# Patient Record
Sex: Male | Born: 1950 | Race: White | Hispanic: No | Smoking: Former smoker
Health system: Southern US, Community
[De-identification: ages and names within clinical notes are randomized; demographics above are authoritative.]

## PROBLEM LIST (undated history)

## (undated) DIAGNOSIS — I4510 Unspecified right bundle-branch block: Secondary | ICD-10-CM

## (undated) DIAGNOSIS — F419 Anxiety disorder, unspecified: Secondary | ICD-10-CM

## (undated) DIAGNOSIS — I2510 Atherosclerotic heart disease of native coronary artery without angina pectoris: Secondary | ICD-10-CM

## (undated) DIAGNOSIS — I252 Old myocardial infarction: Secondary | ICD-10-CM

## (undated) DIAGNOSIS — G4730 Sleep apnea, unspecified: Secondary | ICD-10-CM

## (undated) DIAGNOSIS — I219 Acute myocardial infarction, unspecified: Secondary | ICD-10-CM

## (undated) DIAGNOSIS — M7989 Other specified soft tissue disorders: Secondary | ICD-10-CM

## (undated) DIAGNOSIS — R0602 Shortness of breath: Secondary | ICD-10-CM

## (undated) DIAGNOSIS — E669 Obesity, unspecified: Secondary | ICD-10-CM

## (undated) DIAGNOSIS — I480 Paroxysmal atrial fibrillation: Secondary | ICD-10-CM

## (undated) DIAGNOSIS — F988 Other specified behavioral and emotional disorders with onset usually occurring in childhood and adolescence: Secondary | ICD-10-CM

## (undated) DIAGNOSIS — R0600 Dyspnea, unspecified: Secondary | ICD-10-CM

## (undated) DIAGNOSIS — R638 Other symptoms and signs concerning food and fluid intake: Secondary | ICD-10-CM

## (undated) DIAGNOSIS — I4891 Unspecified atrial fibrillation: Secondary | ICD-10-CM

## (undated) DIAGNOSIS — G4733 Obstructive sleep apnea (adult) (pediatric): Secondary | ICD-10-CM

## (undated) DIAGNOSIS — K219 Gastro-esophageal reflux disease without esophagitis: Secondary | ICD-10-CM

## (undated) DIAGNOSIS — M1990 Unspecified osteoarthritis, unspecified site: Secondary | ICD-10-CM

## (undated) DIAGNOSIS — I10 Essential (primary) hypertension: Secondary | ICD-10-CM

## (undated) DIAGNOSIS — R633 Feeding difficulties: Secondary | ICD-10-CM

## (undated) DIAGNOSIS — E119 Type 2 diabetes mellitus without complications: Secondary | ICD-10-CM

## (undated) DIAGNOSIS — K5900 Constipation, unspecified: Secondary | ICD-10-CM

## (undated) HISTORY — DX: Other specified soft tissue disorders: M79.89

## (undated) HISTORY — DX: Old myocardial infarction: I25.2

## (undated) HISTORY — DX: Unspecified right bundle-branch block: I45.10

## (undated) HISTORY — DX: Paroxysmal atrial fibrillation: I48.0

## (undated) HISTORY — DX: Obesity, unspecified: E66.9

## (undated) HISTORY — DX: Other specified behavioral and emotional disorders with onset usually occurring in childhood and adolescence: F98.8

## (undated) HISTORY — DX: Feeding difficulties: R63.3

## (undated) HISTORY — DX: Shortness of breath: R06.02

## (undated) HISTORY — DX: Constipation, unspecified: K59.00

## (undated) HISTORY — PX: CARDIAC CATHETERIZATION: SHX172

## (undated) HISTORY — DX: Obstructive sleep apnea (adult) (pediatric): G47.33

## (undated) HISTORY — PX: TONSILLECTOMY: SUR1361

## (undated) HISTORY — DX: Other symptoms and signs concerning food and fluid intake: R63.8

## (undated) HISTORY — DX: Essential (primary) hypertension: I10

## (undated) HISTORY — DX: Sleep apnea, unspecified: G47.30

---

## 2000-08-25 ENCOUNTER — Encounter: Payer: Self-pay | Admitting: Emergency Medicine

## 2000-08-25 ENCOUNTER — Emergency Department (HOSPITAL_COMMUNITY): Admission: EM | Admit: 2000-08-25 | Discharge: 2000-08-25 | Payer: Self-pay | Admitting: Emergency Medicine

## 2003-08-09 ENCOUNTER — Encounter: Admission: RE | Admit: 2003-08-09 | Discharge: 2003-08-09 | Payer: Self-pay | Admitting: Internal Medicine

## 2005-08-01 ENCOUNTER — Emergency Department (HOSPITAL_COMMUNITY): Admission: EM | Admit: 2005-08-01 | Discharge: 2005-08-01 | Payer: Self-pay | Admitting: Emergency Medicine

## 2005-09-21 HISTORY — PX: NM MYOVIEW LTD: HXRAD82

## 2007-04-02 ENCOUNTER — Emergency Department (HOSPITAL_COMMUNITY): Admission: EM | Admit: 2007-04-02 | Discharge: 2007-04-02 | Payer: Self-pay | Admitting: Emergency Medicine

## 2008-03-02 ENCOUNTER — Emergency Department (HOSPITAL_COMMUNITY): Admission: EM | Admit: 2008-03-02 | Discharge: 2008-03-02 | Payer: Self-pay | Admitting: Emergency Medicine

## 2008-09-21 HISTORY — PX: DOPPLER ECHOCARDIOGRAPHY: SHX263

## 2009-10-17 ENCOUNTER — Ambulatory Visit: Payer: Self-pay | Admitting: Family Medicine

## 2009-10-17 DIAGNOSIS — E669 Obesity, unspecified: Secondary | ICD-10-CM

## 2010-05-28 ENCOUNTER — Emergency Department (HOSPITAL_COMMUNITY): Admission: EM | Admit: 2010-05-28 | Discharge: 2010-05-28 | Payer: Self-pay | Admitting: Emergency Medicine

## 2010-10-23 NOTE — Assessment & Plan Note (Signed)
Summary: NEW PT TO SEE DR SYKES/[facility]   Vital Signs:  Patient profile:   60 year old male Height:      75 inches Weight:      308.3 pounds BMI:     38.67  Vitals Entered By: Wyona Almas PHD (October 17, 2009 2:10 PM)  History of Present Illness: Assessment: Spent at least 60 min w/ pt.  Usual eating pattern includes bkfast, lunch, and dinner, and frequent snacks.  Travels a lot, eats out a lot, and has an erratic schedule.  Rodman is worried about his BP, which has been very high recently, but he did not start his amlodipine Rx b/c he was concerned re. too many med's.  BP log showed SBPs in the 180s.  Usual daily intake includes 2-3 c green tea, 0-12 oz yogurt, and 10-12 light beers.  Hazael has never slept more than 4-5 hr/night, although he can fall asleep for as much as 2 hr some mornings after going out for breakfast.  Favorite foods include meat, cheese, and eggs.  Portion control is identified as problematic, as is frequent eating, i.e., eating from stress and/or boredom.  Demarrion is trying to work exercise into his week:  30 min on stationery bike and 15 min weights 3 X wk.    Nutrition Diagnosis:  Physical inactivity (NB-2.1) related to as lack of priority evidenced by exercise only recently begun, and maximal 45 min 3 X wk.  Excessive biioactive substance (alcohol) intake related to night-time beer drinking as evidenced by 10-12 beers/night.  Excessive energy intake (NI-1.5) related to beer intake and portion sizes as evidenced by 10-12 beers/night and self-reported large portions of numerous foods.    Intervention: See Ptaient Instructions.   Monitoring/Eval: [place] will call/email with progress report, and is scheduled for f/u appt in [facility] on Feb 17.      Other Orders: [facility]- Est Level  3 ([id number])  Patient Instructions: 1)  Start your amlodipine Rx!   2)  Work on increasing your sleep time: Aim for 1/2 hour earlier bedtime each week.  If you wake up early, then do your best  to stay in bed until at least 6 AM each day.  3)  Google "sleep techniques," and try some of them.    4)  Wean yourself down to two beers per night: Establish a plan for this.   5)  Eat at least 3 meals and 1-2 snacks per day. No more than 5 hours between eating.  6)  Breakfast should include a significant amount of protein and adequate calories.  (Protein foods include meat, fish, poultry, eggs, dairy, soy foods, and beans.)   7)  Continue to log your food intake, AND REVIEW REGULARLY.   8)  Follow-up:  Email or call progress report:    [email or web address] / [phone].

## 2014-01-29 DIAGNOSIS — E119 Type 2 diabetes mellitus without complications: Secondary | ICD-10-CM | POA: Diagnosis not present

## 2014-01-29 DIAGNOSIS — Z125 Encounter for screening for malignant neoplasm of prostate: Secondary | ICD-10-CM | POA: Diagnosis not present

## 2014-01-29 DIAGNOSIS — I10 Essential (primary) hypertension: Secondary | ICD-10-CM | POA: Diagnosis not present

## 2014-01-29 DIAGNOSIS — E782 Mixed hyperlipidemia: Secondary | ICD-10-CM | POA: Diagnosis not present

## 2014-02-05 DIAGNOSIS — E782 Mixed hyperlipidemia: Secondary | ICD-10-CM | POA: Diagnosis not present

## 2014-02-05 DIAGNOSIS — Z0000 Encounter for general adult medical examination without abnormal findings: Secondary | ICD-10-CM | POA: Diagnosis not present

## 2014-02-05 DIAGNOSIS — G4733 Obstructive sleep apnea (adult) (pediatric): Secondary | ICD-10-CM | POA: Diagnosis not present

## 2014-02-05 DIAGNOSIS — Z125 Encounter for screening for malignant neoplasm of prostate: Secondary | ICD-10-CM | POA: Diagnosis not present

## 2014-02-05 DIAGNOSIS — I10 Essential (primary) hypertension: Secondary | ICD-10-CM | POA: Diagnosis not present

## 2014-02-05 DIAGNOSIS — R609 Edema, unspecified: Secondary | ICD-10-CM | POA: Diagnosis not present

## 2014-02-05 DIAGNOSIS — Z79899 Other long term (current) drug therapy: Secondary | ICD-10-CM | POA: Diagnosis not present

## 2014-02-05 DIAGNOSIS — E1159 Type 2 diabetes mellitus with other circulatory complications: Secondary | ICD-10-CM | POA: Diagnosis not present

## 2014-02-05 DIAGNOSIS — I739 Peripheral vascular disease, unspecified: Secondary | ICD-10-CM | POA: Diagnosis not present

## 2014-02-06 DIAGNOSIS — Z1212 Encounter for screening for malignant neoplasm of rectum: Secondary | ICD-10-CM | POA: Diagnosis not present

## 2014-05-30 DIAGNOSIS — E1159 Type 2 diabetes mellitus with other circulatory complications: Secondary | ICD-10-CM | POA: Diagnosis not present

## 2014-05-30 DIAGNOSIS — G4733 Obstructive sleep apnea (adult) (pediatric): Secondary | ICD-10-CM | POA: Diagnosis not present

## 2014-05-30 DIAGNOSIS — Z6839 Body mass index (BMI) 39.0-39.9, adult: Secondary | ICD-10-CM | POA: Diagnosis not present

## 2014-05-30 DIAGNOSIS — I10 Essential (primary) hypertension: Secondary | ICD-10-CM | POA: Diagnosis not present

## 2014-05-30 DIAGNOSIS — R0789 Other chest pain: Secondary | ICD-10-CM | POA: Diagnosis not present

## 2014-05-30 DIAGNOSIS — I739 Peripheral vascular disease, unspecified: Secondary | ICD-10-CM | POA: Diagnosis not present

## 2014-06-01 ENCOUNTER — Ambulatory Visit: Payer: Self-pay | Admitting: Physician Assistant

## 2014-06-14 ENCOUNTER — Encounter: Payer: Self-pay | Admitting: Cardiovascular Disease

## 2014-06-14 ENCOUNTER — Ambulatory Visit (INDEPENDENT_AMBULATORY_CARE_PROVIDER_SITE_OTHER): Payer: Medicare Other | Admitting: Cardiovascular Disease

## 2014-06-14 VITALS — BP 178/94 | HR 84 | Ht 74.0 in | Wt 299.0 lb

## 2014-06-14 DIAGNOSIS — I4510 Unspecified right bundle-branch block: Secondary | ICD-10-CM | POA: Diagnosis not present

## 2014-06-14 DIAGNOSIS — I10 Essential (primary) hypertension: Secondary | ICD-10-CM | POA: Diagnosis not present

## 2014-06-14 DIAGNOSIS — G4733 Obstructive sleep apnea (adult) (pediatric): Secondary | ICD-10-CM | POA: Diagnosis not present

## 2014-06-14 DIAGNOSIS — R633 Feeding difficulties, unspecified: Secondary | ICD-10-CM | POA: Diagnosis not present

## 2014-06-14 DIAGNOSIS — E669 Obesity, unspecified: Secondary | ICD-10-CM

## 2014-06-14 DIAGNOSIS — R638 Other symptoms and signs concerning food and fluid intake: Secondary | ICD-10-CM | POA: Insufficient documentation

## 2014-06-14 NOTE — Assessment & Plan Note (Signed)
On Diovan hydrochlorothiazide with elevated blood pressures today. He does admit to dietary indiscretion with regard to salt.
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
## 2014-06-14 NOTE — Assessment & Plan Note (Signed)
I last saw David Frost  6 years ago. At that point he weighed 300 pounds. He currently weighs 299 pounds. He is aware of the importance of diet and exercise.

## 2014-06-14 NOTE — Patient Instructions (Signed)
Your physician wants you to follow-up in: 1 year with Dr Berry. You will receive a reminder letter in the mail two months in advance. If you don't receive a letter, please call our office to schedule the follow-up appointment.  

## 2014-08-08 DIAGNOSIS — Z6838 Body mass index (BMI) 38.0-38.9, adult: Secondary | ICD-10-CM | POA: Diagnosis not present

## 2014-08-08 DIAGNOSIS — I739 Peripheral vascular disease, unspecified: Secondary | ICD-10-CM | POA: Diagnosis not present

## 2014-08-08 DIAGNOSIS — I10 Essential (primary) hypertension: Secondary | ICD-10-CM | POA: Diagnosis not present

## 2014-08-08 DIAGNOSIS — G4733 Obstructive sleep apnea (adult) (pediatric): Secondary | ICD-10-CM | POA: Diagnosis not present

## 2014-08-08 DIAGNOSIS — E782 Mixed hyperlipidemia: Secondary | ICD-10-CM | POA: Diagnosis not present

## 2014-08-08 DIAGNOSIS — E1151 Type 2 diabetes mellitus with diabetic peripheral angiopathy without gangrene: Secondary | ICD-10-CM | POA: Diagnosis not present

## 2015-01-08 ENCOUNTER — Encounter: Payer: Self-pay | Admitting: *Deleted

## 2015-01-15 DIAGNOSIS — D485 Neoplasm of uncertain behavior of skin: Secondary | ICD-10-CM | POA: Diagnosis not present

## 2015-01-15 DIAGNOSIS — D2122 Benign neoplasm of connective and other soft tissue of left lower limb, including hip: Secondary | ICD-10-CM | POA: Diagnosis not present

## 2015-01-15 DIAGNOSIS — L918 Other hypertrophic disorders of the skin: Secondary | ICD-10-CM | POA: Diagnosis not present

## 2015-01-15 DIAGNOSIS — L821 Other seborrheic keratosis: Secondary | ICD-10-CM | POA: Diagnosis not present

## 2015-01-28 DIAGNOSIS — H04123 Dry eye syndrome of bilateral lacrimal glands: Secondary | ICD-10-CM | POA: Diagnosis not present

## 2015-01-28 DIAGNOSIS — H40053 Ocular hypertension, bilateral: Secondary | ICD-10-CM | POA: Diagnosis not present

## 2015-01-28 DIAGNOSIS — H2513 Age-related nuclear cataract, bilateral: Secondary | ICD-10-CM | POA: Diagnosis not present

## 2015-01-28 DIAGNOSIS — H35371 Puckering of macula, right eye: Secondary | ICD-10-CM | POA: Diagnosis not present

## 2015-02-12 ENCOUNTER — Encounter: Payer: Self-pay | Admitting: Cardiovascular Disease

## 2015-08-06 DIAGNOSIS — H40053 Ocular hypertension, bilateral: Secondary | ICD-10-CM | POA: Diagnosis not present

## 2015-08-06 DIAGNOSIS — H2513 Age-related nuclear cataract, bilateral: Secondary | ICD-10-CM | POA: Diagnosis not present

## 2015-08-06 DIAGNOSIS — H04123 Dry eye syndrome of bilateral lacrimal glands: Secondary | ICD-10-CM | POA: Diagnosis not present

## 2015-08-09 DIAGNOSIS — Z6839 Body mass index (BMI) 39.0-39.9, adult: Secondary | ICD-10-CM | POA: Diagnosis not present

## 2015-08-09 DIAGNOSIS — E1151 Type 2 diabetes mellitus with diabetic peripheral angiopathy without gangrene: Secondary | ICD-10-CM | POA: Diagnosis not present

## 2015-08-09 DIAGNOSIS — J209 Acute bronchitis, unspecified: Secondary | ICD-10-CM | POA: Diagnosis not present

## 2015-08-09 DIAGNOSIS — I739 Peripheral vascular disease, unspecified: Secondary | ICD-10-CM | POA: Diagnosis not present

## 2015-09-10 ENCOUNTER — Telehealth: Payer: Self-pay | Admitting: Cardiovascular Disease

## 2015-09-10 NOTE — Telephone Encounter (Signed)
Called & spoke to patient. He notes chest congestion, URI symptoms and he is on antibiotics & adjuncts prescribed by PCP. He states this has been for a few days, he has had chest soreness for last 2 days. Strong suspicion this is from coughing, he also fell a few days ago and feels like he has rib contusion. He is a little SOB but attributes to illness.  Advised ER if SOB/CP worse or if notes other symptoms (i.e. Arm pain, neck/jaw pain). Pt voiced understanding & agreement w/ this plan. Advised to call if further needs.  He is also overdue for office visit - he declined to schedule today and will call when feeling better.

## 2015-09-10 NOTE — Telephone Encounter (Signed)
Mr.Brevada is calling because he is having some pressure in his chest and wants to know if he can come in for an EKG. Please call   Thanks

## 2015-09-26 DIAGNOSIS — K219 Gastro-esophageal reflux disease without esophagitis: Secondary | ICD-10-CM | POA: Diagnosis not present

## 2015-09-26 DIAGNOSIS — E1151 Type 2 diabetes mellitus with diabetic peripheral angiopathy without gangrene: Secondary | ICD-10-CM | POA: Diagnosis not present

## 2015-09-26 DIAGNOSIS — R05 Cough: Secondary | ICD-10-CM | POA: Diagnosis not present

## 2015-09-26 DIAGNOSIS — J209 Acute bronchitis, unspecified: Secondary | ICD-10-CM | POA: Diagnosis not present

## 2015-09-26 DIAGNOSIS — Z6841 Body Mass Index (BMI) 40.0 and over, adult: Secondary | ICD-10-CM | POA: Diagnosis not present

## 2015-10-15 DIAGNOSIS — R05 Cough: Secondary | ICD-10-CM | POA: Diagnosis not present

## 2015-10-15 DIAGNOSIS — R918 Other nonspecific abnormal finding of lung field: Secondary | ICD-10-CM | POA: Diagnosis not present

## 2015-10-29 DIAGNOSIS — I10 Essential (primary) hypertension: Secondary | ICD-10-CM | POA: Diagnosis not present

## 2015-10-29 DIAGNOSIS — Z125 Encounter for screening for malignant neoplasm of prostate: Secondary | ICD-10-CM | POA: Diagnosis not present

## 2015-10-29 DIAGNOSIS — E782 Mixed hyperlipidemia: Secondary | ICD-10-CM | POA: Diagnosis not present

## 2015-10-29 DIAGNOSIS — E1151 Type 2 diabetes mellitus with diabetic peripheral angiopathy without gangrene: Secondary | ICD-10-CM | POA: Diagnosis not present

## 2015-11-05 DIAGNOSIS — I7389 Other specified peripheral vascular diseases: Secondary | ICD-10-CM | POA: Diagnosis not present

## 2015-11-05 DIAGNOSIS — Z0000 Encounter for general adult medical examination without abnormal findings: Secondary | ICD-10-CM | POA: Diagnosis not present

## 2015-11-05 DIAGNOSIS — E782 Mixed hyperlipidemia: Secondary | ICD-10-CM | POA: Diagnosis not present

## 2015-11-05 DIAGNOSIS — R05 Cough: Secondary | ICD-10-CM | POA: Diagnosis not present

## 2015-11-05 DIAGNOSIS — Z1389 Encounter for screening for other disorder: Secondary | ICD-10-CM | POA: Diagnosis not present

## 2015-11-05 DIAGNOSIS — Z6841 Body Mass Index (BMI) 40.0 and over, adult: Secondary | ICD-10-CM | POA: Diagnosis not present

## 2015-11-05 DIAGNOSIS — I10 Essential (primary) hypertension: Secondary | ICD-10-CM | POA: Diagnosis not present

## 2015-11-05 DIAGNOSIS — G4733 Obstructive sleep apnea (adult) (pediatric): Secondary | ICD-10-CM | POA: Diagnosis not present

## 2015-11-05 DIAGNOSIS — E1151 Type 2 diabetes mellitus with diabetic peripheral angiopathy without gangrene: Secondary | ICD-10-CM | POA: Diagnosis not present

## 2015-11-13 DIAGNOSIS — Z1212 Encounter for screening for malignant neoplasm of rectum: Secondary | ICD-10-CM | POA: Diagnosis not present

## 2015-12-12 DIAGNOSIS — Z6841 Body Mass Index (BMI) 40.0 and over, adult: Secondary | ICD-10-CM | POA: Diagnosis not present

## 2015-12-12 DIAGNOSIS — H9313 Tinnitus, bilateral: Secondary | ICD-10-CM | POA: Diagnosis not present

## 2015-12-12 DIAGNOSIS — I10 Essential (primary) hypertension: Secondary | ICD-10-CM | POA: Diagnosis not present

## 2015-12-12 DIAGNOSIS — E1151 Type 2 diabetes mellitus with diabetic peripheral angiopathy without gangrene: Secondary | ICD-10-CM | POA: Diagnosis not present

## 2015-12-19 DIAGNOSIS — H903 Sensorineural hearing loss, bilateral: Secondary | ICD-10-CM | POA: Diagnosis not present

## 2015-12-19 DIAGNOSIS — H9313 Tinnitus, bilateral: Secondary | ICD-10-CM | POA: Diagnosis not present

## 2015-12-26 DIAGNOSIS — J320 Chronic maxillary sinusitis: Secondary | ICD-10-CM | POA: Diagnosis not present

## 2015-12-26 DIAGNOSIS — H903 Sensorineural hearing loss, bilateral: Secondary | ICD-10-CM | POA: Diagnosis not present

## 2015-12-26 DIAGNOSIS — H8103 Meniere's disease, bilateral: Secondary | ICD-10-CM | POA: Diagnosis not present

## 2015-12-26 DIAGNOSIS — H9312 Tinnitus, left ear: Secondary | ICD-10-CM | POA: Diagnosis not present

## 2015-12-26 DIAGNOSIS — H8143 Vertigo of central origin, bilateral: Secondary | ICD-10-CM | POA: Diagnosis not present

## 2015-12-26 DIAGNOSIS — J322 Chronic ethmoidal sinusitis: Secondary | ICD-10-CM | POA: Diagnosis not present

## 2015-12-31 DIAGNOSIS — H6122 Impacted cerumen, left ear: Secondary | ICD-10-CM | POA: Diagnosis not present

## 2015-12-31 DIAGNOSIS — H903 Sensorineural hearing loss, bilateral: Secondary | ICD-10-CM | POA: Diagnosis not present

## 2015-12-31 DIAGNOSIS — H8103 Meniere's disease, bilateral: Secondary | ICD-10-CM | POA: Diagnosis not present

## 2015-12-31 DIAGNOSIS — H9313 Tinnitus, bilateral: Secondary | ICD-10-CM | POA: Diagnosis not present

## 2015-12-31 DIAGNOSIS — H8143 Vertigo of central origin, bilateral: Secondary | ICD-10-CM | POA: Diagnosis not present

## 2016-01-07 DIAGNOSIS — H9313 Tinnitus, bilateral: Secondary | ICD-10-CM | POA: Diagnosis not present

## 2016-01-07 DIAGNOSIS — H8103 Meniere's disease, bilateral: Secondary | ICD-10-CM | POA: Diagnosis not present

## 2016-01-07 DIAGNOSIS — H903 Sensorineural hearing loss, bilateral: Secondary | ICD-10-CM | POA: Diagnosis not present

## 2016-01-20 DIAGNOSIS — H9313 Tinnitus, bilateral: Secondary | ICD-10-CM | POA: Diagnosis not present

## 2016-01-20 DIAGNOSIS — H903 Sensorineural hearing loss, bilateral: Secondary | ICD-10-CM | POA: Diagnosis not present

## 2016-01-28 DIAGNOSIS — H9313 Tinnitus, bilateral: Secondary | ICD-10-CM | POA: Diagnosis not present

## 2016-03-12 DIAGNOSIS — I10 Essential (primary) hypertension: Secondary | ICD-10-CM | POA: Diagnosis not present

## 2016-03-12 DIAGNOSIS — E1151 Type 2 diabetes mellitus with diabetic peripheral angiopathy without gangrene: Secondary | ICD-10-CM | POA: Diagnosis not present

## 2016-03-12 DIAGNOSIS — E782 Mixed hyperlipidemia: Secondary | ICD-10-CM | POA: Diagnosis not present

## 2016-03-12 DIAGNOSIS — G4733 Obstructive sleep apnea (adult) (pediatric): Secondary | ICD-10-CM | POA: Diagnosis not present

## 2016-03-12 DIAGNOSIS — Z6838 Body mass index (BMI) 38.0-38.9, adult: Secondary | ICD-10-CM | POA: Diagnosis not present

## 2016-03-12 DIAGNOSIS — I7389 Other specified peripheral vascular diseases: Secondary | ICD-10-CM | POA: Diagnosis not present

## 2016-03-28 DIAGNOSIS — G4733 Obstructive sleep apnea (adult) (pediatric): Secondary | ICD-10-CM | POA: Diagnosis not present

## 2016-03-30 DIAGNOSIS — G4733 Obstructive sleep apnea (adult) (pediatric): Secondary | ICD-10-CM | POA: Diagnosis not present

## 2016-05-06 DIAGNOSIS — L720 Epidermal cyst: Secondary | ICD-10-CM | POA: Diagnosis not present

## 2016-05-06 DIAGNOSIS — D485 Neoplasm of uncertain behavior of skin: Secondary | ICD-10-CM | POA: Diagnosis not present

## 2016-05-06 DIAGNOSIS — I8312 Varicose veins of left lower extremity with inflammation: Secondary | ICD-10-CM | POA: Diagnosis not present

## 2016-05-06 DIAGNOSIS — L820 Inflamed seborrheic keratosis: Secondary | ICD-10-CM | POA: Diagnosis not present

## 2016-05-06 DIAGNOSIS — B351 Tinea unguium: Secondary | ICD-10-CM | POA: Diagnosis not present

## 2016-05-06 DIAGNOSIS — I8311 Varicose veins of right lower extremity with inflammation: Secondary | ICD-10-CM | POA: Diagnosis not present

## 2016-05-10 DIAGNOSIS — S61210A Laceration without foreign body of right index finger without damage to nail, initial encounter: Secondary | ICD-10-CM | POA: Diagnosis not present

## 2016-05-10 DIAGNOSIS — Z23 Encounter for immunization: Secondary | ICD-10-CM | POA: Diagnosis not present

## 2016-06-02 DIAGNOSIS — J40 Bronchitis, not specified as acute or chronic: Secondary | ICD-10-CM | POA: Diagnosis not present

## 2016-06-02 DIAGNOSIS — I10 Essential (primary) hypertension: Secondary | ICD-10-CM | POA: Diagnosis not present

## 2016-06-02 DIAGNOSIS — E782 Mixed hyperlipidemia: Secondary | ICD-10-CM | POA: Diagnosis not present

## 2016-06-02 DIAGNOSIS — E119 Type 2 diabetes mellitus without complications: Secondary | ICD-10-CM | POA: Diagnosis not present

## 2016-06-08 DIAGNOSIS — I7389 Other specified peripheral vascular diseases: Secondary | ICD-10-CM | POA: Diagnosis not present

## 2016-06-08 DIAGNOSIS — E1151 Type 2 diabetes mellitus with diabetic peripheral angiopathy without gangrene: Secondary | ICD-10-CM | POA: Diagnosis not present

## 2016-06-08 DIAGNOSIS — G4733 Obstructive sleep apnea (adult) (pediatric): Secondary | ICD-10-CM | POA: Diagnosis not present

## 2016-06-08 DIAGNOSIS — Z6839 Body mass index (BMI) 39.0-39.9, adult: Secondary | ICD-10-CM | POA: Diagnosis not present

## 2016-06-08 DIAGNOSIS — I10 Essential (primary) hypertension: Secondary | ICD-10-CM | POA: Diagnosis not present

## 2016-06-08 DIAGNOSIS — E782 Mixed hyperlipidemia: Secondary | ICD-10-CM | POA: Diagnosis not present

## 2016-06-22 DIAGNOSIS — Z6839 Body mass index (BMI) 39.0-39.9, adult: Secondary | ICD-10-CM | POA: Diagnosis not present

## 2016-06-22 DIAGNOSIS — I10 Essential (primary) hypertension: Secondary | ICD-10-CM | POA: Diagnosis not present

## 2016-06-22 DIAGNOSIS — K219 Gastro-esophageal reflux disease without esophagitis: Secondary | ICD-10-CM | POA: Diagnosis not present

## 2016-07-16 DIAGNOSIS — E1151 Type 2 diabetes mellitus with diabetic peripheral angiopathy without gangrene: Secondary | ICD-10-CM | POA: Diagnosis not present

## 2016-07-16 DIAGNOSIS — Z6839 Body mass index (BMI) 39.0-39.9, adult: Secondary | ICD-10-CM | POA: Diagnosis not present

## 2016-07-16 DIAGNOSIS — L0889 Other specified local infections of the skin and subcutaneous tissue: Secondary | ICD-10-CM | POA: Diagnosis not present

## 2016-09-28 DIAGNOSIS — M9903 Segmental and somatic dysfunction of lumbar region: Secondary | ICD-10-CM | POA: Diagnosis not present

## 2016-09-28 DIAGNOSIS — M9904 Segmental and somatic dysfunction of sacral region: Secondary | ICD-10-CM | POA: Diagnosis not present

## 2016-09-28 DIAGNOSIS — M5430 Sciatica, unspecified side: Secondary | ICD-10-CM | POA: Diagnosis not present

## 2016-09-28 DIAGNOSIS — M9902 Segmental and somatic dysfunction of thoracic region: Secondary | ICD-10-CM | POA: Diagnosis not present

## 2016-10-20 DIAGNOSIS — M48062 Spinal stenosis, lumbar region with neurogenic claudication: Secondary | ICD-10-CM | POA: Diagnosis not present

## 2016-10-20 DIAGNOSIS — M549 Dorsalgia, unspecified: Secondary | ICD-10-CM | POA: Diagnosis not present

## 2016-10-20 DIAGNOSIS — M47816 Spondylosis without myelopathy or radiculopathy, lumbar region: Secondary | ICD-10-CM | POA: Diagnosis not present

## 2016-10-20 DIAGNOSIS — I10 Essential (primary) hypertension: Secondary | ICD-10-CM | POA: Diagnosis not present

## 2016-10-20 DIAGNOSIS — M5136 Other intervertebral disc degeneration, lumbar region: Secondary | ICD-10-CM | POA: Diagnosis not present

## 2016-10-20 DIAGNOSIS — Z6841 Body Mass Index (BMI) 40.0 and over, adult: Secondary | ICD-10-CM | POA: Diagnosis not present

## 2016-10-20 DIAGNOSIS — M4316 Spondylolisthesis, lumbar region: Secondary | ICD-10-CM | POA: Diagnosis not present

## 2016-10-20 DIAGNOSIS — M546 Pain in thoracic spine: Secondary | ICD-10-CM | POA: Diagnosis not present

## 2016-11-10 DIAGNOSIS — M48062 Spinal stenosis, lumbar region with neurogenic claudication: Secondary | ICD-10-CM | POA: Diagnosis not present

## 2016-11-10 DIAGNOSIS — M4316 Spondylolisthesis, lumbar region: Secondary | ICD-10-CM | POA: Diagnosis not present

## 2016-11-10 DIAGNOSIS — M5126 Other intervertebral disc displacement, lumbar region: Secondary | ICD-10-CM | POA: Diagnosis not present

## 2016-11-16 DIAGNOSIS — M4316 Spondylolisthesis, lumbar region: Secondary | ICD-10-CM | POA: Diagnosis not present

## 2016-11-16 DIAGNOSIS — Z6841 Body Mass Index (BMI) 40.0 and over, adult: Secondary | ICD-10-CM | POA: Diagnosis not present

## 2016-11-16 DIAGNOSIS — M48062 Spinal stenosis, lumbar region with neurogenic claudication: Secondary | ICD-10-CM | POA: Diagnosis not present

## 2016-11-16 DIAGNOSIS — M47816 Spondylosis without myelopathy or radiculopathy, lumbar region: Secondary | ICD-10-CM | POA: Diagnosis not present

## 2016-11-16 DIAGNOSIS — I10 Essential (primary) hypertension: Secondary | ICD-10-CM | POA: Diagnosis not present

## 2016-11-16 DIAGNOSIS — M5136 Other intervertebral disc degeneration, lumbar region: Secondary | ICD-10-CM | POA: Diagnosis not present

## 2017-03-15 DIAGNOSIS — F17200 Nicotine dependence, unspecified, uncomplicated: Secondary | ICD-10-CM | POA: Diagnosis not present

## 2017-03-15 DIAGNOSIS — I214 Non-ST elevation (NSTEMI) myocardial infarction: Secondary | ICD-10-CM | POA: Diagnosis not present

## 2017-03-15 DIAGNOSIS — R0789 Other chest pain: Secondary | ICD-10-CM | POA: Diagnosis not present

## 2017-03-15 DIAGNOSIS — I10 Essential (primary) hypertension: Secondary | ICD-10-CM | POA: Diagnosis not present

## 2017-03-15 DIAGNOSIS — I4510 Unspecified right bundle-branch block: Secondary | ICD-10-CM | POA: Diagnosis not present

## 2017-03-15 DIAGNOSIS — E785 Hyperlipidemia, unspecified: Secondary | ICD-10-CM | POA: Diagnosis not present

## 2017-03-15 DIAGNOSIS — R079 Chest pain, unspecified: Secondary | ICD-10-CM | POA: Diagnosis not present

## 2017-03-15 DIAGNOSIS — E119 Type 2 diabetes mellitus without complications: Secondary | ICD-10-CM | POA: Diagnosis not present

## 2017-03-16 DIAGNOSIS — I214 Non-ST elevation (NSTEMI) myocardial infarction: Secondary | ICD-10-CM | POA: Diagnosis not present

## 2017-03-16 DIAGNOSIS — E785 Hyperlipidemia, unspecified: Secondary | ICD-10-CM | POA: Diagnosis not present

## 2017-03-16 DIAGNOSIS — I4510 Unspecified right bundle-branch block: Secondary | ICD-10-CM | POA: Diagnosis not present

## 2017-03-16 DIAGNOSIS — E669 Obesity, unspecified: Secondary | ICD-10-CM | POA: Diagnosis not present

## 2017-03-16 DIAGNOSIS — E119 Type 2 diabetes mellitus without complications: Secondary | ICD-10-CM | POA: Diagnosis not present

## 2017-03-16 DIAGNOSIS — I10 Essential (primary) hypertension: Secondary | ICD-10-CM | POA: Diagnosis not present

## 2017-03-17 ENCOUNTER — Encounter (HOSPITAL_COMMUNITY): Payer: Self-pay | Admitting: General Practice

## 2017-03-17 ENCOUNTER — Telehealth: Payer: Self-pay | Admitting: Cardiovascular Disease

## 2017-03-17 ENCOUNTER — Inpatient Hospital Stay (HOSPITAL_COMMUNITY)
Admission: AD | Admit: 2017-03-17 | Discharge: 2017-03-27 | DRG: 236 | Disposition: A | Discharge status: Home Health | Payer: Medicare Other | Source: Other Acute Inpatient Hospital | Attending: Internal Medicine | Admitting: Internal Medicine

## 2017-03-17 DIAGNOSIS — E785 Hyperlipidemia, unspecified: Secondary | ICD-10-CM | POA: Diagnosis present

## 2017-03-17 DIAGNOSIS — G4733 Obstructive sleep apnea (adult) (pediatric): Secondary | ICD-10-CM | POA: Diagnosis present

## 2017-03-17 DIAGNOSIS — I440 Atrioventricular block, first degree: Secondary | ICD-10-CM | POA: Diagnosis present

## 2017-03-17 DIAGNOSIS — E8770 Fluid overload, unspecified: Secondary | ICD-10-CM | POA: Diagnosis present

## 2017-03-17 DIAGNOSIS — Z6838 Body mass index (BMI) 38.0-38.9, adult: Secondary | ICD-10-CM | POA: Diagnosis not present

## 2017-03-17 DIAGNOSIS — E119 Type 2 diabetes mellitus without complications: Secondary | ICD-10-CM | POA: Diagnosis not present

## 2017-03-17 DIAGNOSIS — I4510 Unspecified right bundle-branch block: Secondary | ICD-10-CM | POA: Diagnosis not present

## 2017-03-17 DIAGNOSIS — E669 Obesity, unspecified: Secondary | ICD-10-CM | POA: Diagnosis not present

## 2017-03-17 DIAGNOSIS — Z951 Presence of aortocoronary bypass graft: Secondary | ICD-10-CM

## 2017-03-17 DIAGNOSIS — E871 Hypo-osmolality and hyponatremia: Secondary | ICD-10-CM | POA: Diagnosis present

## 2017-03-17 DIAGNOSIS — I10 Essential (primary) hypertension: Secondary | ICD-10-CM | POA: Diagnosis present

## 2017-03-17 DIAGNOSIS — F1010 Alcohol abuse, uncomplicated: Secondary | ICD-10-CM | POA: Diagnosis present

## 2017-03-17 DIAGNOSIS — I25110 Atherosclerotic heart disease of native coronary artery with unstable angina pectoris: Secondary | ICD-10-CM | POA: Diagnosis not present

## 2017-03-17 DIAGNOSIS — F419 Anxiety disorder, unspecified: Secondary | ICD-10-CM | POA: Diagnosis present

## 2017-03-17 DIAGNOSIS — Z79899 Other long term (current) drug therapy: Secondary | ICD-10-CM

## 2017-03-17 DIAGNOSIS — J9811 Atelectasis: Secondary | ICD-10-CM | POA: Diagnosis not present

## 2017-03-17 DIAGNOSIS — Z09 Encounter for follow-up examination after completed treatment for conditions other than malignant neoplasm: Secondary | ICD-10-CM

## 2017-03-17 DIAGNOSIS — I350 Nonrheumatic aortic (valve) stenosis: Secondary | ICD-10-CM | POA: Diagnosis not present

## 2017-03-17 DIAGNOSIS — E872 Acidosis: Secondary | ICD-10-CM | POA: Diagnosis not present

## 2017-03-17 DIAGNOSIS — K219 Gastro-esophageal reflux disease without esophagitis: Secondary | ICD-10-CM | POA: Diagnosis present

## 2017-03-17 DIAGNOSIS — Z01811 Encounter for preprocedural respiratory examination: Secondary | ICD-10-CM

## 2017-03-17 DIAGNOSIS — Z8249 Family history of ischemic heart disease and other diseases of the circulatory system: Secondary | ICD-10-CM | POA: Diagnosis not present

## 2017-03-17 DIAGNOSIS — I2510 Atherosclerotic heart disease of native coronary artery without angina pectoris: Secondary | ICD-10-CM | POA: Diagnosis not present

## 2017-03-17 DIAGNOSIS — K5900 Constipation, unspecified: Secondary | ICD-10-CM | POA: Diagnosis not present

## 2017-03-17 DIAGNOSIS — Z7982 Long term (current) use of aspirin: Secondary | ICD-10-CM

## 2017-03-17 DIAGNOSIS — I214 Non-ST elevation (NSTEMI) myocardial infarction: Secondary | ICD-10-CM | POA: Diagnosis not present

## 2017-03-17 DIAGNOSIS — I517 Cardiomegaly: Secondary | ICD-10-CM | POA: Diagnosis not present

## 2017-03-17 DIAGNOSIS — D62 Acute posthemorrhagic anemia: Secondary | ICD-10-CM | POA: Diagnosis not present

## 2017-03-17 DIAGNOSIS — Z01810 Encounter for preprocedural cardiovascular examination: Secondary | ICD-10-CM | POA: Diagnosis not present

## 2017-03-17 DIAGNOSIS — J810 Acute pulmonary edema: Secondary | ICD-10-CM | POA: Diagnosis not present

## 2017-03-17 DIAGNOSIS — J90 Pleural effusion, not elsewhere classified: Secondary | ICD-10-CM | POA: Diagnosis not present

## 2017-03-17 DIAGNOSIS — Z8679 Personal history of other diseases of the circulatory system: Secondary | ICD-10-CM | POA: Diagnosis not present

## 2017-03-17 DIAGNOSIS — F17290 Nicotine dependence, other tobacco product, uncomplicated: Secondary | ICD-10-CM | POA: Diagnosis present

## 2017-03-17 DIAGNOSIS — Z9689 Presence of other specified functional implants: Secondary | ICD-10-CM

## 2017-03-17 DIAGNOSIS — R079 Chest pain, unspecified: Secondary | ICD-10-CM | POA: Diagnosis not present

## 2017-03-17 HISTORY — DX: Gastro-esophageal reflux disease without esophagitis: K21.9

## 2017-03-17 HISTORY — DX: Atherosclerotic heart disease of native coronary artery without angina pectoris: I25.10

## 2017-03-17 HISTORY — DX: Anxiety disorder, unspecified: F41.9

## 2017-03-17 HISTORY — DX: Dyspnea, unspecified: R06.00

## 2017-03-17 HISTORY — DX: Type 2 diabetes mellitus without complications: E11.9

## 2017-03-17 LAB — CREATININE, SERUM: CREATININE: 0.8 mg/dL (ref 0.61–1.24)

## 2017-03-17 MED ORDER — LORAZEPAM 1 MG PO TABS
0.0000 mg | ORAL_TABLET | Freq: Two times a day (BID) | ORAL | Status: AC
Start: 2017-03-19 — End: 2017-03-21
  Administered 2017-03-20 – 2017-03-21 (×3): 1 mg via ORAL
  Filled 2017-03-17 (×5): qty 1

## 2017-03-17 MED ORDER — ASPIRIN EC 81 MG PO TBEC
81.0000 mg | DELAYED_RELEASE_TABLET | Freq: Every day | ORAL | Status: DC
  Administered 2017-03-17 – 2017-03-21 (×5): 81 mg via ORAL
  Filled 2017-03-17 (×5): qty 1

## 2017-03-17 MED ORDER — ACETAMINOPHEN 325 MG PO TABS: 650.0000 mg | ORAL_TABLET | ORAL | Status: DC | PRN

## 2017-03-17 MED ORDER — BRIMONIDINE TARTRATE 0.2 % OP SOLN
1.0000 [drp] | Freq: Two times a day (BID) | OPHTHALMIC | Status: DC
  Administered 2017-03-19 – 2017-03-26 (×13): 1 [drp] via OPHTHALMIC
  Filled 2017-03-17 (×2): qty 5

## 2017-03-17 MED ORDER — HEPARIN SODIUM (PORCINE) 5000 UNIT/ML IJ SOLN
5000.0000 [IU] | Freq: Three times a day (TID) | INTRAMUSCULAR | Status: DC
  Administered 2017-03-17 – 2017-03-18 (×2): 5000 [IU] via SUBCUTANEOUS
  Filled 2017-03-17 (×2): qty 1

## 2017-03-17 MED ORDER — HYDROCHLOROTHIAZIDE 25 MG PO TABS
25.0000 mg | ORAL_TABLET | Freq: Every day | ORAL | Status: DC
  Administered 2017-03-18 – 2017-03-23 (×5): 25 mg via ORAL
  Filled 2017-03-17 (×6): qty 1

## 2017-03-17 MED ORDER — INSULIN ASPART 100 UNIT/ML ~~LOC~~ SOLN
0.0000 [IU] | Freq: Three times a day (TID) | SUBCUTANEOUS | Status: DC
  Administered 2017-03-18 – 2017-03-19 (×3): 4 [IU] via SUBCUTANEOUS
  Administered 2017-03-19 (×2): 3 [IU] via SUBCUTANEOUS
  Administered 2017-03-20: 4 [IU] via SUBCUTANEOUS
  Administered 2017-03-20: 3 [IU] via SUBCUTANEOUS
  Administered 2017-03-21: 4 [IU] via SUBCUTANEOUS
  Administered 2017-03-21: 3 [IU] via SUBCUTANEOUS

## 2017-03-17 MED ORDER — LORAZEPAM 2 MG/ML IJ SOLN: 1.0000 mg | Freq: Four times a day (QID) | INTRAMUSCULAR | Status: AC | PRN

## 2017-03-17 MED ORDER — ASPIRIN 81 MG PO TBEC: 81.0000 mg | DELAYED_RELEASE_TABLET | Freq: Every day | ORAL | Status: DC

## 2017-03-17 MED ORDER — BRIMONIDINE TARTRATE-TIMOLOL 0.2-0.5 % OP SOLN: 1.0000 [drp] | Freq: Two times a day (BID) | OPHTHALMIC | Status: DC

## 2017-03-17 MED ORDER — ATORVASTATIN CALCIUM 80 MG PO TABS
80.0000 mg | ORAL_TABLET | Freq: Every day | ORAL | Status: DC
  Administered 2017-03-18 – 2017-03-26 (×8): 80 mg via ORAL
  Filled 2017-03-17 (×9): qty 1

## 2017-03-17 MED ORDER — CANAGLIFLOZIN 300 MG PO TABS
300.0000 mg | ORAL_TABLET | Freq: Every day | ORAL | Status: DC
  Administered 2017-03-18 – 2017-03-19 (×2): 300 mg via ORAL
  Filled 2017-03-17 (×2): qty 1

## 2017-03-17 MED ORDER — VALSARTAN-HYDROCHLOROTHIAZIDE 320-25 MG PO TABS: 1.0000 | ORAL_TABLET | Freq: Every day | ORAL | Status: DC

## 2017-03-17 MED ORDER — LORAZEPAM 1 MG PO TABS
0.0000 mg | ORAL_TABLET | Freq: Four times a day (QID) | ORAL | Status: AC
  Administered 2017-03-17 – 2017-03-19 (×6): 1 mg via ORAL
  Filled 2017-03-17 (×6): qty 1

## 2017-03-17 MED ORDER — SODIUM CHLORIDE 0.9 % IV SOLN: 250.0000 mL | INTRAVENOUS | Status: DC | PRN

## 2017-03-17 MED ORDER — THIAMINE HCL 100 MG/ML IJ SOLN: 100.0000 mg | Freq: Every day | INTRAMUSCULAR | Status: DC

## 2017-03-17 MED ORDER — TIMOLOL MALEATE 0.5 % OP SOLN
1.0000 [drp] | Freq: Two times a day (BID) | OPHTHALMIC | Status: DC
  Administered 2017-03-19 – 2017-03-23 (×6): 1 [drp] via OPHTHALMIC
  Filled 2017-03-17 (×2): qty 5

## 2017-03-17 MED ORDER — FOLIC ACID 1 MG PO TABS
1.0000 mg | ORAL_TABLET | Freq: Every day | ORAL | Status: DC
  Administered 2017-03-18 – 2017-03-26 (×8): 1 mg via ORAL
  Filled 2017-03-17 (×10): qty 1

## 2017-03-17 MED ORDER — SODIUM CHLORIDE 0.9% FLUSH: 3.0000 mL | INTRAVENOUS | Status: DC | PRN

## 2017-03-17 MED ORDER — METOPROLOL SUCCINATE ER 25 MG PO TB24
25.0000 mg | ORAL_TABLET | Freq: Every day | ORAL | Status: DC
  Administered 2017-03-18 – 2017-03-21 (×4): 25 mg via ORAL
  Filled 2017-03-17 (×4): qty 1

## 2017-03-17 MED ORDER — IRBESARTAN 300 MG PO TABS
300.0000 mg | ORAL_TABLET | Freq: Every day | ORAL | Status: DC
  Administered 2017-03-18 – 2017-03-26 (×8): 300 mg via ORAL
  Filled 2017-03-17 (×2): qty 1
  Filled 2017-03-17 (×3): qty 2
  Filled 2017-03-17: qty 1
  Filled 2017-03-17: qty 2
  Filled 2017-03-17: qty 1

## 2017-03-17 MED ORDER — ADULT MULTIVITAMIN W/MINERALS CH
1.0000 | ORAL_TABLET | Freq: Every day | ORAL | Status: DC
  Administered 2017-03-18 – 2017-03-26 (×8): 1 via ORAL
  Filled 2017-03-17 (×9): qty 1

## 2017-03-17 MED ORDER — NITROGLYCERIN 0.4 MG SL SUBL: 0.4000 mg | SUBLINGUAL_TABLET | SUBLINGUAL | Status: DC | PRN

## 2017-03-17 MED ORDER — SODIUM CHLORIDE 0.9% FLUSH
3.0000 mL | Freq: Two times a day (BID) | INTRAVENOUS | Status: DC
  Administered 2017-03-17 – 2017-03-23 (×4): 3 mL via INTRAVENOUS

## 2017-03-17 MED ORDER — VITAMIN B-1 100 MG PO TABS
100.0000 mg | ORAL_TABLET | Freq: Every day | ORAL | Status: DC
  Administered 2017-03-18 – 2017-03-26 (×8): 100 mg via ORAL
  Filled 2017-03-17 (×10): qty 1

## 2017-03-17 MED ORDER — ONDANSETRON HCL 4 MG/2ML IJ SOLN: 4.0000 mg | Freq: Four times a day (QID) | INTRAMUSCULAR | Status: DC | PRN

## 2017-03-17 MED ORDER — LORAZEPAM 1 MG PO TABS
1.0000 mg | ORAL_TABLET | Freq: Four times a day (QID) | ORAL | Status: AC | PRN
  Administered 2017-03-19 – 2017-03-20 (×2): 1 mg via ORAL

## 2017-03-17 NOTE — Progress Notes (Signed)
Dr. Burt Knack and Rosaria Ferries PA-C paged regarding pt admittance to 3w10. Requesting for admit orders.
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
## 2017-03-17 NOTE — Progress Notes (Signed)
Pt is very nervous and anxious regarding impending surgery.Pt is requesting if he can have a nerve pill he can take. Thanks.

## 2017-03-17 NOTE — Telephone Encounter (Signed)
New message  David Frost is pt daughter n law/   She verbalized that Pt had heart attack on 6-24 and 6-25  Pt is in the hospital, he will be placed in [facility] to have a Cath done   On 6-26 and he has 7 blockages    Pt family wants Dr.Berry to help with a Team pf physicians at [facility] will transport pt from [place], [facility]   Dr.Steve Uw [facility] Dr.Peter Melvenia Needles Dr.Owens

## 2017-03-18 ENCOUNTER — Inpatient Hospital Stay (HOSPITAL_COMMUNITY): Payer: Medicare Other

## 2017-03-18 ENCOUNTER — Other Ambulatory Visit: Payer: Self-pay | Admitting: *Deleted

## 2017-03-18 DIAGNOSIS — I25110 Atherosclerotic heart disease of native coronary artery with unstable angina pectoris: Secondary | ICD-10-CM

## 2017-03-18 DIAGNOSIS — I2510 Atherosclerotic heart disease of native coronary artery without angina pectoris: Secondary | ICD-10-CM

## 2017-03-18 LAB — CBC
HCT: 40.6 % (ref 39.0–52.0)
Hemoglobin: 14.1 g/dL (ref 13.0–17.0)
MCH: 31.7 pg (ref 26.0–34.0)
MCHC: 34.7 g/dL (ref 30.0–36.0)
MCV: 91.2 fL (ref 78.0–100.0)
PLATELETS: 177 10*3/uL (ref 150–400)
RBC: 4.45 MIL/uL (ref 4.22–5.81)
RDW: 12.1 % (ref 11.5–15.5)
WBC: 7.8 10*3/uL (ref 4.0–10.5)

## 2017-03-18 LAB — PULMONARY FUNCTION TEST
DL/VA % pred: 100 %
DL/VA: 4.84 ml/min/mmHg/L
DLCO cor % pred: 84 %
DLCO cor: 31.99 ml/min/mmHg
DLCO unc % pred: 83 %
DLCO unc: 31.53 ml/min/mmHg
FEF 25-75 Post: 1.74 L/sec
FEF 25-75 Pre: 1.51 L/sec
FEF2575-%Change-Post: 15 %
FEF2575-%Pred-Post: 59 %
FEF2575-%Pred-Pre: 51 %
FEV1-%Change-Post: 2 %
FEV1-%Pred-Post: 62 %
FEV1-%Pred-Pre: 60 %
FEV1-Post: 2.39 L
FEV1-Pre: 2.33 L
FEV1FVC-%Change-Post: -5 %
FEV1FVC-%Pred-Pre: 89 %
FEV6-%Change-Post: 8 %
FEV6-%Pred-Post: 76 %
FEV6-%Pred-Pre: 70 %
FEV6-Post: 3.79 L
FEV6-Pre: 3.48 L
FEV6FVC-%Change-Post: 0 %
FEV6FVC-%Pred-Post: 105 %
FEV6FVC-%Pred-Pre: 104 %
FVC-%Change-Post: 8 %
FVC-%Pred-Post: 73 %
FVC-%Pred-Pre: 67 %
FVC-Post: 3.79 L
FVC-Pre: 3.51 L
Post FEV1/FVC ratio: 63 %
Post FEV6/FVC ratio: 100 %
Pre FEV1/FVC ratio: 66 %
Pre FEV6/FVC Ratio: 99 %
RV % pred: 65 %
RV: 1.73 L
TLC % pred: 70 %
TLC: 5.53 L

## 2017-03-18 LAB — HEPARIN LEVEL (UNFRACTIONATED): Heparin Unfractionated: 0.31 IU/mL (ref 0.30–0.70)

## 2017-03-18 LAB — BASIC METABOLIC PANEL
Anion gap: 11 (ref 5–15)
BUN: 15 mg/dL (ref 6–20)
CALCIUM: 9.2 mg/dL (ref 8.9–10.3)
CHLORIDE: 95 mmol/L — AB (ref 101–111)
CO2: 26 mmol/L (ref 22–32)
CREATININE: 0.77 mg/dL (ref 0.61–1.24)
GFR calc Af Amer: >60 mL/min (ref >60)
GFR calc non Af Amer: >60 mL/min (ref >60)
Glucose, Bld: 127 mg/dL — ABNORMAL HIGH (ref 65–99)
Potassium: 3.5 mmol/L (ref 3.5–5.1)
Sodium: 132 mmol/L — ABNORMAL LOW (ref 135–145)

## 2017-03-18 LAB — GLUCOSE, CAPILLARY
GLUCOSE-CAPILLARY: 163 mg/dL — AB (ref 65–99)
GLUCOSE-CAPILLARY: 120 mg/dL — AB (ref 65–99)
GLUCOSE-CAPILLARY: 146 mg/dL — AB (ref 65–99)
Glucose-Capillary: 169 mg/dL — ABNORMAL HIGH (ref 65–99)

## 2017-03-18 LAB — LIPID PANEL
Cholesterol: 190 mg/dL (ref 0–200)
HDL: 29 mg/dL — ABNORMAL LOW (ref >40)
LDL Cholesterol: 95 mg/dL (ref 0–99)
Total CHOL/HDL Ratio: 6.6 RATIO
Triglycerides: 331 mg/dL — ABNORMAL HIGH (ref <150)
VLDL: 66 mg/dL — ABNORMAL HIGH (ref 0–40)

## 2017-03-18 MED ORDER — HEPARIN (PORCINE) IN NACL 100-0.45 UNIT/ML-% IJ SOLN
1800.0000 [IU]/h | INTRAMUSCULAR | Status: DC
  Administered 2017-03-18: 1500 [IU]/h via INTRAVENOUS
  Administered 2017-03-18 – 2017-03-20 (×4): 1650 [IU]/h via INTRAVENOUS
  Filled 2017-03-18 (×6): qty 250

## 2017-03-18 MED ORDER — ALBUTEROL SULFATE (2.5 MG/3ML) 0.083% IN NEBU
2.5000 mg | INHALATION_SOLUTION | Freq: Once | RESPIRATORY_TRACT | Status: AC
  Administered 2017-03-18: 2.5 mg via RESPIRATORY_TRACT

## 2017-03-18 NOTE — Progress Notes (Signed)
[place] for  Heparin Indication: chest pain/ACS  No Known Allergies  Patient Measurements: Height: 6\' 2"  (188 cm) Weight: (!) 304 lb 6.4 oz (138.1 kg) IBW/kg (Calculated) : 82.2 Heparin Dosing Weight: 116 kg  Vital Signs: Temp: 98.4 F (36.9 C) (06/28 1700) Temp Source: Oral (06/28 1700) BP: 134/87 (06/28 1700) Pulse Rate: 72 (06/28 1700)  Labs:  Recent Labs  03/17/17 2027 03/18/17 0503 03/18/17 1818  HGB  --  14.1  --   HCT  --  40.6  --   PLT  --  177  --   HEPARINUNFRC  --   --  0.31  CREATININE 0.80 0.77  --     Estimated Creatinine Clearance: 130.8 mL/min (by C-G formula based on SCr of 0.77 mg/dL).    Assessment: 66 year old male s/p cath 6/27 to begin heparin for severe CAD with plans for CABG.  First heparin level in range, but on low side at 0.31 units/mL. No bleeding noted.  Goal of Therapy:  Heparin level 0.3-0.7 units/ml Monitor platelets by anticoagulation protocol: Yes   Plan:  Increase Heparin to 1650 units / hr Daily heparin level, CBC    D. , PharmD, [place] Clinical Pharmacist [phone] 03/18/2017 7:13 PM

## 2017-03-18 NOTE — Consult Note (Signed)
[address]       [place] [address]             [phone]        David Frost [place] Medical Record #[id number] Date of Birth: Jan 21, 1951  Primary cardiologist: Dr. Gwenlyn Found Referring cardiologist: Dr. Debara Pickett Primary Care: [name], MD  Chief Complaint:   S/p NSTEMI, multivessel coronary artery disease   History of Present Illness:     This is a 66 year old male with a past medical history of hypertension, hyperlipidemia, DM (type 2), morbid obesity, OSA, cigar use, and alcohol abuse who initially presented to [facility] in [place], [place] on 03/15/2017 with complaints of substernal aching pain. 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 Upon further inquiry, he has had episodes like this for over a year. He described the pain as aching/burning across his chest. He denied any radiation, nausea, emesis, shortness of breath, or diaphoresis. He took 4 baby aspirin but thought it might be from reflux.  EKG showed no acute ST-T changes Echocardiogram showed LVEF 70%, moderate LVH, no significant valvular disease, and no pericardial effusion or thrombus. Subsequent EKG showed ST segment depression in leads V1-V3. In addition, Troponin I was 1.49 and later went as high as 3.550. He ruled in for a NSTEMI.   He was then transferred to [facility] on 03/16/2017 for further evaluation and treatment. He underwent a cardiac catheterization on 03/16/2017. He lives in [place] and was [place] when the aforementioned occurred. He requested transfer to [facility], as cardiologist, Dr. Gwenlyn Found, is here. Currently, the patient denies chest pain or shortness of breath.  Current Activity/ Functional Status: Patient is independent with mobility/ambulation, transfers, ADL's, IADL's.   Zubrod Score: At the time of surgery this patient's most appropriate activity  status/level should be described as: []     0    Normal activity, no symptoms [x]     1    Restricted in physical strenuous activity but ambulatory, able to do out light work []     2    Ambulatory and capable of self care, unable to do work activities, up and about                 more than 50%  Of the time                            []     3    Only limited self care, in bed greater than 50% of waking hours []     4    Completely disabled, no self care, confined to bed or chair []     5    Moribund  Past Medical History:  Diagnosis Date  . Anxiety   . Coronary artery disease   . Diabetes mellitus without complication ([place])    type 2  . Dietary indiscretion   . Dyspnea   . GERD (gastroesophageal reflux disease)   . Hypertension   . Obesity   . Obstructive sleep apnea   . Right bundle branch block   Back pain   Past Surgical History:  Procedure Laterality Date  . DOPPLER ECHOCARDIOGRAPHY  2010  . NM MYOVIEW LTD  2007  . TONSILLECTOMY    Laceration right 4th finger (table saw injury)   History  Alcohol  Use  . 42.0 oz/week  . 70 Cans of beer per week    Occupational History  . Not on file.   Social History Main Topics  . Smoking status:     Years:     Types: Cigar daily    Quit date: 03/15/2017  . Smokeless tobacco: Never Used  . Alcohol use 42.0 oz/week    70 Cans of beer per week  . Drug use: No  . Sexual activity: Not on file   Allergies: No Known Allergies  Current Facility-Administered Medications  Medication Dose Route Frequency Provider Last Rate Last Dose  . 0.9 %  sodium chloride infusion  250 mL Intravenous PRN Pixie Casino, MD      . acetaminophen (TYLENOL) tablet 650 mg  650 mg Oral Q4H PRN Pixie Casino, MD      . aspirin EC tablet 81 mg  81 mg Oral Daily Pixie Casino, MD   81 mg at 03/18/17 4008  . atorvastatin (LIPITOR) tablet 80 mg  80 mg Oral q1800 Hilty, Nadean Corwin, MD      . brimonidine (ALPHAGAN) 0.2 % ophthalmic solution 1 drop  1 drop  Both Eyes BID Hilty, Nadean Corwin, MD       And  . timolol (TIMOPTIC) 0.5 % ophthalmic solution 1 drop  1 drop Both Eyes BID Hilty, Nadean Corwin, MD      . canagliflozin [facility]) tablet 300 mg  300 mg Oral Daily Pixie Casino, MD   300 mg at 03/18/17 0825  . folic acid (FOLVITE) tablet 1 mg  1 mg Oral Daily Hilty, Nadean Corwin, MD   1 mg at 03/18/17 6761  . heparin ADULT infusion 100 units/mL (25000 units/239mL sodium chloride 0.45%)  1,500 Units/hr Intravenous Continuous Pixie Casino, MD 15 mL/hr at 03/18/17 1112 1,500 Units/hr at 03/18/17 1112  . irbesartan (AVAPRO) tablet 300 mg  300 mg Oral Daily Pixie Casino, MD   300 mg at 03/18/17 0827   And  . hydrochlorothiazide (HYDRODIURIL) tablet 25 mg  25 mg Oral Daily Pixie Casino, MD   25 mg at 03/18/17 0826  . insulin aspart (novoLOG) injection 0-20 Units  0-20 Units Subcutaneous TID WC Pixie Casino, MD   4 Units at 03/18/17 [phone]  . LORazepam (ATIVAN) tablet 1 mg  1 mg Oral Q6H PRN Pixie Casino, MD       Or  . LORazepam (ATIVAN) injection 1 mg  1 mg Intravenous Q6H PRN Pixie Casino, MD      . LORazepam (ATIVAN) tablet 0-4 mg  0-4 mg Oral Q6H Hilty, Nadean Corwin, MD   1 mg at 03/18/17 3267   Followed by  . [START ON 03/19/2017] LORazepam (ATIVAN) tablet 0-4 mg  0-4 mg Oral Q12H Hilty, Nadean Corwin, MD      . metoprolol succinate (TOPROL-XL) 24 hr tablet 25 mg  25 mg Oral Daily Pixie Casino, MD   25 mg at 03/18/17 0827  . multivitamin with minerals tablet 1 tablet  1 tablet Oral Daily Hilty, Nadean Corwin, MD   1 tablet at 03/18/17 0827  . nitroGLYCERIN (NITROSTAT) SL tablet 0.4 mg  0.4 mg Sublingual Q5 Min x 3 PRN Pixie Casino, MD      . ondansetron [facility]) injection 4 mg  4 mg Intravenous Q6H PRN Hilty, Nadean Corwin, MD      . sodium chloride flush (NS) 0.9 % injection 3 mL  3 mL Intravenous Q12H  Pixie Casino, MD   3 mL at 03/18/17 0830  . sodium chloride flush (NS) 0.9 % injection 3 mL  3 mL Intravenous PRN Hilty, Nadean Corwin,  MD      . thiamine (VITAMIN B-1) tablet 100 mg  100 mg Oral Daily Hilty, Nadean Corwin, MD   100 mg at 03/18/17 0825    Prescriptions Prior to Admission  Medication Sig Dispense Refill Last Dose  . aspirin (ASPIRIN EC) 81 MG EC tablet Take 81 mg by mouth daily. Swallow whole.   Past Week at Unknown time  . brimonidine-timolol (COMBIGAN) 0.2-0.5 % ophthalmic solution Place 1 drop into both eyes 2 (two) times daily.    Past Week at Unknown time  . Canagliflozin (INVOKANA) 300 MG TABS Take 300 mg by mouth daily.   Past Week at Unknown time  . metoprolol succinate (TOPROL-XL) 25 MG 24 hr tablet Take 25 mg by mouth daily.  1 Past Week at Unknown time  . valsartan-hydrochlorothiazide (DIOVAN-HCT) 320-25 MG per tablet Take 1 tablet by mouth daily.   Past Week at Unknown time    Family History  Problem Relation Age of Onset  . Heart attack Father    Review of Systems:    Cardiac Review of Systems: Y or N  Chest Pain [  Y  ]  Resting SOB [  N ] Exertional SOB  [place].Slicker  ]  Orthopnea [ N ]   Palpitations [ N ] Syncope  [ N ]   Presyncope [ N  ]  General Review of Systems: [Y] = yes [  ]=no Constitional: anorexia [ N ]; nausea Aqua.Slicker  ]; night sweats Aqua.Slicker  ]; fever Aqua.Slicker  ]; or chills Aqua.Slicker  ]                                                               Eye : blurred vision [ N ]; diplopia [  N ]; vision changes Aqua.Slicker  ];  Amaurosis fugax[N  ]; Resp: cough N[  ];  wheezing[N  ];  hemoptysis[N  ];  GI:  vomiting[ N ];  dysphagia[N  ]; melena[N  ];  hematochezia Aqua.Slicker  ]; heartburn[Y  ];    GU:  hematuria[N  ];   dysuria [ N ];  nocturia[ N ];                Skin: venous stasis changes [ Y ];  or itching[ N ]; Musculosketetal: myalgias[N  ]; back pain[ Y ];  Heme/Lymph: bruising[ N ];  bleeding[ N ];  anemia[N  ];  Neuro: TIA[ N ];  stroke[N  ];  vertigo[ N ];  seizures[N  ];  difficulty walking[ N ];  Psych:depression[ N ]; anxiety[Y  ];  Endocrine: diabetes[ Y ];  thyroid dysfunction[ N]   Physical Exam: BP (!) 157/87  (BP Location: Left Arm)   Pulse 76   Temp 97.7 F (36.5 C) (Oral)   Ht 6\' 2"  (1.88 m)   Wt (!) 138.1 kg (304 lb 6.4 oz)   SpO2 100%   BMI 39.08 kg/m    General appearance: alert, cooperative and no distress Head: Normocephalic, without obvious abnormality, atraumatic Neck: no carotid bruit, no JVD and supple, symmetrical, trachea midline Resp: clear to  auscultation bilaterally Cardio: RRR, no murmur GI: Soft, obese, non tender, bowel sounds present Extremities: venous stasis dermatitis noted and Palpable pulses, no foot ulcers, some LE edema Neurologic: Grossly normal  Diagnostic Studies & Laboratory data: Copy of cath results (drawing on chart), apparently there is a catheterization on CD   Recent Radiology Findings:   No results found.   I have independently reviewed the above radiologic studies.  Recent Lab Findings: Lab Results  Component Value Date   WBC 7.8 03/18/2017   HGB 14.1 03/18/2017   HCT 40.6 03/18/2017   PLT 177 03/18/2017   GLUCOSE 127 (H) 03/18/2017   CHOL 190 03/18/2017   TRIG 331 (H) 03/18/2017   HDL 29 (L) 03/18/2017   LDLCALC 95 03/18/2017   NA 132 (L) 03/18/2017   K 3.5 03/18/2017   CL 95 (L) 03/18/2017   CREATININE 0.77 03/18/2017   BUN 15 03/18/2017   CO2 26 03/18/2017   Assessment / Plan:      1.NSTEMI, coronary artery disease-continue Heparin drip. Dr. Servando Snare to evaluate for coronary artery bypass grafting surgery. Patient scheduled for Monday 03/22/2017. 2. Hyperlipidemia-continue Atorvastatin 3. Hypertension-continue Toprol XL 25 mg daily, HCTZ 25 g daily, and Irbesartan 300 mg daily. Cardiology following. 4. ETOH abuse-admits to at least 10 beers daily. Monitor for withdrawal. Continue MVI and thiamine. 5. DM-CBGs 163/120. On Invokana 300 mg daily as taken prior to admission. Await HGA1C 6. OSA    David Pinks PA-C 03/18/2017 11:23 AM  Patient seen and chart examined, copies of cath located and reviewed in pacc  system    With positive symptoms significant 3 vessel coronary artery disease, and recent myocardial infarction and agree with the recommendation to proceed with coronary artery bypass grafting. Risks and options are discussed with the patient his sons and wife in detail. We will check a PT Y 12 is appears he was loaded with Plavix on his presentation at outside hospital. Tentative surgery plan for Monday, July 2.  I  spent 40 minutes counseling the patient face to face and 50% or more the  time was spent in counseling and coordination of care. The total time spent in the appointment was 60 minutes.  [name] MD Beeper [phone] Office [phone] 03/18/2017 5:30 PM

## 2017-03-18 NOTE — Progress Notes (Signed)
ANTICOAGULATION CONSULT NOTE - Initial Consult  Pharmacy Consult for  Heparin Indication: chest pain/ACS  No Known Allergies  Patient Measurements: Height: 6\' 2"  (188 cm) Weight: (!) 304 lb 6.4 oz (138.1 kg) IBW/kg (Calculated) : 82.2 Heparin Dosing Weight: 116 kg  Vital Signs: Temp: 97.7 F (36.5 C) (06/28 0644) Temp Source: Oral (06/28 0644) BP: 157/87 (06/28 0644) Pulse Rate: 76 (06/28 0825)  Labs:  Recent Labs  03/17/17 2027 03/18/17 0503  HGB  --  14.1  HCT  --  40.6  PLT  --  177  CREATININE 0.80 0.77    Estimated Creatinine Clearance: 130.8 mL/min (by C-G formula based on SCr of 0.77 mg/dL).   Medical History: Past Medical History:  Diagnosis Date  . Anxiety   . Coronary artery disease   . Diabetes mellitus without complication ([place])    type 2  . Dietary indiscretion   . Dyspnea   . GERD (gastroesophageal reflux disease)   . Hypertension   . Obesity   . Obstructive sleep apnea   . Right bundle branch block     Assessment: 66 year old male s/p cath 6/27 to begin heparin for severe CAD with plans for CABG  Goal of Therapy:  Heparin level 0.3-0.7 units/ml Monitor platelets by anticoagulation protocol: Yes   Plan:  Heparin at 1500 units / hr Heparin level 8 hours after heparin starts Daily heparin level, CBC  Thank you Anette Guarneri, PharmD [phone]   03/18/2017,9:51 AM

## 2017-03-18 NOTE — Telephone Encounter (Signed)
LEFT MESSAGE TO CALL BACK INFORMATION WAS ROUTED TO DR BERRY . PATIENT IS ON OUR SERVICE AT [facility]. IF DR BERRY DOSE NOT  SEE PATIENT IN HOSPITAL .[facility] VISIT

## 2017-03-18 NOTE — Progress Notes (Signed)
Progress Note  Patient Name: David Frost Date of Encounter: 03/18/2017  Primary Cardiologist: Dr. Gwenlyn Found   Subjective   Denies chest pain or shortness of breath. Very anxious about his upcoming surgery.  Inpatient Medications    Scheduled Meds: . aspirin EC  81 mg Oral Daily  . atorvastatin  80 mg Oral q1800  . brimonidine  1 drop Both Eyes BID   And  . timolol  1 drop Both Eyes BID  . canagliflozin  300 mg Oral Daily  . folic acid  1 mg Oral Daily  . heparin  5,000 Units Subcutaneous Q8H  . irbesartan  300 mg Oral Daily   And  . hydrochlorothiazide  25 mg Oral Daily  . insulin aspart  0-20 Units Subcutaneous TID WC  . LORazepam  0-4 mg Oral Q6H   Followed by  . [START ON 03/19/2017] LORazepam  0-4 mg Oral Q12H  . metoprolol succinate  25 mg Oral Daily  . multivitamin with minerals  1 tablet Oral Daily  . sodium chloride flush  3 mL Intravenous Q12H  . thiamine  100 mg Oral Daily   Or  . thiamine  100 mg Intravenous Daily   Continuous Infusions: . sodium chloride     PRN Meds: sodium chloride, acetaminophen, LORazepam **OR** LORazepam, nitroGLYCERIN, ondansetron (ZOFRAN) IV, sodium chloride flush   Vital Signs    Vitals:   03/17/17 1747 03/18/17 0644 03/18/17 0825  BP: 124/81 (!) 157/87   Pulse: 74 80 76  Temp: 97.8 F (36.6 C) 97.7 F (36.5 C)   TempSrc: Oral Oral   SpO2: 100% 100%   Weight: (!) 138.1 kg (304 lb 6 oz) (!) 138.1 kg (304 lb 6.4 oz)   Height: 6\' 2"  (1.88 m)      Intake/Output Summary (Last 24 hours) at 03/18/17 0918 Last data filed at 03/18/17 0541  Gross per 24 hour  Intake                0 ml  Output                0 ml  Net                0 ml   Filed Weights   03/17/17 1747 03/18/17 0644  Weight: (!) 138.1 kg (304 lb 6 oz) (!) 138.1 kg (304 lb 6.4 oz)    Telemetry    Sinus rhythm. Sinus arrhythmia. - Personally Reviewed  ECG    03/18/17: Sinus rhythm. Rate 79 beats minute. First degree AV block. Right bundle branch  block.  LAFB Personally Reviewed  Physical Exam   GEN: Well-appearing. Obese. No acute distress.   Neck: No JVD Cardiac: RRR, no murmurs, rubs, or gallops.  Respiratory: Clear to auscultation bilaterally. no crackles, rhonchi, or wheezes.  GI: Soft, nontender, non-distended  MS:  No deformity. Neuro:  Nonfocal  Psych: Normal affect  Ext: 1+ pitting edema to the mid tibia bilaterally.  2+ radial , DP, PT pulses bilaterally  Labs    Chemistry Recent Labs Lab 03/17/17 2027 03/18/17 0503  NA  --  132*  K  --  3.5  CL  --  95*  CO2  --  26  GLUCOSE  --  127*  BUN  --  15  CREATININE 0.80 0.77  CALCIUM  --  9.2  GFRNONAA >60 >60  GFRAA >60 >60  ANIONGAP  --  11     Hematology Recent Labs Lab 03/18/17  0503  WBC 7.8  RBC 4.45  HGB 14.1  HCT 40.6  MCV 91.2  MCH 31.7  MCHC 34.7  RDW 12.1  PLT 177    Cardiac EnzymesNo results for input(s): TROPONINI in the last 168 hours. No results for input(s): TROPIPOC in the last 168 hours.   BNPNo results for input(s): BNP, PROBNP in the last 168 hours.   DDimer No results for input(s): DDIMER in the last 168 hours.   Radiology    No results found.  Cardiac Studies   Echo 01/08/09:  LVEF greater than 55%.  Grade 1 diastolic dysfunction. Mild LVH. Mild Mild left atrial enlargement. Mild mitral annular calcification. Trace mitral regurgitation. Trace tricuspid regurgitation. Normal  Echo 03/15/17: LVEF greater than 70%. Moderate LVH. Grade 1 diastolic dysfunction. Normal RV function. Mild MR. Normal RVSP.                                                                                                                                                                                                                                                                                                               Patient Profile     66 y.o. male with Hypertension, hyperlipidemia, alcohol abuse, and morbid obesity here with non-ST  elevation myocardial infarction. Found to have multivessel CAD.  Assessment & Plan    # NSTEMI: # Multivessel CAD: # Hyperlipidemia: Mr. Beck was found to have multivessel CAD on cardiac catheterization. Troponin was elevated to 3.5. He was initially started on subcutaneous heparin. We will switch this to IV heparin given that he presented with NSTEMI. Cardiac thoracic surgery has been counseled to. His cardiac cath and echo will be uploaded to the system this morning. Continue aspirin and atorvastatin. We will order carotid Dopplers.   # Hypertension: Initial BP this am is elevated.  He is quite anxious and hasn't received his AM meds yet.  Continue irbesartan, HCTZ and metoprolol.  Continue to monitor for now.   # Tobacco abuse:  Encouraged cessation.  # EtOH abuse: Drinks up  to 10 beers daily.  Will monitor for withdrawal on CIWA protocol.  Encouraged limiting EtOH to no more than 2/day.    Signed, Skeet Latch, MD  03/18/2017, 9:18 AM

## 2017-03-19 DIAGNOSIS — I25110 Atherosclerotic heart disease of native coronary artery with unstable angina pectoris: Secondary | ICD-10-CM

## 2017-03-19 LAB — HEPARIN LEVEL (UNFRACTIONATED): HEPARIN UNFRACTIONATED: 0.45 [IU]/mL (ref 0.30–0.70)

## 2017-03-19 LAB — HEMOGLOBIN A1C
Hgb A1c MFr Bld: 6 % — ABNORMAL HIGH (ref 4.8–5.6)
Mean Plasma Glucose: 126 mg/dL

## 2017-03-19 LAB — CBC
HCT: 43.8 % (ref 39.0–52.0)
Hemoglobin: 15.2 g/dL (ref 13.0–17.0)
MCH: 31.6 pg (ref 26.0–34.0)
MCHC: 34.7 g/dL (ref 30.0–36.0)
MCV: 91.1 fL (ref 78.0–100.0)
PLATELETS: 179 10*3/uL (ref 150–400)
RBC: 4.81 MIL/uL (ref 4.22–5.81)
RDW: 12 % (ref 11.5–15.5)
WBC: 9.4 10*3/uL (ref 4.0–10.5)

## 2017-03-19 LAB — GLUCOSE, CAPILLARY
GLUCOSE-CAPILLARY: 126 mg/dL — AB (ref 65–99)
GLUCOSE-CAPILLARY: 132 mg/dL — AB (ref 65–99)
GLUCOSE-CAPILLARY: 194 mg/dL — AB (ref 65–99)
Glucose-Capillary: 129 mg/dL — ABNORMAL HIGH (ref 65–99)

## 2017-03-19 LAB — PLATELET INHIBITION P2Y12: Platelet Function  P2Y12: 192 [PRU] — ABNORMAL LOW (ref 194–418)

## 2017-03-19 MED ORDER — FUROSEMIDE 20 MG PO TABS
20.0000 mg | ORAL_TABLET | Freq: Every day | ORAL | Status: DC
  Administered 2017-03-19 – 2017-03-21 (×3): 20 mg via ORAL
  Filled 2017-03-19 (×3): qty 1

## 2017-03-19 NOTE — Progress Notes (Signed)
[place] for  Heparin Indication: chest pain/ACS  No Known Allergies  Patient Measurements: Height: 6\' 2"  (188 cm) Weight: (!) 302 lb 6.4 oz (137.2 kg) IBW/kg (Calculated) : 82.2 Heparin Dosing Weight: 116 kg  Vital Signs: Temp: 98.2 F (36.8 C) (06/29 0755) Temp Source: Oral (06/29 0755) BP: 122/88 (06/29 0755) Pulse Rate: 77 (06/29 0755)  Labs:  Recent Labs  03/17/17 2027 03/18/17 0503 03/18/17 1818 03/19/17 0355  HGB  --  14.1  --  15.2  HCT  --  40.6  --  43.8  PLT  --  177  --  179  HEPARINUNFRC  --   --  0.31 0.45  CREATININE 0.80 0.77  --   --     Estimated Creatinine Clearance: 130.3 mL/min (by C-G formula based on SCr of 0.77 mg/dL).    Assessment: 66 year old male s/p cath 6/26 at OSH on heparin for severe CAD with plans for CABG 03/22/17. Not on any AC PTA. Heparin level therapeutic x2 after empiric dose increase (initial level at lower end of goal) Hgb/PLTC stable and wnl No bleeding noted  Goal of Therapy:  Heparin level 0.3-0.7 units/ml Monitor platelets by anticoagulation protocol: Yes   Plan:  Continue Heparin gtt at 1650 units / hr Daily heparin level, CBC Monitor for s/sx bleeding  Carlean Jews, Pharm.D. PGY1 Pharmacy Resident 6/29/20188:49 AM Pager [phone]

## 2017-03-19 NOTE — Progress Notes (Addendum)
[address]       [place] [address]             [phone]                   Procedure(s) (LRB): CORONARY ARTERY BYPASS GRAFTING (CABG) (N/A) TRANSESOPHAGEAL ECHOCARDIOGRAM (TEE) (N/A)  LOS: 2 days   Subjective: Stable , no chest pain   Objective: Vital signs in last 24 hours: Patient Vitals for the past 24 hrs:  BP Temp Temp src Pulse Resp SpO2 Height Weight  03/19/17 1300 124/82 98 F (36.7 C) Oral 72 19 100 % - -  03/19/17 0755 122/88 98.2 F (36.8 C) Oral 77 18 100 % - -  03/19/17 0641 (!) 128/93 98 F (36.7 C) Oral 75 17 100 % - (!) 302 lb 6.4 oz (137.2 kg)  03/18/17 2132 (!) 145/82 98.5 F (36.9 C) Oral 63 16 98 % - -    Filed Weights   03/17/17 1747 03/18/17 0644 03/19/17 0641  Weight: (!) 304 lb 6 oz (138.1 kg) (!) 304 lb 6.4 oz (138.1 kg) (!) 302 lb 6.4 oz (137.2 kg)    Hemodynamic parameters for last 24 hours:    Intake/Output from previous day: 06/28 0701 - 06/29 0700 In: 787.9 [P.O.:480; I.V.:307.9] Out: -  Intake/Output this shift: No intake/output data recorded.  Scheduled Meds: . aspirin EC  81 mg Oral Daily  . atorvastatin  80 mg Oral q1800  . brimonidine  1 drop Both Eyes BID   And  . timolol  1 drop Both Eyes BID  . canagliflozin  300 mg Oral Daily  . folic acid  1 mg Oral Daily  . furosemide  20 mg Oral Daily  . irbesartan  300 mg Oral Daily   And  . hydrochlorothiazide  25 mg Oral Daily  . insulin aspart  0-20 Units Subcutaneous TID WC  . LORazepam  0-4 mg Oral Q6H   Followed by  . LORazepam  0-4 mg Oral Q12H  . metoprolol succinate  25 mg Oral Daily  . multivitamin with minerals  1 tablet Oral Daily  . sodium chloride flush  3 mL Intravenous Q12H  . thiamine  100 mg Oral Daily   Continuous Infusions: . sodium chloride    . heparin 1,650 Units/hr (03/19/17 0834)   PRN Meds:.sodium chloride, acetaminophen, LORazepam **OR** LORazepam, nitroGLYCERIN, ondansetron (ZOFRAN) IV, sodium chloride  flush  General appearance: alert and cooperative Neurologic: intact Heart: regular rate and rhythm, S1, S2 normal, no murmur, click, rub or gallop Lungs: clear to auscultation bilaterally Abdomen: soft, non-tender; bowel sounds normal; no masses,  no organomegaly Extremities: extremities normal, atraumatic, no cyanosis or edema and Homans sign is negative, no sign of DVT  Lab Results: CBC: Recent Labs  03/18/17 0503 03/19/17 0355  WBC 7.8 9.4  HGB 14.1 15.2  HCT 40.6 43.8  PLT 177 179   BMET:  Recent Labs  03/17/17 2027 03/18/17 0503  NA  --  132*  K  --  3.5  CL  --  95*  CO2  --  26  GLUCOSE  --  127*  BUN  --  15  CREATININE 0.80 0.77  CALCIUM  --  9.2    PT/INR: No results for input(s): LABPROT, INR in the last 72 hours.   Radiology Dg Chest 2 View  Result Date: 03/18/2017 CLINICAL DATA:  Bypass surgery scheduled, history of hypertension EXAM: CHEST  2 VIEW  COMPARISON:  08/01/2005 FINDINGS: Hyperinflation. No focal pulmonary infiltrate or effusion. Cardiomediastinal silhouette within normal limits. No pneumothorax. Mild degenerative changes of the spine. IMPRESSION: No active cardiopulmonary disease. Electronically Signed   By: [name] Foil M.D.   On: 03/18/2017 21:20   [id number] 192   Assessment/Plan: Plan:  Procedure(s) (LRB): CORONARY ARTERY BYPASS GRAFTING (CABG) (N/A) TRANSESOPHAGEAL ECHOCARDIOGRAM (TEE) (N/A)  I have reviewed patient cath films with him and family. CABG has been recommended and planned for Monday 7:30  The goals risks and alternatives of the planned surgical procedure Procedure(s): CORONARY ARTERY BYPASS GRAFTING (CABG) (N/A) TRANSESOPHAGEAL ECHOCARDIOGRAM (TEE) (N/A)  have been discussed with the patient in detail. The risks of the procedure including death, infection, stroke, myocardial infarction, bleeding, blood transfusion have all been discussed specifically.  I have quoted David Frost a 3% of perioperative mortality and a  complication rate as high as 40 %. The patient's questions have been answered.David Frost is willing  to proceed with the planned procedure.  Invokana should not be used preop and is associated with postop euglycemic ketoacidosis  [name] MD 03/19/2017 7:36 PM

## 2017-03-19 NOTE — Progress Notes (Signed)
Progress Note  Patient Name: David Frost Date of Encounter: 03/19/2017  Primary Cardiologist: Dr. Gwenlyn Frost   Subjective   Feeling well.  Denies chest pain or shortness of breath. Continues to be anxious about his upcoming surgery.  Inpatient Medications    Scheduled Meds: . aspirin EC  81 mg Oral Daily  . atorvastatin  80 mg Oral q1800  . brimonidine  1 drop Both Eyes BID   And  . timolol  1 drop Both Eyes BID  . canagliflozin  300 mg Oral Daily  . folic acid  1 mg Oral Daily  . irbesartan  300 mg Oral Daily   And  . hydrochlorothiazide  25 mg Oral Daily  . insulin aspart  0-20 Units Subcutaneous TID WC  . LORazepam  0-4 mg Oral Q6H   Followed by  . LORazepam  0-4 mg Oral Q12H  . metoprolol succinate  25 mg Oral Daily  . multivitamin with minerals  1 tablet Oral Daily  . sodium chloride flush  3 mL Intravenous Q12H  . thiamine  100 mg Oral Daily   Continuous Infusions: . sodium chloride    . heparin 1,650 Units/hr (03/18/17 2310)   PRN Meds: sodium chloride, acetaminophen, LORazepam **OR** LORazepam, nitroGLYCERIN, ondansetron (ZOFRAN) IV, sodium chloride flush   Vital Signs    Vitals:   03/18/17 0825 03/18/17 1700 03/18/17 2132 03/19/17 0641  BP:  134/87 (!) 145/82 (!) 128/93  Pulse: 76 72 63 75  Resp:   16 17  Temp:  98.4 F (36.9 C) 98.5 F (36.9 C) 98 F (36.7 C)  TempSrc:  Oral Oral Oral  SpO2:  97% 98% 100%  Weight:    (!) 137.2 kg (302 lb 6.4 oz)  Height:        Intake/Output Summary (Last 24 hours) at 03/19/17 0806 Last data filed at 03/18/17 1500  Gross per 24 hour  Intake           531.75 ml  Output                0 ml  Net           531.75 ml   Filed Weights   03/17/17 1747 03/18/17 0644 03/19/17 0641  Weight: (!) 138.1 kg (304 lb 6 oz) (!) 138.1 kg (304 lb 6.4 oz) (!) 137.2 kg (302 lb 6.4 oz)    Telemetry    Sinus rhythm. Sinus arrhythmia.  PAT (7 seconds) - Personally Reviewed  ECG    03/18/17: Sinus rhythm. Rate 79 beats  minute. First degree AV block. Right bundle branch block.  LAFB Personally Reviewed  Physical Exam    GEN: Well-appearing. No acute distress.  The Neck: No JVD.  No carotid bruits Cardiac: RRR, no murmurs, rubs, or gallops.  Respiratory: Clear to auscultation bilaterally. No crackles, wheezes, or rhonchi.  GI: Soft, nontender, non-distended  MS: No deformity. Neuro:  Nonfocal  Psych: Normal affect  Ext: WWP. 1+ pitting edema to the mid tibia bilaterally.  2+ radial , DP, PT pulses bilaterally   Labs    Chemistry  Recent Labs Lab 03/17/17 2027 03/18/17 0503  NA  --  132*  K  --  3.5  CL  --  95*  CO2  --  26  GLUCOSE  --  127*  BUN  --  15  CREATININE 0.80 0.77  CALCIUM  --  9.2  GFRNONAA >60 >60  GFRAA >60 >60  ANIONGAP  --  11  Hematology  Recent Labs Lab 03/18/17 0503 03/19/17 0355  WBC 7.8 9.4  RBC 4.45 4.81  HGB 14.1 15.2  HCT 40.6 43.8  MCV 91.2 91.1  MCH 31.7 31.6  MCHC 34.7 34.7  RDW 12.1 12.0  PLT 177 179    Cardiac EnzymesNo results for input(s): TROPONINI in the last 168 hours. No results for input(s): TROPIPOC in the last 168 hours.   BNPNo results for input(s): BNP, PROBNP in the last 168 hours.   DDimer No results for input(s): DDIMER in the last 168 hours.   Radiology    Dg Chest 2 View  Result Date: 03/18/2017 CLINICAL DATA:  Bypass surgery scheduled, history of hypertension EXAM: CHEST  2 VIEW COMPARISON:  08/01/2005 FINDINGS: Hyperinflation. No focal pulmonary infiltrate or effusion. Cardiomediastinal silhouette within normal limits. No pneumothorax. Mild degenerative changes of the spine. IMPRESSION: No active cardiopulmonary disease. Electronically Signed   By: [name] Foil M.D.   On: 03/18/2017 21:20    Cardiac Studies   Echo 01/08/09:  LVEF greater than 55%.  Grade 1 diastolic dysfunction. Mild LVH. Mild Mild left atrial enlargement. Mild mitral annular calcification. Trace mitral regurgitation. Trace tricuspid  regurgitation. Normal  Echo 03/15/17: LVEF greater than 70%. Moderate LVH. Grade 1 diastolic dysfunction. Normal RV function. Mild MR. Normal RVSP.             LHC 03/16/17:                                                                                                                                             50-60% mid LAD, 60% distal LAD, 40-50% D1, 60% mid RCA, 95% PL , 80% ostial OM1, 85% RI                                                                                                                                                    Patient Profile     66 y.o. male with Hypertension, hyperlipidemia, alcohol abuse, and morbid obesity here with non-ST elevation myocardial infarction. Frost to have multivessel CAD.  Assessment & Plan    # NSTEMI: # Multivessel CAD: # Hyperlipidemia: [name] was Frost to have multivessel CAD on cardiac catheterization. Troponin was elevated  to 3.5.  He has been evaluated by Dr. Alroy Frost had CABG on 03/2017. Continue aspirin, metoprolol, and atorvastatin. Carotid and lower extremity ultrasounds are pending.    # LE edema:  # Grade 1 diastolic dysfunction: [name] has very mild volume overload.  We will start Lasix 20 mg daily. I have inked the words didn't elevate his legs and sitting. We will also start Ted hose.  # Hypertension: Blood pressure control is fair.  Continue irbesartan, HCTZ and metoprolol.  Add lasix as above.   # Tobacco abuse:  Encouraged cessation.  # EtOH abuse: Drinks up to 10 beers daily.  Continue to monitor for withdrawal on CIWA protocol.  Encouraged limiting EtOH to no more than 2/day.    Signed, David Latch, MD  03/19/2017, 8:06 AM

## 2017-03-19 NOTE — Care Management Note (Signed)
Case Management Note  Patient Details  Name: David Frost MRN: [id number] Date of Birth: [birth year]  Subjective/Objective: Pt presented for S/p NSTEMI, multivessel coronary artery disease. Plan for CABG 03-22-17. Pt is from home with family support. PTA- Independent.                   Action/Plan: CM will continue to monitor for disposition needs post procedure.   Expected Discharge Date:                  Expected Discharge Plan:  [place]  In-House Referral:  NA  Discharge planning Services  CM Consult  Post Acute Care Choice:    Choice offered to:     DME Arranged:    DME Agency:     HH Arranged:    HH Agency:     Status of Service:  In process, will continue to follow  If discussed at Long Length of Stay Meetings, dates discussed:    Additional Comments:  [name], RN 03/19/2017, 3:37 PM

## 2017-03-19 NOTE — Progress Notes (Signed)
1325-1400 Discussed with pt the importance of mobility and IS after surgery. Reviewed sternal precautions and demonstrated how to get up and down adhering to precautions. Gave OHS booklet and care guide. Wrote down how to view pre op video. Gave diabetic diet as questions asked re snacks he can have. Briefly discussed CRP 2. Pt stated he has been walking without CP. We will follow up after surgery. Pt needs IS. Graylon Good RN BSN 03/19/2017 2:07 PM

## 2017-03-20 ENCOUNTER — Inpatient Hospital Stay (HOSPITAL_COMMUNITY): Payer: Medicare Other

## 2017-03-20 DIAGNOSIS — Z01810 Encounter for preprocedural cardiovascular examination: Secondary | ICD-10-CM

## 2017-03-20 LAB — URINALYSIS, ROUTINE W REFLEX MICROSCOPIC
Bacteria, UA: NONE SEEN
Bilirubin Urine: NEGATIVE
Glucose, UA: >=500 mg/dL — AB
Hgb urine dipstick: NEGATIVE
Ketones, ur: NEGATIVE mg/dL
Leukocytes, UA: NEGATIVE
Nitrite: NEGATIVE
Protein, ur: NEGATIVE mg/dL
Specific Gravity, Urine: 1.011 (ref 1.005–1.030)
Squamous Epithelial / LPF: NONE SEEN
WBC, UA: NONE SEEN WBC/hpf (ref 0–5)
pH: 6 (ref 5.0–8.0)

## 2017-03-20 LAB — CBC
HEMATOCRIT: 41.8 % (ref 39.0–52.0)
HEMOGLOBIN: 14.1 g/dL (ref 13.0–17.0)
MCH: 31.1 pg (ref 26.0–34.0)
MCHC: 33.7 g/dL (ref 30.0–36.0)
MCV: 92.1 fL (ref 78.0–100.0)
Platelets: 198 10*3/uL (ref 150–400)
RBC: 4.54 MIL/uL (ref 4.22–5.81)
RDW: 12.1 % (ref 11.5–15.5)
WBC: 9.2 10*3/uL (ref 4.0–10.5)

## 2017-03-20 LAB — VAS US DOPPLER PRE CABG
LEFT ECA DIAS: -7 cm/s
LEFT VERTEBRAL DIAS: 15 cm/s
Left CCA dist dias: 19 cm/s
Left CCA dist sys: 73 cm/s
Left CCA prox dias: 23 cm/s
Left CCA prox sys: 108 cm/s
Left ICA dist dias: -26 cm/s
Left ICA dist sys: -73 cm/s
Left ICA prox dias: -27 cm/s
Left ICA prox sys: -83 cm/s
RIGHT ECA DIAS: -10 cm/s
Right CCA prox dias: 20 cm/s
Right CCA prox sys: 125 cm/s
Right cca dist sys: -66 cm/s

## 2017-03-20 LAB — GLUCOSE, CAPILLARY
GLUCOSE-CAPILLARY: 155 mg/dL — AB (ref 65–99)
GLUCOSE-CAPILLARY: 136 mg/dL — AB (ref 65–99)
Glucose-Capillary: 135 mg/dL — ABNORMAL HIGH (ref 65–99)

## 2017-03-20 LAB — SURGICAL PCR SCREEN
MRSA, PCR: POSITIVE — AB
Staphylococcus aureus: POSITIVE — AB

## 2017-03-20 LAB — ABO/RH: ABO/RH(D): A POS

## 2017-03-20 LAB — TYPE AND SCREEN
ABO/RH(D): A POS
Antibody Screen: NEGATIVE

## 2017-03-20 LAB — HEPARIN LEVEL (UNFRACTIONATED): HEPARIN UNFRACTIONATED: 0.35 [IU]/mL (ref 0.30–0.70)

## 2017-03-20 MED ORDER — MUPIROCIN 2 % EX OINT
1.0000 "application " | TOPICAL_OINTMENT | Freq: Two times a day (BID) | CUTANEOUS | Status: DC
  Administered 2017-03-20 – 2017-03-25 (×8): 1 via NASAL
  Filled 2017-03-20 (×2): qty 22

## 2017-03-20 MED ORDER — METOPROLOL TARTRATE 12.5 MG HALF TABLET
12.5000 mg | ORAL_TABLET | Freq: Once | ORAL | Status: AC
  Administered 2017-03-22: 12.5 mg via ORAL
  Filled 2017-03-20: qty 1

## 2017-03-20 MED ORDER — CHLORHEXIDINE GLUCONATE 0.12 % MT SOLN
15.0000 mL | Freq: Once | OROMUCOSAL | Status: AC
  Administered 2017-03-22: 15 mL via OROMUCOSAL

## 2017-03-20 MED ORDER — CHLORHEXIDINE GLUCONATE CLOTH 2 % EX PADS
6.0000 | MEDICATED_PAD | Freq: Every day | CUTANEOUS | Status: DC
  Administered 2017-03-21 – 2017-03-22 (×2): 6 via TOPICAL

## 2017-03-20 MED ORDER — TEMAZEPAM 15 MG PO CAPS
15.0000 mg | ORAL_CAPSULE | Freq: Once | ORAL | Status: DC | PRN
Start: 2017-03-21 — End: 2017-03-22
  Filled 2017-03-20: qty 1

## 2017-03-20 MED ORDER — BISACODYL 5 MG PO TBEC: 5.0000 mg | DELAYED_RELEASE_TABLET | Freq: Once | ORAL | Status: DC

## 2017-03-20 MED ORDER — CHLORHEXIDINE GLUCONATE CLOTH 2 % EX PADS
6.0000 | MEDICATED_PAD | Freq: Once | CUTANEOUS | Status: AC
  Administered 2017-03-21: 6 via TOPICAL

## 2017-03-20 MED ORDER — CHLORHEXIDINE GLUCONATE CLOTH 2 % EX PADS: 6.0000 | MEDICATED_PAD | Freq: Once | CUTANEOUS | Status: AC

## 2017-03-20 NOTE — Progress Notes (Signed)
[place] for  Heparin Indication: chest pain/ACS  Allergies  Allergen Reactions  . No Known Allergies     Patient Measurements: Height: 6\' 2"  (188 cm) Weight: (!) 302 lb 3.2 oz (137.1 kg) IBW/kg (Calculated) : 82.2 Heparin Dosing Weight: 116 kg  Vital Signs: Temp: 97.6 F (36.4 C) (06/30 1123) Temp Source: Oral (06/30 1123) BP: 130/70 (06/30 1123) Pulse Rate: 74 (06/30 1123)  Labs:  Recent Labs  03/17/17 2027  03/18/17 0503 03/18/17 1818 03/19/17 0355 03/20/17 0234  HGB  --   < > 14.1  --  15.2 14.1  HCT  --   --  40.6  --  43.8 41.8  PLT  --   --  177  --  179 198  HEPARINUNFRC  --   --   --  0.31 0.45 0.35  CREATININE 0.80  --  0.77  --   --   --   < > = values in this interval not displayed.  Estimated Creatinine Clearance: 130.3 mL/min (by C-G formula based on SCr of 0.77 mg/dL).    Assessment: 66 year old male s/p cath 6/26 at OSH on heparin for severe CAD with plans for CABG 03/22/17. Not on any AC PTA. Heparin level remains therapeutic today on 1650 units/hr. CBC remains wnl and stable with no reported s/s bleeding.   Goal of Therapy:  Heparin level 0.3-0.7 units/ml Monitor platelets by anticoagulation protocol: Yes   Plan:  Continue Heparin gtt at 1650 units / hr Daily heparin level, CBC Monitor for s/sx bleeding F/u plans for CABG on Monday   Shiah Berhow K [place], Pharm.D. PGY1 Pharmacy Resident 6/30/201811:33 AM Pager [phone]

## 2017-03-20 NOTE — Progress Notes (Signed)
Right Lower Extremity Vein Map    Right Great Saphenous Vein   Segment Diameter Comment  1. Origin 5.79mm   2. High Thigh 5.71mm   3. Mid Thigh 4.1mm   4. Low Thigh 61mm   5. At Knee 4.87mm   6. High Calf 3.17mm   7. Low Calf 3.38mm branch  8. Ankle 2.8mm Multiple branches   mm    mm    mm     Left Lower Extremity Vein Map    Left Great Saphenous Vein   Segment Diameter Comment  1. Origin 7.66mm   2. High Thigh 5.43mm   3. Mid Thigh 4.30mm branch  4. Low Thigh 4.10mm   5. At Knee 4.52mm branch  6. High Calf 4.99mm   7. Low Calf 3.78mm branch  8. Ankle 3.46mm branch   mm    mm    mm

## 2017-03-20 NOTE — Progress Notes (Signed)
Pre-op Cardiac Surgery  Carotid Findings:  1-39% stenosis. Right vertebral not insonated. Left vertebral artery flow is antegrade.   Upper Extremity Right Left  Brachial Pressures 152T 159T  Radial Waveforms T T  Ulnar Waveforms T T  Palmar Arch (Allen's Test) Doppler signal remains normal with radial compression and obliterates with ulnar compression Doppler signal remains normal with radial compression and obliterates with ulnar compression   Findings:      Lower  Extremity Right Left  Dorsalis Pedis    Anterior Tibial 152T 159T  Posterior Tibial 166T 154T  Ankle/Brachial Indices 1.04 1.0    Findings:  WNL

## 2017-03-20 NOTE — Progress Notes (Signed)
DAILY PROGRESS NOTE   Patient Name: David Frost Date of Encounter: 03/20/2017  Hospital Problem List   Principal Problem:   Non-ST elevation (NSTEMI) myocardial infarction [facility]) Active Problems:   Essential hypertension   Morbid obesity ([place])   Obstructive sleep apnea   Right bundle branch block   DM2 (diabetes mellitus, type 2) ([place])   CAD, multiple vessel   Anxiety   Alcohol abuse   NSTEMI (non-ST elevated myocardial infarction) [facility])    Chief Complaint   No chest pain  Subjective   Seen by CT surgery -loaded with Plavix at [facility], P2Y12 192. Plan to wait until Monday for CABG.   Objective   Vitals:   03/19/17 2027 03/20/17 0401 03/20/17 0409 03/20/17 1123  BP: (!) 149/73 (!) 141/89  130/70  Pulse: 70 67  74  Resp: 20 16    Temp: 98.2 F (36.8 C) 97.8 F (36.6 C)  97.6 F (36.4 C)  TempSrc: Oral Oral  Oral  SpO2: 95% 96%  95%  Weight:   (!) 302 lb 3.2 oz (137.1 kg)   Height:        Intake/Output Summary (Last 24 hours) at 03/20/17 1334 Last data filed at 03/20/17 0851  Gross per 24 hour  Intake              798 ml  Output                0 ml  Net              798 ml   Filed Weights   03/18/17 0644 03/19/17 0641 03/20/17 0409  Weight: (!) 304 lb 6.4 oz (138.1 kg) (!) 302 lb 6.4 oz (137.2 kg) (!) 302 lb 3.2 oz (137.1 kg)    Physical Exam   General appearance: alert and no distress Lungs: clear to auscultation bilaterally Heart: regular rate and rhythm Extremities: extremities normal, atraumatic, no cyanosis or edema Neurologic: Grossly normal  Inpatient Medications    Scheduled Meds: . aspirin EC  81 mg Oral Daily  . atorvastatin  80 mg Oral q1800  . bisacodyl  5 mg Oral Once  . brimonidine  1 drop Both Eyes BID   And  . timolol  1 drop Both Eyes BID  . [START ON 03/22/2017] chlorhexidine  15 mL Mouth/Throat Once  . [START ON 03/21/2017] Chlorhexidine Gluconate Cloth  6 each Topical Once   And  . [START ON 03/22/2017] Chlorhexidine  Gluconate Cloth  6 each Topical Once  . folic acid  1 mg Oral Daily  . furosemide  20 mg Oral Daily  . irbesartan  300 mg Oral Daily   And  . hydrochlorothiazide  25 mg Oral Daily  . insulin aspart  0-20 Units Subcutaneous TID WC  . LORazepam  0-4 mg Oral Q12H  . metoprolol succinate  25 mg Oral Daily  . [START ON 03/22/2017] metoprolol tartrate  12.5 mg Oral Once  . multivitamin with minerals  1 tablet Oral Daily  . sodium chloride flush  3 mL Intravenous Q12H  . thiamine  100 mg Oral Daily    Continuous Infusions: . sodium chloride    . heparin 1,650 Units/hr (03/20/17 0401)    PRN Meds: sodium chloride, acetaminophen, LORazepam **OR** LORazepam, nitroGLYCERIN, ondansetron (ZOFRAN) IV, sodium chloride flush, [START ON 03/21/2017] temazepam   Labs   Results for orders placed or performed during the hospital encounter of 03/17/17 (from the past 48 hour(s))  Glucose, capillary  Status: Abnormal   Collection Time: 03/18/17  4:55 PM  Result Value Ref Range   Glucose-Capillary 169 (H) 65 - 99 mg/dL  Heparin level (unfractionated)     Status: None   Collection Time: 03/18/17  6:18 PM  Result Value Ref Range   Heparin Unfractionated 0.31 0.30 - 0.70 IU/mL    Comment:        IF HEPARIN RESULTS ARE BELOW EXPECTED VALUES, AND PATIENT DOSAGE HAS BEEN CONFIRMED, SUGGEST FOLLOW UP TESTING OF ANTITHROMBIN III LEVELS.   Glucose, capillary     Status: Abnormal   Collection Time: 03/18/17  9:37 PM  Result Value Ref Range   Glucose-Capillary 146 (H) 65 - 99 mg/dL  Heparin level (unfractionated)     Status: None   Collection Time: 03/19/17  3:55 AM  Result Value Ref Range   Heparin Unfractionated 0.45 0.30 - 0.70 IU/mL    Comment:        IF HEPARIN RESULTS ARE BELOW EXPECTED VALUES, AND PATIENT DOSAGE HAS BEEN CONFIRMED, SUGGEST FOLLOW UP TESTING OF ANTITHROMBIN III LEVELS.   CBC     Status: None   Collection Time: 03/19/17  3:55 AM  Result Value Ref Range   WBC 9.4 4.0 - 10.5  K/uL   RBC 4.81 4.22 - 5.81 MIL/uL   Hemoglobin 15.2 13.0 - 17.0 g/dL   HCT 43.8 39.0 - 52.0 %   MCV 91.1 78.0 - 100.0 fL   MCH 31.6 26.0 - 34.0 pg   MCHC 34.7 30.0 - 36.0 g/dL   RDW 12.0 11.5 - 15.5 %   Platelets 179 150 - 400 K/uL  Platelet inhibition p2y12 (Not at [facility])     Status: Abnormal   Collection Time: 03/19/17  3:55 AM  Result Value Ref Range   Platelet Function  P2Y12 192 (L) 194 - 418 PRU  Glucose, capillary     Status: Abnormal   Collection Time: 03/19/17  7:46 AM  Result Value Ref Range   Glucose-Capillary 126 (H) 65 - 99 mg/dL  Glucose, capillary     Status: Abnormal   Collection Time: 03/19/17 11:13 AM  Result Value Ref Range   Glucose-Capillary 132 (H) 65 - 99 mg/dL  Glucose, capillary     Status: Abnormal   Collection Time: 03/19/17  4:50 PM  Result Value Ref Range   Glucose-Capillary 194 (H) 65 - 99 mg/dL   Comment 1 Notify RN    Comment 2 Document in Chart   Glucose, capillary     Status: Abnormal   Collection Time: 03/19/17  8:18 PM  Result Value Ref Range   Glucose-Capillary 129 (H) 65 - 99 mg/dL  Heparin level (unfractionated)     Status: None   Collection Time: 03/20/17  2:34 AM  Result Value Ref Range   Heparin Unfractionated 0.35 0.30 - 0.70 IU/mL    Comment:        IF HEPARIN RESULTS ARE BELOW EXPECTED VALUES, AND PATIENT DOSAGE HAS BEEN CONFIRMED, SUGGEST FOLLOW UP TESTING OF ANTITHROMBIN III LEVELS.   CBC     Status: None   Collection Time: 03/20/17  2:34 AM  Result Value Ref Range   WBC 9.2 4.0 - 10.5 K/uL   RBC 4.54 4.22 - 5.81 MIL/uL   Hemoglobin 14.1 13.0 - 17.0 g/dL   HCT 41.8 39.0 - 52.0 %   MCV 92.1 78.0 - 100.0 fL   MCH 31.1 26.0 - 34.0 pg   MCHC 33.7 30.0 - 36.0 g/dL   RDW  12.1 11.5 - 15.5 %   Platelets 198 150 - 400 K/uL  Glucose, capillary     Status: Abnormal   Collection Time: 03/20/17 11:19 AM  Result Value Ref Range   Glucose-Capillary 155 (H) 65 - 99 mg/dL    ECG   N/A  Telemetry   NSR - Personally  Reviewed  Radiology    Dg Chest 2 View  Result Date: 03/18/2017 CLINICAL DATA:  Bypass surgery scheduled, history of hypertension EXAM: CHEST  2 VIEW COMPARISON:  08/01/2005 FINDINGS: Hyperinflation. No focal pulmonary infiltrate or effusion. Cardiomediastinal silhouette within normal limits. No pneumothorax. Mild degenerative changes of the spine. IMPRESSION: No active cardiopulmonary disease. Electronically Signed   By: [name] Foil M.D.   On: 03/18/2017 21:20    Cardiac Studies   N/A  Assessment   1. Principal Problem: 2.   Non-ST elevation (NSTEMI) myocardial infarction ([place]) 3. Active Problems: 4.   Essential hypertension 5.   Morbid obesity ([place]) 6.   Obstructive sleep apnea 7.   Right bundle branch block 8.   DM2 (diabetes mellitus, type 2) ([place]) 9.   CAD, multiple vessel 10.   Anxiety 11.   Alcohol abuse 12.   NSTEMI (non-ST elevated myocardial infarction) ([place]) 13.   Plan   1. Stable -asymptomatic. On heparin. H/H stable. Plan for CABG Monday. Continue current meds.  Time Spent Directly with Patient:  I have spent a total of 15 minutes with the patient reviewing hospital notes, telemetry, EKGs, labs and examining the patient as well as establishing an assessment and plan that was discussed personally with the patient. > 50% of time was spent in direct patient care.  Length of Stay:  LOS: 3 days   Pixie Casino, MD, [place]  Attending Cardiologist  Direct Dial: [phone]  Fax: [phone]  Website:  [email or web address] 03/20/2017, 1:34 PM

## 2017-03-20 NOTE — Plan of Care (Signed)
Problem: Education: Goal: Knowledge of [facility] Education information/materials will improve Outcome: Progressing Patient aware of plan of care.  RN provided medication education on all medications prior to administration.  Patient denied pain.  RN instructed patient to notify RN immediately if patient started to experience any pain.  Patient agreeable to RN's instruction.

## 2017-03-21 LAB — CBC
HCT: 41.1 % (ref 39.0–52.0)
HEMOGLOBIN: 14.2 g/dL (ref 13.0–17.0)
MCH: 31.2 pg (ref 26.0–34.0)
MCHC: 34.5 g/dL (ref 30.0–36.0)
MCV: 90.3 fL (ref 78.0–100.0)
Platelets: 205 10*3/uL (ref 150–400)
RBC: 4.55 MIL/uL (ref 4.22–5.81)
RDW: 12 % (ref 11.5–15.5)
WBC: 10.3 10*3/uL (ref 4.0–10.5)

## 2017-03-21 LAB — BLOOD GAS, ARTERIAL
Acid-Base Excess: 3.4 mmol/L — ABNORMAL HIGH (ref 0.0–2.0)
Bicarbonate: 27.3 mmol/L (ref 20.0–28.0)
Drawn by: 414221
O2 Saturation: 95.6 %
Patient temperature: 98.6
pCO2 arterial: 40.5 mmHg (ref 32.0–48.0)
pH, Arterial: 7.444 (ref 7.350–7.450)
pO2, Arterial: 79 mmHg — ABNORMAL LOW (ref 83.0–108.0)

## 2017-03-21 LAB — COMPREHENSIVE METABOLIC PANEL
ALT: 118 U/L — ABNORMAL HIGH (ref 17–63)
AST: 48 U/L — ABNORMAL HIGH (ref 15–41)
Albumin: 3.6 g/dL (ref 3.5–5.0)
Alkaline Phosphatase: 71 U/L (ref 38–126)
Anion gap: 9 (ref 5–15)
BUN: 16 mg/dL (ref 6–20)
CO2: 29 mmol/L (ref 22–32)
Calcium: 9.1 mg/dL (ref 8.9–10.3)
Chloride: 92 mmol/L — ABNORMAL LOW (ref 101–111)
Creatinine, Ser: 0.87 mg/dL (ref 0.61–1.24)
GFR calc Af Amer: >60 mL/min (ref >60)
GFR calc non Af Amer: >60 mL/min (ref >60)
Glucose, Bld: 129 mg/dL — ABNORMAL HIGH (ref 65–99)
Potassium: 3.5 mmol/L (ref 3.5–5.1)
Sodium: 130 mmol/L — ABNORMAL LOW (ref 135–145)
Total Bilirubin: 0.7 mg/dL (ref 0.3–1.2)
Total Protein: 6.8 g/dL (ref 6.5–8.1)

## 2017-03-21 LAB — GLUCOSE, CAPILLARY
GLUCOSE-CAPILLARY: 161 mg/dL — AB (ref 65–99)
GLUCOSE-CAPILLARY: 107 mg/dL — AB (ref 65–99)
Glucose-Capillary: 150 mg/dL — ABNORMAL HIGH (ref 65–99)
Glucose-Capillary: 127 mg/dL — ABNORMAL HIGH (ref 65–99)

## 2017-03-21 LAB — PROTIME-INR
INR: 1.05
Prothrombin Time: 13.7 seconds (ref 11.4–15.2)

## 2017-03-21 LAB — HEPARIN LEVEL (UNFRACTIONATED)
HEPARIN UNFRACTIONATED: 0.26 [IU]/mL — AB (ref 0.30–0.70)
Heparin Unfractionated: 0.23 IU/mL — ABNORMAL LOW (ref 0.30–0.70)
Heparin Unfractionated: 0.39 IU/mL (ref 0.30–0.70)

## 2017-03-21 LAB — APTT: aPTT: 55 seconds — ABNORMAL HIGH (ref 24–36)

## 2017-03-21 MED ORDER — DOPAMINE-DEXTROSE 3.2-5 MG/ML-% IV SOLN
0.0000 ug/kg/min | INTRAVENOUS | Status: AC
  Administered 2017-03-22: 3 ug/kg/min via INTRAVENOUS
  Filled 2017-03-21: qty 250

## 2017-03-21 MED ORDER — SODIUM CHLORIDE 0.9 % IV SOLN
INTRAVENOUS | Status: AC
  Administered 2017-03-22: 1.9 [IU]/h via INTRAVENOUS
  Filled 2017-03-21: qty 1

## 2017-03-21 MED ORDER — TRANEXAMIC ACID (OHS) BOLUS VIA INFUSION
15.0000 mg/kg | INTRAVENOUS | Status: AC
  Administered 2017-03-22: 2046 mg via INTRAVENOUS
  Filled 2017-03-21: qty 2046

## 2017-03-21 MED ORDER — SODIUM CHLORIDE 0.9 % IV SOLN
INTRAVENOUS | Status: DC
  Filled 2017-03-21: qty 30

## 2017-03-21 MED ORDER — LORAZEPAM 1 MG PO TABS
1.0000 mg | ORAL_TABLET | Freq: Once | ORAL | Status: AC
  Administered 2017-03-21: 1 mg via ORAL
  Filled 2017-03-21: qty 1

## 2017-03-21 MED ORDER — EPINEPHRINE PF 1 MG/ML IJ SOLN
0.0000 ug/min | INTRAVENOUS | Status: DC
  Filled 2017-03-21: qty 4

## 2017-03-21 MED ORDER — SODIUM CHLORIDE 0.9 % IV SOLN
30.0000 ug/min | INTRAVENOUS | Status: DC
  Filled 2017-03-21: qty 2

## 2017-03-21 MED ORDER — POTASSIUM CHLORIDE 2 MEQ/ML IV SOLN
80.0000 meq | INTRAVENOUS | Status: DC
Start: 2017-03-22 — End: 2017-03-22
  Filled 2017-03-21: qty 40

## 2017-03-21 MED ORDER — DEXTROSE 5 % IV SOLN
1.5000 g | INTRAVENOUS | Status: AC
  Administered 2017-03-22: .75 g via INTRAVENOUS
  Administered 2017-03-22: 1.5 g via INTRAVENOUS
  Filled 2017-03-21: qty 1.5

## 2017-03-21 MED ORDER — CEFUROXIME SODIUM 750 MG IJ SOLR
750.0000 mg | INTRAMUSCULAR | Status: DC
  Filled 2017-03-21: qty 750

## 2017-03-21 MED ORDER — PLASMA-LYTE 148 IV SOLN
INTRAVENOUS | Status: AC
  Administered 2017-03-22: 500 mL
  Filled 2017-03-21: qty 2.5

## 2017-03-21 MED ORDER — TRANEXAMIC ACID (OHS) PUMP PRIME SOLUTION
2.0000 mg/kg | INTRAVENOUS | Status: DC
  Filled 2017-03-21: qty 2.73

## 2017-03-21 MED ORDER — VANCOMYCIN HCL 10 G IV SOLR
1250.0000 mg | INTRAVENOUS | Status: AC
  Administered 2017-03-22: 1250 mg via INTRAVENOUS
  Filled 2017-03-21: qty 1250

## 2017-03-21 MED ORDER — TRANEXAMIC ACID 1000 MG/10ML IV SOLN
1.5000 mg/kg/h | INTRAVENOUS | Status: AC
  Administered 2017-03-22: 1.5 mg/kg/h via INTRAVENOUS
  Filled 2017-03-21: qty 25

## 2017-03-21 MED ORDER — DEXMEDETOMIDINE HCL IN NACL 400 MCG/100ML IV SOLN
0.1000 ug/kg/h | INTRAVENOUS | Status: AC
  Administered 2017-03-22: .3 ug/kg/h via INTRAVENOUS
  Filled 2017-03-21: qty 100

## 2017-03-21 MED ORDER — MAGNESIUM SULFATE 50 % IJ SOLN
40.0000 meq | INTRAMUSCULAR | Status: DC
  Filled 2017-03-21: qty 10

## 2017-03-21 MED ORDER — NITROGLYCERIN IN D5W 200-5 MCG/ML-% IV SOLN
2.0000 ug/min | INTRAVENOUS | Status: AC
  Administered 2017-03-22: 10 ug/min via INTRAVENOUS
  Filled 2017-03-21: qty 250

## 2017-03-21 MED ORDER — HEPARIN (PORCINE) IN NACL 100-0.45 UNIT/ML-% IJ SOLN
2050.0000 [IU]/h | INTRAMUSCULAR | Status: DC
  Filled 2017-03-21: qty 250

## 2017-03-21 NOTE — Progress Notes (Signed)
Pt not in room at this time, will come back later for ABG.

## 2017-03-21 NOTE — Plan of Care (Signed)
Problem: Education: Goal: Knowledge of [facility] Education information/materials will improve Outcome: Progressing RN discussed with patient and patient's significant other contact isolation precautions.  RN also discussed plan of treatment for positive MRSA PCR.  RN provided handouts about MRSA and frequently asked questions pertaining to MRSA.  Patient and patient's significant stated understanding.

## 2017-03-21 NOTE — Progress Notes (Signed)
ANTICOAGULATION CONSULT NOTE - Follow Up Consult  Pharmacy Consult for heparin Indication: CAD awaiting CABG  Labs:  Recent Labs  03/19/17 0355 03/20/17 0234 03/21/17 0305 03/21/17 1249 03/21/17 2227  HGB 15.2 14.1 14.2  --   --   HCT 43.8 41.8 41.1  --   --   PLT 179 198 205  --   --   APTT  --   --  55*  --   --   LABPROT  --   --  13.7  --   --   INR  --   --  1.05  --   --   HEPARINUNFRC 0.45 0.35 0.26* 0.23* 0.39  CREATININE  --   --  0.87  --   --     Assessment/Plan:  66yo male therapeutic on heparin after rate changes. Will continue gtt at current rate and confirm stable with am labs.   Wynona Neat, PharmD, BCPS  03/21/2017,11:21 PM

## 2017-03-21 NOTE — Plan of Care (Signed)
Problem: Bowel/Gastric: Goal: Will not experience complications related to bowel motility Outcome: Progressing Per patient report patient has been having daily bowel movements.

## 2017-03-21 NOTE — Progress Notes (Signed)
[place] for  Heparin Indication: chest pain/ACS  Allergies  Allergen Reactions  . No Known Allergies     Patient Measurements: Height: 6\' 2"  (188 cm) Weight: (!) 300 lb 11.2 oz (136.4 kg) IBW/kg (Calculated) : 82.2 Heparin Dosing Weight: 116 kg  Vital Signs: Temp: 98.5 F (36.9 C) (07/01 0455) Temp Source: Oral (07/01 0455) BP: 151/83 (07/01 0455) Pulse Rate: 75 (07/01 0455)  Labs:  Recent Labs  03/19/17 0355 03/20/17 0234 03/21/17 0305  HGB 15.2 14.1 14.2  HCT 43.8 41.8 41.1  PLT 179 198 205  APTT  --   --  55*  LABPROT  --   --  13.7  INR  --   --  1.05  HEPARINUNFRC 0.45 0.35 0.26*    Estimated Creatinine Clearance: 129.9 mL/min (by C-G formula based on SCr of 0.77 mg/dL).    Assessment: 66 year old male s/p cath 6/26 at OSH on heparin for severe CAD with plans for CABG 03/22/17. Not on any AC PTA.  Am heparin level is slightly low at 0.26. CBC stable.   Goal of Therapy:  Heparin level 0.3-0.7 units/ml Monitor platelets by anticoagulation protocol: Yes   Plan:  1. Increase heparin infusion to 1800 units/hr 2. Heparin level in 8 hours   Vincenza Hews, PharmD, BCPS 03/21/2017, 5:14 AM

## 2017-03-21 NOTE — Progress Notes (Addendum)
DAILY PROGRESS NOTE   Patient Name: DEMARRIUS Frost Date of Encounter: 03/21/2017  Hospital Problem List   Principal Problem:   Non-ST elevation (NSTEMI) myocardial infarction [facility]) Active Problems:   Essential hypertension   Morbid obesity ([place])   Obstructive sleep apnea   Right bundle branch block   DM2 (diabetes mellitus, type 2) (HCC)   CAD, multiple vessel   Anxiety   Alcohol abuse   NSTEMI (non-ST elevated myocardial infarction) [facility])    Chief Complaint   Anxious about surgery  Subjective   No events overnight. No further chest pain. Remains on heparin. CABG tomorrow. Sodium is lower today at 130 as is chloride - has been on lasix. Out's not recorded, but urine clear and weight down 2 lbs.  Objective   Vitals:   03/20/17 1656 03/20/17 2100 03/21/17 0005 03/21/17 0455  BP: 122/72 (!) 101/52 139/77 (!) 151/83  Pulse: 73 77 68 75  Resp:  '18 20 18  ' Temp: 98.2 F (36.8 C) 98.2 F (36.8 C) 98.4 F (36.9 C) 98.5 F (36.9 C)  TempSrc: Oral Oral Oral Oral  SpO2: 96% 95% 99% 97%  Weight:    (!) 300 lb 11.2 oz (136.4 kg)  Height:        Intake/Output Summary (Last 24 hours) at 03/21/17 1028 Last data filed at 03/21/17 0946  Gross per 24 hour  Intake          1053.03 ml  Output                0 ml  Net          1053.03 ml   Filed Weights   03/19/17 0641 03/20/17 0409 03/21/17 0455  Weight: (!) 302 lb 6.4 oz (137.2 kg) (!) 302 lb 3.2 oz (137.1 kg) (!) 300 lb 11.2 oz (136.4 kg)    Physical Exam   General appearance: alert and no distress Lungs: clear to auscultation bilaterally Heart: regular rate and rhythm Extremities: extremities normal, atraumatic, no cyanosis or edema Neurologic: Grossly normal  Inpatient Medications    Scheduled Meds: . aspirin EC  81 mg Oral Daily  . atorvastatin  80 mg Oral q1800  . bisacodyl  5 mg Oral Once  . brimonidine  1 drop Both Eyes BID   And  . timolol  1 drop Both Eyes BID  . [START ON 03/22/2017] chlorhexidine  15  mL Mouth/Throat Once  . Chlorhexidine Gluconate Cloth  6 each Topical Once   And  . [START ON 03/22/2017] Chlorhexidine Gluconate Cloth  6 each Topical Once  . Chlorhexidine Gluconate Cloth  6 each Topical Q0600  . folic acid  1 mg Oral Daily  . furosemide  20 mg Oral Daily  . [START ON 03/22/2017] heparin-papaverine-plasmalyte irrigation   Irrigation To OR  . irbesartan  300 mg Oral Daily   And  . hydrochlorothiazide  25 mg Oral Daily  . insulin aspart  0-20 Units Subcutaneous TID WC  . LORazepam  0-4 mg Oral Q12H  . [START ON 03/22/2017] magnesium sulfate  40 mEq Other To OR  . metoprolol succinate  25 mg Oral Daily  . [START ON 03/22/2017] metoprolol tartrate  12.5 mg Oral Once  . multivitamin with minerals  1 tablet Oral Daily  . mupirocin ointment  1 application Nasal BID  . [START ON 03/22/2017] potassium chloride  80 mEq Other To OR  . sodium chloride flush  3 mL Intravenous Q12H  . thiamine  100 mg  Oral Daily  . [START ON 03/22/2017] tranexamic acid  15 mg/kg Intravenous To OR  . [START ON 03/22/2017] tranexamic acid  2 mg/kg Intracatheter To OR    Continuous Infusions: . sodium chloride    . [START ON 03/22/2017] cefUROXime (ZINACEF)  IV    . [START ON 03/22/2017] dexmedetomidine    . [START ON 03/22/2017] DOPamine    . [START ON 03/22/2017] epinephrine    . [START ON 03/22/2017] heparin 30,000 units/NS 1000 mL solution for CELLSAVER    . heparin 1,800 Units/hr (03/21/17 0630)  . [START ON 03/22/2017] insulin (NOVOLIN-R) infusion    . [START ON 03/22/2017] nitroGLYCERIN    . [START ON 03/22/2017] phenylephrine 63m/250mL NS (0.042mml) infusion    . [START ON 03/22/2017] tranexamic acid (CYKLOKAPRON) infusion ([facility])      PRN Meds: sodium chloride, acetaminophen, nitroGLYCERIN, ondansetron (ZOFRAN) IV, sodium chloride flush, temazepam   Labs   Results for orders placed or performed during the hospital encounter of 03/17/17 (from the past 48 hour(s))  Glucose, capillary     Status: Abnormal    Collection Time: 03/19/17 11:13 AM  Result Value Ref Range   Glucose-Capillary 132 (H) 65 - 99 mg/dL  Glucose, capillary     Status: Abnormal   Collection Time: 03/19/17  4:50 PM  Result Value Ref Range   Glucose-Capillary 194 (H) 65 - 99 mg/dL   Comment 1 Notify RN    Comment 2 Document in Chart   Glucose, capillary     Status: Abnormal   Collection Time: 03/19/17  8:18 PM  Result Value Ref Range   Glucose-Capillary 129 (H) 65 - 99 mg/dL  Surgical PCR screen     Status: Abnormal   Collection Time: 03/19/17  8:34 PM  Result Value Ref Range   MRSA, PCR POSITIVE (A) NEGATIVE    Comment: RESULT CALLED TO, READ BACK BY AND VERIFIED WITH: BARNHILL,S RN 1955 03/20/17 MITCHELL,L    Staphylococcus aureus POSITIVE (A) NEGATIVE    Comment:        The Xpert SA Assay (FDA approved for NASAL specimens in patients over 2154ears of age), is one component of a comprehensive surveillance program.  Test performance has been validated by [facility] patients greater than or equal to 1 20ear old. It is not intended to diagnose infection nor to guide or monitor treatment.   Heparin level (unfractionated)     Status: None   Collection Time: 03/20/17  2:34 AM  Result Value Ref Range   Heparin Unfractionated 0.35 0.30 - 0.70 IU/mL    Comment:        IF HEPARIN RESULTS ARE BELOW EXPECTED VALUES, AND PATIENT DOSAGE HAS BEEN CONFIRMED, SUGGEST FOLLOW UP TESTING OF ANTITHROMBIN III LEVELS.   CBC     Status: None   Collection Time: 03/20/17  2:34 AM  Result Value Ref Range   WBC 9.2 4.0 - 10.5 K/uL   RBC 4.54 4.22 - 5.81 MIL/uL   Hemoglobin 14.1 13.0 - 17.0 g/dL   HCT 41.8 39.0 - 52.0 %   MCV 92.1 78.0 - 100.0 fL   MCH 31.1 26.0 - 34.0 pg   MCHC 33.7 30.0 - 36.0 g/dL   RDW 12.1 11.5 - 15.5 %   Platelets 198 150 - 400 K/uL  Glucose, capillary     Status: Abnormal   Collection Time: 03/20/17 11:19 AM  Result Value Ref Range   Glucose-Capillary 155 (H) 65 - 99 mg/dL  Type and screen  [place]     Status: None   Collection Time: 03/20/17  3:28 PM  Result Value Ref Range   ABO/RH(D) A POS    Antibody Screen NEG    Sample Expiration 03/23/2017   ABO/Rh     Status: None   Collection Time: 03/20/17  3:28 PM  Result Value Ref Range   ABO/RH(D) A POS   Glucose, capillary     Status: Abnormal   Collection Time: 03/20/17  4:53 PM  Result Value Ref Range   Glucose-Capillary 136 (H) 65 - 99 mg/dL  Urinalysis, Routine w reflex microscopic     Status: Abnormal   Collection Time: 03/20/17  8:23 PM  Result Value Ref Range   Color, Urine YELLOW YELLOW   APPearance CLEAR CLEAR   Specific Gravity, Urine 1.011 1.005 - 1.030   pH 6.0 5.0 - 8.0   Glucose, UA >=500 (A) NEGATIVE mg/dL   Hgb urine dipstick NEGATIVE NEGATIVE   Bilirubin Urine NEGATIVE NEGATIVE   Ketones, ur NEGATIVE NEGATIVE mg/dL   Protein, ur NEGATIVE NEGATIVE mg/dL   Nitrite NEGATIVE NEGATIVE   Leukocytes, UA NEGATIVE NEGATIVE   RBC / HPF 0-5 0 - 5 RBC/hpf   WBC, UA NONE SEEN 0 - 5 WBC/hpf   Bacteria, UA NONE SEEN NONE SEEN   Squamous Epithelial / LPF NONE SEEN NONE SEEN  Glucose, capillary     Status: Abnormal   Collection Time: 03/20/17  9:25 PM  Result Value Ref Range   Glucose-Capillary 135 (H) 65 - 99 mg/dL  Heparin level (unfractionated)     Status: Abnormal   Collection Time: 03/21/17  3:05 AM  Result Value Ref Range   Heparin Unfractionated 0.26 (L) 0.30 - 0.70 IU/mL    Comment:        IF HEPARIN RESULTS ARE BELOW EXPECTED VALUES, AND PATIENT DOSAGE HAS BEEN CONFIRMED, SUGGEST FOLLOW UP TESTING OF ANTITHROMBIN III LEVELS.   CBC     Status: None   Collection Time: 03/21/17  3:05 AM  Result Value Ref Range   WBC 10.3 4.0 - 10.5 K/uL   RBC 4.55 4.22 - 5.81 MIL/uL   Hemoglobin 14.2 13.0 - 17.0 g/dL   HCT 41.1 39.0 - 52.0 %   MCV 90.3 78.0 - 100.0 fL   MCH 31.2 26.0 - 34.0 pg   MCHC 34.5 30.0 - 36.0 g/dL   RDW 12.0 11.5 - 15.5 %   Platelets 205 150 - 400 K/uL  APTT      Status: Abnormal   Collection Time: 03/21/17  3:05 AM  Result Value Ref Range   aPTT 55 (H) 24 - 36 seconds    Comment:        IF BASELINE aPTT IS ELEVATED, SUGGEST PATIENT RISK ASSESSMENT BE USED TO DETERMINE APPROPRIATE ANTICOAGULANT THERAPY.   Comprehensive metabolic panel     Status: Abnormal   Collection Time: 03/21/17  3:05 AM  Result Value Ref Range   Sodium 130 (L) 135 - 145 mmol/L   Potassium 3.5 3.5 - 5.1 mmol/L   Chloride 92 (L) 101 - 111 mmol/L   CO2 29 22 - 32 mmol/L   Glucose, Bld 129 (H) 65 - 99 mg/dL   BUN 16 6 - 20 mg/dL   Creatinine, Ser 0.87 0.61 - 1.24 mg/dL   Calcium 9.1 8.9 - 10.3 mg/dL   Total Protein 6.8 6.5 - 8.1 g/dL   Albumin 3.6 3.5 - 5.0 g/dL   AST 48 (H) 15 -  41 U/L   ALT 118 (H) 17 - 63 U/L   Alkaline Phosphatase 71 38 - 126 U/L   Total Bilirubin 0.7 0.3 - 1.2 mg/dL   GFR calc non Af Amer >60 >60 mL/min   GFR calc Af Amer >60 >60 mL/min    Comment: (NOTE) The eGFR has been calculated using the CKD EPI equation. This calculation has not been validated in all clinical situations. eGFR's persistently <60 mL/min signify possible Chronic Kidney Disease.    Anion gap 9 5 - 15  Protime-INR     Status: None   Collection Time: 03/21/17  3:05 AM  Result Value Ref Range   Prothrombin Time 13.7 11.4 - 15.2 seconds   INR 1.05   Glucose, capillary     Status: Abnormal   Collection Time: 03/21/17  7:47 AM  Result Value Ref Range   Glucose-Capillary 150 (H) 65 - 99 mg/dL   Comment 1 Document in Chart     ECG   N/A  Telemetry   sinus - Personally Reviewed  Radiology    No results found.  Cardiac Studies   N/A  Assessment   1. Principal Problem: 2.   Non-ST elevation (NSTEMI) myocardial infarction ([place]) 3. Active Problems: 4.   Essential hypertension 5.   Morbid obesity ([place]) 6.   Obstructive sleep apnea 7.   Right bundle branch block 8.   DM2 (diabetes mellitus, type 2) ([place]) 9.   CAD, multiple vessel 10.   Anxiety 11.    Alcohol abuse 12.   NSTEMI (non-ST elevated myocardial infarction) ([place]) 13.   Plan   Plan CABG tomorrow - keep NPO p MN. On heparin, no further chest pain. Hyponatremia - hold lasix.  Time Spent Directly with Patient:  I have spent a total of  15 minutes with the patient reviewing hospital notes, telemetry, EKGs, labs and examining the patient as well as establishing an assessment and plan that was discussed personally with the patient. > 50% of time was spent in direct patient care.  Length of Stay:  LOS: 4 days   Pixie Casino, MD, [place]  Attending Cardiologist  Direct Dial: [phone]  Fax: [phone]  Website:  [email or web address]  03/21/2017, 10:29 AM

## 2017-03-21 NOTE — Progress Notes (Signed)
[place] for  Heparin Indication: chest pain/ACS  Allergies  Allergen Reactions  . No Known Allergies     Patient Measurements: Height: 6\' 2"  (188 cm) Weight: (!) 300 lb 11.2 oz (136.4 kg) IBW/kg (Calculated) : 82.2 Heparin Dosing Weight: 116 kg  Vital Signs: Temp: 98.5 F (36.9 C) (07/01 0455) Temp Source: Oral (07/01 0455) BP: 151/83 (07/01 0455) Pulse Rate: 75 (07/01 0455)  Labs:  Recent Labs  03/19/17 0355 03/20/17 0234 03/21/17 0305 03/21/17 1249  HGB 15.2 14.1 14.2  --   HCT 43.8 41.8 41.1  --   PLT 179 198 205  --   APTT  --   --  55*  --   LABPROT  --   --  13.7  --   INR  --   --  1.05  --   HEPARINUNFRC 0.45 0.35 0.26* 0.23*  CREATININE  --   --  0.87  --     Estimated Creatinine Clearance: 119.4 mL/min (by C-G formula based on SCr of 0.87 mg/dL).    Assessment: 66 year old male s/p cath 6/26 at OSH on heparin for severe CAD with plans for CABG 03/22/17. Not on any AC PTA.  HL remains low with trend down despite rate increase this am. No interruptions/issues with gtt per RN. CBC stable with no reported s/s bleeding.   Goal of Therapy:  Heparin level 0.3-0.7 units/ml Monitor platelets by anticoagulation protocol: Yes   Plan:  1. Increase heparin infusion to 2050 units/hr (~2 units/kg/hr increase) 2. Heparin level in 6 hours 3. Daily HL, CBC 4. CABG planned for Monday   Deana Krock K. Velva Harman, PharmD, BCPS, CPP Clinical Pharmacist Pager: [phone] Phone: [phone] 03/21/2017 2:21 PM

## 2017-03-21 NOTE — Anesthesia Preprocedure Evaluation (Addendum)
Anesthesia Evaluation  Patient identified by MRN, date of birth, ID band Patient awake    Reviewed: Allergy & Precautions, NPO status , Patient's Chart, lab work & pertinent test results  History of Anesthesia Complications Negative for: history of anesthetic complications  Airway Mallampati: III  TM Distance: >3 FB Neck ROM: Full    Dental no notable dental hx. (+) Dental Advisory Given   Pulmonary sleep apnea , former smoker,    Pulmonary exam normal        Cardiovascular hypertension, + angina + CAD and + Past MI  negative cardio ROS Normal cardiovascular exam     Neuro/Psych PSYCHIATRIC DISORDERS Anxiety negative neurological ROS     GI/Hepatic Neg liver ROS, GERD  ,  Endo/Other  diabetesMorbid obesity  Renal/GU negative Renal ROS     Musculoskeletal negative musculoskeletal ROS (+)   Abdominal   Peds  Hematology negative hematology ROS (+)   Anesthesia Other Findings Day of surgery medications reviewed with the patient.  Reproductive/Obstetrics                            Anesthesia Physical Anesthesia Plan  ASA: IV  Anesthesia Plan: General   Post-op Pain Management:    Induction:   PONV Risk Score and Plan: 3 and Ondansetron, Dexamethasone and Diphenhydramine  Airway Management Planned: Oral ETT  Additional Equipment: Arterial line, PA Cath, 3D TEE and Ultrasound Guidance Line Placement  Intra-op Plan:   Post-operative Plan: Post-operative intubation/ventilation  Informed Consent: I have reviewed the patients History and Physical, chart, labs and discussed the procedure including the risks, benefits and alternatives for the proposed anesthesia with the patient or authorized representative who has indicated his/her understanding and acceptance.   Dental advisory given  Plan Discussed with: CRNA, Anesthesiologist and Surgeon  Anesthesia Plan Comments:         Anesthesia Quick Evaluation

## 2017-03-22 ENCOUNTER — Inpatient Hospital Stay (HOSPITAL_COMMUNITY): Payer: Medicare Other | Admitting: Anesthesiology

## 2017-03-22 ENCOUNTER — Inpatient Hospital Stay (HOSPITAL_COMMUNITY): Payer: Medicare Other

## 2017-03-22 ENCOUNTER — Inpatient Hospital Stay (HOSPITAL_COMMUNITY)
Admission: AD | Disposition: A | Discharge status: Home Health | Payer: Self-pay | Source: Other Acute Inpatient Hospital | Attending: Internal Medicine

## 2017-03-22 ENCOUNTER — Encounter (HOSPITAL_COMMUNITY): Payer: Self-pay | Admitting: Anesthesiology

## 2017-03-22 HISTORY — PX: CORONARY ARTERY BYPASS GRAFT: SHX141

## 2017-03-22 HISTORY — PX: TEE WITHOUT CARDIOVERSION: SHX5443

## 2017-03-22 LAB — CBC
HCT: 43 % (ref 39.0–52.0)
HCT: 38.8 % — ABNORMAL LOW (ref 39.0–52.0)
HCT: 40.1 % (ref 39.0–52.0)
Hemoglobin: 15.3 g/dL (ref 13.0–17.0)
Hemoglobin: 13.7 g/dL (ref 13.0–17.0)
Hemoglobin: 14.2 g/dL (ref 13.0–17.0)
MCH: 32.3 pg (ref 26.0–34.0)
MCH: 31.8 pg (ref 26.0–34.0)
MCH: 32 pg (ref 26.0–34.0)
MCHC: 35.6 g/dL (ref 30.0–36.0)
MCHC: 35.3 g/dL (ref 30.0–36.0)
MCHC: 35.4 g/dL (ref 30.0–36.0)
MCV: 90.7 fL (ref 78.0–100.0)
MCV: 90 fL (ref 78.0–100.0)
MCV: 90.3 fL (ref 78.0–100.0)
PLATELETS: 220 10*3/uL (ref 150–400)
PLATELETS: 141 10*3/uL — AB (ref 150–400)
Platelets: 174 10*3/uL (ref 150–400)
RBC: 4.74 MIL/uL (ref 4.22–5.81)
RBC: 4.31 MIL/uL (ref 4.22–5.81)
RBC: 4.44 MIL/uL (ref 4.22–5.81)
RDW: 11.9 % (ref 11.5–15.5)
RDW: 11.8 % (ref 11.5–15.5)
RDW: 11.9 % (ref 11.5–15.5)
WBC: 10.7 10*3/uL — AB (ref 4.0–10.5)
WBC: 9.8 10*3/uL (ref 4.0–10.5)
WBC: 14 10*3/uL — ABNORMAL HIGH (ref 4.0–10.5)

## 2017-03-22 LAB — POCT I-STAT 3, ART BLOOD GAS (G3+)
ACID-BASE EXCESS: 6 mmol/L — AB (ref 0.0–2.0)
Acid-Base Excess: 1 mmol/L (ref 0.0–2.0)
Acid-Base Excess: 7 mmol/L — ABNORMAL HIGH (ref 0.0–2.0)
Acid-Base Excess: 7 mmol/L — ABNORMAL HIGH (ref 0.0–2.0)
BICARBONATE: 29.9 mmol/L — AB (ref 20.0–28.0)
Bicarbonate: 29.9 mmol/L — ABNORMAL HIGH (ref 20.0–28.0)
Bicarbonate: 31.6 mmol/L — ABNORMAL HIGH (ref 20.0–28.0)
Bicarbonate: 32.5 mmol/L — ABNORMAL HIGH (ref 20.0–28.0)
O2 SAT: 94 %
O2 SAT: 99 %
O2 Saturation: 98 %
O2 Saturation: 97 %
PCO2 ART: 69.6 mmHg — AB (ref 32.0–48.0)
PCO2 ART: 38.9 mmHg (ref 32.0–48.0)
PCO2 ART: 47.2 mmHg (ref 32.0–48.0)
PH ART: 7.438 (ref 7.350–7.450)
PH ART: 7.414 (ref 7.350–7.450)
PO2 ART: 88 mmHg (ref 83.0–108.0)
PO2 ART: 110 mmHg — AB (ref 83.0–108.0)
Patient temperature: 37.6
Patient temperature: 37.8
TCO2: 32 mmol/L (ref 0–100)
TCO2: 31 mmol/L (ref 0–100)
TCO2: 33 mmol/L (ref 0–100)
TCO2: 34 mmol/L (ref 0–100)
pCO2 arterial: 50.8 mmHg — ABNORMAL HIGH (ref 32.0–48.0)
pH, Arterial: 7.244 — ABNORMAL LOW (ref 7.350–7.450)
pH, Arterial: 7.494 — ABNORMAL HIGH (ref 7.350–7.450)
pO2, Arterial: 118 mmHg — ABNORMAL HIGH (ref 83.0–108.0)
pO2, Arterial: 91 mmHg (ref 83.0–108.0)

## 2017-03-22 LAB — APTT: APTT: 31 s (ref 24–36)

## 2017-03-22 LAB — PROTIME-INR
INR: 1.28
PROTHROMBIN TIME: 16.1 s — AB (ref 11.4–15.2)

## 2017-03-22 LAB — BASIC METABOLIC PANEL
Anion gap: 9 (ref 5–15)
BUN: 15 mg/dL (ref 6–20)
CO2: 31 mmol/L (ref 22–32)
Calcium: 9.5 mg/dL (ref 8.9–10.3)
Chloride: 90 mmol/L — ABNORMAL LOW (ref 101–111)
Creatinine, Ser: 0.94 mg/dL (ref 0.61–1.24)
GFR calc Af Amer: >60 mL/min (ref >60)
GFR calc non Af Amer: >60 mL/min (ref >60)
Glucose, Bld: 136 mg/dL — ABNORMAL HIGH (ref 65–99)
Potassium: 4 mmol/L (ref 3.5–5.1)
Sodium: 130 mmol/L — ABNORMAL LOW (ref 135–145)

## 2017-03-22 LAB — GLUCOSE, CAPILLARY
GLUCOSE-CAPILLARY: 126 mg/dL — AB (ref 65–99)
GLUCOSE-CAPILLARY: 131 mg/dL — AB (ref 65–99)
Glucose-Capillary: 149 mg/dL — ABNORMAL HIGH (ref 65–99)
Glucose-Capillary: 118 mg/dL — ABNORMAL HIGH (ref 65–99)
Glucose-Capillary: 118 mg/dL — ABNORMAL HIGH (ref 65–99)
Glucose-Capillary: 109 mg/dL — ABNORMAL HIGH (ref 65–99)

## 2017-03-22 LAB — CREATININE, SERUM
Creatinine, Ser: 0.83 mg/dL (ref 0.61–1.24)
GFR calc Af Amer: >60 mL/min (ref >60)
GFR calc non Af Amer: >60 mL/min (ref >60)

## 2017-03-22 LAB — POCT I-STAT, CHEM 8
BUN: 22 mg/dL — ABNORMAL HIGH (ref 6–20)
Calcium, Ion: 1.11 mmol/L — ABNORMAL LOW (ref 1.15–1.40)
Chloride: 94 mmol/L — ABNORMAL LOW (ref 101–111)
Creatinine, Ser: 0.7 mg/dL (ref 0.61–1.24)
Glucose, Bld: 142 mg/dL — ABNORMAL HIGH (ref 65–99)
HCT: 40 % (ref 39.0–52.0)
HEMOGLOBIN: 13.6 g/dL (ref 13.0–17.0)
POTASSIUM: 5.6 mmol/L — AB (ref 3.5–5.1)
SODIUM: 132 mmol/L — AB (ref 135–145)
TCO2: 29 mmol/L (ref 0–100)

## 2017-03-22 LAB — HEMOGLOBIN AND HEMATOCRIT, BLOOD
HCT: 30.5 % — ABNORMAL LOW (ref 39.0–52.0)
Hemoglobin: 10.8 g/dL — ABNORMAL LOW (ref 13.0–17.0)

## 2017-03-22 LAB — MAGNESIUM: Magnesium: 2.4 mg/dL (ref 1.7–2.4)

## 2017-03-22 LAB — POCT I-STAT 4, (NA,K, GLUC, HGB,HCT)
Glucose, Bld: 115 mg/dL — ABNORMAL HIGH (ref 65–99)
HCT: 38 % — ABNORMAL LOW (ref 39.0–52.0)
Hemoglobin: 12.9 g/dL — ABNORMAL LOW (ref 13.0–17.0)
Potassium: 4.5 mmol/L (ref 3.5–5.1)
SODIUM: 134 mmol/L — AB (ref 135–145)

## 2017-03-22 LAB — HEPARIN LEVEL (UNFRACTIONATED): HEPARIN UNFRACTIONATED: 0.54 [IU]/mL (ref 0.30–0.70)

## 2017-03-22 LAB — PLATELET COUNT: Platelets: 157 10*3/uL (ref 150–400)

## 2017-03-22 LAB — POTASSIUM: Potassium: 4.4 mmol/L (ref 3.5–5.1)

## 2017-03-22 SURGERY — CORONARY ARTERY BYPASS GRAFTING (CABG)
Anesthesia: General | Site: Chest

## 2017-03-22 MED ORDER — VANCOMYCIN HCL IN DEXTROSE 1-5 GM/200ML-% IV SOLN
1000.0000 mg | Freq: Once | INTRAVENOUS | Status: AC
  Administered 2017-03-22: 1000 mg via INTRAVENOUS
  Filled 2017-03-22: qty 200

## 2017-03-22 MED ORDER — METOPROLOL TARTRATE 25 MG/10 ML ORAL SUSPENSION
12.5000 mg | Freq: Two times a day (BID) | ORAL | Status: DC
  Administered 2017-03-23: 12.5 mg
  Filled 2017-03-22: qty 5

## 2017-03-22 MED ORDER — BISACODYL 10 MG RE SUPP: 10.0000 mg | Freq: Every day | RECTAL | Status: DC

## 2017-03-22 MED ORDER — DEXTROSE 5 % IV SOLN
INTRAVENOUS | Status: DC | PRN
  Administered 2017-03-22: 08:00:00 via INTRAVENOUS

## 2017-03-22 MED ORDER — DOCUSATE SODIUM 100 MG PO CAPS
200.0000 mg | ORAL_CAPSULE | Freq: Every day | ORAL | Status: DC
  Administered 2017-03-23 – 2017-03-24 (×2): 200 mg via ORAL
  Filled 2017-03-22 (×2): qty 2

## 2017-03-22 MED ORDER — OXYCODONE HCL 5 MG PO TABS
5.0000 mg | ORAL_TABLET | ORAL | Status: DC | PRN
  Administered 2017-03-23: 10 mg via ORAL
  Filled 2017-03-22: qty 2

## 2017-03-22 MED ORDER — 0.9 % SODIUM CHLORIDE (POUR BTL) OPTIME
TOPICAL | Status: DC | PRN
  Administered 2017-03-22: 6000 mL

## 2017-03-22 MED ORDER — SODIUM CHLORIDE 0.45 % IV SOLN: INTRAVENOUS | Status: DC | PRN

## 2017-03-22 MED ORDER — HEMOSTATIC AGENTS (NO CHARGE) OPTIME
TOPICAL | Status: DC | PRN
  Administered 2017-03-22 (×4): 1 via TOPICAL

## 2017-03-22 MED ORDER — DOPAMINE-DEXTROSE 3.2-5 MG/ML-% IV SOLN: 0.0000 ug/kg/min | INTRAVENOUS | Status: AC

## 2017-03-22 MED ORDER — TRAMADOL HCL 50 MG PO TABS
50.0000 mg | ORAL_TABLET | ORAL | Status: DC | PRN
  Administered 2017-03-23 – 2017-03-24 (×5): 100 mg via ORAL
  Filled 2017-03-22 (×5): qty 2

## 2017-03-22 MED ORDER — FAMOTIDINE IN NACL 20-0.9 MG/50ML-% IV SOLN
20.0000 mg | Freq: Two times a day (BID) | INTRAVENOUS | Status: AC
  Administered 2017-03-22: 20 mg via INTRAVENOUS

## 2017-03-22 MED ORDER — SODIUM CHLORIDE 0.9 % IV SOLN
0.0000 ug/min | INTRAVENOUS | Status: DC
  Administered 2017-03-22: 50 ug/min via INTRAVENOUS
  Filled 2017-03-22 (×2): qty 2

## 2017-03-22 MED ORDER — ASPIRIN 81 MG PO CHEW
324.0000 mg | CHEWABLE_TABLET | Freq: Every day | ORAL | Status: DC
  Administered 2017-03-23: 324 mg
  Filled 2017-03-22: qty 4

## 2017-03-22 MED ORDER — ORAL CARE MOUTH RINSE
15.0000 mL | Freq: Four times a day (QID) | OROMUCOSAL | Status: DC
  Administered 2017-03-23: 15 mL via OROMUCOSAL

## 2017-03-22 MED ORDER — METOCLOPRAMIDE HCL 5 MG/ML IJ SOLN
10.0000 mg | Freq: Four times a day (QID) | INTRAMUSCULAR | Status: AC
  Administered 2017-03-22 – 2017-03-23 (×4): 10 mg via INTRAVENOUS
  Filled 2017-03-22 (×4): qty 2

## 2017-03-22 MED ORDER — SODIUM CHLORIDE 0.9% FLUSH: 10.0000 mL | INTRAVENOUS | Status: DC | PRN

## 2017-03-22 MED ORDER — METOPROLOL TARTRATE 12.5 MG HALF TABLET
12.5000 mg | ORAL_TABLET | Freq: Two times a day (BID) | ORAL | Status: DC
  Administered 2017-03-23: 12.5 mg via ORAL
  Filled 2017-03-22 (×2): qty 1

## 2017-03-22 MED ORDER — LACTATED RINGERS IV SOLN: INTRAVENOUS | Status: DC

## 2017-03-22 MED ORDER — MIDAZOLAM HCL 2 MG/2ML IJ SOLN
2.0000 mg | INTRAMUSCULAR | Status: DC | PRN
Start: 2017-03-22 — End: 2017-03-24
  Filled 2017-03-22: qty 2

## 2017-03-22 MED ORDER — POTASSIUM CHLORIDE 10 MEQ/50ML IV SOLN: 10.0000 meq | INTRAVENOUS | Status: AC

## 2017-03-22 MED ORDER — ACETAMINOPHEN 500 MG PO TABS
1000.0000 mg | ORAL_TABLET | Freq: Four times a day (QID) | ORAL | Status: DC
  Administered 2017-03-23 – 2017-03-24 (×6): 1000 mg via ORAL
  Filled 2017-03-22 (×6): qty 2

## 2017-03-22 MED ORDER — ONDANSETRON HCL 4 MG/2ML IJ SOLN: 4.0000 mg | Freq: Four times a day (QID) | INTRAMUSCULAR | Status: DC | PRN

## 2017-03-22 MED ORDER — CHLORHEXIDINE GLUCONATE 0.12 % MT SOLN
15.0000 mL | OROMUCOSAL | Status: AC
  Administered 2017-03-22: 15 mL via OROMUCOSAL

## 2017-03-22 MED ORDER — ACETAMINOPHEN 160 MG/5ML PO SOLN: 1000.0000 mg | Freq: Four times a day (QID) | ORAL | Status: DC

## 2017-03-22 MED ORDER — ALBUMIN HUMAN 5 % IV SOLN
INTRAVENOUS | Status: DC | PRN
  Administered 2017-03-22: 14:00:00 via INTRAVENOUS

## 2017-03-22 MED ORDER — PROPOFOL 10 MG/ML IV BOLUS
INTRAVENOUS | Status: DC | PRN
  Administered 2017-03-22: 140 mg via INTRAVENOUS

## 2017-03-22 MED ORDER — ARTIFICIAL TEARS OPHTHALMIC OINT
TOPICAL_OINTMENT | OPHTHALMIC | Status: DC | PRN
  Administered 2017-03-22: 1 via OPHTHALMIC

## 2017-03-22 MED ORDER — VECURONIUM BROMIDE 10 MG IV SOLR
INTRAVENOUS | Status: DC | PRN
  Administered 2017-03-22 (×4): 5 mg via INTRAVENOUS
  Administered 2017-03-22: 10 mg via INTRAVENOUS
  Administered 2017-03-22 (×2): 5 mg via INTRAVENOUS

## 2017-03-22 MED ORDER — MIDAZOLAM HCL 5 MG/5ML IJ SOLN
INTRAMUSCULAR | Status: DC | PRN
  Administered 2017-03-22: 3 mg via INTRAVENOUS
  Administered 2017-03-22: 2 mg via INTRAVENOUS
  Administered 2017-03-22: 1 mg via INTRAVENOUS
  Administered 2017-03-22: 3 mg via INTRAVENOUS
  Administered 2017-03-22: 1 mg via INTRAVENOUS
  Administered 2017-03-22: 5 mg via INTRAVENOUS
  Administered 2017-03-22: 2 mg via INTRAVENOUS
  Administered 2017-03-22: 3 mg via INTRAVENOUS

## 2017-03-22 MED ORDER — NITROGLYCERIN IN D5W 200-5 MCG/ML-% IV SOLN: 0.0000 ug/min | INTRAVENOUS | Status: DC

## 2017-03-22 MED ORDER — SODIUM CHLORIDE 0.9% FLUSH
3.0000 mL | Freq: Two times a day (BID) | INTRAVENOUS | Status: DC
  Administered 2017-03-23: 3 mL via INTRAVENOUS

## 2017-03-22 MED ORDER — CHLORHEXIDINE GLUCONATE CLOTH 2 % EX PADS
6.0000 | MEDICATED_PAD | Freq: Every day | CUTANEOUS | Status: DC
  Administered 2017-03-23 – 2017-03-24 (×2): 6 via TOPICAL
  Filled 2017-03-22: qty 6

## 2017-03-22 MED ORDER — PHENYLEPHRINE HCL 10 MG/ML IJ SOLN
INTRAVENOUS | Status: DC | PRN
  Administered 2017-03-22: 25 ug/min via INTRAVENOUS

## 2017-03-22 MED ORDER — BISACODYL 5 MG PO TBEC
10.0000 mg | DELAYED_RELEASE_TABLET | Freq: Every day | ORAL | Status: DC
  Administered 2017-03-23 – 2017-03-24 (×2): 10 mg via ORAL
  Filled 2017-03-22 (×2): qty 2

## 2017-03-22 MED ORDER — MAGNESIUM SULFATE 4 GM/100ML IV SOLN
4.0000 g | Freq: Once | INTRAVENOUS | Status: AC
  Administered 2017-03-22: 4 g via INTRAVENOUS
  Filled 2017-03-22: qty 100

## 2017-03-22 MED ORDER — TRANEXAMIC ACID 1000 MG/10ML IV SOLN
1.5000 mg/kg/h | INTRAVENOUS | Status: DC
  Filled 2017-03-22: qty 25

## 2017-03-22 MED ORDER — HEPARIN SODIUM (PORCINE) 1000 UNIT/ML IJ SOLN
INTRAMUSCULAR | Status: DC | PRN
  Administered 2017-03-22: 66000 [IU] via INTRAVENOUS

## 2017-03-22 MED ORDER — FENTANYL CITRATE (PF) 250 MCG/5ML IJ SOLN
INTRAMUSCULAR | Status: DC | PRN
  Administered 2017-03-22 (×2): 250 ug via INTRAVENOUS
  Administered 2017-03-22: 200 ug via INTRAVENOUS
  Administered 2017-03-22 (×3): 250 ug via INTRAVENOUS
  Administered 2017-03-22 (×2): 100 ug via INTRAVENOUS
  Administered 2017-03-22: 50 ug via INTRAVENOUS
  Administered 2017-03-22: 250 ug via INTRAVENOUS
  Administered 2017-03-22: 50 ug via INTRAVENOUS

## 2017-03-22 MED ORDER — SODIUM CHLORIDE 0.9 % IV SOLN: 250.0000 mL | INTRAVENOUS | Status: DC

## 2017-03-22 MED ORDER — LACTATED RINGERS IV SOLN
INTRAVENOUS | Status: DC | PRN
  Administered 2017-03-22: 07:00:00 via INTRAVENOUS

## 2017-03-22 MED ORDER — SODIUM CHLORIDE 0.9 % IV SOLN
INTRAVENOUS | Status: DC | PRN
  Administered 2017-03-22: 08:00:00 via INTRAVENOUS

## 2017-03-22 MED ORDER — SODIUM CHLORIDE 0.9% FLUSH
10.0000 mL | Freq: Two times a day (BID) | INTRAVENOUS | Status: DC
  Administered 2017-03-23: 10 mL

## 2017-03-22 MED ORDER — LACTATED RINGERS IV SOLN: 500.0000 mL | Freq: Once | INTRAVENOUS | Status: DC | PRN

## 2017-03-22 MED ORDER — ALBUTEROL SULFATE HFA 108 (90 BASE) MCG/ACT IN AERS
INHALATION_SPRAY | RESPIRATORY_TRACT | Status: DC | PRN
  Administered 2017-03-22: 4 via RESPIRATORY_TRACT

## 2017-03-22 MED ORDER — DEXTROSE 5 % IV SOLN
1.5000 g | Freq: Two times a day (BID) | INTRAVENOUS | Status: AC
  Administered 2017-03-22 – 2017-03-24 (×4): 1.5 g via INTRAVENOUS
  Filled 2017-03-22 (×4): qty 1.5

## 2017-03-22 MED ORDER — SODIUM CHLORIDE 0.9 % IV SOLN: INTRAVENOUS | Status: DC

## 2017-03-22 MED ORDER — PANTOPRAZOLE SODIUM 40 MG PO TBEC
40.0000 mg | DELAYED_RELEASE_TABLET | Freq: Every day | ORAL | Status: DC
  Administered 2017-03-24: 40 mg via ORAL
  Filled 2017-03-22: qty 1

## 2017-03-22 MED ORDER — SODIUM CHLORIDE 0.9% FLUSH: 3.0000 mL | INTRAVENOUS | Status: DC | PRN

## 2017-03-22 MED ORDER — MORPHINE SULFATE (PF) 4 MG/ML IV SOLN
1.0000 mg | INTRAVENOUS | Status: AC | PRN
  Filled 2017-03-22: qty 1

## 2017-03-22 MED ORDER — PROTAMINE SULFATE 10 MG/ML IV SOLN
INTRAVENOUS | Status: DC | PRN
  Administered 2017-03-22: 400 mg via INTRAVENOUS

## 2017-03-22 MED ORDER — ALBUMIN HUMAN 5 % IV SOLN
250.0000 mL | INTRAVENOUS | Status: AC | PRN
  Administered 2017-03-22 – 2017-03-23 (×2): 250 mL via INTRAVENOUS
  Filled 2017-03-22: qty 250

## 2017-03-22 MED ORDER — ROCURONIUM BROMIDE 100 MG/10ML IV SOLN
INTRAVENOUS | Status: DC | PRN
  Administered 2017-03-22: 100 mg via INTRAVENOUS

## 2017-03-22 MED ORDER — ACETAMINOPHEN 650 MG RE SUPP
650.0000 mg | Freq: Once | RECTAL | Status: AC
  Administered 2017-03-22: 650 mg via RECTAL

## 2017-03-22 MED ORDER — ASPIRIN EC 325 MG PO TBEC
325.0000 mg | DELAYED_RELEASE_TABLET | Freq: Every day | ORAL | Status: DC
  Filled 2017-03-22: qty 1

## 2017-03-22 MED ORDER — MORPHINE SULFATE (PF) 4 MG/ML IV SOLN
2.0000 mg | INTRAVENOUS | Status: DC | PRN
  Administered 2017-03-22 – 2017-03-23 (×3): 4 mg via INTRAVENOUS
  Filled 2017-03-22 (×2): qty 1

## 2017-03-22 MED ORDER — ACETAMINOPHEN 160 MG/5ML PO SOLN: 650.0000 mg | Freq: Once | ORAL | Status: AC

## 2017-03-22 MED ORDER — CHLORHEXIDINE GLUCONATE 0.12% ORAL RINSE (MEDLINE KIT)
15.0000 mL | Freq: Two times a day (BID) | OROMUCOSAL | Status: DC
  Administered 2017-03-22: 15 mL via OROMUCOSAL

## 2017-03-22 MED ORDER — SODIUM CHLORIDE 0.9 % IV SOLN
INTRAVENOUS | Status: DC
  Administered 2017-03-22: 3.3 [IU]/h via INTRAVENOUS
  Filled 2017-03-22: qty 1

## 2017-03-22 MED ORDER — SODIUM CHLORIDE 0.9 % IV SOLN
0.0000 ug/kg/h | INTRAVENOUS | Status: DC
  Administered 2017-03-22: 0.7 ug/kg/h via INTRAVENOUS
  Filled 2017-03-22: qty 2

## 2017-03-22 MED ORDER — METOPROLOL TARTRATE 5 MG/5ML IV SOLN: 2.5000 mg | INTRAVENOUS | Status: DC | PRN

## 2017-03-22 MED ORDER — INSULIN REGULAR BOLUS VIA INFUSION
0.0000 [IU] | Freq: Three times a day (TID) | INTRAVENOUS | Status: DC
  Filled 2017-03-22: qty 10

## 2017-03-22 MED FILL — Heparin Sodium (Porcine) Inj 1000 Unit/ML: INTRAMUSCULAR | Qty: 30 | Status: AC

## 2017-03-22 MED FILL — Magnesium Sulfate Inj 50%: INTRAMUSCULAR | Qty: 10 | Status: AC

## 2017-03-22 MED FILL — Potassium Chloride Inj 2 mEq/ML: INTRAVENOUS | Qty: 10 | Status: AC

## 2017-03-22 SURGICAL SUPPLY — 85 items
ADH SKN CLS LQ APL DERMABOND (GAUZE/BANDAGES/DRESSINGS) ×2
APL SKNCLS STERI-STRIP NONHPOA (GAUZE/BANDAGES/DRESSINGS) ×2
BAG DECANTER FOR FLEXI CONT (MISCELLANEOUS) ×3 IMPLANT
BANDAGE ACE 4X5 VEL STRL LF (GAUZE/BANDAGES/DRESSINGS) ×3 IMPLANT
BANDAGE ACE 6X5 VEL STRL LF (GAUZE/BANDAGES/DRESSINGS) ×3 IMPLANT
BANDAGE ELASTIC 4 VELCRO ST LF (GAUZE/BANDAGES/DRESSINGS) ×2 IMPLANT
BANDAGE ELASTIC 6 VELCRO ST LF (GAUZE/BANDAGES/DRESSINGS) ×2 IMPLANT
BENZOIN TINCTURE PRP APPL 2/3 (GAUZE/BANDAGES/DRESSINGS) ×1 IMPLANT
BLADE STERNUM SYSTEM 6 (BLADE) ×3 IMPLANT
BNDG GAUZE ELAST 4 BULKY (GAUZE/BANDAGES/DRESSINGS) ×4 IMPLANT
CANISTER SUCT 3000ML PPV (MISCELLANEOUS) ×3 IMPLANT
CANNULA ARTERIAL NVNT 3/8 24FR (CANNULA) ×1 IMPLANT
CANNULA VESSEL 3MM 2 BLNT TIP (CANNULA) ×1 IMPLANT
CATH CPB KIT GERHARDT (MISCELLANEOUS) ×3 IMPLANT
CATH THORACIC 28FR (CATHETERS) ×3 IMPLANT
CLIP FOGARTY SPRING 6M (CLIP) ×1 IMPLANT
CRADLE DONUT ADULT HEAD (MISCELLANEOUS) ×3 IMPLANT
DERMABOND ADHESIVE PROPEN (GAUZE/BANDAGES/DRESSINGS) ×1
DERMABOND ADVANCED .7 DNX6 (GAUZE/BANDAGES/DRESSINGS) IMPLANT
DRAIN CHANNEL 28F RND 3/8 FF (WOUND CARE) ×3 IMPLANT
DRAPE CARDIOVASCULAR INCISE (DRAPES) ×3
DRAPE SLUSH/WARMER DISC (DRAPES) ×3 IMPLANT
DRAPE SRG 135X102X78XABS (DRAPES) ×2 IMPLANT
DRSG AQUACEL AG ADV 3.5X14 (GAUZE/BANDAGES/DRESSINGS) ×3 IMPLANT
ELECT BLADE 4.0 EZ CLEAN MEGAD (MISCELLANEOUS) ×3
ELECT REM PT RETURN 9FT ADLT (ELECTROSURGICAL) ×6
ELECTRODE BLDE 4.0 EZ CLN MEGD (MISCELLANEOUS) ×2 IMPLANT
ELECTRODE REM PT RTRN 9FT ADLT (ELECTROSURGICAL) ×4 IMPLANT
FELT TEFLON 1X6 (MISCELLANEOUS) ×6 IMPLANT
GAUZE SPONGE 4X4 12PLY STRL (GAUZE/BANDAGES/DRESSINGS) ×6 IMPLANT
GAUZE SPONGE 4X4 12PLY STRL LF (GAUZE/BANDAGES/DRESSINGS) ×3 IMPLANT
GLOVE BIO SURGEON STRL SZ 6.5 (GLOVE) ×16 IMPLANT
GLOVE BIOGEL PI IND STRL 6 (GLOVE) IMPLANT
GLOVE BIOGEL PI IND STRL 6.5 (GLOVE) IMPLANT
GLOVE BIOGEL PI INDICATOR 6 (GLOVE) ×5
GLOVE BIOGEL PI INDICATOR 6.5 (GLOVE) ×5
GOWN STRL REUS W/ TWL LRG LVL3 (GOWN DISPOSABLE) ×8 IMPLANT
GOWN STRL REUS W/TWL LRG LVL3 (GOWN DISPOSABLE) ×30
HEMOSTAT POWDER SURGIFOAM 1G (HEMOSTASIS) ×9 IMPLANT
HEMOSTAT SURGICEL 2X14 (HEMOSTASIS) ×3 IMPLANT
KIT BASIN OR (CUSTOM PROCEDURE TRAY) ×3 IMPLANT
KIT CATH SUCT 8FR (CATHETERS) ×3 IMPLANT
KIT ROOM TURNOVER OR (KITS) ×3 IMPLANT
KIT SUCTION CATH 14FR (SUCTIONS) ×7 IMPLANT
KIT VASOVIEW HEMOPRO VH 3000 (KITS) ×3 IMPLANT
LEAD PACING MYOCARDI (MISCELLANEOUS) ×3 IMPLANT
MARKER GRAFT CORONARY BYPASS (MISCELLANEOUS) ×10 IMPLANT
NS IRRIG 1000ML POUR BTL (IV SOLUTION) ×15 IMPLANT
PACK OPEN HEART (CUSTOM PROCEDURE TRAY) ×3 IMPLANT
PAD ARMBOARD 7.5X6 YLW CONV (MISCELLANEOUS) ×6 IMPLANT
PAD ELECT DEFIB RADIOL ZOLL (MISCELLANEOUS) ×3 IMPLANT
PENCIL BUTTON HOLSTER BLD 10FT (ELECTRODE) ×3 IMPLANT
PUNCH AORTIC ROTATE  4.5MM 8IN (MISCELLANEOUS) ×1 IMPLANT
SET CARDIOPLEGIA MPS 5001102 (MISCELLANEOUS) ×1 IMPLANT
SPONGE LAP 18X18 X RAY DECT (DISPOSABLE) ×4 IMPLANT
SURGIFLO W/THROMBIN 8M KIT (HEMOSTASIS) ×1 IMPLANT
SUT BONE WAX W31G (SUTURE) ×3 IMPLANT
SUT ETHILON 3 0 FSL (SUTURE) ×1 IMPLANT
SUT MNCRL AB 4-0 PS2 18 (SUTURE) ×2 IMPLANT
SUT PROLENE 3 0 SH1 36 (SUTURE) ×3 IMPLANT
SUT PROLENE 4 0 TF (SUTURE) ×6 IMPLANT
SUT PROLENE 5 0 C 1 36 (SUTURE) ×1 IMPLANT
SUT PROLENE 6 0 C 1 30 (SUTURE) ×4 IMPLANT
SUT PROLENE 6 0 CC (SUTURE) ×6 IMPLANT
SUT PROLENE 7 0 BV1 MDA (SUTURE) ×5 IMPLANT
SUT PROLENE 8 0 BV175 6 (SUTURE) ×1 IMPLANT
SUT SILK 2 0 SH CR/8 (SUTURE) ×1 IMPLANT
SUT STEEL 6MS V (SUTURE) ×3 IMPLANT
SUT STEEL STERNAL CCS#1 18IN (SUTURE) ×1 IMPLANT
SUT STEEL SZ 6 DBL 3X14 BALL (SUTURE) ×4 IMPLANT
SUT VIC AB 1 CTX 18 (SUTURE) ×6 IMPLANT
SUT VIC AB 2-0 CT1 27 (SUTURE) ×6
SUT VIC AB 2-0 CT1 TAPERPNT 27 (SUTURE) IMPLANT
SUTURE E-PAK OPEN HEART (SUTURE) ×3 IMPLANT
SYSTEM SAHARA CHEST DRAIN ATS (WOUND CARE) ×3 IMPLANT
TAPE CLOTH SURG 4X10 WHT LF (GAUZE/BANDAGES/DRESSINGS) ×1 IMPLANT
TAPE PAPER 2X10 WHT MICROPORE (GAUZE/BANDAGES/DRESSINGS) ×1 IMPLANT
TOWEL GREEN STERILE (TOWEL DISPOSABLE) ×12 IMPLANT
TOWEL GREEN STERILE FF (TOWEL DISPOSABLE) ×6 IMPLANT
TOWEL OR 17X24 6PK STRL BLUE (TOWEL DISPOSABLE) ×6 IMPLANT
TOWEL OR 17X26 10 PK STRL BLUE (TOWEL DISPOSABLE) ×6 IMPLANT
TRAY FOLEY SILVER 16FR TEMP (SET/KITS/TRAYS/PACK) ×3 IMPLANT
TUBING INSUFFLATION (TUBING) ×3 IMPLANT
UNDERPAD 30X30 (UNDERPADS AND DIAPERS) ×3 IMPLANT
WATER STERILE IRR 1000ML POUR (IV SOLUTION) ×6 IMPLANT

## 2017-03-22 NOTE — Anesthesia Postprocedure Evaluation (Signed)
Anesthesia Post Note  Patient: David Frost  Procedure(s) Performed: Procedure(s) (LRB): CORONARY ARTERY BYPASS GRAFTING (CABG) x6, using the left internal mammary artery and the greater saphenous vein harvested endoscopically from the right thigh and calf and the left thigh. -LIMA to LAD -SVG to RAMUS INTERMEDIATE -SEQ SVG to OM1 and DISTAL LEFT CIRCUMFLEX -SEQ SVG to PDA and PLVB (N/A) TRANSESOPHAGEAL ECHOCARDIOGRAM (TEE) (N/A)     Patient location during evaluation: SICU Anesthesia Type: General Level of consciousness: sedated Pain management: pain level controlled Vital Signs Assessment: post-procedure vital signs reviewed and stable Respiratory status: patient remains intubated per anesthesia plan Cardiovascular status: stable Anesthetic complications: no    Last Vitals:  Vitals:   03/22/17 1815 03/22/17 1830  BP: 97/66   Pulse: 89 89  Resp: 18 18  Temp: 37.9 C 37.9 C    Last Pain:  Vitals:   03/22/17 0442  TempSrc: Oral  PainSc:                  , DANIEL

## 2017-03-22 NOTE — Transfer of Care (Signed)
Immediate Anesthesia Transfer of Care Note  Patient: David Frost  Procedure(s) Performed: Procedure(s): CORONARY ARTERY BYPASS GRAFTING (CABG) x6, using the left internal mammary artery and the greater saphenous vein harvested endoscopically from the right thigh and calf and the left thigh. -LIMA to LAD -SVG to RAMUS INTERMEDIATE -SEQ SVG to OM1 and DISTAL LEFT CIRCUMFLEX -SEQ SVG to PDA and PLVB (N/A) TRANSESOPHAGEAL ECHOCARDIOGRAM (TEE) (N/A)  Patient Location: SICU  Anesthesia Type:General  Level of Consciousness: sedated, responds to stimulation and Patient remains intubated per anesthesia plan  Airway & Oxygen Therapy: Patient remains intubated per anesthesia plan and Patient placed on Ventilator (see vital sign flow sheet for setting)  Post-op Assessment: Report given to RN and Post -op Vital signs reviewed and stable  Post vital signs: Reviewed and stable  Last Vitals:  Vitals:   03/22/17 0442 03/22/17 1549  BP: (!) 169/95 (!) 112/57  Pulse: 78   Resp: 18   Temp: 36.6 C     Last Pain:  Vitals:   03/22/17 0442  TempSrc: Oral  PainSc:       Patients Stated Pain Goal: 0 (91/66/06 0045)  Complications: No apparent anesthesia complications

## 2017-03-22 NOTE — Plan of Care (Signed)
Problem: Safety: Goal: Ability to remain free from injury will improve Outcome: Completed/Met Date Met: 03/22/17 Pt is oriented x4/gait steady/low fall risk. Pt independently ambulates in room.

## 2017-03-22 NOTE — Progress Notes (Signed)
  Echocardiogram Echocardiogram Transesophageal has been performed.  David Frost 03/22/2017, 9:55 AM

## 2017-03-22 NOTE — Anesthesia Procedure Notes (Signed)
Arterial Line Insertion Start/End7/10/2016 7:15 AM Performed by: CRNA  Patient location: Pre-op. Preanesthetic checklist: patient identified and pre-op evaluation Lidocaine 1% used for infiltration and patient sedated Left, radial was placed Catheter size: 20 G Hand hygiene performed  and maximum sterile barriers used   Attempts: 1 Procedure performed without using ultrasound guided technique. Following insertion, dressing applied and Biopatch. Post procedure assessment: unchanged  Patient tolerated the procedure well with no immediate complications.

## 2017-03-22 NOTE — Progress Notes (Signed)
RT NOTE:   Rapid Wean Cardiac initiated

## 2017-03-22 NOTE — Anesthesia Procedure Notes (Signed)
Procedure Name: Intubation Date/Time: 03/22/2017 7:38 AM Performed by: Jacquiline Doe A Pre-anesthesia Checklist: Patient identified, Emergency Drugs available, Suction available and Patient being monitored Patient Re-evaluated:Patient Re-evaluated prior to inductionOxygen Delivery Method: Circle System Utilized and Circle system utilized Preoxygenation: Pre-oxygenation with 100% oxygen Intubation Type: IV induction Ventilation: Mask ventilation without difficulty Laryngoscope Size: Mac and 4 Grade View: Grade II Tube type: Oral Tube size: 8.0 mm Number of attempts: 1 Airway Equipment and Method: Stylet Placement Confirmation: ETT inserted through vocal cords under direct vision,  positive ETCO2 and breath sounds checked- equal and bilateral Secured at: 23 cm Tube secured with: Tape Dental Injury: Teeth and Oropharynx as per pre-operative assessment

## 2017-03-22 NOTE — Progress Notes (Signed)
      [address]       [place] [address]             [phone]       Intubated and sedated after CABG x 6  BP 104/62   Pulse 89   Temp 100 F (37.8 C)   Resp 18   Ht 6\' 2"  (1.88 m)   Wt 299 lb 11.2 oz (135.9 kg)   SpO2 100%   BMI 38.48 kg/m    Intake/Output Summary (Last 24 hours) at 03/22/17 1756 Last data filed at 03/22/17 1707  Gross per 24 hour  Intake          6806.91 ml  Output             1775 ml  Net          5031.91 ml   CI= 2  PCO2 69 on initial ABG now down to 39   C. Roxan Hockey, MD [facility] [phone]

## 2017-03-22 NOTE — Brief Op Note (Addendum)
      [address]       [place] [address]             [phone]       03/22/2017  12:52 PM  PATIENT:  David Frost  66 y.o. male  PRE-OPERATIVE DIAGNOSIS:  CAD  POST-OPERATIVE DIAGNOSIS:  CAD  PROCEDURE:  Procedure(s):  CORONARY ARTERY BYPASS GRAFTING x 6 -LIMA to LAD -SVG to RAMUS INTERMEDIATE -SEQ SVG to OM1 and DISTAL LEFT CIRCUMFLEX -SEQ SVG to PDA and PLVB  ENDOSCOPIC HARVEST GREATER SAPHENOUS VEIN  -Right Leg and Left Thigh  TRANSESOPHAGEAL ECHOCARDIOGRAM (TEE) (N/A)  SURGEON:  Surgeon(s) and Role:    * [name], MD - Primary  PHYSICIAN ASSISTANT: Ellwood Handler PA-C  ANESTHESIA:   general  EBL:  Total I/O In: 2300 [I.V.:2300] Out: 300 [Urine:300]  BLOOD ADMINISTERED: CELLSAVER  DRAINS: Left Pleural Chest Tube, mediastinal chest drains   LOCAL MEDICATIONS USED:  NONE  SPECIMEN:  No Specimen  COUNTS:  YES  DICTATION: .Dragon Dictation  PLAN OF CARE: Admit to inpatient   PATIENT DISPOSITION:  ICU - intubated and hemodynamically stable.   Delay start of Pharmacological VTE agent (>24hrs) due to surgical blood loss or risk of bleeding: yes

## 2017-03-22 NOTE — Anesthesia Procedure Notes (Signed)
Central Venous Catheter Insertion Performed by: Duane Boston, anesthesiologist Start/End7/10/2016 6:46 AM, 03/22/2017 6:56 AM Patient location: Pre-op. Preanesthetic checklist: patient identified, IV checked, site marked, risks and benefits discussed, surgical consent, monitors and equipment checked, pre-op evaluation, timeout performed and anesthesia consent Position: Trendelenburg Lidocaine 1% used for infiltration and patient sedated Hand hygiene performed , maximum sterile barriers used  and Seldinger technique used Catheter size: 8.5 Fr Total catheter length 8. PA cath was placed.Sheath introducer Swan type:thermodilution PA Cath depth:50 Procedure performed using ultrasound guided technique. Ultrasound Notes:anatomy identified, needle tip was noted to be adjacent to the nerve/plexus identified, no ultrasound evidence of intravascular and/or intraneural injection and image(s) printed for medical record Attempts: 1 Following insertion, line sutured and dressing applied. Post procedure assessment: free fluid flow, blood return through all ports and no air  Patient tolerated the procedure well with no immediate complications.

## 2017-03-22 NOTE — Progress Notes (Signed)
Patient ID: David Frost, male   DOB: [birth year], 66 y.o.   MRN: [id number]       [address]       [place] [address]             [phone]     Pre Procedure note for inpatients:   David Frost has been scheduled for Procedure(s): CORONARY ARTERY BYPASS GRAFTING (CABG) (N/A) TRANSESOPHAGEAL ECHOCARDIOGRAM (TEE) (N/A) today. The various methods of treatment have been discussed with the patient. After consideration of the risks, benefits and treatment options the patient has consented to the planned procedure.   The patient has been seen and labs reviewed. There are no changes in the patient's condition to prevent proceeding with the planned procedure today.  Recent labs:  Lab Results  Component Value Date   WBC 10.7 (H) 03/22/2017   HGB 15.3 03/22/2017   HCT 43.0 03/22/2017   PLT 220 03/22/2017   GLUCOSE 136 (H) 03/22/2017   CHOL 190 03/18/2017   TRIG 331 (H) 03/18/2017   HDL 29 (L) 03/18/2017   LDLCALC 95 03/18/2017   ALT 118 (H) 03/21/2017   AST 48 (H) 03/21/2017   NA 130 (L) 03/22/2017   K 4.0 03/22/2017   CL 90 (L) 03/22/2017   CREATININE 0.94 03/22/2017   BUN 15 03/22/2017   CO2 31 03/22/2017   INR 1.05 03/21/2017   HGBA1C 6.0 (H) 03/18/2017    [name], MD 03/22/2017 6:49 AM

## 2017-03-23 ENCOUNTER — Inpatient Hospital Stay (HOSPITAL_COMMUNITY): Payer: Medicare Other

## 2017-03-23 ENCOUNTER — Encounter (HOSPITAL_COMMUNITY): Payer: Self-pay | Admitting: Cardiothoracic Surgery

## 2017-03-23 LAB — BASIC METABOLIC PANEL
Anion gap: 5 (ref 5–15)
BUN: 14 mg/dL (ref 6–20)
CALCIUM: 8 mg/dL — AB (ref 8.9–10.3)
CO2: 27 mmol/L (ref 22–32)
Chloride: 98 mmol/L — ABNORMAL LOW (ref 101–111)
Creatinine, Ser: 0.69 mg/dL (ref 0.61–1.24)
GLUCOSE: 117 mg/dL — AB (ref 65–99)
Potassium: 4.2 mmol/L (ref 3.5–5.1)
Sodium: 130 mmol/L — ABNORMAL LOW (ref 135–145)

## 2017-03-23 LAB — POCT I-STAT, CHEM 8
BUN: 18 mg/dL (ref 6–20)
BUN: 15 mg/dL (ref 6–20)
BUN: 17 mg/dL (ref 6–20)
BUN: 17 mg/dL (ref 6–20)
BUN: 18 mg/dL (ref 6–20)
BUN: 17 mg/dL (ref 6–20)
BUN: 19 mg/dL (ref 6–20)
CALCIUM ION: 1.18 mmol/L (ref 1.15–1.40)
CALCIUM ION: 1.1 mmol/L — AB (ref 1.15–1.40)
CALCIUM ION: 1.1 mmol/L — AB (ref 1.15–1.40)
CALCIUM ION: 1.15 mmol/L (ref 1.15–1.40)
CHLORIDE: 91 mmol/L — AB (ref 101–111)
CHLORIDE: 93 mmol/L — AB (ref 101–111)
CHLORIDE: 91 mmol/L — AB (ref 101–111)
CHLORIDE: 90 mmol/L — AB (ref 101–111)
CREATININE: 0.6 mg/dL — AB (ref 0.61–1.24)
CREATININE: 0.5 mg/dL — AB (ref 0.61–1.24)
CREATININE: 0.7 mg/dL (ref 0.61–1.24)
CREATININE: 0.7 mg/dL (ref 0.61–1.24)
CREATININE: 0.7 mg/dL (ref 0.61–1.24)
CREATININE: 0.8 mg/dL (ref 0.61–1.24)
Calcium, Ion: 1.28 mmol/L (ref 1.15–1.40)
Calcium, Ion: 1.01 mmol/L — ABNORMAL LOW (ref 1.15–1.40)
Calcium, Ion: 1.19 mmol/L (ref 1.15–1.40)
Chloride: 91 mmol/L — ABNORMAL LOW (ref 101–111)
Chloride: 91 mmol/L — ABNORMAL LOW (ref 101–111)
Chloride: 93 mmol/L — ABNORMAL LOW (ref 101–111)
Creatinine, Ser: 0.7 mg/dL (ref 0.61–1.24)
GLUCOSE: 126 mg/dL — AB (ref 65–99)
GLUCOSE: 142 mg/dL — AB (ref 65–99)
GLUCOSE: 165 mg/dL — AB (ref 65–99)
GLUCOSE: 146 mg/dL — AB (ref 65–99)
Glucose, Bld: 153 mg/dL — ABNORMAL HIGH (ref 65–99)
Glucose, Bld: 144 mg/dL — ABNORMAL HIGH (ref 65–99)
Glucose, Bld: 124 mg/dL — ABNORMAL HIGH (ref 65–99)
HCT: 35 % — ABNORMAL LOW (ref 39.0–52.0)
HCT: 31 % — ABNORMAL LOW (ref 39.0–52.0)
HCT: 28 % — ABNORMAL LOW (ref 39.0–52.0)
HEMATOCRIT: 41 % (ref 39.0–52.0)
HEMATOCRIT: 31 % — AB (ref 39.0–52.0)
HEMATOCRIT: 29 % — AB (ref 39.0–52.0)
HEMATOCRIT: 37 % — AB (ref 39.0–52.0)
HEMOGLOBIN: 10.5 g/dL — AB (ref 13.0–17.0)
HEMOGLOBIN: 9.9 g/dL — AB (ref 13.0–17.0)
HEMOGLOBIN: 10.5 g/dL — AB (ref 13.0–17.0)
HEMOGLOBIN: 9.5 g/dL — AB (ref 13.0–17.0)
Hemoglobin: 13.9 g/dL (ref 13.0–17.0)
Hemoglobin: 11.9 g/dL — ABNORMAL LOW (ref 13.0–17.0)
Hemoglobin: 12.6 g/dL — ABNORMAL LOW (ref 13.0–17.0)
POTASSIUM: 4.4 mmol/L (ref 3.5–5.1)
POTASSIUM: 4.7 mmol/L (ref 3.5–5.1)
POTASSIUM: 4.3 mmol/L (ref 3.5–5.1)
Potassium: 5 mmol/L (ref 3.5–5.1)
Potassium: 4.7 mmol/L (ref 3.5–5.1)
Potassium: 5.3 mmol/L — ABNORMAL HIGH (ref 3.5–5.1)
Potassium: 3.8 mmol/L (ref 3.5–5.1)
SODIUM: 130 mmol/L — AB (ref 135–145)
SODIUM: 133 mmol/L — AB (ref 135–145)
Sodium: 129 mmol/L — ABNORMAL LOW (ref 135–145)
Sodium: 132 mmol/L — ABNORMAL LOW (ref 135–145)
Sodium: 128 mmol/L — ABNORMAL LOW (ref 135–145)
Sodium: 129 mmol/L — ABNORMAL LOW (ref 135–145)
Sodium: 130 mmol/L — ABNORMAL LOW (ref 135–145)
TCO2: 32 mmol/L (ref 0–100)
TCO2: 29 mmol/L (ref 0–100)
TCO2: 35 mmol/L (ref 0–100)
TCO2: 30 mmol/L (ref 0–100)
TCO2: 29 mmol/L (ref 0–100)
TCO2: 30 mmol/L (ref 0–100)
TCO2: 27 mmol/L (ref 0–100)

## 2017-03-23 LAB — CBC
HEMATOCRIT: 36.7 % — AB (ref 39.0–52.0)
HEMATOCRIT: 36.1 % — AB (ref 39.0–52.0)
HEMOGLOBIN: 12.4 g/dL — AB (ref 13.0–17.0)
Hemoglobin: 12.7 g/dL — ABNORMAL LOW (ref 13.0–17.0)
MCH: 31.4 pg (ref 26.0–34.0)
MCH: 31.3 pg (ref 26.0–34.0)
MCHC: 34.6 g/dL (ref 30.0–36.0)
MCHC: 34.3 g/dL (ref 30.0–36.0)
MCV: 90.6 fL (ref 78.0–100.0)
MCV: 91.2 fL (ref 78.0–100.0)
PLATELETS: 160 10*3/uL (ref 150–400)
Platelets: 145 10*3/uL — ABNORMAL LOW (ref 150–400)
RBC: 4.05 MIL/uL — ABNORMAL LOW (ref 4.22–5.81)
RBC: 3.96 MIL/uL — ABNORMAL LOW (ref 4.22–5.81)
RDW: 11.9 % (ref 11.5–15.5)
RDW: 12 % (ref 11.5–15.5)
WBC: 12.5 10*3/uL — ABNORMAL HIGH (ref 4.0–10.5)
WBC: 13.1 10*3/uL — ABNORMAL HIGH (ref 4.0–10.5)

## 2017-03-23 LAB — GLUCOSE, CAPILLARY
GLUCOSE-CAPILLARY: 137 mg/dL — AB (ref 65–99)
GLUCOSE-CAPILLARY: 145 mg/dL — AB (ref 65–99)
GLUCOSE-CAPILLARY: 138 mg/dL — AB (ref 65–99)
GLUCOSE-CAPILLARY: 202 mg/dL — AB (ref 65–99)
GLUCOSE-CAPILLARY: 157 mg/dL — AB (ref 65–99)
GLUCOSE-CAPILLARY: 213 mg/dL — AB (ref 65–99)
Glucose-Capillary: 113 mg/dL — ABNORMAL HIGH (ref 65–99)
Glucose-Capillary: 115 mg/dL — ABNORMAL HIGH (ref 65–99)
Glucose-Capillary: 118 mg/dL — ABNORMAL HIGH (ref 65–99)
Glucose-Capillary: 131 mg/dL — ABNORMAL HIGH (ref 65–99)
Glucose-Capillary: 137 mg/dL — ABNORMAL HIGH (ref 65–99)
Glucose-Capillary: 141 mg/dL — ABNORMAL HIGH (ref 65–99)
Glucose-Capillary: 151 mg/dL — ABNORMAL HIGH (ref 65–99)
Glucose-Capillary: 153 mg/dL — ABNORMAL HIGH (ref 65–99)
Glucose-Capillary: 162 mg/dL — ABNORMAL HIGH (ref 65–99)
Glucose-Capillary: 167 mg/dL — ABNORMAL HIGH (ref 65–99)

## 2017-03-23 LAB — POCT I-STAT 3, ART BLOOD GAS (G3+)
ACID-BASE EXCESS: 5 mmol/L — AB (ref 0.0–2.0)
ACID-BASE EXCESS: 5 mmol/L — AB (ref 0.0–2.0)
Acid-Base Excess: 5 mmol/L — ABNORMAL HIGH (ref 0.0–2.0)
BICARBONATE: 33 mmol/L — AB (ref 20.0–28.0)
BICARBONATE: 31.4 mmol/L — AB (ref 20.0–28.0)
Bicarbonate: 31.8 mmol/L — ABNORMAL HIGH (ref 20.0–28.0)
O2 SAT: 100 %
O2 SAT: 100 %
O2 Saturation: 100 %
PCO2 ART: 64.4 mmHg — AB (ref 32.0–48.0)
PCO2 ART: 57.4 mmHg — AB (ref 32.0–48.0)
PH ART: 7.352 (ref 7.350–7.450)
PO2 ART: 445 mmHg — AB (ref 83.0–108.0)
PO2 ART: 445 mmHg — AB (ref 83.0–108.0)
TCO2: 35 mmol/L (ref 0–100)
TCO2: 33 mmol/L (ref 0–100)
TCO2: 34 mmol/L (ref 0–100)
pCO2 arterial: 52.8 mmHg — ABNORMAL HIGH (ref 32.0–48.0)
pH, Arterial: 7.318 — ABNORMAL LOW (ref 7.350–7.450)
pH, Arterial: 7.383 (ref 7.350–7.450)
pO2, Arterial: 299 mmHg — ABNORMAL HIGH (ref 83.0–108.0)

## 2017-03-23 LAB — MAGNESIUM
Magnesium: 2.3 mg/dL (ref 1.7–2.4)
Magnesium: 2.2 mg/dL (ref 1.7–2.4)

## 2017-03-23 LAB — CREATININE, SERUM
CREATININE: 0.72 mg/dL (ref 0.61–1.24)
GFR calc Af Amer: >60 mL/min (ref >60)
GFR calc non Af Amer: >60 mL/min (ref >60)

## 2017-03-23 MED ORDER — DIPHENHYDRAMINE HCL 25 MG PO CAPS
50.0000 mg | ORAL_CAPSULE | Freq: Every evening | ORAL | Status: DC | PRN
  Administered 2017-03-23 – 2017-03-26 (×4): 50 mg via ORAL
  Filled 2017-03-23 (×4): qty 2

## 2017-03-23 MED ORDER — DOPAMINE-DEXTROSE 3.2-5 MG/ML-% IV SOLN
3.0000 ug/kg/min | INTRAVENOUS | Status: DC
  Administered 2017-03-23: 3 ug/kg/min via INTRAVENOUS

## 2017-03-23 MED ORDER — LACTATED RINGERS IV SOLN: INTRAVENOUS | Status: DC

## 2017-03-23 MED ORDER — ENOXAPARIN SODIUM 40 MG/0.4ML ~~LOC~~ SOLN: 40.0000 mg | Freq: Every day | SUBCUTANEOUS | Status: DC

## 2017-03-23 MED ORDER — INSULIN DETEMIR 100 UNIT/ML ~~LOC~~ SOLN
30.0000 [IU] | Freq: Every day | SUBCUTANEOUS | Status: DC
  Administered 2017-03-24 – 2017-03-25 (×2): 30 [IU] via SUBCUTANEOUS
  Filled 2017-03-23 (×3): qty 0.3

## 2017-03-23 MED ORDER — INSULIN DETEMIR 100 UNIT/ML ~~LOC~~ SOLN
30.0000 [IU] | Freq: Once | SUBCUTANEOUS | Status: AC
  Administered 2017-03-23: 30 [IU] via SUBCUTANEOUS
  Filled 2017-03-23: qty 0.3

## 2017-03-23 MED ORDER — ENOXAPARIN SODIUM 40 MG/0.4ML ~~LOC~~ SOLN
40.0000 mg | Freq: Every day | SUBCUTANEOUS | Status: DC
  Administered 2017-03-23 – 2017-03-26 (×4): 40 mg via SUBCUTANEOUS
  Filled 2017-03-23 (×4): qty 0.4

## 2017-03-23 MED ORDER — INSULIN ASPART 100 UNIT/ML ~~LOC~~ SOLN
0.0000 [IU] | SUBCUTANEOUS | Status: DC
  Administered 2017-03-23: 8 [IU] via SUBCUTANEOUS
  Administered 2017-03-24 (×3): 2 [IU] via SUBCUTANEOUS

## 2017-03-23 MED ORDER — DOPAMINE-DEXTROSE 3.2-5 MG/ML-% IV SOLN
INTRAVENOUS | Status: AC
  Filled 2017-03-23: qty 250

## 2017-03-23 MED FILL — Heparin Sodium (Porcine) Inj 1000 Unit/ML: INTRAMUSCULAR | Qty: 30 | Status: AC

## 2017-03-23 MED FILL — Mannitol IV Soln 20%: INTRAVENOUS | Qty: 500 | Status: AC

## 2017-03-23 MED FILL — Electrolyte-R (PH 7.4) Solution: INTRAVENOUS | Qty: 4000 | Status: AC

## 2017-03-23 MED FILL — Lidocaine HCl IV Inj 20 MG/ML: INTRAVENOUS | Qty: 5 | Status: AC

## 2017-03-23 MED FILL — Sodium Bicarbonate IV Soln 8.4%: INTRAVENOUS | Qty: 100 | Status: AC

## 2017-03-23 MED FILL — Sodium Chloride IV Soln 0.9%: INTRAVENOUS | Qty: 2000 | Status: AC

## 2017-03-23 NOTE — Op Note (Signed)
NAME:  David Frost, David Frost NO.:  MEDICAL RECORD NO.:  [id number]  LOCATION:                                 FACILITY:  PHYSICIAN:  Lanelle Bal, MD         DATE OF BIRTH:  DATE OF PROCEDURE:  03/22/2017 DATE OF DISCHARGE:                              OPERATIVE REPORT   PREOPERATIVE DIAGNOSIS:  Recent non-ST-elevation myocardial infarction with 3-vessel coronary artery disease.  POSTOPERATIVE DIAGNOSIS:  Recent non-ST-elevation myocardial infarction with 3-vessel coronary artery disease.  SURGICAL PROCEDURE:  Coronary artery bypass grafting x6 with the left internal mammary to the left anterior descending coronary artery, reverse saphenous vein graft to the intermediate coronary, sequential reverse saphenous vein graft to the first obtuse marginal and distal circumflex, sequential reverse saphenous vein graft to the posterior descending and posterolateral branches of the right coronary artery with endoscopic vein harvesting of the right greater saphenous vein, thigh and calf, and left greater saphenous vein, thigh.  SURGEON:  Lanelle Bal, MD  FIRST ASSISTANT:  Ellwood Handler, PA.  BRIEF HISTORY:  The patient is a 66 year old diabetic male, who presented in [place] with prolonged episodes of chest pain while working on his boat.  Troponins were elevated.  He was transferred between several hospitals and ultimately underwent cardiac catheterization and returned as an inpatient transfer to [facility] for consideration of heart surgery.  At the time of cardiac catheterization, he was found to have preserved LV function with severe 3-vessel coronary artery disease with mid 70% right, greater than 80% distal right to posterior lateral branch, total occlusion of the distal circumflex, proximal segment 80% stenosis of the circumflex, 70% to 80% stenosis of the intermediate, and 60% to 70% stenosis of the LAD.  The patient is 299 pounds with a body surface  area of 2.6.  Risks and options were discussed with him in detail and recommended coronary artery bypass grafting for multivessel disease in a diabetic patient.  The patient agreed and signed informed consent.  DESCRIPTION OF PROCEDURE:  With Swan-Ganz and arterial line monitors in place, the patient underwent general endotracheal anesthesia without incident.  Skin of the chest and legs was prepped with Betadine and draped in the usual sterile manner.  Appropriate time-out was performed and we then proceeded with harvesting the right greater saphenous vein from the thigh and upper calf endoscopically.  Additional vein was harvested endoscopically from the left thigh.  A median sternotomy was performed.  The left internal mammary artery was dissected down as a pedicle graft.  The distal artery was divided and had good free flow. Pericardium was opened.  Overall, ventricular function appeared preserved.  This was confirmed on TEE placed by Dr. Tobias Alexander.  The patient was systemically heparinized.  The ascending aorta was cannulated with a 24-French arterial cannula.  Due to the patient's large body surface area, a dual-stage venous cannula was placed.  An aortic root vent cardioplegia needle was placed in the ascending aorta.  The patient was placed on cardiopulmonary bypass 2.4 L/min sq m.  Sites for anastomosis were selected and dissected out of the epicardium.  The patient's body temperature was  cooled to 32 degrees.  Aortic crossclamp was applied. 800 cc of cold blood potassium cardioplegia was administered with diastolic arrest of the heart.  Myocardial septal temperature was monitored with crossclamp.  Attention was turned first to the right coronary artery.  There was a high takeoff of the posterior descending vessel, which was opened, was moderate size, admitting a 1.5 mm probe. Using a diamond type, side-to-side anastomosis was carried out with a running 7-0 Prolene.  Distal extent  of the same vein was then carried to the distal posterolateral branch.  This vessel was much smaller, 1.2 mm in size.  Using a running 7-0 Prolene, distal anastomosis was performed. Additional cold blood cardioplegia was administered down the vein grafts intermittently.  The heart was then elevated.  The obtuse marginal vessel was opened and was 1.5 mm in size.  Using a diamond type, side-to- side anastomosis was carried out with a running 7-0 Prolene.  A segment of reverse saphenous vein graft was anastomosed to the distal extent of the same vein.  It was then carried to the circumflex branch, which was totally occluded.  This vessel was of similar size or slightly smaller than the obtuse marginal.  The vessel was opened and admitted 1.5 mm probe.  The distal anastomosis was performed with a running 7-0 Prolene. The diagonal coronary artery was a very small vessel and not thought big enough to bypass.  The intermediate was of moderate size and partially intramyocardial.  Intermediate vessel was opened and passed a 1.5 mm probe distally.  Using a running 7-0 Prolene, a segment of reverse saphenous vein graft was anastomosed to the intermediate.  In the mid to distal portion of the left anterior descending coronary artery, the vessel was opened, admitted a 1.5 mm probe distally.  Using a running 8- 0 Prolene, the left internal mammary artery was anastomosed to the left anterior descending coronary artery.  With the crossclamp still in place, 3 punch aortotomies were performed and each of the 3 vein grafts were anastomosed to the ascending aorta.  The heart was allowed to passively fill and de-air.  The Bulldog was removed from the mammary artery with rise in myocardial septal temperature.  The aortic crossclamp was removed with total crossclamp time 124 minutes.  The patient required electric defibrillation to return to a sinus rhythm. He was then atrially paced to increase rate.  Sites of  anastomosis were inspected and were free of bleeding.  With the patient's body rewarmed to 37 degrees, he was then ventilated and weaned from cardiopulmonary bypass without difficulty.  He remained hemodynamically stable.  He was decannulated in the usual fashion.  Protamine sulfate was administered. With operative field hemostatic and atrial and ventricular pacing wires attached, graft markers were applied.  The pericardium was loosely reapproximated.  A left pleural tube and a Blake mediastinal drain were left in place.  The sternum was closed with #6 stainless steel wire. Fascia was closed with interrupted 0 Vicryl, running 3-0 Vicryl in subcutaneous tissue, 4-0 subcuticular stitch in the skin edges.  Dry dressings were applied.  Sponge and needle count was reported as correct at completion of the procedure.  The patient tolerated the procedure without obvious complication and was transferred to Surgical Intensive Care Unit for further postoperative care.  Total pump time was 173 minutes.  The patient did not require any blood bank blood products during the operative procedure.     Lanelle Bal, MD     EG/MEDQ  D:  03/23/2017  T:  03/23/2017  Job:  [id number]

## 2017-03-23 NOTE — Plan of Care (Signed)
Noticed right shin with weeping area. Patient states that place has been there for years but has not been draining. Dressing applied

## 2017-03-23 NOTE — Progress Notes (Signed)
Patient ID: David Frost, male   DOB: 1951-04-16, 66 y.o.   MRN: [id number] TCTS DAILY ICU PROGRESS NOTE                   [address]            [place] [address]          [phone]   1 Day Post-Op Procedure(s) (LRB): CORONARY ARTERY BYPASS GRAFTING (CABG) x6, using the left internal mammary artery and the greater saphenous vein harvested endoscopically from the right thigh and calf and the left thigh. -LIMA to LAD -SVG to RAMUS INTERMEDIATE -SEQ SVG to OM1 and DISTAL LEFT CIRCUMFLEX -SEQ SVG to PDA and PLVB (N/A) TRANSESOPHAGEAL ECHOCARDIOGRAM (TEE) (N/A)  Total Length of Stay:  LOS: 6 days   Subjective: Awake and alert , neuro intact  Objective: Vital signs in last 24 hours: Temp:  [97.2 F (36.2 C)-100.6 F (38.1 C)] 97.3 F (36.3 C) (07/03 0715) Pulse Rate:  [79-90] 80 (07/03 0715) Cardiac Rhythm: Atrial paced (07/03 0500) Resp:  [10-24] 12 (07/03 0715) BP: (90-126)/(54-86) 111/75 (07/03 0715) SpO2:  [96 %-100 %] 100 % (07/03 0715) Arterial Line BP: (86-142)/(41-78) 111/51 (07/03 0715) FiO2 (%):  [40 %-50 %] 40 % (07/02 2027) Weight:  [313 lb (142 kg)] 313 lb (142 kg) (07/03 0500)  Filed Weights   03/21/17 0455 03/22/17 0442 03/23/17 0500  Weight: (!) 300 lb 11.2 oz (136.4 kg) 299 lb 11.2 oz (135.9 kg) (!) 313 lb (142 kg)    Weight change: 13 lb 4.8 oz (6.033 kg)   Hemodynamic parameters for last 24 hours: PAP: (21-53)/(3-37) 23/15 CO:  [5.2 L/min-7.5 L/min] 6.4 L/min CI:  [2 L/min/m2-2.9 L/min/m2] 2.5 L/min/m2  Intake/Output from previous day: 07/02 0701 - 07/03 0700 In: 8168.4 [P.O.:960; I.V.:5608.4; Blood:700; IV Piggyback:900] Out: 3670 [Urine:3100; Emesis/NG output:100; Chest Tube:470]  Intake/Output this shift: No intake/output data recorded.  Current Meds: Scheduled Meds: . acetaminophen  1,000 mg Oral Q6H   Or  . acetaminophen (TYLENOL) oral liquid 160 mg/5 mL  1,000 mg Per Tube Q6H  . aspirin EC  325 mg Oral Daily   Or  .  aspirin  324 mg Per Tube Daily  . atorvastatin  80 mg Oral q1800  . bisacodyl  10 mg Oral Daily   Or  . bisacodyl  10 mg Rectal Daily  . brimonidine  1 drop Both Eyes BID   And  . timolol  1 drop Both Eyes BID  . chlorhexidine gluconate (MEDLINE KIT)  15 mL Mouth Rinse BID  . Chlorhexidine Gluconate Cloth  6 each Topical Daily  . docusate sodium  200 mg Oral Daily  . folic acid  1 mg Oral Daily  . irbesartan  300 mg Oral Daily   And  . hydrochlorothiazide  25 mg Oral Daily  . insulin regular  0-10 Units Intravenous TID WC  . mouth rinse  15 mL Mouth Rinse QID  . metoCLOPramide (REGLAN) injection  10 mg Intravenous Q6H  . metoprolol tartrate  12.5 mg Oral BID   Or  . metoprolol tartrate  12.5 mg Per Tube BID  . multivitamin with minerals  1 tablet Oral Daily  . mupirocin ointment  1 application Nasal BID  . [START ON 03/24/2017] pantoprazole  40 mg Oral Daily  . sodium chloride flush  10-40 mL Intracatheter Q12H  . sodium chloride flush  3 mL Intravenous Q12H  . sodium chloride flush  3 mL  Intravenous Q12H  . thiamine  100 mg Oral Daily   Continuous Infusions: . sodium chloride 20 mL/hr at 03/23/17 0700  . sodium chloride    . sodium chloride    . sodium chloride    . albumin human    . cefUROXime (ZINACEF)  IV Stopped (03/23/17 0450)  . dexmedetomidine (PRECEDEX) IV infusion Stopped (03/22/17 1859)  . famotidine (PEPCID) IV Stopped (03/22/17 1657)  . insulin (NOVOLIN-R) infusion 1.5 Units/hr (03/23/17 0600)  . lactated ringers 500 mL (03/22/17 1900)  . lactated ringers 20 mL/hr at 03/22/17 2300  . lactated ringers    . nitroGLYCERIN    . phenylephrine (NEO-SYNEPHRINE) Adult infusion Stopped (03/23/17 0700)   PRN Meds:.sodium chloride, sodium chloride, acetaminophen, albumin human, lactated ringers, metoprolol tartrate, midazolam, morphine injection, nitroGLYCERIN, ondansetron (ZOFRAN) IV, ondansetron (ZOFRAN) IV, oxyCODONE, sodium chloride flush, sodium chloride flush,  sodium chloride flush, traMADol  General appearance: alert and cooperative Neurologic: intact Heart: regular rate and rhythm, S1, S2 normal, no murmur, click, rub or gallop Lungs: diminished breath sounds bibasilar Abdomen: soft, non-tender; bowel sounds normal; no masses,  no organomegaly Extremities: extremities normal, atraumatic, no cyanosis or edema and Homans sign is negative, no sign of DVT Wound: sternum stable   Lab Results: CBC: Recent Labs  03/22/17 2250 03/22/17 2256 03/23/17 0404  WBC 14.0*  --  12.5*  HGB 14.2 13.6 12.7*  HCT 40.1 40.0 36.7*  PLT 174  --  160   BMET:  Recent Labs  03/22/17 0508  03/22/17 2256 03/23/17 0404  NA 130*  < > 132* 130*  K 4.0  < > 5.6* 4.2  CL 90*  --  94* 98*  CO2 31  --   --  27  GLUCOSE 136*  < > 142* 117*  BUN 15  --  22* 14  CREATININE 0.94  < > 0.70 0.69  CALCIUM 9.5  --   --  8.0*  < > = values in this interval not displayed.  CMET: Lab Results  Component Value Date   WBC 12.5 (H) 03/23/2017   HGB 12.7 (L) 03/23/2017   HCT 36.7 (L) 03/23/2017   PLT 160 03/23/2017   GLUCOSE 117 (H) 03/23/2017   CHOL 190 03/18/2017   TRIG 331 (H) 03/18/2017   HDL 29 (L) 03/18/2017   LDLCALC 95 03/18/2017   ALT 118 (H) 03/21/2017   AST 48 (H) 03/21/2017   NA 130 (L) 03/23/2017   K 4.2 03/23/2017   CL 98 (L) 03/23/2017   CREATININE 0.69 03/23/2017   BUN 14 03/23/2017   CO2 27 03/23/2017   INR 1.28 03/22/2017   HGBA1C 6.0 (H) 03/18/2017      PT/INR:  Recent Labs  03/22/17 1540  LABPROT 16.1*  INR 1.28   Radiology: Dg Chest Port 1 View  Result Date: 03/22/2017 CLINICAL DATA:  Status post CABG EXAM: PORTABLE CHEST 1 VIEW COMPARISON:  Chest x-ray of March 18, 2017 FINDINGS: The lungs are adequately inflated. There is no pneumothorax nor large pleural effusion. The cardiac silhouette is enlarged. The pulmonary vascularity is mildly prominent and the interstitial markings are mildly increased. The Swan-Ganz catheter tip  projects in the proximal main pulmonary artery. The left chest tube tip projects over the lateral aspect of the 6 rib. A mediastinal drain is present and projects at approximately the T4 level. The endotracheal tube tip projects approximately 5.8 cm above the carina. The esophagogastric tube tip and proximal port project below the GE junction. IMPRESSION: Post  CABG changes. Mild pulmonary edema. No pneumothorax, significant pleural effusion, nor significant atelectasis. The support tubes are in reasonable position. Electronically Signed   By: David  Martinique M.D.   On: 03/22/2017 16:26     Assessment/Plan: S/P Procedure(s) (LRB): CORONARY ARTERY BYPASS GRAFTING (CABG) x6, using the left internal mammary artery and the greater saphenous vein harvested endoscopically from the right thigh and calf and the left thigh. -LIMA to LAD -SVG to RAMUS INTERMEDIATE -SEQ SVG to OM1 and DISTAL LEFT CIRCUMFLEX -SEQ SVG to PDA and PLVB (N/A) TRANSESOPHAGEAL ECHOCARDIOGRAM (TEE) (N/A) Mobilize Diuresis Diabetes control See progression orders Expected Acute  Blood - loss Anemia    [name] 03/23/2017 7:26 AM

## 2017-03-23 NOTE — Addendum Note (Signed)
Addendum  created 03/23/17 0850 by Fidela Juneau, CRNA   Anesthesia Intra Meds edited

## 2017-03-23 NOTE — Plan of Care (Signed)
Problem: Activity: Goal: Risk for activity intolerance will decrease Outcome: Progressing Pt has sat up in chair for the majority of day shift and thus far on night shift.  Problem: Bowel/Gastric: Goal: Gastrointestinal status for postoperative course will improve Outcome: Progressing Pt states that he is passing gas.  Problem: Skin Integrity: Goal: Risk for impaired skin integrity will decrease Outcome: Progressing Pt has on intact sacral foam.

## 2017-03-23 NOTE — Care Management Note (Signed)
Case Management Note Previous CM note initiated by [name], RN--03/19/2017, 3:37 PM    Patient Details  Name: David Frost MRN: [id number] Date of Birth: 1951-07-06  Subjective/Objective: Pt presented for S/p NSTEMI, multivessel coronary artery disease. Plan for CABG 03-22-17. Pt is from home with family support. PTA- Independent.                   Action/Plan: CM will continue to monitor for disposition needs post procedure.   Expected Discharge Date:                  Expected Discharge Plan:  [place]  In-House Referral:  NA  Discharge planning Services  CM Consult  Post Acute Care Choice:    Choice offered to:     DME Arranged:    DME Agency:     HH Arranged:    HH Agency:     Status of Service:  In process, will continue to follow  If discussed at Long Length of Stay Meetings, dates discussed:    Discharge Disposition:   Additional Comments:  03/23/17- 1130- Marvetta Gibbons RN, CM- pt s/p CABGx6 on 03/22/17- tx to [place] post op- CM to continue to follow for d/c needs  Dawayne Patricia, RN 03/23/2017, 11:37 AM [phone]

## 2017-03-23 NOTE — Progress Notes (Signed)
Patient ID: David Frost, male   DOB: 1951/02/27, 66 y.o.   MRN: [id number]  SICU Evening Rounds  Hemodynamically stable in sinus rhythm, dop 3.  Up in chair  Good urine output today.  CBC    Component Value Date/Time   WBC 13.1 (H) 03/23/2017 1551   RBC 3.96 (L) 03/23/2017 1551   HGB 12.6 (L) 03/23/2017 1557   HCT 37.0 (L) 03/23/2017 1557   PLT 145 (L) 03/23/2017 1551   MCV 91.2 03/23/2017 1551   MCH 31.3 03/23/2017 1551   MCHC 34.3 03/23/2017 1551   RDW 12.0 03/23/2017 1551   BMET    Component Value Date/Time   NA 130 (L) 03/23/2017 1557   K 4.3 03/23/2017 1557   CL 90 (L) 03/23/2017 1557   CO2 27 03/23/2017 0404   GLUCOSE 124 (H) 03/23/2017 1557   BUN 19 03/23/2017 1557   CREATININE 0.80 03/23/2017 1557   CALCIUM 8.0 (L) 03/23/2017 0404   GFRNONAA >60 03/23/2017 1551   GFRAA >60 03/23/2017 1551

## 2017-03-24 ENCOUNTER — Inpatient Hospital Stay (HOSPITAL_COMMUNITY): Payer: Medicare Other

## 2017-03-24 LAB — COMPREHENSIVE METABOLIC PANEL
ALT: 64 U/L — ABNORMAL HIGH (ref 17–63)
AST: 40 U/L (ref 15–41)
Albumin: 3.1 g/dL — ABNORMAL LOW (ref 3.5–5.0)
Alkaline Phosphatase: 47 U/L (ref 38–126)
Anion gap: 6 (ref 5–15)
BUN: 20 mg/dL (ref 6–20)
CO2: 28 mmol/L (ref 22–32)
Calcium: 8.3 mg/dL — ABNORMAL LOW (ref 8.9–10.3)
Chloride: 94 mmol/L — ABNORMAL LOW (ref 101–111)
Creatinine, Ser: 0.75 mg/dL (ref 0.61–1.24)
GFR calc Af Amer: >60 mL/min (ref >60)
GFR calc non Af Amer: >60 mL/min (ref >60)
Glucose, Bld: 125 mg/dL — ABNORMAL HIGH (ref 65–99)
Potassium: 4 mmol/L (ref 3.5–5.1)
Sodium: 128 mmol/L — ABNORMAL LOW (ref 135–145)
Total Bilirubin: 0.8 mg/dL (ref 0.3–1.2)
Total Protein: 5.5 g/dL — ABNORMAL LOW (ref 6.5–8.1)

## 2017-03-24 LAB — GLUCOSE, CAPILLARY
GLUCOSE-CAPILLARY: 122 mg/dL — AB (ref 65–99)
GLUCOSE-CAPILLARY: 129 mg/dL — AB (ref 65–99)
Glucose-Capillary: 122 mg/dL — ABNORMAL HIGH (ref 65–99)
Glucose-Capillary: 134 mg/dL — ABNORMAL HIGH (ref 65–99)
Glucose-Capillary: 127 mg/dL — ABNORMAL HIGH (ref 65–99)

## 2017-03-24 LAB — CBC
HEMATOCRIT: 34.4 % — AB (ref 39.0–52.0)
HEMOGLOBIN: 11.6 g/dL — AB (ref 13.0–17.0)
MCH: 30.7 pg (ref 26.0–34.0)
MCHC: 33.7 g/dL (ref 30.0–36.0)
MCV: 91 fL (ref 78.0–100.0)
Platelets: 133 10*3/uL — ABNORMAL LOW (ref 150–400)
RBC: 3.78 MIL/uL — AB (ref 4.22–5.81)
RDW: 12 % (ref 11.5–15.5)
WBC: 11.7 10*3/uL — AB (ref 4.0–10.5)

## 2017-03-24 MED ORDER — DOCUSATE SODIUM 100 MG PO CAPS
200.0000 mg | ORAL_CAPSULE | Freq: Every day | ORAL | Status: DC
  Administered 2017-03-25 – 2017-03-26 (×2): 200 mg via ORAL
  Filled 2017-03-24 (×2): qty 2

## 2017-03-24 MED ORDER — TRAMADOL HCL 50 MG PO TABS
50.0000 mg | ORAL_TABLET | ORAL | Status: DC | PRN
  Administered 2017-03-24 – 2017-03-25 (×6): 100 mg via ORAL
  Filled 2017-03-24 (×6): qty 2

## 2017-03-24 MED ORDER — PHENOL 1.4 % MT LIQD
1.0000 | OROMUCOSAL | Status: DC | PRN
  Filled 2017-03-24: qty 177

## 2017-03-24 MED ORDER — SODIUM CHLORIDE 0.9% FLUSH: 3.0000 mL | INTRAVENOUS | Status: DC | PRN

## 2017-03-24 MED ORDER — ACETAMINOPHEN 325 MG PO TABS: 650.0000 mg | ORAL_TABLET | Freq: Four times a day (QID) | ORAL | Status: DC | PRN

## 2017-03-24 MED ORDER — BISACODYL 5 MG PO TBEC: 10.0000 mg | DELAYED_RELEASE_TABLET | Freq: Every day | ORAL | Status: DC | PRN

## 2017-03-24 MED ORDER — ALPRAZOLAM 0.25 MG PO TABS
0.2500 mg | ORAL_TABLET | Freq: Two times a day (BID) | ORAL | Status: DC | PRN
  Administered 2017-03-24 – 2017-03-26 (×5): 0.25 mg via ORAL
  Filled 2017-03-24 (×5): qty 1

## 2017-03-24 MED ORDER — MOVING RIGHT ALONG BOOK
Freq: Once | Status: AC
  Filled 2017-03-24: qty 1

## 2017-03-24 MED ORDER — ONDANSETRON HCL 4 MG/2ML IJ SOLN: 4.0000 mg | Freq: Four times a day (QID) | INTRAMUSCULAR | Status: DC | PRN

## 2017-03-24 MED ORDER — SODIUM CHLORIDE 0.9% FLUSH
3.0000 mL | Freq: Two times a day (BID) | INTRAVENOUS | Status: DC
  Administered 2017-03-24 – 2017-03-26 (×5): 3 mL via INTRAVENOUS

## 2017-03-24 MED ORDER — OXYCODONE HCL 5 MG PO TABS: 5.0000 mg | ORAL_TABLET | ORAL | Status: DC | PRN

## 2017-03-24 MED ORDER — SODIUM CHLORIDE 0.9 % IV SOLN: 250.0000 mL | INTRAVENOUS | Status: DC | PRN

## 2017-03-24 MED ORDER — PANTOPRAZOLE SODIUM 40 MG PO TBEC
40.0000 mg | DELAYED_RELEASE_TABLET | Freq: Every day | ORAL | Status: DC
  Administered 2017-03-25 – 2017-03-26 (×2): 40 mg via ORAL
  Filled 2017-03-24 (×2): qty 1

## 2017-03-24 MED ORDER — BISACODYL 10 MG RE SUPP
10.0000 mg | Freq: Every day | RECTAL | Status: DC | PRN
  Filled 2017-03-24: qty 1

## 2017-03-24 MED ORDER — FUROSEMIDE 40 MG PO TABS
40.0000 mg | ORAL_TABLET | Freq: Every day | ORAL | Status: DC
  Administered 2017-03-24 – 2017-03-25 (×2): 40 mg via ORAL
  Filled 2017-03-24 (×3): qty 1

## 2017-03-24 MED ORDER — METOPROLOL TARTRATE 12.5 MG HALF TABLET
12.5000 mg | ORAL_TABLET | Freq: Two times a day (BID) | ORAL | Status: DC
  Administered 2017-03-24 – 2017-03-26 (×6): 12.5 mg via ORAL
  Filled 2017-03-24 (×5): qty 1

## 2017-03-24 MED ORDER — INSULIN ASPART 100 UNIT/ML ~~LOC~~ SOLN
0.0000 [IU] | Freq: Three times a day (TID) | SUBCUTANEOUS | Status: DC
  Administered 2017-03-24 – 2017-03-26 (×7): 2 [IU] via SUBCUTANEOUS

## 2017-03-24 MED ORDER — ONDANSETRON HCL 4 MG PO TABS: 4.0000 mg | ORAL_TABLET | Freq: Four times a day (QID) | ORAL | Status: DC | PRN

## 2017-03-24 MED ORDER — ASPIRIN EC 325 MG PO TBEC
325.0000 mg | DELAYED_RELEASE_TABLET | Freq: Every day | ORAL | Status: DC
  Administered 2017-03-24 – 2017-03-26 (×3): 325 mg via ORAL
  Filled 2017-03-24 (×2): qty 1

## 2017-03-24 NOTE — Progress Notes (Signed)
Progress Note  Patient Name: David Frost Date of Encounter: 03/24/2017  Primary Cardiologist: Gwenlyn Found  Subjective   POD # 2 CABG X 6. Looks great!!!. C/O chest wall pain. VSS. On low dose dop  Inpatient Medications    Scheduled Meds: . acetaminophen  1,000 mg Oral Q6H   Or  . acetaminophen (TYLENOL) oral liquid 160 mg/5 mL  1,000 mg Per Tube Q6H  . aspirin EC  325 mg Oral Daily   Or  . aspirin  324 mg Per Tube Daily  . atorvastatin  80 mg Oral q1800  . bisacodyl  10 mg Oral Daily   Or  . bisacodyl  10 mg Rectal Daily  . brimonidine  1 drop Both Eyes BID   And  . timolol  1 drop Both Eyes BID  . Chlorhexidine Gluconate Cloth  6 each Topical Daily  . docusate sodium  200 mg Oral Daily  . enoxaparin (LOVENOX) injection  40 mg Subcutaneous QHS  . folic acid  1 mg Oral Daily  . irbesartan  300 mg Oral Daily   And  . hydrochlorothiazide  25 mg Oral Daily  . insulin aspart  0-24 Units Subcutaneous Q4H  . insulin detemir  30 Units Subcutaneous Daily  . metoprolol tartrate  12.5 mg Oral BID   Or  . metoprolol tartrate  12.5 mg Per Tube BID  . multivitamin with minerals  1 tablet Oral Daily  . mupirocin ointment  1 application Nasal BID  . pantoprazole  40 mg Oral Daily  . sodium chloride flush  10-40 mL Intracatheter Q12H  . sodium chloride flush  3 mL Intravenous Q12H  . sodium chloride flush  3 mL Intravenous Q12H  . thiamine  100 mg Oral Daily   Continuous Infusions: . sodium chloride Stopped (03/23/17 1005)  . sodium chloride    . sodium chloride    . sodium chloride    . dexmedetomidine (PRECEDEX) IV infusion Stopped (03/22/17 1859)  . DOPamine 3 mcg/kg/min (03/24/17 0600)  . insulin (NOVOLIN-R) infusion Stopped (03/23/17 1420)  . lactated ringers 500 mL (03/22/17 1900)  . lactated ringers 20 mL/hr at 03/22/17 2300  . lactated ringers    . lactated ringers 20 mL/hr at 03/24/17 0600  . nitroGLYCERIN    . phenylephrine (NEO-SYNEPHRINE) Adult infusion Stopped  (03/23/17 0700)   PRN Meds: sodium chloride, sodium chloride, acetaminophen, diphenhydrAMINE, lactated ringers, metoprolol tartrate, midazolam, morphine injection, nitroGLYCERIN, ondansetron (ZOFRAN) IV, oxyCODONE, sodium chloride flush, sodium chloride flush, sodium chloride flush, traMADol   Vital Signs    Vitals:   03/24/17 0401 03/24/17 0457 03/24/17 0500 03/24/17 0600  BP:   130/75 (!) 109/48  Pulse:   84 85  Resp:   20 15  Temp: 99.1 F (37.3 C)     TempSrc: Oral     SpO2:   94% 96%  Weight:  (!) 316 lb 2.2 oz (143.4 kg)    Height:        Intake/Output Summary (Last 24 hours) at 03/24/17 0731 Last data filed at 03/24/17 0600  Gross per 24 hour  Intake          2586.45 ml  Output             1595 ml  Net           991.45 ml   Filed Weights   03/22/17 0442 03/23/17 0500 03/24/17 0457  Weight: 299 lb 11.2 oz (135.9 kg) (!) 313 lb (142 kg) Marland Kitchen)  316 lb 2.2 oz (143.4 kg)    Telemetry    NSR, BBB - Personally Reviewed  ECG    NSR 72 RBBB (7/3) - Personally Reviewed  Physical Exam   GEN: No acute distress.   Neck: No JVD Cardiac: RRR, no murmurs, rubs, or gallops.  Respiratory: Clear to auscultation bilaterally. GI: Soft, nontender, non-distended  MS: No edema; No deformity. Neuro:  Nonfocal  Psych: Normal affect   Labs    Chemistry Recent Labs Lab 03/21/17 0305 03/22/17 0508  03/23/17 0404 03/23/17 1551 03/23/17 1557 03/24/17 0259  NA 130* 130*  < > 130*  --  130* 128*  K 3.5 4.0  < > 4.2  --  4.3 4.0  CL 92* 90*  < > 98*  --  90* 94*  CO2 29 31  --  27  --   --  28  GLUCOSE 129* 136*  < > 117*  --  124* 125*  BUN 16 15  < > 14  --  19 20  CREATININE 0.87 0.94  < > 0.69 0.72 0.80 0.75  CALCIUM 9.1 9.5  --  8.0*  --   --  8.3*  PROT 6.8  --   --   --   --   --  5.5*  ALBUMIN 3.6  --   --   --   --   --  3.1*  AST 48*  --   --   --   --   --  40  ALT 118*  --   --   --   --   --  64*  ALKPHOS 71  --   --   --   --   --  47  BILITOT 0.7  --   --    --   --   --  0.8  GFRNONAA >60 >60  < > >60 >60  --  >60  GFRAA >60 >60  < > >60 >60  --  >60  ANIONGAP 9 9  --  5  --   --  6  < > = values in this interval not displayed.   Hematology Recent Labs Lab 03/23/17 0404 03/23/17 1551 03/23/17 1557 03/24/17 0539  WBC 12.5* 13.1*  --  11.7*  RBC 4.05* 3.96*  --  3.78*  HGB 12.7* 12.4* 12.6* 11.6*  HCT 36.7* 36.1* 37.0* 34.4*  MCV 90.6 91.2  --  91.0  MCH 31.4 31.3  --  30.7  MCHC 34.6 34.3  --  33.7  RDW 11.9 12.0  --  12.0  PLT 160 145*  --  133*    Cardiac EnzymesNo results for input(s): TROPONINI in the last 168 hours. No results for input(s): TROPIPOC in the last 168 hours.   BNPNo results for input(s): BNP, PROBNP in the last 168 hours.   DDimer No results for input(s): DDIMER in the last 168 hours.   Radiology    Dg Chest Port 1 View  Result Date: 03/23/2017 CLINICAL DATA:  Recent coronary artery bypass grafting. Status post extubation. Hypertension. EXAM: PORTABLE CHEST 1 VIEW COMPARISON:  March 22, 2017. FINDINGS: Endotracheal tube and nasogastric tube have been removed. Swan-Ganz catheter tip is in the main pulmonary outflow tract. Left chest tube remains. Temporary pacemaker wires are attached to the right heart. No pneumothorax. There is atelectatic change in the bases, stable. No new opacity is evident. There is cardiomegaly. The pulmonary vascularity is within normal limits. No adenopathy. No evident bone  lesions. IMPRESSION: Tube and catheter positions as described without pneumothorax. Stable cardiomegaly. Bibasilar atelectasis, unchanged. No new opacity. Electronically Signed   By: Lowella Grip III M.D.   On: 03/23/2017 07:25   Dg Chest Port 1 View  Result Date: 03/22/2017 CLINICAL DATA:  Status post CABG EXAM: PORTABLE CHEST 1 VIEW COMPARISON:  Chest x-ray of March 18, 2017 FINDINGS: The lungs are adequately inflated. There is no pneumothorax nor large pleural effusion. The cardiac silhouette is enlarged. The  pulmonary vascularity is mildly prominent and the interstitial markings are mildly increased. The Swan-Ganz catheter tip projects in the proximal main pulmonary artery. The left chest tube tip projects over the lateral aspect of the 6 rib. A mediastinal drain is present and projects at approximately the T4 level. The endotracheal tube tip projects approximately 5.8 cm above the carina. The esophagogastric tube tip and proximal port project below the GE junction. IMPRESSION: Post CABG changes. Mild pulmonary edema. No pneumothorax, significant pleural effusion, nor significant atelectasis. The support tubes are in reasonable position. Electronically Signed   By: David  Martinique M.D.   On: 03/22/2017 16:26    Cardiac Studies   OP Note (7/3)  OPERATIVE REPORT   PREOPERATIVE DIAGNOSIS:  Recent non-ST-elevation myocardial infarction with 3-vessel coronary artery disease.  POSTOPERATIVE DIAGNOSIS:  Recent non-ST-elevation myocardial infarction with 3-vessel coronary artery disease.  SURGICAL PROCEDURE:  Coronary artery bypass grafting x6 with the left internal mammary to the left anterior descending coronary artery, reverse saphenous vein graft to the intermediate coronary, sequential reverse saphenous vein graft to the first obtuse marginal and distal circumflex, sequential reverse saphenous vein graft to the posterior descending and posterolateral branches of the right coronary artery with endoscopic vein harvesting of the right greater saphenous vein, thigh and calf, and left greater saphenous vein, thigh.  Patient Profile     66 y.o. male well known to me with h/o obesity, HTN, HLD, Tob now POD # 2 CABG X 6 for NSTEMI. [place] requested Tx to [facility] for CABG. Procedure and post op course uneventful. Nl LV fxn. VSS. NSR. On low dose dop  Assessment & Plan    1. CAD- Post op day # 2 CABG X 6 ([facility]) . Stable post op course. VSS. On low dose dop.  CT/MT still in. Up in chair. I/O + 10L. Needs gentle diuresis. Mobilize. Nl progression. Apprec TCTs's excellent care!!!  2. Essential HTN. - Not on PO meds yet. BP OK  3. HLD- On high dose statin Rx   Signed, Quay Burow, MD  03/24/2017, 7:31 AM

## 2017-03-24 NOTE — Progress Notes (Signed)
Called report to RN receiving patient on 2W. Will transfer patient with all belongings. Wife at bedside and updated.  Levon Hedger, RN

## 2017-03-24 NOTE — Progress Notes (Signed)
Patient ID: David Frost, male   DOB: 1951/03/22, 66 y.o.   MRN: [id number] EVENING ROUNDS NOTE :     [address]       [place] [address]             [phone]                 2 Days Post-Op Procedure(s) (LRB): CORONARY ARTERY BYPASS GRAFTING (CABG) x6, using the left internal mammary artery and the greater saphenous vein harvested endoscopically from the right thigh and calf and the left thigh. -LIMA to LAD -SVG to RAMUS INTERMEDIATE -SEQ SVG to OM1 and DISTAL LEFT CIRCUMFLEX -SEQ SVG to PDA and PLVB (N/A) TRANSESOPHAGEAL ECHOCARDIOGRAM (TEE) (N/A)  Total Length of Stay:  LOS: 7 days  BP 113/66   Pulse 85   Temp 99.4 F (37.4 C) (Oral)   Resp 20   Ht 6\' 2"  (1.88 m)   Wt (!) 316 lb 2.2 oz (143.4 kg)   SpO2 100%   BMI 40.59 kg/m   .Intake/Output      07/03 0701 - 07/04 0700 07/04 0701 - 07/05 0700   P.O. 1580 400   I.V. (mL/kg) 906.5 (6.3) 0 (0)   Blood     IV Piggyback 100    Total Intake(mL/kg) 2586.5 (18) 400 (2.8)   Urine (mL/kg/hr) 1445 (0.4) 400 (0.2)   Emesis/NG output     Chest Tube 150 (0)    Total Output 1595 400   Net +991.5 0          . sodium chloride       Lab Results  Component Value Date   WBC 11.7 (H) 03/24/2017   HGB 11.6 (L) 03/24/2017   HCT 34.4 (L) 03/24/2017   PLT 133 (L) 03/24/2017   GLUCOSE 125 (H) 03/24/2017   CHOL 190 03/18/2017   TRIG 331 (H) 03/18/2017   HDL 29 (L) 03/18/2017   LDLCALC 95 03/18/2017   ALT 64 (H) 03/24/2017   AST 40 03/24/2017   NA 128 (L) 03/24/2017   K 4.0 03/24/2017   CL 94 (L) 03/24/2017   CREATININE 0.75 03/24/2017   BUN 20 03/24/2017   CO2 28 03/24/2017   INR 1.28 03/22/2017   HGBA1C 6.0 (H) 03/18/2017   Tubes out Voiding Anxious   [name] MD  Beeper [phone] Office [phone] 03/24/2017 6:38 PM

## 2017-03-24 NOTE — Progress Notes (Signed)
Patient ID: David Frost, male   DOB: 03-18-1951, 66 y.o.   MRN: [id number] TCTS DAILY ICU PROGRESS NOTE                   [address]            [place] [address]          [phone]   2 Days Post-Op Procedure(s) (LRB): CORONARY ARTERY BYPASS GRAFTING (CABG) x6, using the left internal mammary artery and the greater saphenous vein harvested endoscopically from the right thigh and calf and the left thigh. -LIMA to LAD -SVG to RAMUS INTERMEDIATE -SEQ SVG to OM1 and DISTAL LEFT CIRCUMFLEX -SEQ SVG to PDA and PLVB (N/A) TRANSESOPHAGEAL ECHOCARDIOGRAM (TEE) (N/A)  Total Length of Stay:  LOS: 7 days   Subjective: Up to chair, good respiratory effort   Objective: Vital signs in last 24 hours: Temp:  [97.3 F (36.3 C)-99.1 F (37.3 C)] 99.1 F (37.3 C) (07/04 0401) Pulse Rate:  [77-119] 85 (07/04 0600) Cardiac Rhythm: Sinus tachycardia;Bundle branch block (07/03 2000) Resp:  [12-21] 15 (07/04 0600) BP: (73-144)/(44-100) 109/48 (07/04 0600) SpO2:  [93 %-100 %] 96 % (07/04 0600) Arterial Line BP: (124-146)/(64-70) 124/64 (07/03 0845) Weight:  [316 lb 2.2 oz (143.4 kg)] 316 lb 2.2 oz (143.4 kg) (07/04 0457)  Filed Weights   03/22/17 0442 03/23/17 0500 03/24/17 0457  Weight: 299 lb 11.2 oz (135.9 kg) (!) 313 lb (142 kg) (!) 316 lb 2.2 oz (143.4 kg)    Weight change: 3 lb 2.2 oz (1.424 kg)   Hemodynamic parameters for last 24 hours: PAP: (30-37)/(23-26) 30/24  Intake/Output from previous day: 07/03 0701 - 07/04 0700 In: 2586.5 [P.O.:1580; I.V.:906.5; IV Piggyback:100] Out: 1595 [Urine:1445; Chest Tube:150]  Intake/Output this shift: No intake/output data recorded.  Current Meds: Scheduled Meds: . acetaminophen  1,000 mg Oral Q6H   Or  . acetaminophen (TYLENOL) oral liquid 160 mg/5 mL  1,000 mg Per Tube Q6H  . aspirin EC  325 mg Oral Daily   Or  . aspirin  324 mg Per Tube Daily  . atorvastatin  80 mg Oral q1800  . bisacodyl  10 mg Oral Daily   Or    . bisacodyl  10 mg Rectal Daily  . brimonidine  1 drop Both Eyes BID   And  . timolol  1 drop Both Eyes BID  . Chlorhexidine Gluconate Cloth  6 each Topical Daily  . docusate sodium  200 mg Oral Daily  . enoxaparin (LOVENOX) injection  40 mg Subcutaneous QHS  . folic acid  1 mg Oral Daily  . irbesartan  300 mg Oral Daily   And  . hydrochlorothiazide  25 mg Oral Daily  . insulin aspart  0-24 Units Subcutaneous Q4H  . insulin detemir  30 Units Subcutaneous Daily  . metoprolol tartrate  12.5 mg Oral BID   Or  . metoprolol tartrate  12.5 mg Per Tube BID  . multivitamin with minerals  1 tablet Oral Daily  . mupirocin ointment  1 application Nasal BID  . pantoprazole  40 mg Oral Daily  . sodium chloride flush  3 mL Intravenous Q12H  . thiamine  100 mg Oral Daily   Continuous Infusions: . sodium chloride Stopped (03/23/17 1005)  . sodium chloride Stopped (03/24/17 0733)  . dexmedetomidine (PRECEDEX) IV infusion Stopped (03/22/17 1859)  . DOPamine 3 mcg/kg/min (03/24/17 0600)  . insulin (NOVOLIN-R) infusion Stopped (03/23/17 1420)  . nitroGLYCERIN Stopped (03/24/17  4627)  . phenylephrine (NEO-SYNEPHRINE) Adult infusion Stopped (03/23/17 0700)   PRN Meds:.sodium chloride, acetaminophen, diphenhydrAMINE, metoprolol tartrate, midazolam, morphine injection, nitroGLYCERIN, ondansetron (ZOFRAN) IV, oxyCODONE, sodium chloride flush, sodium chloride flush, traMADol  General appearance: alert and cooperative Neurologic: intact Heart: regular rate and rhythm, S1, S2 normal, no murmur, click, rub or gallop Lungs: diminished breath sounds bibasilar Abdomen: soft, non-tender; bowel sounds normal; no masses,  no organomegaly Extremities: extremities normal, atraumatic, no cyanosis or edema and Homans sign is negative, no sign of DVT Wound: sternum stable  Lab Results: CBC: Recent Labs  03/23/17 1551 03/23/17 1557 03/24/17 0539  WBC 13.1*  --  11.7*  HGB 12.4* 12.6* 11.6*  HCT 36.1*  37.0* 34.4*  PLT 145*  --  133*   BMET:  Recent Labs  03/23/17 0404  03/23/17 1557 03/24/17 0259  NA 130*  --  130* 128*  K 4.2  --  4.3 4.0  CL 98*  --  90* 94*  CO2 27  --   --  28  GLUCOSE 117*  --  124* 125*  BUN 14  --  19 20  CREATININE 0.69  < > 0.80 0.75  CALCIUM 8.0*  --   --  8.3*  < > = values in this interval not displayed.  CMET: Lab Results  Component Value Date   WBC 11.7 (H) 03/24/2017   HGB 11.6 (L) 03/24/2017   HCT 34.4 (L) 03/24/2017   PLT 133 (L) 03/24/2017   GLUCOSE 125 (H) 03/24/2017   CHOL 190 03/18/2017   TRIG 331 (H) 03/18/2017   HDL 29 (L) 03/18/2017   LDLCALC 95 03/18/2017   ALT 64 (H) 03/24/2017   AST 40 03/24/2017   NA 128 (L) 03/24/2017   K 4.0 03/24/2017   CL 94 (L) 03/24/2017   CREATININE 0.75 03/24/2017   BUN 20 03/24/2017   CO2 28 03/24/2017   INR 1.28 03/22/2017   HGBA1C 6.0 (H) 03/18/2017    PT/INR:  Recent Labs  03/22/17 1540  LABPROT 16.1*  INR 1.28   Radiology: Dg Chest Port 1 View  Result Date: 03/24/2017 CLINICAL DATA:  Followup CABG and chest tube. EXAM: PORTABLE CHEST 1 VIEW COMPARISON:  03/23/2017 FINDINGS: Sternotomy wires unchanged. Left lateral basilar chest tube unchanged. Right IJ central venous sheath remains in place at the level of the head of the right clavicle. Lungs are adequately inflated with suggestion of subtle linear density left base likely atelectasis. No pneumothorax. Mild stable cardiomegaly. Remainder of the exam is unchanged. IMPRESSION: Subtle linear density left base unchanged likely atelectasis. Stable cardiomegaly. Tubes and lines as described. Electronically Signed   By: Marin Olp M.D.   On: 03/24/2017 07:53     Assessment/Plan: S/P Procedure(s) (LRB): CORONARY ARTERY BYPASS GRAFTING (CABG) x6, using the left internal mammary artery and the greater saphenous vein harvested endoscopically from the right thigh and calf and the left thigh. -LIMA to LAD -SVG to RAMUS INTERMEDIATE -SEQ SVG to  OM1 and DISTAL LEFT CIRCUMFLEX -SEQ SVG to PDA and PLVB (N/A) TRANSESOPHAGEAL ECHOCARDIOGRAM (TEE) (N/A) Mobilize Diuresis Diabetes control d/c tubes/lines Plan for transfer to step-down: see transfer orders See progression orders Mild hyponatremia No increase in anion gap, po diabetic meds on hold - associated with euglycemic metabolic acidosis   I have seen and examined [name] and agree with the above assessment  and plan.  [name] MD Beeper [phone] Office [phone] 03/24/2017 8:02 AM      Lilia Argue   03/24/2017 7:57 AM

## 2017-03-25 ENCOUNTER — Inpatient Hospital Stay (HOSPITAL_COMMUNITY): Payer: Medicare Other

## 2017-03-25 LAB — BASIC METABOLIC PANEL
Anion gap: 6 (ref 5–15)
BUN: 21 mg/dL — ABNORMAL HIGH (ref 6–20)
CO2: 30 mmol/L (ref 22–32)
Calcium: 8.4 mg/dL — ABNORMAL LOW (ref 8.9–10.3)
Chloride: 91 mmol/L — ABNORMAL LOW (ref 101–111)
Creatinine, Ser: 0.88 mg/dL (ref 0.61–1.24)
GFR calc Af Amer: >60 mL/min (ref >60)
GFR calc non Af Amer: >60 mL/min (ref >60)
Glucose, Bld: 125 mg/dL — ABNORMAL HIGH (ref 65–99)
Potassium: 4.3 mmol/L (ref 3.5–5.1)
Sodium: 127 mmol/L — ABNORMAL LOW (ref 135–145)

## 2017-03-25 LAB — GLUCOSE, CAPILLARY
GLUCOSE-CAPILLARY: 127 mg/dL — AB (ref 65–99)
GLUCOSE-CAPILLARY: 116 mg/dL — AB (ref 65–99)
Glucose-Capillary: 120 mg/dL — ABNORMAL HIGH (ref 65–99)

## 2017-03-25 LAB — CBC
HCT: 32 % — ABNORMAL LOW (ref 39.0–52.0)
Hemoglobin: 10.9 g/dL — ABNORMAL LOW (ref 13.0–17.0)
MCH: 30.9 pg (ref 26.0–34.0)
MCHC: 34.1 g/dL (ref 30.0–36.0)
MCV: 90.7 fL (ref 78.0–100.0)
Platelets: 161 10*3/uL (ref 150–400)
RBC: 3.53 MIL/uL — ABNORMAL LOW (ref 4.22–5.81)
RDW: 11.9 % (ref 11.5–15.5)
WBC: 12.5 10*3/uL — ABNORMAL HIGH (ref 4.0–10.5)

## 2017-03-25 MED ORDER — POLYETHYLENE GLYCOL 3350 17 G PO PACK
17.0000 g | PACK | Freq: Every day | ORAL | Status: DC
  Administered 2017-03-25 – 2017-03-26 (×2): 17 g via ORAL
  Filled 2017-03-25 (×2): qty 1

## 2017-03-25 NOTE — Progress Notes (Signed)
Patient's midsternal wound dressing was removed as per order, 72 hours postop.  Incision is clean, dry and intact, painted with povidone and left open to air.  Will continue to monitor.

## 2017-03-25 NOTE — Progress Notes (Signed)
Patient transported to 2W19 via wheel chair with all personal belongings placed in room. Patient on room air. All VS stable throughout. NT and RN to bedside. Wife at bedside.  Levon Hedger, RN

## 2017-03-25 NOTE — Progress Notes (Signed)
Patient ID: David Frost, male   DOB: 10-04-1950, 66 y.o.   MRN: [id number]      [address]       [place] [address]             [phone]                 3 Days Post-Op Procedure(s) (LRB): CORONARY ARTERY BYPASS GRAFTING (CABG) x6, using the left internal mammary artery and the greater saphenous vein harvested endoscopically from the right thigh and calf and the left thigh. -LIMA to LAD -SVG to RAMUS INTERMEDIATE -SEQ SVG to OM1 and DISTAL LEFT CIRCUMFLEX -SEQ SVG to PDA and PLVB (N/A) TRANSESOPHAGEAL ECHOCARDIOGRAM (TEE) (N/A)  LOS: 8 days   Subjective: Less anxious this am, now on tele bed, was moved at 12:30 at night , patient family concerned about that  Objective: Vital signs in last 24 hours: Patient Vitals for the past 24 hrs:  BP Temp Temp src Pulse Resp SpO2 Weight  03/25/17 0647 131/68 98.8 F (37.1 C) Oral 88 18 98 % (!) 316 lb 12.8 oz (143.7 kg)  03/25/17 0030 - - - - - - (!) 317 lb 10.9 oz (144.1 kg)  03/25/17 0023 128/80 97.9 F (36.6 C) Oral 79 - 98 % -  03/24/17 2344 - 99.1 F (37.3 C) Oral - - - -  03/24/17 2300 116/70 - - 77 15 94 % -  03/24/17 2200 111/78 - - 86 19 96 % -  03/24/17 2112 (!) 146/79 - - - - - -  03/24/17 2100 - - - - 20 - -  03/24/17 2000 - - - - 18 - -  03/24/17 1935 - 99.4 F (37.4 C) Oral - - - -  03/24/17 1900 108/61 - - 89 17 96 % -  03/24/17 1800 113/66 - - 85 20 100 % -  03/24/17 1700 124/77 - - 81 19 98 % -  03/24/17 1600 (!) 105/59 - - 79 15 95 % -  03/24/17 1542 - 99.4 F (37.4 C) Oral - - - -  03/24/17 1530 (!) 107/57 - - 82 15 96 % -  03/24/17 1500 93/70 - - 85 19 99 % -  03/24/17 1412 111/61 - - - 19 98 % -  03/24/17 1400 - - - - 18 - -  03/24/17 1314 - 99.2 F (37.3 C) Oral - - - -    Filed Weights   03/24/17 0457 03/25/17 0030 03/25/17 0647  Weight: (!) 316 lb 2.2 oz (143.4 kg) (!) 317 lb 10.9 oz (144.1 kg) (!) 316 lb 12.8 oz (143.7 kg)    Hemodynamic parameters for last 24 hours:     Intake/Output from previous day: 07/04 0701 - 07/05 0700 In: 650 [P.O.:650] Out: 400 [Urine:400] Intake/Output this shift: No intake/output data recorded.  Scheduled Meds: . aspirin EC  325 mg Oral Daily  . atorvastatin  80 mg Oral q1800  . brimonidine  1 drop Both Eyes BID  . Chlorhexidine Gluconate Cloth  6 each Topical Daily  . docusate sodium  200 mg Oral Daily  . enoxaparin (LOVENOX) injection  40 mg Subcutaneous QHS  . folic acid  1 mg Oral Daily  . furosemide  40 mg Oral Daily  . insulin aspart  0-24 Units Subcutaneous TID AC & HS  . insulin detemir  30 Units Subcutaneous Daily  . irbesartan  300 mg Oral Daily  . metoprolol tartrate  12.5 mg Oral  BID  . multivitamin with minerals  1 tablet Oral Daily  . mupirocin ointment  1 application Nasal BID  . pantoprazole  40 mg Oral QAC breakfast  . sodium chloride flush  3 mL Intravenous Q12H  . thiamine  100 mg Oral Daily   Continuous Infusions: . sodium chloride     PRN Meds:.sodium chloride, acetaminophen, ALPRAZolam, bisacodyl **OR** bisacodyl, diphenhydrAMINE, ondansetron **OR** ondansetron (ZOFRAN) IV, oxyCODONE, phenol, sodium chloride flush, traMADol  General appearance: alert and cooperative Neurologic: intact Heart: regular rate and rhythm, S1, S2 normal, no murmur, click, rub or gallop Lungs: diminished breath sounds bibasilar Abdomen: soft, non-tender; bowel sounds normal; no masses,  no organomegaly Extremities: extremities normal, atraumatic, no cyanosis or edema and Homans sign is negative, no sign of DVT Wound: sternum intact  Lab Results: CBC: Recent Labs  03/24/17 0539 03/25/17 0256  WBC 11.7* 12.5*  HGB 11.6* 10.9*  HCT 34.4* 32.0*  PLT 133* 161   BMET:  Recent Labs  03/24/17 0259 03/25/17 0256  NA 128* 127*  K 4.0 4.3  CL 94* 91*  CO2 28 30  GLUCOSE 125* 125*  BUN 20 21*  CREATININE 0.75 0.88  CALCIUM 8.3* 8.4*    PT/INR:  Recent Labs  03/22/17 1540  LABPROT 16.1*  INR 1.28      Radiology Dg Chest 2 View  Result Date: 03/25/2017 CLINICAL DATA:  Postoperative soreness ; CABG on March 22, 2017 EXAM: CHEST  2 VIEW COMPARISON:  Portable chest x-ray of March 24, 2017 FINDINGS: The lungs are adequately inflated. There is a small left pleural effusion and trace right pleural effusion. There is no alveolar infiltrate. There is no pneumothorax. The interstitial markings are mildly prominent but stable The cardiac silhouette is enlarged. The pulmonary vascularity is not engorged. The sternal wires are intact. The mediastinum is normal in width. The retrosternal soft tissues are normal. IMPRESSION: Small bilateral pleural effusions greatest on the left. No evidence of pneumonia nor pneumothorax. Mild interstitial prominence likely reflects stable low-grade edema. Electronically Signed   By: David  Martinique M.D.   On: 03/25/2017 07:10   Dg Chest Port 1 View  Result Date: 03/24/2017 CLINICAL DATA:  Followup CABG and chest tube. EXAM: PORTABLE CHEST 1 VIEW COMPARISON:  03/23/2017 FINDINGS: Sternotomy wires unchanged. Left lateral basilar chest tube unchanged. Right IJ central venous sheath remains in place at the level of the head of the right clavicle. Lungs are adequately inflated with suggestion of subtle linear density left base likely atelectasis. No pneumothorax. Mild stable cardiomegaly. Remainder of the exam is unchanged. IMPRESSION: Subtle linear density left base unchanged likely atelectasis. Stable cardiomegaly. Tubes and lines as described. Electronically Signed   By: Marin Olp M.D.   On: 03/24/2017 07:53     Assessment/Plan: S/P Procedure(s) (LRB): CORONARY ARTERY BYPASS GRAFTING (CABG) x6, using the left internal mammary artery and the greater saphenous vein harvested endoscopically from the right thigh and calf and the left thigh. -LIMA to LAD -SVG to RAMUS INTERMEDIATE -SEQ SVG to OM1 and DISTAL LEFT CIRCUMFLEX -SEQ SVG to PDA and PLVB (N/A) TRANSESOPHAGEAL  ECHOCARDIOGRAM (TEE) (N/A) Mobilize Diuresis Diabetes control Renal function stable Poss home sat or sunday  [name] MD 03/25/2017 8:59 AM

## 2017-03-25 NOTE — Progress Notes (Signed)
CARDIAC REHAB PHASE I   PRE:  Rate/Rhythm: 76 SR      Up in hall with RN pushing w/c  MODE:  Ambulation: 550 ft   POST:  Rate/Rhythm: 106 ST  BP:  Supine:   Sitting: 137/68  Standing:    SaO2: 95%RA 1400-1435 Pt up in hall with RN pushing wheelchair. Took over walk. Encouraged pt to use rolling walker next walk as safer. Pt stopping very frequently to rest and lean on wheel chair. Sats good on RA and he stated he is getting 1500 to 2000 ml. Pt tires very easily. Encouraged pursed lip breathing. To chair in room.  Encouraged walks and IS. Emotional support given as pt seems anxious about recovery.   Graylon Good, RN BSN  03/25/2017 2:30 PM

## 2017-03-25 NOTE — Care Management Important Message (Signed)
Important Message  Patient Details  Name: LENNEX PIETILA MRN: [id number] Date of Birth: 06-06-1951   Medicare Important Message Given:  Yes    Carles Collet, RN 03/25/2017, 10:18 AM

## 2017-03-26 LAB — GLUCOSE, CAPILLARY
GLUCOSE-CAPILLARY: 107 mg/dL — AB (ref 65–99)
Glucose-Capillary: 127 mg/dL — ABNORMAL HIGH (ref 65–99)
Glucose-Capillary: 130 mg/dL — ABNORMAL HIGH (ref 65–99)
Glucose-Capillary: 98 mg/dL (ref 65–99)

## 2017-03-26 MED ORDER — IRBESARTAN 150 MG PO TABS: 75.0000 mg | ORAL_TABLET | Freq: Every day | ORAL | Status: DC

## 2017-03-26 MED ORDER — POLYETHYLENE GLYCOL 3350 17 G PO PACK: 17.0000 g | PACK | Freq: Every day | ORAL | Status: DC

## 2017-03-26 MED ORDER — LACTULOSE 10 GM/15ML PO SOLN
20.0000 g | Freq: Once | ORAL | Status: AC
  Administered 2017-03-26: 20 g via ORAL
  Filled 2017-03-26: qty 30

## 2017-03-26 MED ORDER — POTASSIUM CHLORIDE CRYS ER 20 MEQ PO TBCR
40.0000 meq | EXTENDED_RELEASE_TABLET | Freq: Every day | ORAL | Status: DC
  Administered 2017-03-26: 40 meq via ORAL
  Filled 2017-03-26: qty 2

## 2017-03-26 MED ORDER — CANAGLIFLOZIN 300 MG PO TABS
300.0000 mg | ORAL_TABLET | Freq: Every day | ORAL | Status: DC
  Administered 2017-03-26: 300 mg via ORAL
  Filled 2017-03-26 (×2): qty 1

## 2017-03-26 MED ORDER — METOLAZONE 5 MG PO TABS
5.0000 mg | ORAL_TABLET | Freq: Every day | ORAL | Status: DC
  Administered 2017-03-26: 5 mg via ORAL
  Filled 2017-03-26: qty 1

## 2017-03-26 MED ORDER — FUROSEMIDE 10 MG/ML IJ SOLN
40.0000 mg | Freq: Once | INTRAMUSCULAR | Status: AC
  Administered 2017-03-26: 40 mg via INTRAVENOUS
  Filled 2017-03-26: qty 4

## 2017-03-26 NOTE — Progress Notes (Addendum)
[address]       [place] [address]             [phone]      4 Days Post-Op Procedure(s) (LRB): CORONARY ARTERY BYPASS GRAFTING (CABG) x6, using the left internal mammary artery and the greater saphenous vein harvested endoscopically from the right thigh and calf and the left thigh. -LIMA to LAD -SVG to RAMUS INTERMEDIATE -SEQ SVG to OM1 and DISTAL LEFT CIRCUMFLEX -SEQ SVG to PDA and PLVB (N/A) TRANSESOPHAGEAL ECHOCARDIOGRAM (TEE) (N/A)   Subjective:  [name] complains of needing to move his bowels.  He states he hasn't moved them since Saturday.  + ambulation independently.  Objective: Vital signs in last 24 hours: Temp:  [97.6 F (36.4 C)-98.6 F (37 C)] 97.6 F (36.4 C) (07/06 0703) Pulse Rate:  [81-88] 87 (07/06 0703) Cardiac Rhythm: Heart block;Bundle branch block (07/06 0700) Resp:  [18-20] 18 (07/06 0703) BP: (109-150)/(70-77) 150/75 (07/06 0703) SpO2:  [97 %-98 %] 98 % (07/06 0703) Weight:  [318 lb 5.5 oz (144.4 kg)] 318 lb 5.5 oz (144.4 kg) (07/06 0525)  Intake/Output from previous day: 07/05 0701 - 07/06 0700 In: 720 [P.O.:720] Out: -   General appearance: alert, cooperative and no distress Heart: regular rate and rhythm Lungs: clear to auscultation bilaterally Abdomen: soft, non-tender; bowel sounds normal; no masses,  no organomegaly Extremities: edema 1-2+ pitting  Wound: clean, some mild oozing from RLE stab incision  Lab Results:  Recent Labs  03/24/17 0539 03/25/17 0256  WBC 11.7* 12.5*  HGB 11.6* 10.9*  HCT 34.4* 32.0*  PLT 133* 161   BMET:  Recent Labs  03/24/17 0259 03/25/17 0256  NA 128* 127*  K 4.0 4.3  CL 94* 91*  CO2 28 30  GLUCOSE 125* 125*  BUN 20 21*  CREATININE 0.75 0.88  CALCIUM 8.3* 8.4*    PT/INR: No results for input(s): LABPROT, INR in the last 72 hours. ABG    Component Value Date/Time   PHART 7.414 03/22/2017 2253   HCO3 32.5 (H) 03/22/2017 2253   TCO2 27 03/23/2017 1557   O2SAT  97.0 03/22/2017 2253   CBG (last 3)   Recent Labs  03/25/17 1612 03/25/17 2124 03/26/17 0642  GLUCAP 120* 116* 127*    Assessment/Plan: S/P Procedure(s) (LRB): CORONARY ARTERY BYPASS GRAFTING (CABG) x6, using the left internal mammary artery and the greater saphenous vein harvested endoscopically from the right thigh and calf and the left thigh. -LIMA to LAD -SVG to RAMUS INTERMEDIATE -SEQ SVG to OM1 and DISTAL LEFT CIRCUMFLEX -SEQ SVG to PDA and PLVB (N/A) TRANSESOPHAGEAL ECHOCARDIOGRAM (TEE) (N/A)  1. CV- NSR with 1st degree AV block, +HTN- will continue Lopressor at current dose, on home dose of Avapro, will titrate his BB to 25 mg BID, if remains hypertensive can start Norvasc tomorrow 2. Pulm- no acute issues, continue IS 3. Renal- weight is up 18lbs since admission, + LE edema- will give IV Lasix today with dose of Metolzaone 4. GI- constipation- lactulose today, miralax daily to facilitate further BM 5. DM- sugars well controlled 6. Dispo- patient stable, will d/c EPW today, restart home Diovan at reduced dose, diuresis, LOC constipation, repeat BMET in AM   LOS: 9 days    BARRETT, David Frost 03/26/2017  poss home 1-2 days More meds for bowels today Resume INVOKANA and d/c llevemir I have seen and examined David Frost and agree with the above assessment  and plan.  Percell Miller  Maryruth Bun MD Beeper [phone] Office [phone] 03/26/2017 9:47 AM

## 2017-03-26 NOTE — Progress Notes (Signed)
Patient education completed for removal of pacer wires, patient verbalized understanding, 4 pacer wires removed as ordered , patient now on bedrest, bed in lowest position, call bell within reach, will continue to monitor.

## 2017-03-26 NOTE — Progress Notes (Signed)
CARDIAC REHAB PHASE I   PRE:  Rate/Rhythm: 73 SR 1HB Up in hall with wheelchair BP:     SaO2: 97%RA  MODE:  Ambulation: 150 ft X 2  POST:  Rate/Rhythm: 112   BP:  Supine:   Sitting: 141/79  And then 142/66  Standing:     SaO2: 97%RA 1337-1425 Pt up in hall walking pushing wheel chair. Tried to get pt to go farther than 150 ft. Stated tired and needed to rest. Sat in room and sats at 97%RA and BP 141/79.  Education completed with pt and his significant other who voiced understanding. Encouraged IS and sternal precautions, and ex ed,. Gave diabetic and heart healthy diets. Discussed CRP 2 and will refer to [place].  Walked with pt another 150 ft using rolling walker;. Significant other stated she has walker available at home if needed. Encouraged pt to go farther next walks. Discussed how to view discharge video.    Graylon Good, RN BSN  03/26/2017 2:18 PM

## 2017-03-26 NOTE — Discharge Summary (Signed)
Physician Discharge Summary  Patient ID: David Frost MRN: [id number] DOB/AGE: 1951/01/18 66 y.o.  Admit date: 03/17/2017 Discharge date: 03/27/2017  Admission Diagnoses: Patient Active Problem List   Diagnosis Date Noted  . DM2 (diabetes mellitus, type 2) ([place]) 03/17/2017  . CAD, multiple vessel 03/17/2017  . Non-ST elevation (NSTEMI) myocardial infarction ([place]) 03/17/2017  . Anxiety 03/17/2017  . Alcohol abuse 03/17/2017  . NSTEMI (non-ST elevated myocardial infarction) ([place]) 03/17/2017  . Essential hypertension 06/14/2014  . Morbid obesity ([place]) 06/14/2014  . Dietary indiscretion 06/14/2014  . Obstructive sleep apnea 06/14/2014  . Right bundle branch block 06/14/2014  . OBESITY, UNSPECIFIED 10/17/2009    Discharge Diagnoses:  Principal Problem:   Non-ST elevation (NSTEMI) myocardial infarction [facility]) Active Problems:   Essential hypertension   Morbid obesity ([facility])   Obstructive sleep apnea   Right bundle branch block   DM2 (diabetes mellitus, type 2) ([facility])   CAD, multiple vessel   Anxiety   Alcohol abuse   NSTEMI (non-ST elevated myocardial infarction) [facility])   Discharged Condition: good  HPI:  This is a 66 year old male with a past medical history of hypertension, hyperlipidemia, DM (type 2), morbid obesity, OSA, cigar use, and alcohol abuse who initially presented to [facility] in [place], [place] on 03/15/2017 with complaints of substernal aching pain. 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 Upon further inquiry, he has had episodes like this for over a year. He described the pain as aching/burning across his chest. He denied any radiation, nausea, emesis, shortness of breath, or diaphoresis. He took 4 baby aspirin but thought it might be from reflux.  EKG showed no acute ST-T changes Echocardiogram showed LVEF 70%, moderate LVH, no significant valvular disease, and no pericardial effusion or  thrombus. Subsequent EKG showed ST segment depression in leads V1-V3. In addition, Troponin I was 1.49 and later went as high as 3.550. He ruled in for a NSTEMI.   He was then transferred to [facility] on 03/16/2017 for further evaluation and treatment. He underwent a cardiac catheterization on 03/16/2017. He lives in [place] and [place] when the aforementioned occurred. He requested transfer to [facility], as cardiologist, Dr. Gwenlyn Found, is here. Currently, the patient denies chest pain or shortness of breath.   Hospital Course:  On 03/22/2017 Mr. Bielby underwent a coronary artery bypass grafting x 6 with Dr. Servando Snare. He was transferred to the ICU and extubated in a timely manner. POD 1 He continued to progress. We started a diuretic regimen. We started to mobilize the patient. He did have some expected blood loss anemia. POD 2 we discontinued his chest tubes and lines.  POD 3 the patient was transferred to the telemetry unit without issue. His renal function remained stable. We continue to monitor blood glucose control closely. POD 4 we mobilized the patient. He remained fluid overload therefore we continued his diuretic regimen. We increased Lopressor for hypertension. We resumed Invokana and discontinued levemir for blood glucose control. The patient was tolerating room air at this time. Today, he is tolerating room air, ambulating with limited assistance, his incisions are healing well, and he is ready for discharge.    Consults: Cardiology  Significant Diagnostic Studies:  CLINICAL DATA:  Postoperative soreness ; CABG on March 22, 2017  EXAM: CHEST  2 VIEW  COMPARISON:  Portable chest x-ray of March 24, 2017  FINDINGS: The  lungs are adequately inflated. There is a small left pleural effusion and trace right pleural effusion. There is no alveolar infiltrate. There is no pneumothorax. The interstitial markings are mildly prominent but stable The  cardiac silhouette is enlarged. The pulmonary vascularity is not engorged. The sternal wires are intact. The mediastinum is normal in width. The retrosternal soft tissues are normal.  IMPRESSION: Small bilateral pleural effusions greatest on the left. No evidence of pneumonia nor pneumothorax. Mild interstitial prominence likely reflects stable low-grade edema.   Electronically Signed   By: David  Martinique M.D.   On: 03/25/2017 07:10  Treatments:   NAME:  BAYLIN, GAMBLIN                    ACCOUNT NO.:  MEDICAL RECORD NO.:  [id number]  LOCATION:                                 FACILITY:  PHYSICIAN:  Lanelle Bal, MD         DATE OF BIRTH:  DATE OF PROCEDURE:  03/22/2017 DATE OF DISCHARGE:                              OPERATIVE REPORT   PREOPERATIVE DIAGNOSIS:  Recent non-ST-elevation myocardial infarction with 3-vessel coronary artery disease.  POSTOPERATIVE DIAGNOSIS:  Recent non-ST-elevation myocardial infarction with 3-vessel coronary artery disease.  SURGICAL PROCEDURE:  Coronary artery bypass grafting x6 with the left internal mammary to the left anterior descending coronary artery, reverse saphenous vein graft to the intermediate coronary, sequential reverse saphenous vein graft to the first obtuse marginal and distal circumflex, sequential reverse saphenous vein graft to the posterior descending and posterolateral branches of the right coronary artery with endoscopic vein harvesting of the right greater saphenous vein, thigh and calf, and left greater saphenous vein, thigh.  SURGEON:  Lanelle Bal, MD  FIRST ASSISTANT:  Ellwood Handler, PA.  Disposition: Home  Discharge Instructions    Amb Referral to Cardiac Rehabilitation    Complete by:  As directed    Diagnosis:  CABG   CABG X ___:  6     Discharge Medications:  Allergies as of 03/27/2017      Reactions   No Known Allergies       Medication List    STOP taking these medications    valsartan-hydrochlorothiazide 320-25 MG tablet Commonly known as:  DIOVAN-HCT     TAKE these medications   acetaminophen 325 MG tablet Commonly known as:  TYLENOL Take 2 tablets (650 mg total) by mouth every 6 (six) hours as needed for mild pain.   ALPRAZolam 0.25 MG tablet Commonly known as:  XANAX Take 1 tablet (0.25 mg total) by mouth 2 (two) times daily as needed for anxiety.   aspirin 325 MG EC tablet Take 1 tablet (325 mg total) by mouth daily. What changed:  medication strength  how much to take  additional instructions   atorvastatin 80 MG tablet Commonly known as:  LIPITOR Take 1 tablet (80 mg total) by mouth daily at 6 PM.   COMBIGAN 0.2-0.5 % ophthalmic solution Generic drug:  brimonidine-timolol Place 1 drop into both eyes 2 (two) times daily.   folic acid 1 MG tablet Commonly known as:  FOLVITE Take 1 tablet (1 mg total) by mouth daily.   furosemide 40 MG tablet Commonly known as:  LASIX Take 1  tablet (40 mg total) by mouth daily.   INVOKANA 300 MG Tabs tablet Generic drug:  canagliflozin Take 300 mg by mouth daily.   metolazone 5 MG tablet Commonly known as:  ZAROXOLYN Take 1 tablet (5 mg total) by mouth daily.   metoprolol succinate 25 MG 24 hr tablet Commonly known as:  TOPROL-XL Take 25 mg by mouth daily.   multivitamin with minerals Tabs tablet Take 1 tablet by mouth daily.   potassium chloride SA 20 MEQ tablet Commonly known as:  K-DUR,KLOR-CON Take 2 tablets (40 mEq total) by mouth daily.   thiamine 100 MG tablet Take 1 tablet (100 mg total) by mouth daily.   traMADol 50 MG tablet Commonly known as:  ULTRAM Take 1-2 tablets (50-100 mg total) by mouth every 4 (four) hours as needed for moderate pain.   valsartan 320 MG tablet Commonly known as:  DIOVAN Take 1 tablet (320 mg total) by mouth daily.      Follow-up Information    [name], MD Follow up.   Specialty:  Cardiothoracic Surgery Why:  Your appointment is  on 04/29/2017 at 3:00pm. Please arrive at 2:30pm for a chest xray at [facility] located on the first floor of our building.  Contact information: [address] [place] [address] [phone]        [name], MD. Call in 1 day(s).   Specialty:  Internal Medicine Contact information: [address] [place] [address] [phone]        Lonn Georgia, PA-C Follow up.   Specialties:  Cardiology, Radiology Why:  Your appointment is on 04/19/2017 at 8:30am at the [facility] office. Please bring your medication list.  Contact information: [place] [address] [phone]        [facility] Follow up on 04/02/2017.   Specialty:  Cardiothoracic Surgery Why:  Appointment is at 10:30 for suture removal Contact information: [place], [place] [phone]         The patient has been discharged on:   1.Beta Blocker:  Yes [ x  ]                              No   [   ]                              If No, reason:  2.Ace Inhibitor/ARB: Yes [ x  ]                                     No  [    ]                                     If No, reason:  3.Statin:   Yes [ x  ]                  No  [   ]                  If No, reason:  4.Ecasa:  Yes  [ x  ]  No   [   ]                  If No, reason:    Signed: BARRETT, ERIN 03/27/2017, 9:23 AM

## 2017-03-26 NOTE — Discharge Instructions (Signed)

## 2017-03-27 ENCOUNTER — Other Ambulatory Visit: Payer: Self-pay | Admitting: Physician Assistant

## 2017-03-27 LAB — BASIC METABOLIC PANEL
ANION GAP: 8 (ref 5–15)
BUN: 18 mg/dL (ref 6–20)
CALCIUM: 8.9 mg/dL (ref 8.9–10.3)
CO2: 32 mmol/L (ref 22–32)
Chloride: 87 mmol/L — ABNORMAL LOW (ref 101–111)
Creatinine, Ser: 0.91 mg/dL (ref 0.61–1.24)
Glucose, Bld: 121 mg/dL — ABNORMAL HIGH (ref 65–99)
Potassium: 4.1 mmol/L (ref 3.5–5.1)
SODIUM: 127 mmol/L — AB (ref 135–145)

## 2017-03-27 LAB — GLUCOSE, CAPILLARY: GLUCOSE-CAPILLARY: 113 mg/dL — AB (ref 65–99)

## 2017-03-27 MED ORDER — ASPIRIN 325 MG PO TBEC: 325.0000 mg | DELAYED_RELEASE_TABLET | Freq: Every day | ORAL | 0 refills | Status: DC

## 2017-03-27 MED ORDER — TRAMADOL HCL 50 MG PO TABS: 50.0000 mg | ORAL_TABLET | ORAL | 0 refills | Status: DC | PRN

## 2017-03-27 MED ORDER — POTASSIUM CHLORIDE CRYS ER 20 MEQ PO TBCR: 40.0000 meq | EXTENDED_RELEASE_TABLET | Freq: Every day | ORAL | 1 refills | Status: DC

## 2017-03-27 MED ORDER — METOLAZONE 5 MG PO TABS: 5.0000 mg | ORAL_TABLET | Freq: Every day | ORAL | 0 refills | Status: DC

## 2017-03-27 MED ORDER — ADULT MULTIVITAMIN W/MINERALS CH: 1.0000 | ORAL_TABLET | Freq: Every day | ORAL | Status: DC

## 2017-03-27 MED ORDER — ALPRAZOLAM 0.25 MG PO TABS: 0.2500 mg | ORAL_TABLET | Freq: Two times a day (BID) | ORAL | 0 refills | Status: AC | PRN

## 2017-03-27 MED ORDER — ATORVASTATIN CALCIUM 80 MG PO TABS: 80.0000 mg | ORAL_TABLET | Freq: Every day | ORAL | 3 refills | Status: DC

## 2017-03-27 MED ORDER — FUROSEMIDE 40 MG PO TABS: 40.0000 mg | ORAL_TABLET | Freq: Every day | ORAL | 0 refills | Status: DC

## 2017-03-27 MED ORDER — VALSARTAN 320 MG PO TABS: 320.0000 mg | ORAL_TABLET | Freq: Every day | ORAL | 3 refills | Status: DC

## 2017-03-27 MED ORDER — ACETAMINOPHEN 325 MG PO TABS: 650.0000 mg | ORAL_TABLET | Freq: Four times a day (QID) | ORAL | Status: DC | PRN

## 2017-03-27 MED ORDER — FOLIC ACID 1 MG PO TABS: 1.0000 mg | ORAL_TABLET | Freq: Every day | ORAL | 0 refills | Status: DC

## 2017-03-27 MED ORDER — THIAMINE HCL 100 MG PO TABS: 100.0000 mg | ORAL_TABLET | Freq: Every day | ORAL | 0 refills | Status: DC

## 2017-03-27 NOTE — Progress Notes (Signed)
      [address]       [place] [address]             [phone]      5 Days Post-Op Procedure(s) (LRB): CORONARY ARTERY BYPASS GRAFTING (CABG) x6, using the left internal mammary artery and the greater saphenous vein harvested endoscopically from the right thigh and calf and the left thigh. -LIMA to LAD -SVG to RAMUS INTERMEDIATE -SEQ SVG to OM1 and DISTAL LEFT CIRCUMFLEX -SEQ SVG to PDA and PLVB (N/A) TRANSESOPHAGEAL ECHOCARDIOGRAM (TEE) (N/A)   Subjective:  [name] states he is tired, as he didn't sleep well last night.  He states he just really wants to go home as he feels he can rest better there.  + ambulation without difficulty,  + BM  Objective: Vital signs in last 24 hours: Temp:  [98.2 F (36.8 C)-99.1 F (37.3 C)] 99.1 F (37.3 C) (07/07 0557) Pulse Rate:  [75-100] 100 (07/07 0557) Cardiac Rhythm: Bundle branch block;Heart block (07/07 0700) Resp:  [18-20] 18 (07/07 0557) BP: (127-161)/(68-94) 133/77 (07/07 0557) SpO2:  [97 %] 97 % (07/07 0557) Weight:  [310 lb 4.8 oz (140.8 kg)] 310 lb 4.8 oz (140.8 kg) (07/07 0557)  Intake/Output from previous day: 07/06 0701 - 07/07 0700 In: 600 [P.O.:600] Out: -   General appearance: alert, cooperative and no distress Heart: regular rate and rhythm Lungs: clear to auscultation bilaterally Abdomen: soft, non-tender; bowel sounds normal; no masses,  no organomegaly Extremities: edema 2+ Wound: clean, some clear drainge from [facility] sites due to edema  Lab Results:  Recent Labs  03/25/17 0256  WBC 12.5*  HGB 10.9*  HCT 32.0*  PLT 161   BMET:  Recent Labs  03/25/17 0256 03/27/17 0144  NA 127* 127*  K 4.3 4.1  CL 91* 87*  CO2 30 32  GLUCOSE 125* 121*  BUN 21* 18  CREATININE 0.88 0.91  CALCIUM 8.4* 8.9    PT/INR: No results for input(s): LABPROT, INR in the last 72 hours. ABG    Component Value Date/Time   PHART 7.414 03/22/2017 2253   HCO3 32.5 (H) 03/22/2017 2253   TCO2 27  03/23/2017 1557   O2SAT 97.0 03/22/2017 2253   CBG (last 3)   Recent Labs  03/26/17 1622 03/26/17 2124 03/27/17 0606  GLUCAP 98 107* 113*    Assessment/Plan: S/P Procedure(s) (LRB): CORONARY ARTERY BYPASS GRAFTING (CABG) x6, using the left internal mammary artery and the greater saphenous vein harvested endoscopically from the right thigh and calf and the left thigh. -LIMA to LAD -SVG to RAMUS INTERMEDIATE -SEQ SVG to OM1 and DISTAL LEFT CIRCUMFLEX -SEQ SVG to PDA and PLVB (N/A) TRANSESOPHAGEAL ECHOCARDIOGRAM (TEE) (N/A)  1. CV- NSR, BP improved- continue Lopressor, diovan 2. Pulm- no acute issues, continue IS 3. Renal- creatinine WNL, weight came down 8lbs with diuretics yesterday, will continue at discharge 4. DM- sugars controlled 5. Dispo- patient stable, will d/c home today, will have patient come to office Friday 7/13 to remove sutures from [facility] sites   LOS: 10 days    , [name] 03/27/2017

## 2017-03-27 NOTE — Progress Notes (Signed)
Pt. Discharged to home with wife Pt. D/C'd via wheelchair with NT Discharge information reviewed and given along with printed Rx All personal belongings given to Pt.  Education discussed with teach-back IV was d/c Tele d/c  CT sutures removed

## 2017-03-28 ENCOUNTER — Other Ambulatory Visit: Payer: Self-pay | Admitting: Physician Assistant

## 2017-03-29 NOTE — Consult Note (Signed)
           [facility] CM Primary Care Navigator  03/29/2017  David Frost [birth year] [phone]   Went to see patientat the bedsideto identify possible discharge needs but he was alreadydischarged per staffreport.  Patient was discharged home over the weekend.  Primary care provider's office called (DJ)to notify of patient's discharge and need for post hospital follow-up and transition of care. Notifiedof patient's health issues needing follow-up as well.  Made aware to [facility] ifdeemed appropriatefor services.   For questions, please contact:  Dannielle Huh, BSN, RN- [facility] Primary Care Navigator  Telephone: [phone] [place]

## 2017-04-01 ENCOUNTER — Telehealth (HOSPITAL_COMMUNITY): Payer: Self-pay

## 2017-04-01 ENCOUNTER — Telehealth: Payer: Self-pay | Admitting: Physician Assistant

## 2017-04-01 ENCOUNTER — Other Ambulatory Visit: Payer: Self-pay | Admitting: Cardiothoracic Surgery

## 2017-04-01 DIAGNOSIS — Z951 Presence of aortocoronary bypass graft: Secondary | ICD-10-CM

## 2017-04-01 NOTE — Telephone Encounter (Signed)
Patient c/o Palpitations:  High priority if patient c/o lightheadedness and shortness of breath.  1. How long have you been having palpitations? Saturday.  2. Are you currently experiencing lightheadedness and shortness of breath?no  3. Have you checked your BP and heart rate? (document readings) 80/60   98-110  4. Are you experiencing any other symptoms? no

## 2017-04-01 NOTE — Telephone Encounter (Signed)
S/W Pt EC judy (ok with pt-verbally)he states that he was dx today at PCP with afib he states that he feels fatigued denies any any other sx, denies any CP or pressure, etc... States BP today at PCP was 80/60 HR 80 pt states that he is scared and fatigued. Appt made for 04-12-17 informed pt to call or go to ER if HR>100 (discussed with [place] to wait)

## 2017-04-01 NOTE — Telephone Encounter (Signed)
Currently has appt scheduled with Rosaria Ferries, PA on 04/19/17.

## 2017-04-01 NOTE — Telephone Encounter (Signed)
Received records from [facility] for appointment on 04/19/17 with Rosaria Ferries, PA.  Records put with Rhonda's schedule for 04/19/17. lp

## 2017-04-01 NOTE — Telephone Encounter (Signed)
Pt was admitted to [facility] hospital

## 2017-04-01 NOTE — Telephone Encounter (Signed)
Patient insurance is active and benefits verified. Patient has Medicare A/B - no co-payment, deductible $183.00/$183.00 has been met, 20% co-insurance, no out of pocket, no pre-authorization and no limit. Passport/reference [id number].  Patient will be contacted and scheduled after their follow up appointment with the cardiologist office on 04/19/17 and surgeon 04/29/17, upon review by [facility] RN navigator.

## 2017-04-01 NOTE — Telephone Encounter (Signed)
New Message  [name] verbalized that she is calling for rn   Have not received a call back from earlier today

## 2017-04-02 ENCOUNTER — Encounter (INDEPENDENT_AMBULATORY_CARE_PROVIDER_SITE_OTHER): Payer: Self-pay

## 2017-04-02 ENCOUNTER — Encounter (HOSPITAL_COMMUNITY): Payer: Self-pay | Admitting: Emergency Medicine

## 2017-04-02 ENCOUNTER — Telehealth: Payer: Self-pay | Admitting: Physician Assistant

## 2017-04-02 ENCOUNTER — Emergency Department (HOSPITAL_COMMUNITY): Payer: Medicare Other

## 2017-04-02 ENCOUNTER — Inpatient Hospital Stay (HOSPITAL_COMMUNITY)
Admission: EM | Admit: 2017-04-02 | Discharge: 2017-04-04 | DRG: 308 | Disposition: A | Discharge status: Home / Self Care | Payer: Medicare Other | Attending: Internal Medicine | Admitting: Internal Medicine

## 2017-04-02 ENCOUNTER — Other Ambulatory Visit: Payer: Self-pay

## 2017-04-02 DIAGNOSIS — I50813 Acute on chronic right heart failure: Secondary | ICD-10-CM

## 2017-04-02 DIAGNOSIS — F419 Anxiety disorder, unspecified: Secondary | ICD-10-CM | POA: Diagnosis present

## 2017-04-02 DIAGNOSIS — Z7982 Long term (current) use of aspirin: Secondary | ICD-10-CM

## 2017-04-02 DIAGNOSIS — E871 Hypo-osmolality and hyponatremia: Secondary | ICD-10-CM | POA: Diagnosis present

## 2017-04-02 DIAGNOSIS — I959 Hypotension, unspecified: Secondary | ICD-10-CM | POA: Diagnosis present

## 2017-04-02 DIAGNOSIS — I10 Essential (primary) hypertension: Secondary | ICD-10-CM | POA: Diagnosis present

## 2017-04-02 DIAGNOSIS — I4891 Unspecified atrial fibrillation: Principal | ICD-10-CM | POA: Diagnosis present

## 2017-04-02 DIAGNOSIS — L03115 Cellulitis of right lower limb: Secondary | ICD-10-CM | POA: Diagnosis present

## 2017-04-02 DIAGNOSIS — I5082 Biventricular heart failure: Secondary | ICD-10-CM | POA: Diagnosis present

## 2017-04-02 DIAGNOSIS — I110 Hypertensive heart disease with heart failure: Secondary | ICD-10-CM | POA: Diagnosis present

## 2017-04-02 DIAGNOSIS — I4510 Unspecified right bundle-branch block: Secondary | ICD-10-CM | POA: Diagnosis present

## 2017-04-02 DIAGNOSIS — I2510 Atherosclerotic heart disease of native coronary artery without angina pectoris: Secondary | ICD-10-CM | POA: Diagnosis not present

## 2017-04-02 DIAGNOSIS — Z7901 Long term (current) use of anticoagulants: Secondary | ICD-10-CM

## 2017-04-02 DIAGNOSIS — Z951 Presence of aortocoronary bypass graft: Secondary | ICD-10-CM

## 2017-04-02 DIAGNOSIS — Z8249 Family history of ischemic heart disease and other diseases of the circulatory system: Secondary | ICD-10-CM

## 2017-04-02 DIAGNOSIS — Z9119 Patient's noncompliance with other medical treatment and regimen: Secondary | ICD-10-CM

## 2017-04-02 DIAGNOSIS — Z6839 Body mass index (BMI) 39.0-39.9, adult: Secondary | ICD-10-CM | POA: Diagnosis not present

## 2017-04-02 DIAGNOSIS — I5033 Acute on chronic diastolic (congestive) heart failure: Secondary | ICD-10-CM | POA: Diagnosis not present

## 2017-04-02 DIAGNOSIS — I4892 Unspecified atrial flutter: Secondary | ICD-10-CM | POA: Diagnosis present

## 2017-04-02 DIAGNOSIS — Z7984 Long term (current) use of oral hypoglycemic drugs: Secondary | ICD-10-CM | POA: Diagnosis not present

## 2017-04-02 DIAGNOSIS — R000 Tachycardia, unspecified: Secondary | ICD-10-CM | POA: Diagnosis not present

## 2017-04-02 DIAGNOSIS — F1010 Alcohol abuse, uncomplicated: Secondary | ICD-10-CM | POA: Diagnosis present

## 2017-04-02 DIAGNOSIS — J90 Pleural effusion, not elsewhere classified: Secondary | ICD-10-CM | POA: Diagnosis not present

## 2017-04-02 DIAGNOSIS — I517 Cardiomegaly: Secondary | ICD-10-CM | POA: Diagnosis not present

## 2017-04-02 DIAGNOSIS — G4733 Obstructive sleep apnea (adult) (pediatric): Secondary | ICD-10-CM | POA: Diagnosis not present

## 2017-04-02 DIAGNOSIS — E119 Type 2 diabetes mellitus without complications: Secondary | ICD-10-CM

## 2017-04-02 DIAGNOSIS — I480 Paroxysmal atrial fibrillation: Secondary | ICD-10-CM

## 2017-04-02 DIAGNOSIS — N179 Acute kidney failure, unspecified: Secondary | ICD-10-CM | POA: Diagnosis present

## 2017-04-02 DIAGNOSIS — Z4802 Encounter for removal of sutures: Secondary | ICD-10-CM

## 2017-04-02 DIAGNOSIS — D62 Acute posthemorrhagic anemia: Secondary | ICD-10-CM | POA: Diagnosis present

## 2017-04-02 LAB — BASIC METABOLIC PANEL
ANION GAP: 9 (ref 5–15)
BUN: 35 mg/dL — ABNORMAL HIGH (ref 6–20)
CHLORIDE: 86 mmol/L — AB (ref 101–111)
CO2: 30 mmol/L (ref 22–32)
CREATININE: 1.21 mg/dL (ref 0.61–1.24)
Calcium: 8.8 mg/dL — ABNORMAL LOW (ref 8.9–10.3)
GFR calc non Af Amer: 60 mL/min — ABNORMAL LOW (ref >60)
Glucose, Bld: 149 mg/dL — ABNORMAL HIGH (ref 65–99)
POTASSIUM: 3.7 mmol/L (ref 3.5–5.1)
SODIUM: 125 mmol/L — AB (ref 135–145)

## 2017-04-02 LAB — CBC
HEMATOCRIT: 30.5 % — AB (ref 39.0–52.0)
HEMOGLOBIN: 10.6 g/dL — AB (ref 13.0–17.0)
MCH: 31 pg (ref 26.0–34.0)
MCHC: 34.8 g/dL (ref 30.0–36.0)
MCV: 89.2 fL (ref 78.0–100.0)
Platelets: 452 10*3/uL — ABNORMAL HIGH (ref 150–400)
RBC: 3.42 MIL/uL — AB (ref 4.22–5.81)
RDW: 11.7 % (ref 11.5–15.5)
WBC: 12.4 10*3/uL — AB (ref 4.0–10.5)

## 2017-04-02 LAB — TROPONIN I
TROPONIN I: 0.14 ng/mL — AB (ref <0.03)
Troponin I: 0.15 ng/mL (ref <0.03)

## 2017-04-02 LAB — I-STAT CG4 LACTIC ACID, ED: Lactic Acid, Venous: 1.08 mmol/L (ref 0.5–1.9)

## 2017-04-02 LAB — GLUCOSE, CAPILLARY: GLUCOSE-CAPILLARY: 149 mg/dL — AB (ref 65–99)

## 2017-04-02 MED ORDER — DIPHENHYDRAMINE HCL 25 MG PO CAPS
50.0000 mg | ORAL_CAPSULE | Freq: Once | ORAL | Status: AC
  Administered 2017-04-02: 50 mg via ORAL
  Filled 2017-04-02: qty 2

## 2017-04-02 MED ORDER — DILTIAZEM LOAD VIA INFUSION
20.0000 mg | Freq: Once | INTRAVENOUS | Status: AC
  Administered 2017-04-02: 20 mg via INTRAVENOUS
  Filled 2017-04-02: qty 20

## 2017-04-02 MED ORDER — HYDRALAZINE HCL 20 MG/ML IJ SOLN: 10.0000 mg | Freq: Three times a day (TID) | INTRAMUSCULAR | Status: DC | PRN

## 2017-04-02 MED ORDER — DIPHENHYDRAMINE HCL 25 MG PO CAPS: 25.0000 mg | ORAL_CAPSULE | Freq: Once | ORAL | Status: DC

## 2017-04-02 MED ORDER — ADULT MULTIVITAMIN W/MINERALS CH
1.0000 | ORAL_TABLET | Freq: Every day | ORAL | Status: DC
  Administered 2017-04-03 – 2017-04-04 (×2): 1 via ORAL
  Filled 2017-04-02 (×2): qty 1

## 2017-04-02 MED ORDER — POTASSIUM CHLORIDE CRYS ER 20 MEQ PO TBCR
40.0000 meq | EXTENDED_RELEASE_TABLET | Freq: Every day | ORAL | Status: DC
  Administered 2017-04-02 – 2017-04-04 (×3): 40 meq via ORAL
  Filled 2017-04-02 (×3): qty 2

## 2017-04-02 MED ORDER — ASPIRIN EC 325 MG PO TBEC: 325.0000 mg | DELAYED_RELEASE_TABLET | Freq: Every day | ORAL | Status: DC

## 2017-04-02 MED ORDER — ONDANSETRON HCL 4 MG PO TABS: 4.0000 mg | ORAL_TABLET | Freq: Four times a day (QID) | ORAL | Status: DC | PRN

## 2017-04-02 MED ORDER — SODIUM CHLORIDE 0.9 % IV BOLUS (SEPSIS)
1000.0000 mL | Freq: Once | INTRAVENOUS | Status: AC
  Administered 2017-04-02: 1000 mL via INTRAVENOUS

## 2017-04-02 MED ORDER — FOLIC ACID 1 MG PO TABS
1.0000 mg | ORAL_TABLET | Freq: Every day | ORAL | Status: DC
  Administered 2017-04-03 – 2017-04-04 (×2): 1 mg via ORAL
  Filled 2017-04-02 (×2): qty 1

## 2017-04-02 MED ORDER — METOPROLOL SUCCINATE ER 25 MG PO TB24
25.0000 mg | ORAL_TABLET | Freq: Every day | ORAL | Status: DC
  Administered 2017-04-03: 25 mg via ORAL
  Filled 2017-04-02: qty 1

## 2017-04-02 MED ORDER — LORAZEPAM 2 MG/ML IJ SOLN: 2.0000 mg | INTRAMUSCULAR | Status: DC | PRN

## 2017-04-02 MED ORDER — FUROSEMIDE 20 MG PO TABS
40.0000 mg | ORAL_TABLET | Freq: Every day | ORAL | Status: DC
  Administered 2017-04-03 – 2017-04-04 (×2): 40 mg via ORAL
  Filled 2017-04-02 (×2): qty 2

## 2017-04-02 MED ORDER — METOPROLOL SUCCINATE ER 25 MG PO TB24
25.0000 mg | ORAL_TABLET | Freq: Every day | ORAL | Status: DC
Start: 2017-04-02 — End: 2017-04-02

## 2017-04-02 MED ORDER — BRIMONIDINE TARTRATE 0.2 % OP SOLN
1.0000 [drp] | Freq: Two times a day (BID) | OPHTHALMIC | Status: DC
  Administered 2017-04-03: 1 [drp] via OPHTHALMIC
  Filled 2017-04-02: qty 5

## 2017-04-02 MED ORDER — TRAMADOL HCL 50 MG PO TABS
25.0000 mg | ORAL_TABLET | Freq: Once | ORAL | Status: AC
  Administered 2017-04-02: 25 mg via ORAL
  Filled 2017-04-02: qty 1

## 2017-04-02 MED ORDER — METOLAZONE 5 MG PO TABS: 5.0000 mg | ORAL_TABLET | ORAL | Status: DC

## 2017-04-02 MED ORDER — AMIODARONE HCL 200 MG PO TABS
400.0000 mg | ORAL_TABLET | Freq: Two times a day (BID) | ORAL | Status: DC
  Administered 2017-04-02 – 2017-04-04 (×4): 400 mg via ORAL
  Filled 2017-04-02 (×4): qty 2

## 2017-04-02 MED ORDER — ATORVASTATIN CALCIUM 80 MG PO TABS
80.0000 mg | ORAL_TABLET | Freq: Every day | ORAL | Status: DC
  Administered 2017-04-02 – 2017-04-03 (×2): 80 mg via ORAL
  Filled 2017-04-02 (×2): qty 1

## 2017-04-02 MED ORDER — VITAMIN B-1 100 MG PO TABS
100.0000 mg | ORAL_TABLET | Freq: Every day | ORAL | Status: DC
  Administered 2017-04-03 – 2017-04-04 (×2): 100 mg via ORAL
  Filled 2017-04-02 (×2): qty 1

## 2017-04-02 MED ORDER — IRBESARTAN 300 MG PO TABS
300.0000 mg | ORAL_TABLET | Freq: Every day | ORAL | Status: DC
  Administered 2017-04-03: 300 mg via ORAL
  Filled 2017-04-02: qty 2
  Filled 2017-04-02 (×2): qty 1

## 2017-04-02 MED ORDER — RIVAROXABAN 20 MG PO TABS
20.0000 mg | ORAL_TABLET | Freq: Every day | ORAL | Status: DC
  Administered 2017-04-03: 20 mg via ORAL
  Filled 2017-04-02: qty 1

## 2017-04-02 MED ORDER — ONDANSETRON HCL 4 MG/2ML IJ SOLN: 4.0000 mg | Freq: Four times a day (QID) | INTRAMUSCULAR | Status: DC | PRN

## 2017-04-02 MED ORDER — TIMOLOL MALEATE 0.5 % OP SOLN
1.0000 [drp] | Freq: Two times a day (BID) | OPHTHALMIC | Status: DC
  Administered 2017-04-03: 1 [drp] via OPHTHALMIC
  Filled 2017-04-02: qty 5

## 2017-04-02 MED ORDER — POLYETHYLENE GLYCOL 3350 17 G PO PACK
17.0000 g | PACK | Freq: Every day | ORAL | Status: DC | PRN
  Administered 2017-04-02 – 2017-04-03 (×2): 17 g via ORAL
  Filled 2017-04-02 (×2): qty 1

## 2017-04-02 MED ORDER — AMIODARONE HCL 200 MG PO TABS: 200.0000 mg | ORAL_TABLET | Freq: Two times a day (BID) | ORAL | Status: DC

## 2017-04-02 MED ORDER — FOLIC ACID 1 MG PO TABS: 1.0000 mg | ORAL_TABLET | Freq: Every day | ORAL | Status: DC

## 2017-04-02 MED ORDER — METOPROLOL TARTRATE 5 MG/5ML IV SOLN
5.0000 mg | Freq: Once | INTRAVENOUS | Status: AC
  Administered 2017-04-02: 5 mg via INTRAVENOUS
  Filled 2017-04-02: qty 5

## 2017-04-02 MED ORDER — LORAZEPAM 2 MG/ML IJ SOLN
0.5000 mg | Freq: Once | INTRAMUSCULAR | Status: AC
  Administered 2017-04-02: 0.5 mg via INTRAVENOUS
  Filled 2017-04-02: qty 1

## 2017-04-02 MED ORDER — INSULIN ASPART 100 UNIT/ML ~~LOC~~ SOLN
0.0000 [IU] | Freq: Three times a day (TID) | SUBCUTANEOUS | Status: DC
  Administered 2017-04-03 (×2): 1 [IU] via SUBCUTANEOUS
  Administered 2017-04-03: 2 [IU] via SUBCUTANEOUS
  Administered 2017-04-04: 1 [IU] via SUBCUTANEOUS

## 2017-04-02 MED ORDER — DILTIAZEM HCL 100 MG IV SOLR
5.0000 mg/h | INTRAVENOUS | Status: DC
  Administered 2017-04-02: 10 mg/h via INTRAVENOUS
  Administered 2017-04-02: 5 mg/h via INTRAVENOUS
  Filled 2017-04-02 (×2): qty 100

## 2017-04-02 MED ORDER — BRIMONIDINE TARTRATE-TIMOLOL 0.2-0.5 % OP SOLN
1.0000 [drp] | Freq: Two times a day (BID) | OPHTHALMIC | Status: DC
  Filled 2017-04-02: qty 5

## 2017-04-02 NOTE — ED Provider Notes (Signed)
[facility] DEPT Provider Note   CSN: [id number] Arrival date & time: 04/02/17  1136     History   Chief Complaint Chief Complaint  Patient presents with  . Atrial Fibrillation    HPI David Frost is a 66 y.o. male.  Pt presents to the ED today with rapid heart rate.  The pt was diagnosed with a NSTEMI (trop as high as 3.55) on June 25 in [place].  He was eventually transferred to [facility] and had a CABG on 7/2 by Dr. Servando Snare.  The pt initially did well post op.  He saw his pcp yesterday and was diagnosed with a.fib with RVR.  Pt started on Xarelto and he attempted to get an appt with cardiology.  They told him to come to the ED.  The pt did go to Dr. Everrett Coombe office this morning to get stitches removed from his right thigh.  There, his BP was 74.  They told him to go to the ED.  Pt has no prior hx of a.fib.  Chadvasc:  4 (age, htn, vascular disease, dm)         Past Medical History:  Diagnosis Date  . Anxiety   . Coronary artery disease   . Diabetes mellitus without complication ([place])    type 2  . Dietary indiscretion   . Dyspnea   . GERD (gastroesophageal reflux disease)   . Hypertension   . Obesity   . Obstructive sleep apnea   . Right bundle branch block     Patient Active Problem List   Diagnosis Date Noted  . DM2 (diabetes mellitus, type 2) ([place]) 03/17/2017  . CAD, multiple vessel 03/17/2017  . Non-ST elevation (NSTEMI) myocardial infarction ([place]) 03/17/2017  . Anxiety 03/17/2017  . Alcohol abuse 03/17/2017  . NSTEMI (non-ST elevated myocardial infarction) ([place]) 03/17/2017  . Essential hypertension 06/14/2014  . Morbid obesity ([place]) 06/14/2014  . Dietary indiscretion 06/14/2014  . Obstructive sleep apnea 06/14/2014  . Right bundle branch block 06/14/2014  . OBESITY, UNSPECIFIED 10/17/2009    Past Surgical History:  Procedure Laterality Date  . CORONARY ARTERY BYPASS GRAFT N/A 03/22/2017   Procedure: CORONARY ARTERY BYPASS GRAFTING  (CABG) x6, using the left internal mammary artery and the greater saphenous vein harvested endoscopically from the right thigh and calf and the left thigh. -LIMA to LAD -SVG to RAMUS INTERMEDIATE -SEQ SVG to OM1 and DISTAL LEFT CIRCUMFLEX -SEQ SVG to PDA and PLVB;  Surgeon: [name], MD;  Location: [facility];  Service: Open Heart Surgery;  Laterality  . DOPPLER ECHOCARDIOGRAPHY  2010  . NM MYOVIEW LTD  2007  . TEE WITHOUT CARDIOVERSION N/A 03/22/2017   Procedure: TRANSESOPHAGEAL ECHOCARDIOGRAM (TEE);  Surgeon: [name], MD;  Location: [facility];  Service: Open Heart Surgery;  Laterality: N/A;  . TONSILLECTOMY         Home Medications    Prior to Admission medications   Medication Sig Start Date End Date Taking? Authorizing Provider  acetaminophen (TYLENOL) 325 MG tablet Take 2 tablets (650 mg total) by mouth every 6 (six) hours as needed for mild pain. 03/27/17  Yes Barrett, Erin R, PA-C  ALPRAZolam (XANAX) 0.25 MG tablet Take 1 tablet (0.25 mg total) by mouth 2 (two) times daily as needed for anxiety. 03/27/17  Yes Barrett, Lodema Hong, PA-C  aspirin EC 325 MG EC tablet Take 1 tablet (325 mg total) by mouth daily. 03/27/17  Yes Barrett, Erin R, PA-C  atorvastatin (LIPITOR) 80 MG  tablet Take 1 tablet (80 mg total) by mouth daily at 6 PM. 03/27/17  Yes Barrett, Erin R, PA-C  brimonidine-timolol (COMBIGAN) 0.2-0.5 % ophthalmic solution Place 1 drop into both eyes 2 (two) times daily.    Yes [provider]  Canagliflozin (INVOKANA) 300 MG TABS Take 300 mg by mouth daily.   Yes [provider]  folic acid (FOLVITE) 1 MG tablet Take 1 tablet (1 mg total) by mouth daily. 03/27/17  Yes Barrett, Erin R, PA-C  furosemide (LASIX) 40 MG tablet Take 1 tablet (40 mg total) by mouth daily. 03/27/17 03/27/18 Yes Barrett, Erin R, PA-C  metolazone (ZAROXOLYN) 5 MG tablet TAKE 1 TABLET BY MOUTH DAILY 03/28/17  Yes Prescott Gum, Collier Salina, MD  metoprolol succinate (TOPROL-XL) 25 MG 24 hr tablet Take 25 mg by  mouth daily. 03/12/17  Yes [provider]  Multiple Vitamin (MULTIVITAMIN WITH MINERALS) TABS tablet Take 1 tablet by mouth daily. 03/27/17  Yes Barrett, Erin R, PA-C  potassium chloride SA (K-DUR,KLOR-CON) 20 MEQ tablet TAKE 2 TABLETS BY MOUTH DAILY 03/28/17  Yes Ivin Poot, MD  thiamine 100 MG tablet Take 1 tablet (100 mg total) by mouth daily. 03/27/17  Yes Barrett, Erin R, PA-C  traMADol (ULTRAM) 50 MG tablet Take 1-2 tablets (50-100 mg total) by mouth every 4 (four) hours as needed for moderate pain. 03/27/17  Yes Barrett, Erin R, PA-C  valsartan (DIOVAN) 320 MG tablet Take 1 tablet (320 mg total) by mouth daily. 03/27/17 03/27/18 Yes Barrett, Erin R, PA-C  XARELTO 20 MG TABS tablet Take 1 tablet by mouth daily. 04/01/17  Yes [provider]    Family History Family History  Problem Relation Age of Onset  . Heart attack Father     Social History Social History  Substance Use Topics  . Smoking status: Former Smoker    Years: 2.00    Types: Cigars    Quit date: 03/08/2017  . Smokeless tobacco: Never Used  . Alcohol use No     Comment: pt states not drinking since CABG     Allergies   No known allergies   Review of Systems Review of Systems  Constitutional: Positive for fatigue.  Respiratory: Positive for shortness of breath.   All other systems reviewed and are negative.    Physical Exam Updated Vital Signs BP 102/81   Pulse (!) 132   Temp 97.8 F (36.6 C) (Oral)   Resp 20   Ht 6\' 2"  (1.88 m)   Wt (!) 139.3 kg (307 lb)   SpO2 96%   BMI 39.42 kg/m   Physical Exam  Constitutional: He is oriented to person, place, and time. He appears well-developed and well-nourished.  HENT:  Head: Normocephalic and atraumatic.  Right Ear: External ear normal.  Left Ear: External ear normal.  Nose: Nose normal.  Mouth/Throat: Oropharynx is clear and moist.  Eyes: Pupils are equal, round, and reactive to light. Conjunctivae and EOM are normal.  Neck: Normal  range of motion. Neck supple.  Cardiovascular: An irregularly irregular rhythm present. Tachycardia present.   Pulmonary/Chest: Effort normal and breath sounds normal.  Abdominal: Soft. Bowel sounds are normal.  Musculoskeletal: He exhibits edema.  Neurological: He is alert and oriented to person, place, and time.  Skin: Skin is warm.     Psychiatric: He has a normal mood and affect. His behavior is normal. Judgment and thought content normal.  Nursing note and vitals reviewed.    ED Treatments / Results  Labs (all labs ordered are listed, but only abnormal results are displayed) Labs Reviewed  BASIC METABOLIC PANEL - Abnormal; Notable for the following:       Result Value   Sodium 125 (*)    Chloride 86 (*)    Glucose, Bld 149 (*)    BUN 35 (*)    Calcium 8.8 (*)    GFR calc non Af Amer 60 (*)    All other components within normal limits  CBC - Abnormal; Notable for the following:    WBC 12.4 (*)    RBC 3.42 (*)    Hemoglobin 10.6 (*)    HCT 30.5 (*)    Platelets 452 (*)    All other components within normal limits  TROPONIN I - Abnormal; Notable for the following:    Troponin I 0.15 (*)    All other components within normal limits  CULTURE, BLOOD (ROUTINE X 2)  CULTURE, BLOOD (ROUTINE X 2)    EKG  EKG Interpretation  Date/Time:  Friday April 02 2017 11:43:29 EDT Ventricular Rate:  132 PR Interval:  56 QRS Duration: 158 QT Interval:  382 QTC Calculation: 565 R Axis:   -84 Text Interpretation:  Sinus tachycardia with short PR Left axis deviation Right bundle branch block Abnormal ECG rate is faster, but otherwise no sig change. Confirmed by Isla Pence [phone]) on 04/02/2017 12:04:19 PM       Radiology Dg Chest Portable 1 View  Result Date: 04/02/2017 CLINICAL DATA:  Atrial fibrillation.  Hypertension. EXAM: PORTABLE CHEST 1 VIEW COMPARISON:  March 25, 2017 FINDINGS: There is a small left pleural effusion with mild left base atelectasis. Lungs elsewhere are  clear. There is cardiomegaly, stable. The pulmonary vascularity is normal. No adenopathy. Patient is status post coronary artery bypass grafting. No evident bone lesions. IMPRESSION: Stable cardiomegaly. Small left pleural effusion with mild left base atelectasis. Lungs elsewhere clear. Electronically Signed   By: Lowella Grip III M.D.   On: 04/02/2017 12:19    Procedures Procedures (including critical care time)  Medications Ordered in ED Medications  diltiazem (CARDIZEM) 1 mg/mL load via infusion 20 mg (20 mg Intravenous Bolus from Bag 04/02/17 1237)    And  diltiazem (CARDIZEM) 100 mg in dextrose 5 % 100 mL (1 mg/mL) infusion (10 mg/hr Intravenous Rate/Dose Change 04/02/17 1410)  sodium chloride 0.9 % bolus 1,000 mL (1,000 mLs Intravenous New Bag/Given 04/02/17 1214)  metoprolol tartrate (LOPRESSOR) injection 5 mg (5 mg Intravenous Given 04/02/17 1215)  LORazepam (ATIVAN) injection 0.5 mg (0.5 mg Intravenous Given 04/02/17 1408)     Initial Impression / Assessment and Plan / ED Course  I have reviewed the triage vital signs and the nursing notes.  Pertinent labs & imaging results that were available during my care of the patient were reviewed by me and considered in my medical decision making (see chart for details).    CRITICAL CARE Performed by: Isla Pence   Total critical care time: 30 minutes  Critical care time was exclusive of separately billable procedures and treating other patients.  Critical care was necessary to treat or prevent imminent or life-threatening deterioration.  Critical care was time spent personally by me on the following activities: development of treatment plan with patient and/or surrogate as well as nursing, discussions with consultants, evaluation of patient's response to treatment, examination of patient, obtaining history from patient or surrogate, ordering and performing treatments and interventions, ordering and review of laboratory studies,  ordering and review of radiographic  studies, pulse oximetry and re-evaluation of patient's condition.  HR improved with 20 mg of cardizem and 5 mg of lopressor IV and on a drip.  BP is back to normal with HR control.  Pt looks much more comfortable.  I added on blood cultures due to the hypotension and the mild cellulitis of right thigh.  Troponin is elevated, but down from it's peak of 3.55.  Pt d/w cardiology who will come to see.  I spoke with the cardiologist and they wanted pt to be admitted by CTS as he is 1 week post op.  The PA for cardiology called CTS and they did not want to admit, so she called [facility] who will.  Cardiology (Dr. Marlou Porch) did come to the ED for consult.  Final Clinical Impressions(s) / ED Diagnoses   Final diagnoses:  Atrial fibrillation with RVR [facility])    New Prescriptions New Prescriptions   No medications on file     Isla Pence, MD 04/02/17 1529

## 2017-04-02 NOTE — Progress Notes (Addendum)
Stopped cardizem drip due to soft BP. Paged MD. New order for EKG and amiodarone. EKG showed NSR w/right BBB. Paged MD with results. Continue to monitor.

## 2017-04-02 NOTE — Telephone Encounter (Signed)
REVIEWED WITH DR [place] PATIENT TO GO TO HOSPITAL ER FOR EVALUATION  NOTIFIED [place].

## 2017-04-02 NOTE — ED Triage Notes (Signed)
Pt reports being sent by surgeon for routine appt, sent for new Afib with hypotension. Pt denies dizziness, LOC, pain. Pt reports CABG 03/22/17. Resp e/u. HR 138.

## 2017-04-02 NOTE — ED Notes (Signed)
Attempted report and secretary initially advised the charge nurse had not assigned a bed and then the bed assignment was changed. Secretary advised the nurse would contact me back.

## 2017-04-02 NOTE — Telephone Encounter (Signed)
New message    David Frost is calling for pt asking if he can be worked in today because he is having afib. She states since Saturday. She said he just had bipass surgery a few weeks ago.

## 2017-04-02 NOTE — H&P (Signed)
[facility] History and Physical  David Frost CVE:[id number] DOB: Sep 27, 1950 DOA: 04/02/2017  Referring physician:  PCP: [name], MD   Chief Complaint: "My doctor told me to come."  HPI: David Frost is a 66 y.o. male patient recently had a 6 vessel CABG at [facility]. Saw his primary care doctor diagnosed with atrial fibrillation with RVR and started on Xarelto. Patient then went saw Dr. Servando Snare this morning for removal of stitches and was sent to the emergency room due to low systolic blood pressure. Patient states no prior history of atrial fibrillation. Denies chest trauma. Has been compliant with his medications.     Review of Systems:  As per HPI otherwise 10 point review of systems negative.    Past Medical History:  Diagnosis Date  . Anxiety   . Coronary artery disease   . Diabetes mellitus without complication ([place])    type 2  . Dietary indiscretion   . Dyspnea   . GERD (gastroesophageal reflux disease)   . Hypertension   . Obesity   . Obstructive sleep apnea   . Right bundle branch block    Past Surgical History:  Procedure Laterality Date  . CORONARY ARTERY BYPASS GRAFT N/A 03/22/2017   Procedure: CORONARY ARTERY BYPASS GRAFTING (CABG) x6, using the left internal mammary artery and the greater saphenous vein harvested endoscopically from the right thigh and calf and the left thigh. -LIMA to LAD -SVG to RAMUS INTERMEDIATE -SEQ SVG to OM1 and DISTAL LEFT CIRCUMFLEX -SEQ SVG to PDA and PLVB;  Surgeon: [name], MD;  Location: [facility];  Service: Open Heart Surgery;  Laterality  . DOPPLER ECHOCARDIOGRAPHY  2010  . NM MYOVIEW LTD  2007  . TEE WITHOUT CARDIOVERSION N/A 03/22/2017   Procedure: TRANSESOPHAGEAL ECHOCARDIOGRAM (TEE);  Surgeon: [name], MD;  Location: [facility];  Service: Open Heart Surgery;  Laterality: N/A;  . TONSILLECTOMY     Social History:  reports that he quit smoking about 3 weeks ago. His smoking use included  Cigars. He quit after 2.00 years of use. He has never used smokeless tobacco. He reports that he does not drink alcohol or use drugs.  Allergies  Allergen Reactions  . No Known Allergies     Family History  Problem Relation Age of Onset  . Heart attack Father      Prior to Admission medications   Medication Sig Start Date End Date Taking? Authorizing Provider  acetaminophen (TYLENOL) 325 MG tablet Take 2 tablets (650 mg total) by mouth every 6 (six) hours as needed for mild pain. 03/27/17  Yes Barrett, Erin R, PA-C  ALPRAZolam (XANAX) 0.25 MG tablet Take 1 tablet (0.25 mg total) by mouth 2 (two) times daily as needed for anxiety. 03/27/17  Yes Barrett, Lodema Hong, PA-C  aspirin EC 325 MG EC tablet Take 1 tablet (325 mg total) by mouth daily. 03/27/17  Yes Barrett, Erin R, PA-C  atorvastatin (LIPITOR) 80 MG tablet Take 1 tablet (80 mg total) by mouth daily at 6 PM. 03/27/17  Yes Barrett, Erin R, PA-C  brimonidine-timolol (COMBIGAN) 0.2-0.5 % ophthalmic solution Place 1 drop into both eyes 2 (two) times daily.    Yes [provider]  Canagliflozin (INVOKANA) 300 MG TABS Take 300 mg by mouth daily.   Yes [provider]  folic acid (FOLVITE) 1 MG tablet Take 1 tablet (1 mg total) by mouth daily. 03/27/17  Yes Barrett, Erin R, PA-C  furosemide (LASIX) 40 MG tablet  Take 1 tablet (40 mg total) by mouth daily. 03/27/17 03/27/18 Yes Barrett, Erin R, PA-C  metolazone (ZAROXOLYN) 5 MG tablet TAKE 1 TABLET BY MOUTH DAILY 03/28/17  Yes Prescott Gum, Collier Salina, MD  metoprolol succinate (TOPROL-XL) 25 MG 24 hr tablet Take 25 mg by mouth daily. 03/12/17  Yes [provider]  Multiple Vitamin (MULTIVITAMIN WITH MINERALS) TABS tablet Take 1 tablet by mouth daily. 03/27/17  Yes Barrett, Erin R, PA-C  potassium chloride SA (K-DUR,KLOR-CON) 20 MEQ tablet TAKE 2 TABLETS BY MOUTH DAILY 03/28/17  Yes Ivin Poot, MD  thiamine 100 MG tablet Take 1 tablet (100 mg total) by mouth daily. 03/27/17  Yes Barrett, Erin R,  PA-C  traMADol (ULTRAM) 50 MG tablet Take 1-2 tablets (50-100 mg total) by mouth every 4 (four) hours as needed for moderate pain. 03/27/17  Yes Barrett, Erin R, PA-C  valsartan (DIOVAN) 320 MG tablet Take 1 tablet (320 mg total) by mouth daily. 03/27/17 03/27/18 Yes Barrett, Erin R, PA-C  XARELTO 20 MG TABS tablet Take 1 tablet by mouth daily. 04/01/17  Yes [provider]   Physical Exam: Vitals:   04/02/17 1445 04/02/17 1500 04/02/17 1535 04/02/17 1600  BP: 105/84 102/81 107/89 96/61  Pulse: (!) 112 (!) 132 (!) 115 (!) 44  Resp: (!) 22 20 20 20   Temp:      TempSrc:      SpO2: 96% 96%  95%  Weight:      Height:        Wt Readings from Last 3 Encounters:  04/02/17 (!) 139.3 kg (307 lb)  03/27/17 (!) 140.8 kg (310 lb 4.8 oz)  06/14/14 135.6 kg (299 lb)    General:  Appears calm and comfortable; alert and oriented 3 Eyes:  PERRL, EOMI, normal lids, iris ENT:  grossly normal hearing, lips & tongue Neck:  no LAD, masses or thyromegaly Cardiovascular:  irr IRR, no m/r/g. No LE edema.  Respiratory:  CTA bilaterally, no w/r/r. Normal respiratory effort. Abdomen:  soft, ntnd, umbilical hernia Skin:  no rash or induration seen on limited exam, right medial thigh healing surgical incision Musculoskeletal:  grossly normal tone BUE/BLE Psychiatric:  grossly normal mood and affect, speech fluent and appropriate Neurologic:  CN 2-12 grossly intact, moves all extremities in coordinated fashion.          Labs on Admission:  Basic Metabolic Panel:  Recent Labs Lab 03/27/17 0144 04/02/17 1158  NA 127* 125*  K 4.1 3.7  CL 87* 86*  CO2 32 30  GLUCOSE 121* 149*  BUN 18 35*  CREATININE 0.91 1.21  CALCIUM 8.9 8.8*   Liver Function Tests: No results for input(s): AST, ALT, ALKPHOS, BILITOT, PROT, ALBUMIN in the last 168 hours. No results for input(s): LIPASE, AMYLASE in the last 168 hours. No results for input(s): AMMONIA in the last 168 hours. CBC:  Recent Labs Lab  04/02/17 1158  WBC 12.4*  HGB 10.6*  HCT 30.5*  MCV 89.2  PLT 452*   Cardiac Enzymes:  Recent Labs Lab 04/02/17 1205  TROPONINI 0.15*    BNP (last 3 results) No results for input(s): BNP in the last 8760 hours.  ProBNP (last 3 results) No results for input(s): PROBNP in the last 8760 hours.   Serum creatinine: 1.21 mg/dL 04/02/17 1158 Estimated creatinine clearance: 86.8 mL/min  CBG:  Recent Labs Lab 03/26/17 2124 03/27/17 0606  GLUCAP 107* 113*    Radiological Exams on Admission: Dg Chest Portable 1 View  Result  Date: 04/02/2017 CLINICAL DATA:  Atrial fibrillation.  Hypertension. EXAM: PORTABLE CHEST 1 VIEW COMPARISON:  March 25, 2017 FINDINGS: There is a small left pleural effusion with mild left base atelectasis. Lungs elsewhere are clear. There is cardiomegaly, stable. The pulmonary vascularity is normal. No adenopathy. Patient is status post coronary artery bypass grafting. No evident bone lesions. IMPRESSION: Stable cardiomegaly. Small left pleural effusion with mild left base atelectasis. Lungs elsewhere clear. Electronically Signed   By: Lowella Grip III M.D.   On: 04/02/2017 12:19    EKG: Independently reviewed. Afib with RVR  Assessment/Plan Principal Problem:   Atrial fibrillation with RVR (H[facility]) Active Problems:   Essential hypertension   Obstructive sleep apnea   DM2 (diabetes mellitus, type 2) ([facility])   Alcohol abuse   Atrial fibrillation with RVR/low BP Serial troponin Check echo Check electrolytes Cardiology following Cardizem drip & primary mgmt per cardio, 1st call for issues Cont xarelto Mild troponemia  AKI Baseline Cr <1.0, Cr on admit 1.22 1000 of normal saline given in the emergency room Gentle hydration overnight Checking magnesium and phosphorus Consider imaging if no improvement in AM  Hypertension When necessary hydralazine 10 mg IV as needed for severe blood pressure  Hyponatremia No focal neurological  deficits Check serial sodium Neuro checks ordered Patient given 1 L of fluid in the emergency room Will hold Zaroxolyn Patient not in respiratory distress on my exam and chest x-ray clear, re-eval in AM Elevate head of bed Check protein creatinine ratio No known baseline, 132 on last admission Will treat more aggressively once patient is a few more hours out from his hypotension  CABG EDP and cardiology report that they spoke to CT surgery who declined admission of this patient and will see him in consult  OSA RT consult  DM SSI AC Hold invokana  Ocular dz Cont combigan  ETOH abuse CIWA  Elevated white blood cell count Checking UA Could just be post op white cell elevation  Heart failure Cont zaroxolyn, lasix, toprol-XL, diovan-->avapro  Code Status: full  DVT Prophylaxis: xarelto Family Communication: family at bedside Disposition Plan: Pending Improvement  Status: inpt tele  Elwin Mocha, MD Family Medicine [facility] [email or web address] Password [email or web address]

## 2017-04-02 NOTE — Consult Note (Signed)
Cardiology Consultation:   Patient ID: DAYRON ODLAND; [id number]; 01-Oct-1950   Admit date: 04/02/2017 Date of Consult: 04/02/2017  Primary Care Provider: [name], MD Primary Cardiologist: Dr. Quay Burow Cardiothoracic Surgery: Dr. Servando Snare   Patient Profile:   MCKINNLEY SMITHEY is a 66 y.o. male with a hx of s/p NSTEMI, multivessel CAD with CABG x 6 on 03/22/2017, diabetes, GERD, HTN, OSA, hyperlipidemia, RBBB, smoker, ETOH abuse, obesity .  The patient is being seen today for the evaluation of new onset atrial fibrillation at the request of Dr. Gilford Raid.  History of Present Illness:   KYRIAN STAGE has 6 graft bypass surgery done on 03/22/2017 after an NSTEMI. He overall had an uncomplicated course aside from fluid overload and hypertension.  He was discharged 03/26/2017. He saw his PCP yesterday and was diagnosed with atrial fibirlllation with RVR, started an Xarelto and asked to follow-up with the cardiologist. He was due for his first follow-up appointment with CTSurg today for suture removal. He was found to be hypotensive BP 74. HR in ED 138. Troponin elevated 0.15 in the ED (peak 3.55 on June 25 in [place] where he was diagnosed with NSTEMI).  His blood pressure improved with the rate control and is now normal at 107/189. The patient is now feeling much better. His right lower leg had sutures taken out today and reports swelling. He currently is feeling much improved.   Past Medical History:  Diagnosis Date  . Anxiety   . Coronary artery disease   . Diabetes mellitus without complication ([place])    type 2  . Dietary indiscretion   . Dyspnea   . GERD (gastroesophageal reflux disease)   . Hypertension   . Obesity   . Obstructive sleep apnea   . Right bundle branch block    Past Surgical History:  Procedure Laterality Date  . CORONARY ARTERY BYPASS GRAFT N/A 03/22/2017   Procedure: CORONARY ARTERY BYPASS GRAFTING (CABG) x6, using the left  internal mammary artery and the greater saphenous vein harvested endoscopically from the right thigh and calf and the left thigh. -LIMA to LAD -SVG to RAMUS INTERMEDIATE -SEQ SVG to OM1 and DISTAL LEFT CIRCUMFLEX -SEQ SVG to PDA and PLVB;  Surgeon: [name], MD;  Location: [facility];  Service: Open Heart Surgery;  Laterality  . DOPPLER ECHOCARDIOGRAPHY  2010  . NM MYOVIEW LTD  2007  . TEE WITHOUT CARDIOVERSION N/A 03/22/2017   Procedure: TRANSESOPHAGEAL ECHOCARDIOGRAM (TEE);  Surgeon: [name], MD;  Location: [facility];  Service: Open Heart Surgery;  Laterality: N/A;  . TONSILLECTOMY       Inpatient Medications: Scheduled Meds:  Continuous Infusions: . diltiazem (CARDIZEM) infusion 10 mg/hr (04/02/17 1410)   PRN Meds:  Allergies:    Allergies  Allergen Reactions  . No Known Allergies     Social History:   Social History   Social History  . Marital status: Legally Separated    Spouse name: N/A  . Number of children: N/A  . Years of education: N/A   Occupational History  . Not on file.   Social History Main Topics  . Smoking status: Former Smoker    Years: 2.00    Types: Cigars    Quit date: 03/08/2017  . Smokeless tobacco: Never Used  . 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 Drug use: No  . Sexual activity:  Not on file   Other Topics Concern  . Not on file   Social History Narrative  . No narrative on file      Family History:   The patient's family history includes Heart attack in his father.  ROS:  Please see the history of present illness.  All other ROS reviewed and negative.     Physical Exam/Data:   Vitals:   04/02/17 1245 04/02/17 1300 04/02/17 1315 04/02/17 1330  BP: 97/74 120/77 113/76 100/84  Pulse: 67   95  Resp: (!) 22 (!) 24 18 16   Temp:      TempSrc:      SpO2: 97%   99%  Weight:      Height:       No intake or output data in the 24 hours ending 04/02/17 1413 Filed Weights   04/02/17 1148    Weight: (!) 307 lb (139.3 kg)   Body mass index is 39.42 kg/m.  General: Well developed, well nourished, in no acute distress.  Head: Normocephalic, atraumatic Neck: Negative for carotid bruits. JVD not elevated. Lungs: Clear bilaterally to auscultation without wheezes, rales, or rhonchi. Breathing is unlabored. Heart: An irregularly irregular rhythm, tachycardia present.  Surgical wound to chest wall, dry and clean. Abdomen: Soft, non-tender, large, protuberant abdomen. Msk:  Swelling to right upper extremity with associated erythema, surgical scar present, no purulent discharge associated. Extremities: No clubbing or cyanosis. No edema.  Neuro: Alert and oriented X 3. No facial asymmetry. Psych:  Responds to questions appropriately with a normal affect.  EKG:  The EKG was personally reviewed and demonstrates HR 138, RBBB, afib  Relevant CV Studies: Transthoracic Echocardiography 03/22/2017 Result status: Final result   Left ventricle: Normal cavity size and wall thickness. LV systolic function is low normal with an EF of 50-55%. There are no obvious wall motion abnormalities. No thrombus present. No mass present.  Aortic valve: Moderate valve thickening present. Mild valve calcification present. No stenosis. Trace regurgitation.  Septum: Lipomatous atrial septal hypertrophy present.     Cardiac Catheterization not done at [facility] but showed multivessel CAD. They were on a CD in the patients chart.   Laboratory Data:  Chemistry Recent Labs Lab 03/27/17 0144 04/02/17 1158  NA 127* 125*  K 4.1 3.7  CL 87* 86*  CO2 32 30  GLUCOSE 121* 149*  BUN 18 35*  CREATININE 0.91 1.21  CALCIUM 8.9 8.8*  GFRNONAA >60 60*  GFRAA >60 >60  ANIONGAP 8 9     Hematology Recent Labs Lab 04/02/17 1158  WBC 12.4*  RBC 3.42*  HGB 10.6*  HCT 30.5*  MCV 89.2  MCH 31.0  MCHC 34.8  RDW 11.7  PLT 452*   Cardiac Enzymes Recent Labs Lab 04/02/17 1205  TROPONINI 0.15*     Radiology/Studies:  Dg Chest Portable 1 View  Result Date: 04/02/2017 CLINICAL DATA:  Atrial fibrillation.  Hypertension. EXAM: PORTABLE CHEST 1 VIEW COMPARISON:  March 25, 2017 FINDINGS: There is a small left pleural effusion with mild left base atelectasis. Lungs elsewhere are clear. There is cardiomegaly, stable. The pulmonary vascularity is normal. No adenopathy. Patient is status post coronary artery bypass grafting. No evident bone lesions. IMPRESSION: Stable cardiomegaly. Small left pleural effusion with mild left base atelectasis. Lungs elsewhere clear. Electronically Signed   By: Lowella Grip III M.D.   On: 04/02/2017 12:19    Assessment and Plan:   1. Atrial fibrillation RVR  Chadvasc  4 (age, htn, vascular disease, dm):  New onset atrial fibrillation in the setting of recent CABG x 6 on 03/22/2017. He was started on Xarelto yesterday. He was given 20 mg Cardizem IV and drip. Per ER provider note he was given  5mg  of Lopressor IV as well as.  --  Will use Amiodarone 400 mg BID to hopefully help regulate the rhythm. --  Continue home dose  Of Toprol XL 25 mg daily --  Continue Cardizem drip titrate as necessary and will convert to PO when stable.  2. Hypotension: BP improved with a liter bolus and reduction of heart rate. He is afebrile, sternal wound looks clean dry and intact. Leg wound shows some erythema with swelling. Will defer to Cardiothoracic surgery, this may be normal per post operative course.  3.  Right leg erythema: See above. Erythema and swelling to R upper thigh. Will appreciate CTS input if this the expected course of post op healing.   4. Type II diabetes: IM to manage  5. OSA:  IM to manage  6. Smoker, ETOH abuse and morbidly: BMI is 39, recommend patient loose weight with a healthy diet, stop smoking and drinking alcohol. All of this lifestyle choices are risk factors for atrial fibrillation.  -- continue statin  Kristopher Glee, PA-C  04/02/2017  2:13 PM  Personally seen and examined. Agree with above.  66 year old male 1 week postop discharge from bypass surgery with postoperative atrial fibrillation, sent over from surgery clinic after suture removal with hypotension and atrial fibrillation with rapid ventricular response.    Current Meds  Medication Sig  . acetaminophen (TYLENOL) 325 MG tablet Take 2 tablets (650 mg total) by mouth every 6 (six) hours as needed for mild pain.  Marland Kitchen ALPRAZolam (XANAX) 0.25 MG tablet Take 1 tablet (0.25 mg total) by mouth 2 (two) times daily as needed for anxiety.  Marland Kitchen aspirin EC 325 MG EC tablet Take 1 tablet (325 mg total) by mouth daily.  Marland Kitchen atorvastatin (LIPITOR) 80 MG tablet Take 1 tablet (80 mg total) by mouth daily at 6 PM.  . brimonidine-timolol (COMBIGAN) 0.2-0.5 % ophthalmic solution Place 1 drop into both eyes 2 (two) times daily.   . Canagliflozin (INVOKANA) 300 MG TABS Take 300 mg by mouth daily.  . folic acid (FOLVITE) 1 MG tablet Take 1 tablet (1 mg total) by mouth daily.  . furosemide (LASIX) 40 MG tablet Take 1 tablet (40 mg total) by mouth daily.  . metolazone (ZAROXOLYN) 5 MG tablet TAKE 1 TABLET BY MOUTH DAILY  . metoprolol succinate (TOPROL-XL) 25 MG 24 hr tablet Take 25 mg by mouth daily.  . Multiple Vitamin (MULTIVITAMIN WITH MINERALS) TABS tablet Take 1 tablet by mouth daily.  . potassium chloride SA (K-DUR,KLOR-CON) 20 MEQ tablet TAKE 2 TABLETS BY MOUTH DAILY  . thiamine 100 MG tablet Take 1 tablet (100 mg total) by mouth daily.  . traMADol (ULTRAM) 50 MG tablet Take 1-2 tablets (50-100 mg total) by mouth every 4 (four) hours as needed for moderate pain.  . valsartan (DIOVAN) 320 MG tablet Take 1 tablet (320 mg total) by mouth daily.  Alveda Reasons 20 MG TABS tablet Take 1 tablet by mouth daily.   After 1 L of fluids and diltiazem IV, blood pressure has improved. He is not complaining of any chest discomfort. No significant shortness of breath.  Exam reveals irregularly irregular  heart rate, tachycardic, lungs with mild blunting at the bases, normal respiratory effort, obese abdomen, 2+ lower extremity edema with erythema noted surrounding  vein extraction site. Nonfocal, alert.  Atrial fibrillation with rapid ventricular response  - Continue with IV diltiazem with anticipation of conversion to by mouth diltiazem or perhaps converting to a higher dose of long-acting Toprol.  - Anticoagulation with Xarelto.  - Add amiodarone 400 mg by mouth twice a day. Anticipate hopeful short course of amiodarone.  - Try to augment risk factor such as obesity, alcohol use.  Coronary artery disease status post CABG  - Stable, no angina  Lower extremity wound at vein extraction site  - Erythematous, warm. Will defer to surgery for use of antibiotics. This possibly could be normal postoperative changes.  Hyponatremia  - Seems to be fairly chronic. Candee Furbish, MD

## 2017-04-02 NOTE — ED Notes (Signed)
Cardiology at bedside.

## 2017-04-03 DIAGNOSIS — I5033 Acute on chronic diastolic (congestive) heart failure: Secondary | ICD-10-CM

## 2017-04-03 DIAGNOSIS — I4891 Unspecified atrial fibrillation: Principal | ICD-10-CM

## 2017-04-03 DIAGNOSIS — I10 Essential (primary) hypertension: Secondary | ICD-10-CM

## 2017-04-03 DIAGNOSIS — G4733 Obstructive sleep apnea (adult) (pediatric): Secondary | ICD-10-CM

## 2017-04-03 DIAGNOSIS — I50813 Acute on chronic right heart failure: Secondary | ICD-10-CM

## 2017-04-03 LAB — BASIC METABOLIC PANEL
ANION GAP: 7 (ref 5–15)
Anion gap: 8 (ref 5–15)
BUN: 30 mg/dL — AB (ref 6–20)
BUN: 28 mg/dL — ABNORMAL HIGH (ref 6–20)
CHLORIDE: 88 mmol/L — AB (ref 101–111)
CHLORIDE: 89 mmol/L — AB (ref 101–111)
CO2: 31 mmol/L (ref 22–32)
CO2: 31 mmol/L (ref 22–32)
CREATININE: 1.02 mg/dL (ref 0.61–1.24)
Calcium: 8.6 mg/dL — ABNORMAL LOW (ref 8.9–10.3)
Calcium: 8.9 mg/dL (ref 8.9–10.3)
Creatinine, Ser: 1.06 mg/dL (ref 0.61–1.24)
GFR calc Af Amer: >60 mL/min (ref >60)
GFR calc non Af Amer: >60 mL/min (ref >60)
GLUCOSE: 122 mg/dL — AB (ref 65–99)
Glucose, Bld: 144 mg/dL — ABNORMAL HIGH (ref 65–99)
POTASSIUM: 4.3 mmol/L (ref 3.5–5.1)
POTASSIUM: 4.1 mmol/L (ref 3.5–5.1)
SODIUM: 128 mmol/L — AB (ref 135–145)
Sodium: 126 mmol/L — ABNORMAL LOW (ref 135–145)

## 2017-04-03 LAB — GLUCOSE, CAPILLARY
GLUCOSE-CAPILLARY: 182 mg/dL — AB (ref 65–99)
GLUCOSE-CAPILLARY: 123 mg/dL — AB (ref 65–99)
Glucose-Capillary: 145 mg/dL — ABNORMAL HIGH (ref 65–99)
Glucose-Capillary: 142 mg/dL — ABNORMAL HIGH (ref 65–99)

## 2017-04-03 LAB — URINALYSIS, ROUTINE W REFLEX MICROSCOPIC
BILIRUBIN URINE: NEGATIVE
Bacteria, UA: NONE SEEN
HGB URINE DIPSTICK: NEGATIVE
Ketones, ur: NEGATIVE mg/dL
LEUKOCYTES UA: NEGATIVE
NITRITE: NEGATIVE
PH: 5 (ref 5.0–8.0)
Protein, ur: 30 mg/dL — AB
SPECIFIC GRAVITY, URINE: 1.016 (ref 1.005–1.030)

## 2017-04-03 LAB — CBC
HCT: 29.4 % — ABNORMAL LOW (ref 39.0–52.0)
Hemoglobin: 10.2 g/dL — ABNORMAL LOW (ref 13.0–17.0)
MCH: 31 pg (ref 26.0–34.0)
MCHC: 34.7 g/dL (ref 30.0–36.0)
MCV: 89.4 fL (ref 78.0–100.0)
PLATELETS: 464 10*3/uL — AB (ref 150–400)
RBC: 3.29 MIL/uL — AB (ref 4.22–5.81)
RDW: 11.9 % (ref 11.5–15.5)
WBC: 11.8 10*3/uL — AB (ref 4.0–10.5)

## 2017-04-03 LAB — TROPONIN I
TROPONIN I: 0.12 ng/mL — AB (ref <0.03)
Troponin I: 0.12 ng/mL (ref <0.03)

## 2017-04-03 LAB — PROTEIN / CREATININE RATIO, URINE
Creatinine, Urine: 178.29 mg/dL
Protein Creatinine Ratio: 0.12 mg/mg{Cre} (ref 0.00–0.15)
Total Protein, Urine: 21 mg/dL

## 2017-04-03 MED ORDER — IRBESARTAN 150 MG PO TABS: 150.0000 mg | ORAL_TABLET | Freq: Every day | ORAL | Status: DC

## 2017-04-03 MED ORDER — PNEUMOCOCCAL VAC POLYVALENT 25 MCG/0.5ML IJ INJ
0.5000 mL | INJECTION | INTRAMUSCULAR | Status: DC
  Filled 2017-04-03: qty 0.5

## 2017-04-03 MED ORDER — ALPRAZOLAM 0.25 MG PO TABS
0.2500 mg | ORAL_TABLET | Freq: Two times a day (BID) | ORAL | Status: DC | PRN
  Administered 2017-04-03 – 2017-04-04 (×3): 0.25 mg via ORAL
  Filled 2017-04-03 (×3): qty 1

## 2017-04-03 MED ORDER — DIPHENHYDRAMINE HCL 25 MG PO CAPS
50.0000 mg | ORAL_CAPSULE | Freq: Once | ORAL | Status: AC
  Administered 2017-04-03: 50 mg via ORAL
  Filled 2017-04-03: qty 2

## 2017-04-03 MED ORDER — METOPROLOL SUCCINATE ER 50 MG PO TB24
50.0000 mg | ORAL_TABLET | Freq: Every day | ORAL | Status: DC
  Administered 2017-04-04: 50 mg via ORAL
  Filled 2017-04-03: qty 1

## 2017-04-03 MED ORDER — METOPROLOL SUCCINATE ER 50 MG PO TB24: 50.0000 mg | ORAL_TABLET | Freq: Every day | ORAL | Status: DC

## 2017-04-03 MED ORDER — METOPROLOL SUCCINATE ER 25 MG PO TB24
25.0000 mg | ORAL_TABLET | Freq: Once | ORAL | Status: AC
  Administered 2017-04-03: 25 mg via ORAL
  Filled 2017-04-03: qty 1

## 2017-04-03 MED ORDER — TRAMADOL HCL 50 MG PO TABS
25.0000 mg | ORAL_TABLET | Freq: Once | ORAL | Status: AC
  Administered 2017-04-03: 25 mg via ORAL
  Filled 2017-04-03: qty 1

## 2017-04-03 MED ORDER — ACETAMINOPHEN 325 MG PO TABS
650.0000 mg | ORAL_TABLET | Freq: Four times a day (QID) | ORAL | Status: DC | PRN
  Administered 2017-04-04: 650 mg via ORAL
  Filled 2017-04-03: qty 2

## 2017-04-03 MED ORDER — CEPHALEXIN 500 MG PO CAPS
500.0000 mg | ORAL_CAPSULE | Freq: Three times a day (TID) | ORAL | Status: DC
  Administered 2017-04-03 – 2017-04-04 (×4): 500 mg via ORAL
  Filled 2017-04-03 (×4): qty 1

## 2017-04-03 MED ORDER — FUROSEMIDE 10 MG/ML IJ SOLN
40.0000 mg | Freq: Once | INTRAMUSCULAR | Status: AC
  Administered 2017-04-03: 40 mg via INTRAVENOUS
  Filled 2017-04-03: qty 4

## 2017-04-03 NOTE — Progress Notes (Addendum)
      [address]       [place] [address]             [phone]            Subjective: Patient sitting in chair.  Objective: Vital signs in last 24 hours: Temp:  [97.6 F (36.4 C)-98.8 F (37.1 C)] 98.5 F (36.9 C) (07/14 0735) Pulse Rate:  [28-137] 54 (07/14 0735) Cardiac Rhythm: Atrial flutter;Bundle branch block (07/14 0810) Resp:  [14-24] 18 (07/14 0735) BP: (71-126)/(52-109) 116/62 (07/14 0735) SpO2:  [72 %-99 %] 96 % (07/14 0735) Weight:  [136.7 kg (301 lb 4.8 oz)-139.3 kg (307 lb)] 136.7 kg (301 lb 4.8 oz) (07/14 0448)   Current Weight  04/03/17 (!) 136.7 kg (301 lb 4.8 oz)      Intake/Output from previous day: 07/13 0701 - 07/14 0700 In: 240 [P.O.:240] Out: -    Physical Exam:  Cardiovascular: David Frost Pulmonary: Slightly diminished at bases. Abdomen: Soft, non tender, bowel sounds present. Extremities: ++ lower extremity edema. Wounds: Sternal wound is clean and dry.  No erythema or signs of infection. Right thigh wound has serous drainage and surrounding erythema  Lab Results: CBC: Recent Labs  04/02/17 1158 04/03/17 0636  WBC 12.4* 11.8*  HGB 10.6* 10.2*  HCT 30.5* 29.4*  PLT 452* 464*   BMET:  Recent Labs  04/03/17 0124 04/03/17 0636  NA 126* 128*  K 4.3 4.1  CL 88* 89*  CO2 31 31  GLUCOSE 122* 144*  BUN 30* 28*  CREATININE 1.06 1.02  CALCIUM 8.6* 8.9    PT/INR:  Lab Results  Component Value Date   INR 1.28 03/22/2017   INR 1.05 03/21/2017   ABG:  INR: Will add last result for INR, ABG once components are confirmed Will add last 4 CBG results once components are confirmed  Assessment/Plan:  1. CV - A fib.Toprol XL 25 mg daily and Amiodarone 400 mg bid. Management of a fib per cardiology. 2.  Pulmonary - On room air. Encourage incentive spirometer. 3. Volume Overload - On Lasix 40 mg daily 4.  Acute blood loss anemia - H and H stable at 10.2 and 29.4 5. Hyponatremia-sodium 128, likely related  to diuresis 6. Regarding right thigh wound, patient saw PCP and he gave him a prescription for Doxycycline. He filled it but has not taken it yet. As discussed with Dr. Servando Snare, start Keflex . Dressing changes daily and PRN.  ZIMMERMAN,DONIELLE MPA-C 04/03/2017,10:11 AM  remains in afib  On xarelto Mild eurhythmia right knee endo vein site, no drainage  Stared on keflex  I have seen and examined David Frost and agree with the above assessment  and plan.  [name] MD Beeper [phone] Office [phone] 04/03/2017 4:14 PM

## 2017-04-03 NOTE — Progress Notes (Signed)
Progress Note  Patient Name: David Frost Date of Encounter: 04/03/2017  Primary Cardiologist: Gwenlyn Found  Subjective   Anxious and frustrated that he can go home. Denies dyspnea at rest. Severe bilateral leg edema with some weeping from the right thigh saphenectomy site. Diltiazem was stopped due to severe hypotension. Blood pressure is better, but resting heart rate remains high at around 110 bpm.  Inpatient Medications    Scheduled Meds: . amiodarone  400 mg Oral BID  . atorvastatin  80 mg Oral q1800  . brimonidine  1 drop Both Eyes BID   And  . timolol  1 drop Both Eyes BID  . folic acid  1 mg Oral Daily  . furosemide  40 mg Intravenous Once  . furosemide  40 mg Oral Daily  . insulin aspart  0-9 Units Subcutaneous TID WC  . metoprolol succinate  50 mg Oral Daily  . multivitamin with minerals  1 tablet Oral Daily  . [START ON 04/04/2017] pneumococcal 23 valent vaccine  0.5 mL Intramuscular Tomorrow-1000  . potassium chloride SA  40 mEq Oral Daily  . rivaroxaban  20 mg Oral Q supper  . thiamine  100 mg Oral Daily   Continuous Infusions: . diltiazem (CARDIZEM) infusion Stopped (04/02/17 2130)   PRN Meds: hydrALAZINE, LORazepam, ondansetron **OR** ondansetron (ZOFRAN) IV, polyethylene glycol   Vital Signs    Vitals:   04/03/17 0425 04/03/17 0448 04/03/17 0523 04/03/17 0735  BP: (!) 88/58 126/83 109/71 116/62  Pulse: 89 99 78 (!) 54  Resp: (!) 22 (!) 23 20 18   Temp:  98.7 F (37.1 C)  98.5 F (36.9 C)  TempSrc:  Oral  Oral  SpO2: 96% 96% 97% 96%  Weight:  (!) 301 lb 4.8 oz (136.7 kg)    Height:        Intake/Output Summary (Last 24 hours) at 04/03/17 0914 Last data filed at 04/03/17 0500  Gross per 24 hour  Intake              240 ml  Output                0 ml  Net              240 ml   Filed Weights   04/02/17 1148 04/02/17 1834 04/03/17 0448  Weight: (!) 307 lb (139.3 kg) (!) 305 lb 1.6 oz (138.4 kg) (!) 301 lb 4.8 oz (136.7 kg)    Telemetry      Atrial fibrillation with rapid ventricular rate - Personally Reviewed  ECG    Atrial flutter with 2:1 AV block earlier in the day subsequent EKG with 4:1 AV block, right bundle branch block - Personally Reviewed  Physical Exam  Morbidly obese GEN: No acute distress.   Neck: No JVD Cardiac:  irregular, widely split S2 no murmurs, rubs, or gallops. 3+ hard pitting bilateral edema to the knees, even slightly into the lower thighs. Saphenectomy scars are healing well but there is a little weeping from the right lower thigh incision. Respiratory: Clear to auscultation bilaterally. GI: Soft, nontender, non-distended  MS: No edema; No deformity. Neuro:  Nonfocal  Psych: Normal affect   Labs    Chemistry Recent Labs Lab 04/02/17 1158 04/03/17 0124 04/03/17 0636  NA 125* 126* 128*  K 3.7 4.3 4.1  CL 86* 88* 89*  CO2 30 31 31   GLUCOSE 149* 122* 144*  BUN 35* 30* 28*  CREATININE 1.21 1.06 1.02  CALCIUM 8.8* 8.6*  8.9  GFRNONAA 60* >60 >60  GFRAA >60 >60 >60  ANIONGAP 9 7 8      Hematology Recent Labs Lab 04/02/17 1158 04/03/17 0636  WBC 12.4* 11.8*  RBC 3.42* 3.29*  HGB 10.6* 10.2*  HCT 30.5* 29.4*  MCV 89.2 89.4  MCH 31.0 31.0  MCHC 34.8 34.7  RDW 11.7 11.9  PLT 452* 464*    Cardiac Enzymes Recent Labs Lab 04/02/17 1205 04/02/17 1831 04/03/17 0124 04/03/17 0636  TROPONINI 0.15* 0.14* 0.12* 0.12*   No results for input(s): TROPIPOC in the last 168 hours.   BNPNo results for input(s): BNP, PROBNP in the last 168 hours.   DDimer No results for input(s): DDIMER in the last 168 hours.   Radiology    Dg Chest Portable 1 View  Result Date: 04/02/2017 CLINICAL DATA:  Atrial fibrillation.  Hypertension. EXAM: PORTABLE CHEST 1 VIEW COMPARISON:  March 25, 2017 FINDINGS: There is a small left pleural effusion with mild left base atelectasis. Lungs elsewhere are clear. There is cardiomegaly, stable. The pulmonary vascularity is normal. No adenopathy. Patient is status  post coronary artery bypass grafting. No evident bone lesions. IMPRESSION: Stable cardiomegaly. Small left pleural effusion with mild left base atelectasis. Lungs elsewhere clear. Electronically Signed   By: Lowella Grip III M.D.   On: 04/02/2017 12:19    Cardiac Studies   TEE July 2 showed LVEF 50-55%, aortic valve sclerosis without stenosis  Patient Profile     66 y.o. male with atrial flutter with rapid ventricular response (now probably atrial fibrillation) and volume overload, roughly 12 days following CABG for multivessel coronary artery disease. Background problems include morbid obesity, alcohol abuse and anxiety, type 2 diabetes mellitus, hypertension and obstructive sleep apnea.  Assessment & Plan    1. Postop atrial fibrillation: Beta blocker dose can be increased, continue loading dose of amiodarone. Stop aspirin since he is now on Xarelto. Hopefully can achieve adequate rate control without hypotension within the next 24 hours. He does not need emergency cardioversion. High likelihood he will convert to normal rhythm by himself over the next few days. May need electrical cardioversion if that does not occur or if we cannot achieve adequate rate control. 2. CHF: Preserved systolic function, volume overload after surgery, received additional 1 L bolus yesterday for hypotension, probably has right heart failure related to obesity and obstructive sleep apnea. We'll give a single dose of IV diuretic today. Hyponatremia is apparently chronic, but also likely related to volume overload. 3. CAD s/p CABG: Stop aspirin since he is not Xarelto, to reduce bleeding risk 4. HTN: We'll hold irbesartan since we need to use AV blocking agents and his blood pressure is low.  Signed, Sanda Klein, MD  04/03/2017, 9:14 AM

## 2017-04-03 NOTE — Progress Notes (Signed)
PROGRESS NOTE   David Frost  VXB:[id number]    DOB: 1951/07/05    DOA: 04/02/2017  PCP: [name], MD   I have briefly reviewed patients previous medical records in [facility].  Brief Narrative:  66 year old male with PMH of 6 graft bypass surgery done on 03/22/17 after an NSTEMI, discharged on 03/26/17, seen by his PCP on 04/01/17 and at which time they had "difficulty getting his blood pressure", noted to be in A. fib with RVR, started on Xarelto and asked to follow-up with his cardiologist, seen for scheduled appointment the next day 7/13 at [place] office where he was found to be hypotensive, in rapid A. fib and sent to the ED for further evaluation. Blood pressures improved with rate control. He also has PMH of DM 2, GERD, HTN, OSA-noncompliant with CPAP (does not fit well), tobacco and alcohol abuse, anxiety. Cardiology and TCTS consulting.   Assessment & Plan:   Principal Problem:   Atrial fibrillation with RVR (HCC) Active Problems:   Essential hypertension   Obstructive sleep apnea   DM2 (diabetes mellitus, type 2) (HCC)   Alcohol abuse   Acute on chronic right-sided heart failure ([place])   1. Postop A. fib with RVR: Treated initially with IV Cardizem which had to be stopped because of hypotension. Amiodarone 400 MG twice a day added. Continue Xarelto. Aspirin DC'ed. Toprol-XL increased to 50 mg daily. Cardiology follow-up appreciated, discussed with Dr.Croitoru: No indication for emergent cardioversion, high likelihood he will convert to normal sinus rhythm spontaneously but may need electrical cardioversion if that does not occur in the next few days or if rate cannot be adequately controlled. 2. Acute on chronic diastolic CHF: Received additional dose of Lasix 40 mg IV 1 dose today. Continue oral Lasix 40 mg daily. Strict intake output. He likely has right heart failure related to poorly treated OSA and obesity. 3. Essential hypertension: Irbesartan held. Toprol-XL  increased to 50 mg daily. Controlled. 4. CAD status post recent CABG: TCTS follow up appreciated. Have started Keflex for right thigh wound. 5. Anxiety: Continue Xanax 0.25 MG twice a day when necessary. 6. OSA: States that he does not use CPAP because it does not fit well. Outpatient follow-up. 7. Tobacco abuse: States that he quit prior to recent hospitalization for his MI. 8. Alcohol abuse: States that he quit prior to recent hospitalization for his MI. 9. DM 2: Controlled on sliding scale, continue. Hold Invokana 10. Elevated troponin: Flat trend. No chest pain. Likely related to recent MI, CABG. 11. Hyponatremia: Likely related to hypervolemia. Improving. Follow BMP. 12. Acute blood loss anemia: From recent CABG. Stable. Follow CBCs periodically.   DVT prophylaxis: Xarelto Code Status: Full Family Communication: Discussed in detail with patient's spouse at bedside. Disposition: DC home when medically improved.   Consultants:  Cardiology TCTS   Procedures:  None  Antimicrobials:  Keflex    Subjective: Seen this morning. Feels frustrated being in the hospital. DOE, worse than usual. No chest pain. Bilateral leg swelling. States that he chronically sleeps on a recliner for years. No dizziness, lightheadedness or palpitations.   ROS: Anxiety and states that he takes when necessary Xanax at home. Has not smoked or drank alcohol since last admission.  Objective:  Vitals:   04/03/17 0448 04/03/17 0523 04/03/17 0735 04/03/17 1107  BP: 126/83 109/71 116/62 101/65  Pulse: 99 78 (!) 54 (!) 54  Resp: (!) 23 20 18 18   Temp: 98.7 F (37.1 C)  98.5 F (36.9  C) 98.2 F (36.8 C)  TempSrc: Oral  Oral Oral  SpO2: 96% 97% 96% 96%  Weight: (!) 136.7 kg (301 lb 4.8 oz)     Height:        Examination:  General exam: Pleasant middle-aged male, moderately built and obese, sitting up comfortably in chair this morning. Respiratory system: Slightly diminished breath sounds in the bases  with occasional basal crackles but otherwise clear to auscultation. Respiratory effort normal. Cardiovascular system: S1 & S2 heard, irregularly irregular and mildly tachycardic with ventricular rate in the 90s-100s, occasionally to the 110s. No JVD or murmurs. 3+ pitting bilateral leg edema. Gastrointestinal system: Abdomen is nondistended, soft and nontender. No organomegaly or masses felt. Normal bowel sounds heard. Central nervous system: Alert and oriented. No focal neurological deficits. Extremities: Symmetric 5 x 5 power. Skin: Right thigh surgical wound with mild patchy erythema around it and serous drainage. No fluctuance Psychiatry: Judgement and insight appear normal. Mood & affect appropriate.     Data Reviewed: I have personally reviewed following labs and imaging studies  CBC:  Recent Labs Lab 04/02/17 1158 04/03/17 0636  WBC 12.4* 11.8*  HGB 10.6* 10.2*  HCT 30.5* 29.4*  MCV 89.2 89.4  PLT 452* 240*   Basic Metabolic Panel:  Recent Labs Lab 04/02/17 1158 04/03/17 0124 04/03/17 0636  NA 125* 126* 128*  K 3.7 4.3 4.1  CL 86* 88* 89*  CO2 30 31 31   GLUCOSE 149* 122* 144*  BUN 35* 30* 28*  CREATININE 1.21 1.06 1.02  CALCIUM 8.8* 8.6* 8.9   Cardiac Enzymes:  Recent Labs Lab 04/02/17 1205 04/02/17 1831 04/03/17 0124 04/03/17 0636  TROPONINI 0.15* 0.14* 0.12* 0.12*   CBG:  Recent Labs Lab 04/02/17 2211 04/03/17 0730 04/03/17 1105  GLUCAP 149* 145* 142*    Recent Results (from the past 240 hour(s))  Blood culture (routine x 2)     Status: None (Preliminary result)   Collection Time: 04/02/17  3:31 PM  Result Value Ref Range Status   Specimen Description BLOOD LEFT ANTECUBITAL  Final   Special Requests   Final    BOTTLES DRAWN AEROBIC AND ANAEROBIC Blood Culture adequate volume   Culture NO GROWTH < 24 HOURS  Final   Report Status PENDING  Incomplete  Blood culture (routine x 2)     Status: None (Preliminary result)   Collection Time:  04/02/17  3:54 PM  Result Value Ref Range Status   Specimen Description BLOOD LEFT HAND  Final   Special Requests   Final    BOTTLES DRAWN AEROBIC ONLY Blood Culture adequate volume   Culture NO GROWTH < 24 HOURS  Final   Report Status PENDING  Incomplete         Radiology Studies: Dg Chest Portable 1 View  Result Date: 04/02/2017 CLINICAL DATA:  Atrial fibrillation.  Hypertension. EXAM: PORTABLE CHEST 1 VIEW COMPARISON:  March 25, 2017 FINDINGS: There is a small left pleural effusion with mild left base atelectasis. Lungs elsewhere are clear. There is cardiomegaly, stable. The pulmonary vascularity is normal. No adenopathy. Patient is status post coronary artery bypass grafting. No evident bone lesions. IMPRESSION: Stable cardiomegaly. Small left pleural effusion with mild left base atelectasis. Lungs elsewhere clear. Electronically Signed   By: Lowella Grip III M.D.   On: 04/02/2017 12:19        Scheduled Meds: . amiodarone  400 mg Oral BID  . atorvastatin  80 mg Oral q1800  . brimonidine  1 drop Both  Eyes BID   And  . timolol  1 drop Both Eyes BID  . cephALEXin  500 mg Oral TID WC  . folic acid  1 mg Oral Daily  . furosemide  40 mg Oral Daily  . insulin aspart  0-9 Units Subcutaneous TID WC  . [START ON 04/04/2017] metoprolol succinate  50 mg Oral Daily  . multivitamin with minerals  1 tablet Oral Daily  . [START ON 04/04/2017] pneumococcal 23 valent vaccine  0.5 mL Intramuscular Tomorrow-1000  . potassium chloride SA  40 mEq Oral Daily  . rivaroxaban  20 mg Oral Q supper  . thiamine  100 mg Oral Daily   Continuous Infusions: . diltiazem (CARDIZEM) infusion Stopped (04/02/17 2130)     LOS: 1 day     ,, MD, FACP, FHM. [facility] Pager [phone]  If 7PM-7AM, please contact night-coverage [email or web address] Password [email or web address] 04/03/2017, 12:56 PM

## 2017-04-04 ENCOUNTER — Other Ambulatory Visit: Payer: Self-pay | Admitting: Physician Assistant

## 2017-04-04 DIAGNOSIS — E119 Type 2 diabetes mellitus without complications: Secondary | ICD-10-CM

## 2017-04-04 DIAGNOSIS — E871 Hypo-osmolality and hyponatremia: Secondary | ICD-10-CM

## 2017-04-04 LAB — BASIC METABOLIC PANEL
Anion gap: 8 (ref 5–15)
BUN: 30 mg/dL — AB (ref 6–20)
CO2: 32 mmol/L (ref 22–32)
CREATININE: 1.32 mg/dL — AB (ref 0.61–1.24)
Calcium: 8.6 mg/dL — ABNORMAL LOW (ref 8.9–10.3)
Chloride: 88 mmol/L — ABNORMAL LOW (ref 101–111)
GFR calc Af Amer: >60 mL/min (ref >60)
GFR, EST NON AFRICAN AMERICAN: 54 mL/min — AB (ref >60)
GLUCOSE: 129 mg/dL — AB (ref 65–99)
Potassium: 4.1 mmol/L (ref 3.5–5.1)
SODIUM: 128 mmol/L — AB (ref 135–145)

## 2017-04-04 LAB — CBC
HCT: 28.9 % — ABNORMAL LOW (ref 39.0–52.0)
Hemoglobin: 10.1 g/dL — ABNORMAL LOW (ref 13.0–17.0)
MCH: 31.5 pg (ref 26.0–34.0)
MCHC: 34.9 g/dL (ref 30.0–36.0)
MCV: 90 fL (ref 78.0–100.0)
PLATELETS: 515 10*3/uL — AB (ref 150–400)
RBC: 3.21 MIL/uL — ABNORMAL LOW (ref 4.22–5.81)
RDW: 11.9 % (ref 11.5–15.5)
WBC: 14.8 10*3/uL — ABNORMAL HIGH (ref 4.0–10.5)

## 2017-04-04 LAB — GLUCOSE, CAPILLARY
GLUCOSE-CAPILLARY: 140 mg/dL — AB (ref 65–99)
Glucose-Capillary: 109 mg/dL — ABNORMAL HIGH (ref 65–99)

## 2017-04-04 MED ORDER — AMIODARONE HCL 400 MG PO TABS: 400.0000 mg | ORAL_TABLET | Freq: Two times a day (BID) | ORAL | 0 refills | Status: DC

## 2017-04-04 MED ORDER — CEPHALEXIN 500 MG PO CAPS: 500.0000 mg | ORAL_CAPSULE | Freq: Three times a day (TID) | ORAL | 0 refills | Status: DC

## 2017-04-04 MED ORDER — METOPROLOL SUCCINATE ER 25 MG PO TB24: 50.0000 mg | ORAL_TABLET | Freq: Every day | ORAL | 0 refills | Status: DC

## 2017-04-04 NOTE — Progress Notes (Signed)
Progress Note  Patient Name: David Frost Date of Encounter: 04/04/2017  Primary Cardiologist: Gwenlyn Found  Subjective   Feels much better and is extremely eager to go home. Rate control has improved with average ventricular rate at rest around 90-95 with activity heart rate increases to 110. Still has severe bilateral leg edema, but there is no longer any weeping from the saphenectomy site. Afebrile. Started on antibiotics for cellulitis. No bleeding. Urine output is not accurately recorded. He only used a measuring cup 1 or 2 times.   Inpatient Medications    Scheduled Meds: . amiodarone  400 mg Oral BID  . atorvastatin  80 mg Oral q1800  . brimonidine  1 drop Both Eyes BID   And  . timolol  1 drop Both Eyes BID  . cephALEXin  500 mg Oral TID WC  . folic acid  1 mg Oral Daily  . furosemide  40 mg Oral Daily  . insulin aspart  0-9 Units Subcutaneous TID WC  . metoprolol succinate  50 mg Oral Daily  . multivitamin with minerals  1 tablet Oral Daily  . pneumococcal 23 valent vaccine  0.5 mL Intramuscular Tomorrow-1000  . potassium chloride SA  40 mEq Oral Daily  . rivaroxaban  20 mg Oral Q supper  . thiamine  100 mg Oral Daily   Continuous Infusions:  PRN Meds: acetaminophen, ALPRAZolam, hydrALAZINE, ondansetron **OR** ondansetron (ZOFRAN) IV, polyethylene glycol   Vital Signs    Vitals:   04/04/17 0055 04/04/17 0125 04/04/17 0425 04/04/17 0724  BP: (!) 84/62 105/81 106/67 104/73  Pulse: 95  69 62  Resp: 18 16 18 17   Temp: 98.1 F (36.7 C)  98.8 F (37.1 C) 98.7 F (37.1 C)  TempSrc: Oral  Oral Oral  SpO2: 96% 98% 93% 97%  Weight:   (!) 301 lb 8 oz (136.8 kg)   Height:        Intake/Output Summary (Last 24 hours) at 04/04/17 1158 Last data filed at 04/04/17 0645  Gross per 24 hour  Intake             1400 ml  Output              750 ml  Net              650 ml   Filed Weights   04/02/17 1834 04/03/17 0448 04/04/17 0425  Weight: (!) 305 lb 1.6 oz (138.4  kg) (!) 301 lb 4.8 oz (136.7 kg) (!) 301 lb 8 oz (136.8 kg)    Telemetry    Atrial fibrillation with fair ventricular rate control (90 bpm at rest, 110 bpm walking) - Personally Reviewed   Physical Exam  Looks comfortable fully alert and oriented, morbidly obese GEN: No acute distress.   Neck: No JVD Cardiac:  irregular, no murmurs, rubs, or gallops. 3+ hard pitting edema with chronic dystrophic skin changes bilaterally. Respiratory: Clear to auscultation bilaterally. GI: Soft, nontender, non-distended  MS: No deformity. Neuro:  Nonfocal  Psych: Normal affect   Labs    Chemistry Recent Labs Lab 04/03/17 0124 04/03/17 0636 04/04/17 0328  NA 126* 128* 128*  K 4.3 4.1 4.1  CL 88* 89* 88*  CO2 31 31 32  GLUCOSE 122* 144* 129*  BUN 30* 28* 30*  CREATININE 1.06 1.02 1.32*  CALCIUM 8.6* 8.9 8.6*  GFRNONAA >60 >60 54*  GFRAA >60 >60 >60  ANIONGAP 7 8 8      Hematology Recent Labs Lab 04/02/17  1158 04/03/17 0636 04/04/17 0328  WBC 12.4* 11.8* 14.8*  RBC 3.42* 3.29* 3.21*  HGB 10.6* 10.2* 10.1*  HCT 30.5* 29.4* 28.9*  MCV 89.2 89.4 90.0  MCH 31.0 31.0 31.5  MCHC 34.8 34.7 34.9  RDW 11.7 11.9 11.9  PLT 452* 464* 515*    Cardiac Enzymes Recent Labs Lab 04/02/17 1205 04/02/17 1831 04/03/17 0124 04/03/17 0636  TROPONINI 0.15* 0.14* 0.12* 0.12*   No results for input(s): TROPIPOC in the last 168 hours.   BNPNo results for input(s): BNP, PROBNP in the last 168 hours.   DDimer No results for input(s): DDIMER in the last 168 hours.   Radiology    Dg Chest Portable 1 View  Result Date: 04/02/2017 CLINICAL DATA:  Atrial fibrillation.  Hypertension. EXAM: PORTABLE CHEST 1 VIEW COMPARISON:  March 25, 2017 FINDINGS: There is a small left pleural effusion with mild left base atelectasis. Lungs elsewhere are clear. There is cardiomegaly, stable. The pulmonary vascularity is normal. No adenopathy. Patient is status post coronary artery bypass grafting. No evident bone  lesions. IMPRESSION: Stable cardiomegaly. Small left pleural effusion with mild left base atelectasis. Lungs elsewhere clear. Electronically Signed   By: Lowella Grip III M.D.   On: 04/02/2017 12:19     Patient Profile     66 y.o. male with recurrent postoperative atrial fibrillation rapid ventricular response, following multivessel bypass surgery, with hypervolemia and a small area of wound related cellulitis. Significant background problems include morbid obesity, obstructive sleep apnea, alcohol abuse, anxiety, type 2 diabetes mellitus, hypertension.  Assessment & Plan    1. AFib: Rate control is not perfect, but acceptable in expected to improve further over the next couple of days as the increased dose of beta blocker fully "kicks in". Also expect we will see some reduction in heart rate following additional diuresis. He is now anticoagulated. Consider cardioversion if he does not spontaneously return to normal rhythm in the next 3 weeks. Urgent cardioversion is not necessary. Keep on metoprolol succinate 50 mg daily. Continue amiodarone loading dose and oral anticoagulants. 2. CHF: He has predominantly right heart failure findings, likely related to obesity and sleep apnea. Preserved LV systolic function. He weighed 318 pounds at hospital discharge after bypass, 307 pounds on this current admission, now down to 301 pounds. He is still clearly hypervolemic valid estimate he needs diuresis of another 15 pounds of fluid or so. Will discharge on oral diuretics, sodium restriction, daily weights. Would prefer roughly 1-2 pounds of net diuresis daily over the next couple of weeks. 3. CAD s/p CABG: No angina. Sternotomy healing well. Small area of cellulitis surrounding the right saphenectomy site. Now on antibiotics. 4. HTN: irbesartan discontinued to allow use of higher doses of AV node blocking agents for A. fib rate control.  5. Hyponatremia: Hypervolemic. He has been hyponatremic since  presentation before bypass surgery. Reports that he used to drink 10 beers every day up to end of June, but has not had any alcohol since. Recommend fluid restriction 1800 mL/day. Repeat labs late next week. We'll arrange follow-up in the next 1-2 weeks.  Signed, Sanda Klein, MD  04/04/2017, 11:58 AM

## 2017-04-04 NOTE — Progress Notes (Signed)
      [address]       [place] [address]             [phone]            Subjective: Patient sitting in chair in hallway. He is very anxious and wants to go home.  Objective: Vital signs in last 24 hours: Temp:  [98.1 F (36.7 C)-99.3 F (37.4 C)] 98.7 F (37.1 C) (07/15 0724) Pulse Rate:  [54-95] 62 (07/15 0724) Cardiac Rhythm: Atrial flutter (07/15 0912) Resp:  [16-18] 17 (07/15 0724) BP: (84-106)/(48-81) 104/73 (07/15 0724) SpO2:  [93 %-99 %] 97 % (07/15 0724) Weight:  [136.8 kg (301 lb 8 oz)] 136.8 kg (301 lb 8 oz) (07/15 0425)   Current Weight  04/04/17 (!) 136.8 kg (301 lb 8 oz)      Intake/Output from previous day: 07/14 0701 - 07/15 0700 In: 1640 [P.O.:1640] Out: 750 [Urine:750]   Physical Exam:  Cardiovascular: IRRR IRRR Extremities: ++ lower extremity edema. Wounds: Sternal wound is clean and dry.  No erythema or signs of infection. Right thigh wound has serous drainage and surrounding erythema. Trace serous drainage from left thigh wound.  Lab Results: CBC:  Recent Labs  04/03/17 0636 04/04/17 0328  WBC 11.8* 14.8*  HGB 10.2* 10.1*  HCT 29.4* 28.9*  PLT 464* 515*   BMET:   Recent Labs  04/03/17 0636 04/04/17 0328  NA 128* 128*  K 4.1 4.1  CL 89* 88*  CO2 31 32  GLUCOSE 144* 129*  BUN 28* 30*  CREATININE 1.02 1.32*  CALCIUM 8.9 8.6*    PT/INR:  Lab Results  Component Value Date   INR 1.28 03/22/2017   INR 1.05 03/21/2017   ABG:  INR: Will add last result for INR, ABG once components are confirmed Will add last 4 CBG results once components are confirmed  Assessment/Plan:  1. CV - A fib.Toprol XL 50 mg daily and Amiodarone 400 mg bid, and Xarelto 20 mg at supper. Management of a fib per cardiology. 2.  Pulmonary - On room air. Encourage incentive spirometer. 3. Volume Overload - On Lasix 40 mg daily 4.  Acute blood loss anemia - H and H stable at 10.1 and 28.9 5. Hyponatremia-sodium 128, likely  related to diuresis 6. Regarding right thigh wound, patient saw PCP and he gave him a prescription for Doxycycline. He filled it but has not taken it yet. Continue daily Keflex. Dressing changes daily and PRN. 7. Creatinine slightly increased to 1.32. Related to diuresis  , MPA-C 04/04/2017,9:35 AM

## 2017-04-04 NOTE — Discharge Summary (Signed)
Physician Discharge Summary  David Frost JGG:[id number] DOB: December 25, 1950  PCP: [name], MD  Admit date: 04/02/2017 Discharge date: 04/04/2017  Recommendations for Outpatient Follow-up:  1. Almyra Deforest, PA/Cardiology on 04/07/2017 at 10:30 AM. To be seen with repeat labs (CBC & BMP). 2. Dr. Bevelyn Buckles, PCP in 1 week. Please follow final blood culture results that were sent from the hospital.   Home Health: None Equipment/Devices: None    Discharge Condition: Improved and stable  CODE STATUS: Full  Diet recommendation: Heart healthy diet.  Discharge Diagnoses:  Principal Problem:   Atrial fibrillation with RVR ([place]) Active Problems:   Essential hypertension   Obstructive sleep apnea   DM2 (diabetes mellitus, type 2) (HCC)   Alcohol abuse   Acute on chronic right-sided heart failure [facility])   Brief Summary: 66 year old male with PMH of 6 graft bypass surgery done on 03/22/17 after an NSTEMI, discharged on 03/26/17, seen by his PCP on 04/01/17 and at which time they had "difficulty getting his blood pressure", noted to be in A. fib with RVR, started on Xarelto and asked to follow-up with his cardiologist, seen for scheduled appointment the next day 7/13 at [place] office where he was found to be hypotensive, in rapid A. fib and sent to the ED for further evaluation. Blood pressures improved with rate control. He also has PMH of DM 2, GERD, HTN, OSA-noncompliant with CPAP (does not fit well), tobacco and alcohol abuse, anxiety. Cardiology and TCTS consulted.   Assessment & Plan:   1. Postop A. fib with RVR: Cardiology was consulted. Treated initially with IV Cardizem which had to be stopped because of hypotension. Amiodarone 400 MG twice a day initiated. Continue Xarelto. Aspirin DC'ed. Toprol-XL increased to 50 mg daily. Heart rate at rest ranging around 90-95 and with activity increases to 110s. As per cardiology follow-up, rate controlled is not perfect, but acceptable and is  expected to improve further over the next couple of days as the increased dose of beta blocker effect gets in along with ongoing diuresis. They may consider cardioversion if he does not spontaneously returned to normal rhythm in the next 3 weeks but urgent cardioversion is not necessary. Cardiology cleared him for discharge and patient has close outpatient follow-up appointment. 2. Acute on chronic diastolic CHF: Cardiology was consulted. He has predominantly right heart failure findings, likely related to obesity and untreated sleep apnea. He received a dose of IV Lasix 40 mg daily on day prior to discharge and home dose of Lasix was continued. He diuresed well (intake and output not accurately charted) and leg edema is slightly better but he still has significant volume excess. His creatinine has bumped up slightly to 1.3. As per discussion with cardiology, recommend continuing home dose of Lasix 40 mg daily and Zaroxolyn. Patient counseled extensively regarding need for compliance with medications, sodium restriction and daily weights.  3. Essential hypertension: Irbesartan discontinued to allow use of higher doses of AV node blocking agents for A. fib rate control. Toprol-XL increased to 50 mg daily. Controlled. 4. CAD status post recent CABG: TCTS follow up appreciated. Have started Keflex for right thigh wound which is improving 5. Anxiety: Continue Xanax 0.25 MG twice a day when necessary. 6. OSA: States that he does not use CPAP because it does not fit well. Outpatient follow-up. Patient and spouse were advised that he should be reassessed as outpatient to see if he can get a better fitting mask to improve compliance and reduce strain effects on  his heart. 7. Tobacco abuse: States that he quit prior to recent hospitalization for his MI. Congratulated him and advised him to continue abstinence. 8. Alcohol abuse: States that he quit prior to recent hospitalization for his MI. Congratulated him and  advised him to continue abstinence. 9. DM 2: Continue Invokana 10. Elevated troponin: Flat trend. No chest pain. Likely related to recent MI, CABG. 11. Hyponatremia: Likely related to hypervolemia. Improving/stable. Follow BMP closely as outpatient. 12. Acute blood loss anemia: From recent CABG. Stable. Follow CBCs periodically.    Consultants:  Cardiology TCTS   Procedures:  None   Discharge Instructions  Discharge Instructions    (HEART FAILURE PATIENTS) Call MD:  Anytime you have any of the following symptoms: 1) 3 pound weight gain in 24 hours or 5 pounds in 1 week 2) shortness of breath, with or without a dry hacking cough 3) swelling in the hands, feet or stomach 4) if you have to sleep on extra pillows at night in order to breathe.    Complete by:  As directed    Call MD for:    Complete by:  As directed    Palpitations or heart racing.   Call MD for:  difficulty breathing, headache or visual disturbances    Complete by:  As directed    Call MD for:  extreme fatigue    Complete by:  As directed    Call MD for:  persistant dizziness or light-headedness    Complete by:  As directed    Call MD for:  redness, tenderness, or signs of infection (pain, swelling, redness, odor or green/yellow discharge around incision site)    Complete by:  As directed    Call MD for:  temperature >100.4    Complete by:  As directed    Diet - low sodium heart healthy    Complete by:  As directed    Diet Carb Modified    Complete by:  As directed    Increase activity slowly    Complete by:  As directed        Medication List    STOP taking these medications   aspirin 325 MG EC tablet   valsartan 320 MG tablet Commonly known as:  DIOVAN     TAKE these medications   acetaminophen 325 MG tablet Commonly known as:  TYLENOL Take 2 tablets (650 mg total) by mouth every 6 (six) hours as needed for mild pain.   ALPRAZolam 0.25 MG tablet Commonly known as:  XANAX Take 1 tablet  (0.25 mg total) by mouth 2 (two) times daily as needed for anxiety.   amiodarone 400 MG tablet Commonly known as:  PACERONE Take 1 tablet (400 mg total) by mouth 2 (two) times daily.   atorvastatin 80 MG tablet Commonly known as:  LIPITOR Take 1 tablet (80 mg total) by mouth daily at 6 PM.   cephALEXin 500 MG capsule Commonly known as:  KEFLEX Take 1 capsule (500 mg total) by mouth 3 (three) times daily with meals.   COMBIGAN 0.2-0.5 % ophthalmic solution Generic drug:  brimonidine-timolol Place 1 drop into both eyes 2 (two) times daily.   folic acid 1 MG tablet Commonly known as:  FOLVITE Take 1 tablet (1 mg total) by mouth daily.   furosemide 40 MG tablet Commonly known as:  LASIX Take 1 tablet (40 mg total) by mouth daily.   INVOKANA 300 MG Tabs tablet Generic drug:  canagliflozin Take 300 mg by mouth daily.  metolazone 5 MG tablet Commonly known as:  ZAROXOLYN TAKE 1 TABLET BY MOUTH DAILY   metoprolol succinate 25 MG 24 hr tablet Commonly known as:  TOPROL-XL Take 2 tablets (50 mg total) by mouth daily. What changed:  how much to take   multivitamin with minerals Tabs tablet Take 1 tablet by mouth daily.   potassium chloride SA 20 MEQ tablet Commonly known as:  K-DUR,KLOR-CON TAKE 2 TABLETS BY MOUTH DAILY   thiamine 100 MG tablet Take 1 tablet (100 mg total) by mouth daily.   traMADol 50 MG tablet Commonly known as:  ULTRAM Take 1-2 tablets (50-100 mg total) by mouth every 4 (four) hours as needed for moderate pain.   XARELTO 20 MG Tabs tablet Generic drug:  rivaroxaban Take 1 tablet by mouth daily.      Follow-up Information    [name], MD. Schedule an appointment as soon as possible for a visit in 1 week(s).   Specialty:  Internal Medicine Contact information: [place] [address] [phone]        Almyra Deforest, [place] Follow up on 04/07/2017.   Specialties:  Cardiology, Radiology Why:  Keep prior appointment. 10:30  am. To be seen with repeat labs (CBC & BMP). Contact information: [address] [place] [address] [phone]          Allergies  Allergen Reactions  . No Known Allergies       Procedures/Studies: Dg Chest Portable 1 View  Result Date: 04/02/2017 CLINICAL DATA:  Atrial fibrillation.  Hypertension. EXAM: PORTABLE CHEST 1 VIEW COMPARISON:  March 25, 2017 FINDINGS: There is a small left pleural effusion with mild left base atelectasis. Lungs elsewhere are clear. There is cardiomegaly, stable. The pulmonary vascularity is normal. No adenopathy. Patient is status post coronary artery bypass grafting. No evident bone lesions. IMPRESSION: Stable cardiomegaly. Small left pleural effusion with mild left base atelectasis. Lungs elsewhere clear. Electronically Signed   By: Lowella Grip III M.D.   On: 04/02/2017 12:19    Subjective: Patient seen this morning. Very anxious to go home. Actually has been sitting on a wheelchair outside his room in the hallway. States that he urinated well after a dose of IV Lasix and his leg swelling is slightly better. Denies dyspnea, chest pain or palpitations. He states that he will do much better in his home settings.  Discharge Exam:  Vitals:   04/04/17 0425 04/04/17 0724 04/04/17 1202 04/04/17 1203  BP: 106/67 104/73 92/78   Pulse: 69 62 (!) 56 62  Resp: 18 17    Temp: 98.8 F (37.1 C) 98.7 F (37.1 C)    TempSrc: Oral Oral Oral   SpO2: 93% 97% 98%   Weight: (!) 136.8 kg (301 lb 8 oz)     Height:         General exam: Pleasant middle-aged male, moderately built and obese, sitting up comfortably in a wheelchair in the hallway this morning Respiratory system: Slightly diminished breath sounds in the bases with occasional basal crackles but otherwise clear to auscultation. Respiratory effort normal. Cardiovascular system: S1 & S2 heard, irregularly irregular and mildly tachycardic with ventricular rate in the 90s-100s,  occasionally to the 110s. No JVD or murmurs. 3+ pitting bilateral leg edema. Gastrointestinal system: Abdomen is nondistended, soft and nontender. No organomegaly or masses felt. Normal bowel sounds heard. Central nervous system: Alert and oriented. No focal neurological deficits. Extremities: Symmetric 5 x 5 power. Skin: Right thigh surgical wound with  mild patchy erythema (decreased) around it and no further drainage. No fluctuance Psychiatry: Judgement and insight appear normal. Mood & affect appropriate.    The results of significant diagnostics from this hospitalization (including imaging, microbiology, ancillary and laboratory) are listed below for reference.     Microbiology: Recent Results (from the past 240 hour(s))  Blood culture (routine x 2)     Status: None (Preliminary result)   Collection Time: 04/02/17  3:31 PM  Result Value Ref Range Status   Specimen Description BLOOD LEFT ANTECUBITAL  Final   Special Requests   Final    BOTTLES DRAWN AEROBIC AND ANAEROBIC Blood Culture adequate volume   Culture NO GROWTH < 24 HOURS  Final   Report Status PENDING  Incomplete  Blood culture (routine x 2)     Status: None (Preliminary result)   Collection Time: 04/02/17  3:54 PM  Result Value Ref Range Status   Specimen Description BLOOD LEFT HAND  Final   Special Requests   Final    BOTTLES DRAWN AEROBIC ONLY Blood Culture adequate volume   Culture NO GROWTH < 24 HOURS  Final   Report Status PENDING  Incomplete     Labs: CBC:  Recent Labs Lab 04/02/17 1158 04/03/17 0636 04/04/17 0328  WBC 12.4* 11.8* 14.8*  HGB 10.6* 10.2* 10.1*  HCT 30.5* 29.4* 28.9*  MCV 89.2 89.4 90.0  PLT 452* 464* 527*   Basic Metabolic Panel:  Recent Labs Lab 04/02/17 1158 04/03/17 0124 04/03/17 0636 04/04/17 0328  NA 125* 126* 128* 128*  K 3.7 4.3 4.1 4.1  CL 86* 88* 89* 88*  CO2 30 31 31  32  GLUCOSE 149* 122* 144* 129*  BUN 35* 30* 28* 30*  CREATININE 1.21 1.06 1.02 1.32*  CALCIUM  8.8* 8.6* 8.9 8.6*    Cardiac Enzymes:  Recent Labs Lab 04/02/17 1205 04/02/17 1831 04/03/17 0124 04/03/17 0636  TROPONINI 0.15* 0.14* 0.12* 0.12*   CBG:  Recent Labs Lab 04/03/17 1105 04/03/17 1608 04/03/17 2117 04/04/17 0723 04/04/17 1200  GLUCAP 142* 182* 123* 140* 109*   Urinalysis    Component Value Date/Time   COLORURINE YELLOW 04/03/2017 0440   APPEARANCEUR CLEAR 04/03/2017 0440   LABSPEC 1.016 04/03/2017 0440   PHURINE 5.0 04/03/2017 0440   GLUCOSEU >=500 (A) 04/03/2017 0440   HGBUR NEGATIVE 04/03/2017 0440   BILIRUBINUR NEGATIVE 04/03/2017 0440   KETONESUR NEGATIVE 04/03/2017 0440   PROTEINUR 30 (A) 04/03/2017 0440   NITRITE NEGATIVE 04/03/2017 0440   LEUKOCYTESUR NEGATIVE 04/03/2017 0440    Discussed in detail with patient's spouse at bedside. Updated care and answered questions.  Time coordinating discharge: Less than 30 minutes  SIGNED:  Vernell Leep, MD, FACP, FHM. [facility] Pager [phone]  If 7PM-7AM, please contact night-coverage [email or web address] Password [facility] 04/04/2017, 12:33 PM

## 2017-04-06 NOTE — Progress Notes (Signed)
Cardiology Office Note    Date:  04/07/2017   ID:  [name], DOB 1951-06-01, MRN [id number]  PCP:  [name], MD  Cardiologist:  Dr. Gwenlyn Found  Chief Complaint  Patient presents with  . Hospitalization Follow-up    seen for Dr. Gwenlyn Found    History of Present Illness:  David Frost is a 66 y.o. male with PMH of HTN, RBBB, DM II, and CAD. He previously had low risks stress test in 2007, echocardiogram also showed normal LV function. He was last seen by Dr. Gwenlyn Found in 2015. He recently went to the hospital on 03/17/2017 with NSTEMI. Echocardiogram showed EF greater than 67%, grade 1 diastolic dysfunction, moderate LVH, moderate LAE, mild MR. Troponin peaked at 3.5. EKG showed ST depression in V1 through V3. He was transferred to [facility]. Cardec catheterization and found to have multivessel CAD, patient and the family requested transfer back to call for bypass surgery evaluation. He ultimately underwent 6 vessel CABG by Dr. Servando Snare on 03/23/2017 with LIMA to LAD, reverse SVG to intermediate, sequential reverse SVG to OM1 and distal left circumflex, sequential reverse SVG to posterior descending and posterior lateral branch of the RCA. Post surgery, he recovered well, he was treated for volume overload with addition of diuretic regimen.  Unfortunately, he went back to the ED on 04/02/2017 with atrial fibrillation with RVR. Apparently he was seen by his primary care physician on 04/01/2017. He was started on Xarelto and was asked to follow-up with cardiology service. However because of persistent symptoms, he was sent to the ED for further evaluation. He was found to be hypotensive with blood pressure 74 and heart rate was 138. Troponin was elevated to 0.15. Blood pressure improved after a liter bolus of IV fluid and the rate control medication. He was started on amiodarone 400 mg twice a day to regulate heart rate. His irbesartan was held. During this admission, he was also  started on antibiotics for cellulitis. He was treated for right heart failure.  He presents today for cardiology office visit. He denies any discomfort. He has no obvious cardiac awareness of atrial fibrillation. His heart rate is relatively fast today. I will increase his metoprolol. He has been on high-dose amiodarone for 7 days, I would decrease amiodarone to 200 mg daily for one month before coming down to 200 mg daily thereafter. I will bring him back in 2 weeks, if he is still on compliant on Xarelto and is still in atrial fibrillation, he will need outpatient cardioversion. Furthermore, his vein harvesting site continued to drain. There is clear fluid drained from left lower extremity vein harvesting site. The right lower extremity vein harvesting site had a nonhealing ulcer. He was treated with a course of antibiotics recently. I asked him to continue to follow up with CT surgery regarding the nonhealing wound. He has at least 1-2+ pitting edema in bilateral lower extremity. Lower extremity feels hardened. He is on a daily dose of Lasix 40 mg daily and metolazone. I am concerned about his renal function. I will obtain a basic metabolic panel.   Past Medical History:  Diagnosis Date  . Anxiety   . Coronary artery disease   . Diabetes mellitus without complication ([place])    type 2  . Dietary indiscretion   . Dyspnea   . GERD (gastroesophageal reflux disease)   . Hypertension   . Obesity   . Obstructive sleep apnea   . Right bundle branch block  Past Surgical History:  Procedure Laterality Date  . CORONARY ARTERY BYPASS GRAFT N/A 03/22/2017   Procedure: CORONARY ARTERY BYPASS GRAFTING (CABG) x6, using the left internal mammary artery and the greater saphenous vein harvested endoscopically from the right thigh and calf and the left thigh. -LIMA to LAD -SVG to RAMUS INTERMEDIATE -SEQ SVG to OM1 and DISTAL LEFT CIRCUMFLEX -SEQ SVG to PDA and PLVB;  Surgeon: [name], MD;  Location:  [facility];  Service: Open Heart Surgery;  Laterality  . DOPPLER ECHOCARDIOGRAPHY  2010  . NM MYOVIEW LTD  2007  . TEE WITHOUT CARDIOVERSION N/A 03/22/2017   Procedure: TRANSESOPHAGEAL ECHOCARDIOGRAM (TEE);  Surgeon: [name], MD;  Location: [facility];  Service: Open Heart Surgery;  Laterality: N/A;  . TONSILLECTOMY      Current Medications: Outpatient Medications Prior to Visit  Medication Sig Dispense Refill  . acetaminophen (TYLENOL) 325 MG tablet Take 2 tablets (650 mg total) by mouth every 6 (six) hours as needed for mild pain.    Marland Kitchen ALPRAZolam (XANAX) 0.25 MG tablet Take 1 tablet (0.25 mg total) by mouth 2 (two) times daily as needed for anxiety. 14 tablet 0  . atorvastatin (LIPITOR) 80 MG tablet Take 1 tablet (80 mg total) by mouth daily at 6 PM. 30 tablet 3  . brimonidine-timolol (COMBIGAN) 0.2-0.5 % ophthalmic solution Place 1 drop into both eyes 2 (two) times daily.     . Canagliflozin (INVOKANA) 300 MG TABS Take 300 mg by mouth daily.    . cephALEXin (KEFLEX) 500 MG capsule Take 1 capsule (500 mg total) by mouth 3 (three) times daily with meals. 18 capsule 0  . folic acid (FOLVITE) 1 MG tablet Take 1 tablet (1 mg total) by mouth daily. 30 tablet 0  . furosemide (LASIX) 40 MG tablet Take 1 tablet (40 mg total) by mouth daily. 30 tablet 0  . metolazone (ZAROXOLYN) 5 MG tablet TAKE 1 TABLET BY MOUTH DAILY 90 tablet 0  . Multiple Vitamin (MULTIVITAMIN WITH MINERALS) TABS tablet Take 1 tablet by mouth daily.    . potassium chloride SA (K-DUR,KLOR-CON) 20 MEQ tablet TAKE 2 TABLETS BY MOUTH DAILY 180 tablet 1  . thiamine 100 MG tablet Take 1 tablet (100 mg total) by mouth daily. 30 tablet 0  . traMADol (ULTRAM) 50 MG tablet Take 1-2 tablets (50-100 mg total) by mouth every 4 (four) hours as needed for moderate pain. 40 tablet 0  . XARELTO 20 MG TABS tablet Take 1 tablet by mouth daily.  2  . amiodarone (PACERONE) 400 MG tablet Take 1 tablet (400 mg total) by mouth 2 (two) times daily. 60  tablet 0  . metoprolol succinate (TOPROL-XL) 25 MG 24 hr tablet Take 2 tablets (50 mg total) by mouth daily. 60 tablet 0   No facility-administered medications prior to visit.      Allergies:   No known allergies   Social History   Social History  . Marital status: Legally Separated    Spouse name: N/A  . Number of children: N/A  . Years of education: N/A   Social History Main Topics  . Smoking status: Former Smoker    Years: 2.00    Types: Cigars    Quit date: 03/08/2017  . Smokeless tobacco: Never Used  . 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 Drug use: No  . Sexual activity: Not Asked   Other Topics Concern  . None  Social History Narrative  . None     Family History:  The patient's family history includes Heart attack in his father.   ROS:   Please see the history of present illness.    ROS All other systems reviewed and are negative.   PHYSICAL EXAM:   VS:  BP 115/72 (BP Location: Left Arm, Cuff Size: Large)   Pulse (!) 108   Ht 6\' 2"  (1.88 m)   Wt (!) 300 lb 6.4 oz (136.3 kg)   BMI 38.57 kg/m    GEN: Well nourished, well developed, in no acute distress  HEENT: normal  Neck: no JVD, carotid bruits, or masses Cardiac: irregularly irregular; no murmurs, rubs, or gallops. 1+ bilateral LE edema. BLE draining Respiratory:  clear to auscultation bilaterally, normal work of breathing GI: soft, nontender, nondistended, + BS MS: no deformity or atrophy  Skin: warm and dry, no rash Neuro:  Alert and Oriented x 3, Strength and sensation are intact Psych: euthymic mood, full affect  Wt Readings from Last 3 Encounters:  04/07/17 (!) 300 lb 6.4 oz (136.3 kg)  04/04/17 (!) 301 lb 8 oz (136.8 kg)  03/27/17 (!) 310 lb 4.8 oz (140.8 kg)      Studies/Labs Reviewed:   EKG:  EKG ordered today.  The ekg ordered today demonstrates Atrial fibrillation, wide ventricular complex. Baseline right bundle branch block. Heart rate 108.  Recent  Labs: 03/23/2017: Magnesium 2.2 03/24/2017: ALT 64 04/04/2017: BUN 30; Creatinine, Ser 1.32; Hemoglobin 10.1; Platelets 515; Potassium 4.1; Sodium 128   Lipid Panel    Component Value Date/Time   CHOL 190 03/18/2017 0503   TRIG 331 (H) 03/18/2017 0503   HDL 29 (L) 03/18/2017 0503   CHOLHDL 6.6 03/18/2017 0503   VLDL 66 (H) 03/18/2017 0503   LDLCALC 95 03/18/2017 0503    Additional studies/ records that were reviewed today include:   Cath 03/16/2017      CABG by Dr. Servando Snare 03/23/2017 PREOPERATIVE DIAGNOSIS:  Recent non-ST-elevation myocardial infarction with 3-vessel coronary artery disease.  POSTOPERATIVE DIAGNOSIS:  Recent non-ST-elevation myocardial infarction with 3-vessel coronary artery disease.  SURGICAL PROCEDURE:  Coronary artery bypass grafting x6 with the left internal mammary to the left anterior descending coronary artery, reverse saphenous vein graft to the intermediate coronary, sequential reverse saphenous vein graft to the first obtuse marginal and distal circumflex, sequential reverse saphenous vein graft to the posterior descending and posterolateral branches of the right coronary artery with endoscopic vein harvesting of the right greater saphenous vein, thigh and calf, and left greater saphenous vein, thigh.     ASSESSMENT:    1. Coronary artery disease involving coronary bypass graft of native heart without angina pectoris   2. Acute on chronic right-sided heart failure ([place])   3. Medication management   4. Essential hypertension   5. Persistent atrial fibrillation ([place])   6. RBBB   7. Controlled type 2 diabetes mellitus without complication, without long-term current use of insulin (HCC)      PLAN:  In order of problems listed above:  1. CAD s/p CABG: No obvious angina. Continue to have bilateral lower extremity wound. There is a ulcer near the right lower extremity vein harvesting site. He will need to be followed closely by cardiothoracic  surgery team. The left lower extremity vein harvesting site is also draining clear fluid. He will keep the lower extremity clean and dry.  2. Acute on chronic right-sided heart failure: Continue to have 1+ pitting edema in bilateral lower  extremity. Currently on 40 mg daily of Lasix and 5 mg dose daily of metolazone. I am concerned of the high-dose metolazone. We will obtain a metabolic panel today. He also had some hyponatremia as well.  3. Persistent atrial fibrillation on Xarelto: Currently on 400 mg twice a day of amiodarone, will decrease to 200 mg twice a day. We'll increase Toprol-XL to 50 mg twice a day. I will bring him back in 2 weeks for reassessment, if still in atrial fibrillation, I will set him up for outpatient cardioversion.  4. Hypertension: Blood pressure stable.  5. DM 2: We will defer to primary care physician.    Medication Adjustments/Labs and Tests Ordered: Current medicines are reviewed at length with the patient today.  Concerns regarding medicines are outlined above.  Medication changes, Labs and Tests ordered today are listed in the Patient Instructions below. Patient Instructions  Medication Instructions:  CONTINUE XARELTO INCREASE METOPROLOL XL TO 50MG  DAILY DECREASE AMIODARONE 200MG  TWICE DAILY FOR ONE MONTH THEN ON 05-08-2017 DECREASE TO 200MG  DAILY  If you need a refill on your cardiac medications before your next appointment, please call your pharmacy.  Labwork: BMP TODAY HERE IN OUR OFFICE AT [facility]  Follow-Up: Your physician wants you to follow-up in: ON 04-21-2017 WITH  -PAC  Thank you for choosing [facility]!!      Monitor blood pressure daily, contact cardiology if systolic blood pressure is < 95 mmHg. Weigh yourself every morning, and keep a weight diary.    Hilbert Corrigan, [place]  04/07/2017 6:53 PM    [facility] [place], [place], [place]  [address] Phone: [phone]; Fax: [phone]

## 2017-04-07 ENCOUNTER — Encounter: Payer: Self-pay | Admitting: Physician Assistant

## 2017-04-07 ENCOUNTER — Ambulatory Visit (INDEPENDENT_AMBULATORY_CARE_PROVIDER_SITE_OTHER): Payer: Medicare Other | Admitting: Physician Assistant

## 2017-04-07 ENCOUNTER — Other Ambulatory Visit: Payer: Self-pay | Admitting: Physician Assistant

## 2017-04-07 VITALS — BP 115/72 | HR 108 | Ht 74.0 in | Wt 300.4 lb

## 2017-04-07 DIAGNOSIS — I481 Persistent atrial fibrillation: Secondary | ICD-10-CM

## 2017-04-07 DIAGNOSIS — Z79899 Other long term (current) drug therapy: Secondary | ICD-10-CM | POA: Diagnosis not present

## 2017-04-07 DIAGNOSIS — I50813 Acute on chronic right heart failure: Secondary | ICD-10-CM | POA: Diagnosis not present

## 2017-04-07 DIAGNOSIS — I4510 Unspecified right bundle-branch block: Secondary | ICD-10-CM

## 2017-04-07 DIAGNOSIS — I25810 Atherosclerosis of coronary artery bypass graft(s) without angina pectoris: Secondary | ICD-10-CM | POA: Diagnosis not present

## 2017-04-07 DIAGNOSIS — E119 Type 2 diabetes mellitus without complications: Secondary | ICD-10-CM | POA: Diagnosis not present

## 2017-04-07 DIAGNOSIS — I10 Essential (primary) hypertension: Secondary | ICD-10-CM | POA: Diagnosis not present

## 2017-04-07 DIAGNOSIS — I4819 Other persistent atrial fibrillation: Secondary | ICD-10-CM

## 2017-04-07 LAB — CULTURE, BLOOD (ROUTINE X 2)
CULTURE: NO GROWTH
Culture: NO GROWTH
Special Requests: ADEQUATE
Special Requests: ADEQUATE

## 2017-04-07 MED ORDER — METOPROLOL SUCCINATE ER 50 MG PO TB24
50.0000 mg | ORAL_TABLET | Freq: Two times a day (BID) | ORAL | 2 refills | Status: DC
Start: 2017-04-07 — End: 2017-05-11

## 2017-04-07 MED ORDER — AMIODARONE HCL 200 MG PO TABS: 200.0000 mg | ORAL_TABLET | Freq: Two times a day (BID) | ORAL | 0 refills | Status: DC

## 2017-04-07 NOTE — Patient Instructions (Addendum)
Medication Instructions:  CONTINUE XARELTO INCREASE METOPROLOL XL TO 50MG  DAILY DECREASE AMIODARONE 200MG  TWICE DAILY FOR ONE MONTH THEN ON 05-08-2017 DECREASE TO 200MG  DAILY  If you need a refill on your cardiac medications before your next appointment, please call your pharmacy.  Labwork: BMP TODAY HERE IN OUR OFFICE AT [facility]  Follow-Up: Your physician wants you to follow-up in: ON 04-21-2017 WITH  -PAC  Thank you for choosing [facility]!!      Monitor blood pressure daily, contact cardiology if systolic blood pressure is < 95 mmHg. Weigh yourself every morning, and keep a weight diary.

## 2017-04-07 NOTE — Telephone Encounter (Signed)
Rx has been sent to the pharmacy electronically. ° °

## 2017-04-07 NOTE — Telephone Encounter (Signed)
Please review for refill, Thanks !  

## 2017-04-08 LAB — BASIC METABOLIC PANEL
BUN / CREAT RATIO: 26 — AB (ref 10–24)
BUN: 37 mg/dL — ABNORMAL HIGH (ref 8–27)
CO2: 33 mmol/L — AB (ref 20–29)
CREATININE: 1.42 mg/dL — AB (ref 0.76–1.27)
Calcium: 9.4 mg/dL (ref 8.6–10.2)
Chloride: 85 mmol/L — ABNORMAL LOW (ref 96–106)
GFR, EST AFRICAN AMERICAN: 58 mL/min/{1.73_m2} — AB (ref >59)
GFR, EST NON AFRICAN AMERICAN: 50 mL/min/{1.73_m2} — AB (ref >59)
Glucose: 125 mg/dL — ABNORMAL HIGH (ref 65–99)
POTASSIUM: 4.5 mmol/L (ref 3.5–5.2)
SODIUM: 134 mmol/L (ref 134–144)

## 2017-04-09 ENCOUNTER — Telehealth: Payer: Self-pay | Admitting: Cardiovascular Disease

## 2017-04-09 DIAGNOSIS — I50813 Acute on chronic right heart failure: Secondary | ICD-10-CM

## 2017-04-09 DIAGNOSIS — I10 Essential (primary) hypertension: Secondary | ICD-10-CM

## 2017-04-09 NOTE — Telephone Encounter (Signed)
Returned call to patient.Lab results given.Advised to decrease metolazone to 5 mg twice a week Mon and Thurs.Repeat bmet in 5 days.

## 2017-04-09 NOTE — Telephone Encounter (Signed)
David Frost is returning a call .Marland Kitchen Thanks

## 2017-04-12 ENCOUNTER — Ambulatory Visit: Payer: Medicare Other | Admitting: Physician Assistant

## 2017-04-15 ENCOUNTER — Other Ambulatory Visit: Payer: Self-pay | Admitting: Physician Assistant

## 2017-04-15 DIAGNOSIS — I50813 Acute on chronic right heart failure: Secondary | ICD-10-CM | POA: Diagnosis not present

## 2017-04-15 DIAGNOSIS — I10 Essential (primary) hypertension: Secondary | ICD-10-CM | POA: Diagnosis not present

## 2017-04-15 LAB — BASIC METABOLIC PANEL
BUN/Creatinine Ratio: 28 — ABNORMAL HIGH (ref 10–24)
BUN: 40 mg/dL — ABNORMAL HIGH (ref 8–27)
CALCIUM: 9.5 mg/dL (ref 8.6–10.2)
CHLORIDE: 87 mmol/L — AB (ref 96–106)
CO2: 34 mmol/L — AB (ref 20–29)
Creatinine, Ser: 1.43 mg/dL — ABNORMAL HIGH (ref 0.76–1.27)
GFR calc Af Amer: 58 mL/min/{1.73_m2} — ABNORMAL LOW (ref >59)
GFR calc non Af Amer: 50 mL/min/{1.73_m2} — ABNORMAL LOW (ref >59)
Glucose: 115 mg/dL — ABNORMAL HIGH (ref 65–99)
POTASSIUM: 4.2 mmol/L (ref 3.5–5.2)
Sodium: 134 mmol/L (ref 134–144)

## 2017-04-19 ENCOUNTER — Ambulatory Visit: Payer: Medicare Other | Admitting: Physician Assistant

## 2017-04-21 ENCOUNTER — Encounter: Payer: Self-pay | Admitting: Cardiovascular Disease

## 2017-04-21 ENCOUNTER — Ambulatory Visit (INDEPENDENT_AMBULATORY_CARE_PROVIDER_SITE_OTHER): Payer: Medicare Other | Admitting: Cardiovascular Disease

## 2017-04-21 VITALS — BP 102/76 | HR 107 | Ht 74.0 in | Wt 298.8 lb

## 2017-04-21 DIAGNOSIS — I4891 Unspecified atrial fibrillation: Secondary | ICD-10-CM | POA: Diagnosis not present

## 2017-04-21 DIAGNOSIS — I2510 Atherosclerotic heart disease of native coronary artery without angina pectoris: Secondary | ICD-10-CM | POA: Diagnosis not present

## 2017-04-21 DIAGNOSIS — I10 Essential (primary) hypertension: Secondary | ICD-10-CM | POA: Diagnosis not present

## 2017-04-21 DIAGNOSIS — G4733 Obstructive sleep apnea (adult) (pediatric): Secondary | ICD-10-CM

## 2017-04-21 NOTE — Assessment & Plan Note (Signed)
History of essential hypertension blood pressure measured today at 102/76. He is on Toprol 50 mg by mouth twice a day for blood pressure and rate control.

## 2017-04-21 NOTE — Progress Notes (Signed)
04/21/2017 David Frost   10/26/1950  [id number]  Primary Physician [name], MD Primary Cardiologist: Lorretta Harp MD FACP, [place], [place], [place]  HPI:  David Frost is a 66 y.o. male  severely overweight divorced Caucasian male father of 2 sons who I last saw 06/14/14. He is accompanied by his son [name] and wife [name]. He has a history of obesity, hypertension, chronic right bundle branch  block and symptoms compatible sleep apnea. He has obstructive sleep apnea on CPAP. He does admit to dietary indiscretion. He says that he loved bagels and salt. He had a Myoview stress test in 2007 which was low risk and a 2-D echo to and 10 that showed normal LV function. He had a non-STEMI in [place] 03/15/17 and was transferred to [facility] where he underwent cardiac catheterization revealing multivessel disease. He was then transferred to [facility] where he underwent coronary artery bypass grafting 6 by Dr. Servando Snare on 03/22/17. His postoperative course was uncomplicated. Soon after discharge she was remitted with A. fib with RVR from which she was symptomatic from and was rate controlled, placed on amiodarone and oral anticoagulation.   Current Meds  Medication Sig  . acetaminophen (TYLENOL) 325 MG tablet Take 2 tablets (650 mg total) by mouth every 6 (six) hours as needed for mild pain.  Marland Kitchen ALPRAZolam (XANAX) 0.25 MG tablet Take 1 tablet (0.25 mg total) by mouth 2 (two) times daily as needed for anxiety.  Marland Kitchen amiodarone (PACERONE) 200 MG tablet TAKE 1 TABLET BY MOUTH TWICE DAILY FOR 1 MONTH, THEN ON 05/08/17 TAKE 1 TABLET DAILY  . atorvastatin (LIPITOR) 80 MG tablet Take 1 tablet (80 mg total) by mouth daily at 6 PM.  . brimonidine-timolol (COMBIGAN) 0.2-0.5 % ophthalmic solution Place 1 drop into both eyes 2 (two) times daily.   . Canagliflozin (INVOKANA) 300 MG TABS Take 300 mg by mouth daily.  . cephALEXin (KEFLEX) 500 MG capsule Take 1 capsule (500 mg total)  by mouth 3 (three) times daily with meals.  . diphenhydrAMINE (BENADRYL) 25 MG tablet Take 50 mg by mouth at bedtime.  . folic acid (FOLVITE) 1 MG tablet Take 1 tablet (1 mg total) by mouth daily.  . furosemide (LASIX) 40 MG tablet Take 1 tablet (40 mg total) by mouth daily.  . metolazone (ZAROXOLYN) 5 MG tablet Take 5 mg twice a week Mon and Thurs  . metoprolol succinate (TOPROL-XL) 50 MG 24 hr tablet Take 1 tablet (50 mg total) by mouth 2 (two) times daily. Take 50mg  twice daily  . Multiple Vitamin (MULTIVITAMIN WITH MINERALS) TABS tablet Take 1 tablet by mouth daily.  . Polyethylene Glycol 3350 (MIRALAX PO) Take 1 Dose by mouth every evening.  . potassium chloride SA (K-DUR,KLOR-CON) 20 MEQ tablet TAKE 2 TABLETS BY MOUTH DAILY  . PROAIR RESPICLICK 939 (90 Base) MCG/ACT AEPB Take 2 puffs by mouth as directed.  . thiamine 100 MG tablet Take 1 tablet (100 mg total) by mouth daily.  . traMADol (ULTRAM) 50 MG tablet Take 1-2 tablets (50-100 mg total) by mouth every 4 (four) hours as needed for moderate pain.  Marland Kitchen XARELTO 20 MG TABS tablet Take 1 tablet by mouth daily.     Allergies  Allergen Reactions  . No Known Allergies     Social History   Social History  . Marital status: Legally Separated    Spouse name: N/A  . Number of children: N/A  . Years of  education: N/A   Occupational History  . Not on file.   Social History Main Topics  . Smoking status: Former Smoker    Years: 2.00    Types: Cigars    Quit date: 03/08/2017  . Smokeless tobacco: Never Used  . 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 Drug use: No  . Sexual activity: Not on file   Other Topics Concern  . Not on file   Social History Narrative  . No narrative on file     Review of Systems: General: negative for chills, fever, night sweats or weight changes.  Cardiovascular: negative for chest pain, dyspnea on exertion, edema, orthopnea, palpitations, paroxysmal nocturnal dyspnea or  shortness of breath Dermatological: negative for rash Respiratory: negative for cough or wheezing Urologic: negative for hematuria Abdominal: negative for nausea, vomiting, diarrhea, bright red blood per rectum, melena, or hematemesis Neurologic: negative for visual changes, syncope, or dizziness All other systems reviewed and are otherwise negative except as noted above.    Blood pressure 102/76, pulse (!) 107, height 6\' 2"  (1.88 m), weight 298 lb 12.8 oz (135.5 kg).  General appearance: alert and no distress Neck: no adenopathy, no carotid bruit, no JVD, supple, symmetrical, trachea midline and thyroid not enlarged, symmetric, no tenderness/mass/nodules Lungs: clear to auscultation bilaterally Heart: irregularly irregular rhythm Extremities: 1-2+ peripheral edema  EKG atrial fibrillation with a ventricular response of 107, right bundle branch block and left anterior fascicular block. I personally reviewed this EKG.  ASSESSMENT AND PLAN:   Essential hypertension History of essential hypertension blood pressure measured today at 102/76. He is on Toprol 50 mg by mouth twice a day for blood pressure and rate control.  Obstructive sleep apnea History of obstructive sleep apnea on CPAP  CAD, multiple vessel History of CAD status post non-STEMI 03/15/17 at [place]. He was transferred to [facility] for cardiac catheterization revealing multivessel disease and ultimately was transferred to [facility] where he underwent coronary artery bypass grafting 6 by Dr. Servando Snare on 03/22/17. His postoperative course was unremarkable.  Atrial fibrillation with RVR (HCC) History of persistent AF post bypass grafting on amiodarone load and Xarelto  as well as high-dose beta blocker for rate control. His ventricular response today is about 100. He is somewhat symptomatic. I'm going to arrange for him to undergo outpatient DC cardioversion sometime in the next 2-3  weeks.      Lorretta Harp MD FACP,FACC,FAHA, [facility] 04/21/2017 2:58 PM

## 2017-04-21 NOTE — Patient Instructions (Signed)
Medication Instructions: Your physician recommends that you continue on your current medications as directed. Please refer to the Current Medication list given to you today.  Labwork: Your physician recommends that you return for lab work.   Testing/Procedures: Your physician has recommended that you have a Cardioversion (DCCV)--third week of August. Electrical Cardioversion uses a jolt of electricity to your heart either through paddles or wired patches attached to your chest. This is a controlled, usually prescheduled, procedure. Defibrillation is done under light anesthesia in the hospital, and you usually go home the day of the procedure. This is done to get your heart back into a normal rhythm. You are not awake for the procedure. Please see the instruction sheet given to you today.  Follow-Up: Your physician recommends that you schedule a follow-up appointment with a PA 1 week after DCCV.  Your physician recommends that you schedule a follow-up appointment in: 3 months with Dr. Gwenlyn Found.  If you need a refill on your cardiac medications before your next appointment, please call your pharmacy.

## 2017-04-21 NOTE — Progress Notes (Signed)
Kidney function seems to have reached plateau. Metolazone dose to be reviewed during office visit today.

## 2017-04-21 NOTE — Assessment & Plan Note (Signed)
History of persistent AF post bypass grafting on amiodarone load and Xarelto  as well as high-dose beta blocker for rate control. His ventricular response today is about 100. He is somewhat symptomatic. I'm going to arrange for him to undergo outpatient DC cardioversion sometime in the next 2-3 weeks.

## 2017-04-21 NOTE — Assessment & Plan Note (Signed)
History of CAD status post non-STEMI 03/15/17 at [place]. He was transferred to newborn for cardiac catheterization revealing multivessel disease and ultimately was transferred to [facility] where he underwent coronary artery bypass grafting 6 by Dr. Servando Snare on 03/22/17. His postoperative course was unremarkable.

## 2017-04-21 NOTE — Assessment & Plan Note (Signed)
History of obstructive sleep apnea on CPAP. 

## 2017-04-22 LAB — CBC WITH DIFFERENTIAL/PLATELET
BASOS ABS: 0 10*3/uL (ref 0.0–0.2)
Basos: 0 %
EOS (ABSOLUTE): 0.2 10*3/uL (ref 0.0–0.4)
Eos: 2 %
HEMOGLOBIN: 10.7 g/dL — AB (ref 13.0–17.7)
Hematocrit: 32.6 % — ABNORMAL LOW (ref 37.5–51.0)
Immature Grans (Abs): 0 10*3/uL (ref 0.0–0.1)
Immature Granulocytes: 0 %
LYMPHS ABS: 1.6 10*3/uL (ref 0.7–3.1)
Lymphs: 21 %
MCH: 29.6 pg (ref 26.6–33.0)
MCHC: 32.8 g/dL (ref 31.5–35.7)
MCV: 90 fL (ref 79–97)
MONOCYTES: 14 %
MONOS ABS: 1 10*3/uL — AB (ref 0.1–0.9)
Neutrophils Absolute: 4.5 10*3/uL (ref 1.4–7.0)
Neutrophils: 63 %
PLATELETS: 352 10*3/uL (ref 150–379)
RBC: 3.61 x10E6/uL — AB (ref 4.14–5.80)
RDW: 13.4 % (ref 12.3–15.4)
WBC: 7.3 10*3/uL (ref 3.4–10.8)

## 2017-04-22 LAB — BASIC METABOLIC PANEL
BUN / CREAT RATIO: 25 — AB (ref 10–24)
BUN: 29 mg/dL — AB (ref 8–27)
CALCIUM: 9 mg/dL (ref 8.6–10.2)
CO2: 32 mmol/L — ABNORMAL HIGH (ref 20–29)
CREATININE: 1.15 mg/dL (ref 0.76–1.27)
Chloride: 92 mmol/L — ABNORMAL LOW (ref 96–106)
GFR calc non Af Amer: 65 mL/min/{1.73_m2} (ref >59)
GFR, EST AFRICAN AMERICAN: 75 mL/min/{1.73_m2} (ref >59)
Glucose: 104 mg/dL — ABNORMAL HIGH (ref 65–99)
Potassium: 4.2 mmol/L (ref 3.5–5.2)
Sodium: 139 mmol/L (ref 134–144)

## 2017-04-22 LAB — PROTIME-INR
INR: 1.4 — AB (ref 0.8–1.2)
Prothrombin Time: 14.6 s — ABNORMAL HIGH (ref 9.1–12.0)

## 2017-04-22 LAB — APTT: aPTT: 36 s — ABNORMAL HIGH (ref 24–33)

## 2017-04-22 LAB — TSH: TSH: 3.39 u[IU]/mL (ref 0.450–4.500)

## 2017-04-22 NOTE — Addendum Note (Signed)
Addended by: Zebedee Iba on: 04/22/2017 11:05 AM   Modules accepted: Orders

## 2017-04-23 ENCOUNTER — Other Ambulatory Visit: Payer: Self-pay | Admitting: Physician Assistant

## 2017-04-26 DIAGNOSIS — I222 Subsequent non-ST elevation (NSTEMI) myocardial infarction: Secondary | ICD-10-CM | POA: Diagnosis not present

## 2017-04-26 DIAGNOSIS — E1151 Type 2 diabetes mellitus with diabetic peripheral angiopathy without gangrene: Secondary | ICD-10-CM | POA: Diagnosis not present

## 2017-04-26 DIAGNOSIS — I480 Paroxysmal atrial fibrillation: Secondary | ICD-10-CM | POA: Diagnosis not present

## 2017-04-26 DIAGNOSIS — F418 Other specified anxiety disorders: Secondary | ICD-10-CM | POA: Diagnosis not present

## 2017-04-26 DIAGNOSIS — L0889 Other specified local infections of the skin and subcutaneous tissue: Secondary | ICD-10-CM | POA: Diagnosis not present

## 2017-04-26 DIAGNOSIS — I25810 Atherosclerosis of coronary artery bypass graft(s) without angina pectoris: Secondary | ICD-10-CM | POA: Diagnosis not present

## 2017-04-26 DIAGNOSIS — Z6841 Body Mass Index (BMI) 40.0 and over, adult: Secondary | ICD-10-CM | POA: Diagnosis not present

## 2017-04-29 ENCOUNTER — Encounter: Payer: Self-pay | Admitting: Cardiothoracic Surgery

## 2017-04-29 ENCOUNTER — Ambulatory Visit
Admission: RE | Admit: 2017-04-29 | Discharge: 2017-04-29 | Disposition: A | Discharge status: Home / Self Care | Payer: Medicare Other | Source: Ambulatory Visit | Attending: Cardiothoracic Surgery | Admitting: Cardiothoracic Surgery

## 2017-04-29 ENCOUNTER — Ambulatory Visit (INDEPENDENT_AMBULATORY_CARE_PROVIDER_SITE_OTHER): Payer: Self-pay | Admitting: Cardiothoracic Surgery

## 2017-04-29 VITALS — BP 133/80 | HR 110 | Resp 16 | Ht 74.0 in | Wt 293.0 lb

## 2017-04-29 DIAGNOSIS — Z951 Presence of aortocoronary bypass graft: Secondary | ICD-10-CM

## 2017-04-29 DIAGNOSIS — I2510 Atherosclerotic heart disease of native coronary artery without angina pectoris: Secondary | ICD-10-CM

## 2017-04-29 DIAGNOSIS — J90 Pleural effusion, not elsewhere classified: Secondary | ICD-10-CM | POA: Diagnosis not present

## 2017-04-29 MED ORDER — FUROSEMIDE 40 MG PO TABS: 40.0000 mg | ORAL_TABLET | Freq: Every day | ORAL | 0 refills | Status: DC

## 2017-04-29 NOTE — Patient Instructions (Signed)
    [address]       , [address]             [phone]       Coronary Artery Bypass Grafting  Care After  Refer to this sheet in the next few weeks. These instructions provide you with information on caring for yourself after your procedure. Your caregiver may also give you more specific instructions. Your treatment has been planned according to current medical practices, but problems sometimes occur. Call your caregiver if you have any problems or questions after your procedure.  Recovery from open heart surgery will be different for everyone. Some people feel well after 3 or 4 weeks, while for others it takes longer. After heart surgery, it may be normal to:  Not have an appetite, feel nauseated by the smell of food, or only want to eat a small amount.   Be constipated because of changes in your diet, activity, and medicines. Eat foods high in fiber. Add fresh fruits and vegetables to your diet. Stool softeners may be helpful.   Feel sad or unhappy. You may be frustrated or cranky. You may have good days and bad days. Do not give up. Talk to your caregiver if you do not feel better.   Feel weakness and fatigue. You many need physical therapy or cardiac rehabilitation to get your strength back.   Develop an irregular heartbeat called atrial fibrillation. Symptoms of atrial fibrillation are a fast, irregular heartbeat or feelings of fluttery heartbeats, shortness of breath, low blood pressure, and dizziness. If these symptoms develop, see your caregiver right away.  MEDICATION  Have a list of all the medicines you will be taking when you leave the hospital. For every medicine, know the following:   Name.   Exact dose.   Time of day to be taken.   How often it should be taken.   Why you are taking it.   Ask which medicines should or should not be taken together. If you take more than one heart medicine, ask if it is okay to take them together. Some  heart medicines should not be taken at the same time because they may lower your blood pressure too much.   Narcotic pain medicine can cause constipation. Eat fresh fruits and vegetables. Add fiber to your diet. Stool softener medicine may help relieve constipation.   Keep a copy of your medicines with you at all times.   Do not add or stop taking any medicine until you check with your caregiver.   Medicines can have side effects. Call your caregiver who prescribed the medicine if you:   Start throwing up, have diarrhea, or have stomach pain.   Feel dizzy or lightheaded when you stand up.   Feel your heart is skipping beats or is beating too fast or too slow.   Develop a rash.   Notice unusual bruising or bleeding.  HOME CARE INSTRUCTIONS  After heart surgery, it is important to learn how to take your pulse. Have your caregiver show you how to take your pulse.   Use your incentive spirometer. Ask your caregiver how long after surgery you need to use it.  Care of your chest incision  Tell your caregiver right away if you notice clicking in your chest (sternum).   Support your chest with a pillow or your arms when you take deep breaths and cough.   Follow your caregiver's instructions about when you can bathe or   swim.   Protect your incision from sunlight during the first year to keep the scar from getting dark.   Tell your caregiver if you notice:   Increased tenderness of your incision.   Increased redness or swelling around your incision.   Drainage or pus from your incision.  Care of your leg incision(s)  Avoid crossing your legs.   Avoid sitting for long periods of time. Change positions every half hour.   Elevate your leg(s) when you are sitting.   Check your leg(s) daily for swelling. Check the incisions for redness or drainage.   Diet is very important to heart health.   Eat plenty of fresh fruits and vegetables. Meats should be lean cut. Avoid canned,  processed, and fried foods.   Talk to a dietician. They can teach you how to make healthy food and drink choices.  Weight  Weigh yourself every day. This is important because it helps to know if you are retaining fluid that may make your heart and lungs work harder.   Use the same scale each time.   Weigh yourself every morning at the same time. You should do this after you go to the bathroom, but before you eat breakfast.   Your weight will be more accurate if you do not wear any clothes.   Record your weight.   Tell your caregiver if you have gained 2 pounds or more overnight.  Activity Stop any activity at once if you have chest pain, shortness of breath, irregular heartbeats, or dizziness. Get help right away if you have any of these symptoms.  Bathing.  Avoid soaking in a bath or hot tub until your incisions are healed.   Rest. You need a balance of rest and activity.   Exercise. Exercise per your caregiver's advice. You may need physical therapy or cardiac rehabilitation to help strengthen your muscles and build your endurance.   Climbing stairs. Unless your caregiver tells you not to climb stairs, go up stairs slowly and rest if you tire. Do not pull yourself up by the handrail.   Driving a car. Follow your caregiver's advice on when you may drive. You may ride as a passenger at any time. When traveling for long periods of time in a car, get out of the car and walk around for a few minutes every 2 hours.   Lifting. Avoid lifting, pushing, or pulling anything heavier than 10 pounds for 6 weeks after surgery or as told by your caregiver.   Returning to work. Check with your caregiver. People heal at different rates. Most people will be able to go back to work 6 to 12 weeks after surgery.   Sexual activity. You may resume sexual relations as told by your caregiver.  SEEK MEDICAL CARE IF:  Any of your incisions are red, painful, or have any type of drainage coming from them.     You have an oral temperature above 101.5 F .   You have ankle or leg swelling.   You have pain in your legs.   You have weight gain of 2 or more pounds a day.   You feel dizzy or lightheaded when you stand up.  SEEK IMMEDIATE MEDICAL CARE IF:  You have angina or chest pain that goes to your jaw or arms. Call your local emergency services right away.   You have shortness of breath at rest or with activity.   You have a fast or irregular heartbeat (arrhythmia).   There is   a "clicking" in your sternum when you move.   You have numbness or weakness in your arms or legs.  MAKE SURE YOU:  Understand these instructions.   Will watch your condition.   Will get help right away if you are not doing well or get worse.    No lifting over 25 lbs for 3 months 

## 2017-04-29 NOTE — Progress Notes (Signed)
[address]       [place] [address]             [phone]      David Frost [facility] Medical Record #[id number] Date of Birth: 1951/06/25  Referring: Pixie Casino, MD Primary Care: [name], MD  Chief Complaint:   POST OP FOLLOW UP 03/22/2017  OPERATIVE REPORT PREOPERATIVE DIAGNOSIS:  Recent non-ST-elevation myocardial infarction with 3-vessel coronary artery disease. POSTOPERATIVE DIAGNOSIS:  Recent non-ST-elevation myocardial infarction with 3-vessel coronary artery disease. SURGICAL PROCEDURE:  Coronary artery bypass grafting x6 with the left internal mammary to the left anterior descending coronary artery, reverse saphenous vein graft to the intermediate coronary, sequential reverse saphenous vein graft to the first obtuse marginal and distal circumflex, sequential reverse saphenous vein graft to the posterior descending and posterolateral branches of the right coronary artery with endoscopic vein harvesting of the right greater saphenous vein, thigh and calf, and left greater saphenous vein, thigh. SURGEON:  Lanelle Bal, MD  History of Present Illness:     Following surgery the patient was readmitted for atrial fibrillation, at that time he was started on anticoagulation and amiodarone. He now comes to the office with persistent atrial fibrillation. He is also had increasing lower extremity edema, he stopped taking Lasix when his prescription ran out and was not refilled on his office visits last week. I've encouraged him to keep his feet propped up and have resumed his Lasix 40 mg once a day.    Past Medical History:  Diagnosis Date  . Anxiety   . Coronary artery disease   . Diabetes mellitus without complication ([facility])    type 2  . Dietary indiscretion   . Dyspnea   . GERD (gastroesophageal reflux disease)   . Hypertension   . Obesity   . Obstructive sleep apnea   . Right bundle branch block      History    Smoking Status  . Former Smoker  . Years: 2.00  . Types: Cigars  . Quit date: 03/08/2017  Smokeless Tobacco  . 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 No Known Allergies     Current Outpatient Prescriptions  Medication Sig Dispense Refill  . acetaminophen (TYLENOL) 325 MG tablet Take 2 tablets (650 mg total) by mouth every 6 (six) hours as needed for mild pain.    Marland Kitchen ALPRAZolam (XANAX) 0.25 MG tablet Take 1 tablet (0.25 mg total) by mouth 2 (two) times daily as needed for anxiety. 14 tablet 0  . amiodarone (PACERONE) 200 MG tablet TAKE 1 TABLET BY MOUTH TWICE DAILY FOR 1 MONTH, THEN ON 05/08/17 TAKE 1 TABLET DAILY 180 tablet 3  . atorvastatin (LIPITOR) 80 MG tablet Take 1 tablet (80 mg total) by mouth daily at 6 PM. 30 tablet 3  . brimonidine-timolol (COMBIGAN) 0.2-0.5 % ophthalmic solution Place 1 drop into both eyes 2 (two) times daily.     . Canagliflozin (INVOKANA) 300 MG TABS Take 300 mg by mouth daily.    . diphenhydrAMINE (BENADRYL) 25 MG tablet Take 50 mg by mouth at bedtime.    . folic acid (FOLVITE) 1 MG tablet Take 1 tablet (1 mg total) by mouth daily. 30 tablet 0  . furosemide (LASIX) 40 MG tablet Take 1 tablet (40 mg total) by mouth daily. 30 tablet 0  .  metolazone (ZAROXOLYN) 5 MG tablet Take 5 mg twice a week Mon and Thurs 30 tablet 6  . metoprolol succinate (TOPROL-XL) 50 MG 24 hr tablet Take 1 tablet (50 mg total) by mouth 2 (two) times daily. Take 50mg  twice daily 60 tablet 2  . Multiple Vitamin (MULTIVITAMIN WITH MINERALS) TABS tablet Take 1 tablet by mouth daily.    . Polyethylene Glycol 3350 (MIRALAX PO) Take 1 Dose by mouth every evening.    . potassium chloride SA (K-DUR,KLOR-CON) 20 MEQ tablet TAKE 2 TABLETS BY MOUTH DAILY 180 tablet 1  . PROAIR RESPICLICK 086 (90 Base) MCG/ACT AEPB Take 2 puffs by mouth as directed.  1  . thiamine 100 MG tablet Take 1 tablet (100 mg total) by  mouth daily. 30 tablet 0  . traMADol (ULTRAM) 50 MG tablet Take 1-2 tablets (50-100 mg total) by mouth every 4 (four) hours as needed for moderate pain. 40 tablet 0  . XARELTO 20 MG TABS tablet Take 1 tablet by mouth daily.  2   No current facility-administered medications for this visit.        Physical Exam: BP 133/80 (BP Location: Right Arm, Patient Position: Sitting, Cuff Size: Large)   Pulse (!) 110   Resp 16   Ht 6\' 2"  (1.88 m)   Wt 293 lb (132.9 kg)   SpO2 94% Comment: ON RA  BMI 37.62 kg/m   General appearance: alert, cooperative and appears older than stated age Neurologic: intact Heart: irregularly irregular rhythm Lungs: diminished breath sounds bibasilar Abdomen: soft, non-tender; bowel sounds normal; no masses,  no organomegaly Extremities: edema Significant lower extremity edema both ankles with tight skin and weeping Wound: Sternum is stable and well healed vein harvest sites are also healed without obvious infection   Diagnostic Studies & Laboratory data:     Recent Radiology Findings:   Dg Chest 2 View  Result Date: 04/29/2017 CLINICAL DATA:  67 year old male with history of CABG on 03/22/2017. Afternoon exhaustion. EXAM: CHEST  2 VIEW COMPARISON:  Chest x-ray 04/02/2017. FINDINGS: No acute consolidative airspace disease. Small left pleural effusion, similar to the prior examination. No pneumothorax. No suspicious appearing pulmonary nodules or masses. Heart size is mildly enlarged. Upper mediastinal contours are within normal limits. Status post median sternotomy for CABG. IMPRESSION: 1. Stable small left pleural effusion. 2. Mild cardiomegaly. Electronically Signed   By: Vinnie Langton M.D.   On: 04/29/2017 14:42      Recent Lab Findings: Lab Results  Component Value Date   WBC 7.3 04/21/2017   HGB 10.7 (L) 04/21/2017   HCT 32.6 (L) 04/21/2017   PLT 352 04/21/2017   GLUCOSE 104 (H) 04/21/2017   CHOL 190 03/18/2017   TRIG 331 (H) 03/18/2017   HDL 29  (L) 03/18/2017   LDLCALC 95 03/18/2017   ALT 64 (H) 03/24/2017   AST 40 03/24/2017   NA 139 04/21/2017   K 4.2 04/21/2017   CL 92 (L) 04/21/2017   CREATININE 1.15 04/21/2017   BUN 29 (H) 04/21/2017   CO2 32 (H) 04/21/2017   TSH 3.390 04/21/2017   INR 1.4 (H) 04/21/2017   HGBA1C 6.0 (H) 03/18/2017      Assessment / Plan:      Patient remains fluid overloaded, I resumed his Lasix cardiology had renewed his potassium which he was taking twice a day but without Lasix. He scheduled to have a cardioversion next week, he's currently been maintained on Xarelto Following his cardioversion will consider starting cardiac rehabilitation.  [name] MD      [address] [place] [address] Office [phone]   Beeper [phone]  04/29/2017 3:17 PM

## 2017-05-04 ENCOUNTER — Ambulatory Visit (HOSPITAL_COMMUNITY): Payer: Medicare Other | Admitting: Certified Registered Nurse Anesthetist

## 2017-05-04 ENCOUNTER — Other Ambulatory Visit: Payer: Self-pay | Admitting: Physician Assistant

## 2017-05-04 ENCOUNTER — Encounter (HOSPITAL_COMMUNITY): Payer: Self-pay | Admitting: *Deleted

## 2017-05-04 ENCOUNTER — Ambulatory Visit (HOSPITAL_COMMUNITY)
Admission: RE | Admit: 2017-05-04 | Discharge: 2017-05-04 | Disposition: A | Discharge status: Home / Self Care | Payer: Medicare Other | Source: Ambulatory Visit | Attending: Cardiovascular Disease | Admitting: Cardiovascular Disease

## 2017-05-04 ENCOUNTER — Encounter (HOSPITAL_COMMUNITY)
Admission: RE | Disposition: A | Discharge status: Home / Self Care | Payer: Self-pay | Source: Ambulatory Visit | Attending: Cardiovascular Disease

## 2017-05-04 DIAGNOSIS — Z79899 Other long term (current) drug therapy: Secondary | ICD-10-CM | POA: Diagnosis not present

## 2017-05-04 DIAGNOSIS — Z87891 Personal history of nicotine dependence: Secondary | ICD-10-CM | POA: Insufficient documentation

## 2017-05-04 DIAGNOSIS — I2510 Atherosclerotic heart disease of native coronary artery without angina pectoris: Secondary | ICD-10-CM | POA: Diagnosis not present

## 2017-05-04 DIAGNOSIS — I481 Persistent atrial fibrillation: Secondary | ICD-10-CM | POA: Diagnosis not present

## 2017-05-04 DIAGNOSIS — I4891 Unspecified atrial fibrillation: Secondary | ICD-10-CM | POA: Diagnosis not present

## 2017-05-04 DIAGNOSIS — I10 Essential (primary) hypertension: Secondary | ICD-10-CM | POA: Diagnosis not present

## 2017-05-04 DIAGNOSIS — Z7984 Long term (current) use of oral hypoglycemic drugs: Secondary | ICD-10-CM | POA: Insufficient documentation

## 2017-05-04 DIAGNOSIS — K219 Gastro-esophageal reflux disease without esophagitis: Secondary | ICD-10-CM | POA: Diagnosis not present

## 2017-05-04 DIAGNOSIS — Z6837 Body mass index (BMI) 37.0-37.9, adult: Secondary | ICD-10-CM | POA: Diagnosis not present

## 2017-05-04 DIAGNOSIS — G4733 Obstructive sleep apnea (adult) (pediatric): Secondary | ICD-10-CM | POA: Insufficient documentation

## 2017-05-04 DIAGNOSIS — E119 Type 2 diabetes mellitus without complications: Secondary | ICD-10-CM | POA: Diagnosis not present

## 2017-05-04 DIAGNOSIS — Z9989 Dependence on other enabling machines and devices: Secondary | ICD-10-CM | POA: Diagnosis not present

## 2017-05-04 DIAGNOSIS — I452 Bifascicular block: Secondary | ICD-10-CM | POA: Insufficient documentation

## 2017-05-04 DIAGNOSIS — I252 Old myocardial infarction: Secondary | ICD-10-CM | POA: Insufficient documentation

## 2017-05-04 DIAGNOSIS — Z951 Presence of aortocoronary bypass graft: Secondary | ICD-10-CM | POA: Insufficient documentation

## 2017-05-04 DIAGNOSIS — I4892 Unspecified atrial flutter: Secondary | ICD-10-CM | POA: Insufficient documentation

## 2017-05-04 HISTORY — PX: CARDIOVERSION: SHX1299

## 2017-05-04 SURGERY — CARDIOVERSION
Anesthesia: General

## 2017-05-04 MED ORDER — PROPOFOL 10 MG/ML IV BOLUS
INTRAVENOUS | Status: DC | PRN
  Administered 2017-05-04: 30 mg via INTRAVENOUS
  Administered 2017-05-04: 50 mg via INTRAVENOUS

## 2017-05-04 MED ORDER — SODIUM CHLORIDE 0.9 % IV SOLN
INTRAVENOUS | Status: DC
  Administered 2017-05-04: 14:00:00 via INTRAVENOUS

## 2017-05-04 MED ORDER — LIDOCAINE 2% (20 MG/ML) 5 ML SYRINGE
INTRAMUSCULAR | Status: DC | PRN
  Administered 2017-05-04: 40 mg via INTRAVENOUS

## 2017-05-04 NOTE — H&P (View-Only) (Signed)
04/21/2017 David Frost   July 02, 1951  [id number]  Primary Physician [name], MD Primary Cardiologist: Lorretta Harp MD FACP, [place], [place], [place]  HPI:  David Frost is a 66 y.o. male  severely overweight divorced Caucasian male father of 2 sons who I last saw 06/14/14. He is accompanied by his son [name] and wife [name]. He has a history of obesity, hypertension, chronic right bundle branch  block and symptoms compatible sleep apnea. He has obstructive sleep apnea on CPAP. He does admit to dietary indiscretion. He says that he loved bagels and salt. He had a Myoview stress test in 2007 which was low risk and a 2-D echo to and 10 that showed normal LV function. He had a non-STEMI in [place] 03/15/17 and was transferred to [facility] where he underwent cardiac catheterization revealing multivessel disease. He was then transferred to [facility] where he underwent coronary artery bypass grafting 6 by Dr. Servando Snare on 03/22/17. His postoperative course was uncomplicated. Soon after discharge she was remitted with A. fib with RVR from which she was symptomatic from and was rate controlled, placed on amiodarone and oral anticoagulation.   Current Meds  Medication Sig  . acetaminophen (TYLENOL) 325 MG tablet Take 2 tablets (650 mg total) by mouth every 6 (six) hours as needed for mild pain.  Marland Kitchen ALPRAZolam (XANAX) 0.25 MG tablet Take 1 tablet (0.25 mg total) by mouth 2 (two) times daily as needed for anxiety.  Marland Kitchen amiodarone (PACERONE) 200 MG tablet TAKE 1 TABLET BY MOUTH TWICE DAILY FOR 1 MONTH, THEN ON 05/08/17 TAKE 1 TABLET DAILY  . atorvastatin (LIPITOR) 80 MG tablet Take 1 tablet (80 mg total) by mouth daily at 6 PM.  . brimonidine-timolol (COMBIGAN) 0.2-0.5 % ophthalmic solution Place 1 drop into both eyes 2 (two) times daily.   . Canagliflozin (INVOKANA) 300 MG TABS Take 300 mg by mouth daily.  . cephALEXin (KEFLEX) 500 MG capsule Take 1 capsule (500 mg total)  by mouth 3 (three) times daily with meals.  . diphenhydrAMINE (BENADRYL) 25 MG tablet Take 50 mg by mouth at bedtime.  . folic acid (FOLVITE) 1 MG tablet Take 1 tablet (1 mg total) by mouth daily.  . furosemide (LASIX) 40 MG tablet Take 1 tablet (40 mg total) by mouth daily.  . metolazone (ZAROXOLYN) 5 MG tablet Take 5 mg twice a week Mon and Thurs  . metoprolol succinate (TOPROL-XL) 50 MG 24 hr tablet Take 1 tablet (50 mg total) by mouth 2 (two) times daily. Take 50mg  twice daily  . Multiple Vitamin (MULTIVITAMIN WITH MINERALS) TABS tablet Take 1 tablet by mouth daily.  . Polyethylene Glycol 3350 (MIRALAX PO) Take 1 Dose by mouth every evening.  . potassium chloride SA (K-DUR,KLOR-CON) 20 MEQ tablet TAKE 2 TABLETS BY MOUTH DAILY  . PROAIR RESPICLICK 570 (90 Base) MCG/ACT AEPB Take 2 puffs by mouth as directed.  . thiamine 100 MG tablet Take 1 tablet (100 mg total) by mouth daily.  . traMADol (ULTRAM) 50 MG tablet Take 1-2 tablets (50-100 mg total) by mouth every 4 (four) hours as needed for moderate pain.  Marland Kitchen XARELTO 20 MG TABS tablet Take 1 tablet by mouth daily.     Allergies  Allergen Reactions  . No Known Allergies     Social History   Social History  . Marital status: Legally Separated    Spouse name: N/A  . Number of children: N/A  . Years of  education: N/A   Occupational History  . Not on file.   Social History Main Topics  . Smoking status: Former Smoker    Years: 2.00    Types: Cigars    Quit date: 03/08/2017  . Smokeless tobacco: Never Used  . 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 Drug use: No  . Sexual activity: Not on file   Other Topics Concern  . Not on file   Social History Narrative  . No narrative on file     Review of Systems: General: negative for chills, fever, night sweats or weight changes.  Cardiovascular: negative for chest pain, dyspnea on exertion, edema, orthopnea, palpitations, paroxysmal nocturnal dyspnea or  shortness of breath Dermatological: negative for rash Respiratory: negative for cough or wheezing Urologic: negative for hematuria Abdominal: negative for nausea, vomiting, diarrhea, bright red blood per rectum, melena, or hematemesis Neurologic: negative for visual changes, syncope, or dizziness All other systems reviewed and are otherwise negative except as noted above.    Blood pressure 102/76, pulse (!) 107, height 6\' 2"  (1.88 m), weight 298 lb 12.8 oz (135.5 kg).  General appearance: alert and no distress Neck: no adenopathy, no carotid bruit, no JVD, supple, symmetrical, trachea midline and thyroid not enlarged, symmetric, no tenderness/mass/nodules Lungs: clear to auscultation bilaterally Heart: irregularly irregular rhythm Extremities: 1-2+ peripheral edema  EKG atrial fibrillation with a ventricular response of 107, right bundle branch block and left anterior fascicular block. I personally reviewed this EKG.  ASSESSMENT AND PLAN:   Essential hypertension History of essential hypertension blood pressure measured today at 102/76. He is on Toprol 50 mg by mouth twice a day for blood pressure and rate control.  Obstructive sleep apnea History of obstructive sleep apnea on CPAP  CAD, multiple vessel History of CAD status post non-STEMI 03/15/17 at [place]. He was transferred to [facility] for cardiac catheterization revealing multivessel disease and ultimately was transferred to [facility] where he underwent coronary artery bypass grafting 6 by Dr. Servando Snare on 03/22/17. His postoperative course was unremarkable.  Atrial fibrillation with RVR (HCC) History of persistent AF post bypass grafting on amiodarone load and Xarelto  as well as high-dose beta blocker for rate control. His ventricular response today is about 100. He is somewhat symptomatic. I'm going to arrange for him to undergo outpatient DC cardioversion sometime in the next 2-3  weeks.      Lorretta Harp MD FACP,FACC,FAHA, [facility] 04/21/2017 2:58 PM

## 2017-05-04 NOTE — Anesthesia Postprocedure Evaluation (Signed)
Anesthesia Post Note  Patient: David Frost  Procedure(s) Performed: Procedure(s) (LRB): CARDIOVERSION (N/A)     Patient location during evaluation: PACU Anesthesia Type: General Level of consciousness: awake and alert Pain management: pain level controlled Vital Signs Assessment: post-procedure vital signs reviewed and stable Respiratory status: spontaneous breathing, nonlabored ventilation and respiratory function stable Cardiovascular status: blood pressure returned to baseline and stable Postop Assessment: no signs of nausea or vomiting Anesthetic complications: no    Last Vitals:  Vitals:   05/04/17 1343 05/04/17 1350  BP:  131/70  Pulse: 71 71  Resp: (!) 25 (!) 21  Temp:  36.4 C  SpO2: 100% 99%    Last Pain:  Vitals:   05/04/17 1350  TempSrc: Oral                 , A.

## 2017-05-04 NOTE — CV Procedure (Signed)
Electrical Cardioversion Procedure Note VIAN FLUEGEL [id number] [birth year]  Procedure: Electrical Cardioversion Indications:  Atrial Flutter  Procedure Details Consent: Risks of procedure as well as the alternatives and risks of each were explained to the (patient/caregiver).  Consent for procedure obtained. Time Out: Verified patient identification, verified procedure, site/side was marked, verified correct patient position, special equipment/implants available, medications/allergies/relevent history reviewed, required imaging and test results available.  Performed  Patient placed on cardiac monitor, pulse oximetry, supplemental oxygen as necessary.  Sedation given: Propofol Pacer pads placed anterior and posterior chest.  Cardioverted 1 time(s).  Cardioverted at 150J.  Evaluation Findings: Post procedure EKG shows: NSR Complications: None Patient did tolerate procedure well.   Skeet Latch, MD 05/04/2017, 1:44 PM

## 2017-05-04 NOTE — Transfer of Care (Signed)
Immediate Anesthesia Transfer of Care Note  Patient: David Frost  Procedure(s) Performed: Procedure(s): CARDIOVERSION (N/A)  Patient Location: Endoscopy Unit  Anesthesia Type:General  Level of Consciousness: awake, alert , oriented, patient cooperative and responds to stimulation  Airway & Oxygen Therapy: Patient Spontanous Breathing and Patient connected to nasal cannula oxygen  Post-op Assessment: Report given to RN, Post -op Vital signs reviewed and stable and Patient moving all extremities X 4  Post vital signs: Reviewed and stable  Last Vitals:  Vitals:   05/04/17 1342 05/04/17 1343  BP: 140/77   Pulse: 73 71  Resp: (!) 22 (!) 25  Temp:    SpO2: 94% 100%    Last Pain:  Vitals:   05/04/17 1240  TempSrc: Oral         Complications: No apparent anesthesia complications

## 2017-05-04 NOTE — Anesthesia Preprocedure Evaluation (Signed)
Anesthesia Evaluation  Patient identified by MRN, date of birth, ID band Patient awake    Reviewed: Allergy & Precautions, NPO status , Patient's Chart, lab work & pertinent test results, reviewed documented beta blocker date and time   History of Anesthesia Complications Negative for: history of anesthetic complications  Airway Mallampati: III  TM Distance: >3 FB Neck ROM: Full    Dental no notable dental hx. (+) Dental Advisory Given   Pulmonary shortness of breath and with exertion, sleep apnea and Continuous Positive Airway Pressure Ventilation , former smoker,    Pulmonary exam normal        Cardiovascular hypertension, Pt. on medications and Pt. on home beta blockers + angina with exertion + CAD and + Past MI  negative cardio ROS  + dysrhythmias Atrial Fibrillation  Rhythm:Irregular Rate:Normal     Neuro/Psych PSYCHIATRIC DISORDERS Anxiety negative neurological ROS     GI/Hepatic Neg liver ROS, GERD  Medicated and Controlled,  Endo/Other  diabetes, Well Controlled, Type 2, Oral Hypoglycemic AgentsMorbid obesity  Renal/GU negative Renal ROS     Musculoskeletal negative musculoskeletal ROS (+)   Abdominal   Peds  Hematology Xarelto- last dose today   Anesthesia Other Findings Day of surgery medications reviewed with the patient.  Reproductive/Obstetrics                             Anesthesia Physical  Anesthesia Plan  ASA: III  Anesthesia Plan: General   Post-op Pain Management:    Induction: Intravenous  PONV Risk Score and Plan: 3 and Ondansetron and Treatment may vary due to age or medical condition  Airway Management Planned: Mask  Additional Equipment:   Intra-op Plan:   Post-operative Plan:   Informed Consent: I have reviewed the patients History and Physical, chart, labs and discussed the procedure including the risks, benefits and alternatives for the proposed  anesthesia with the patient or authorized representative who has indicated his/her understanding and acceptance.   Dental advisory given  Plan Discussed with: CRNA, Anesthesiologist and Surgeon  Anesthesia Plan Comments:         Anesthesia Quick Evaluation

## 2017-05-04 NOTE — Interval H&P Note (Signed)
History and Physical Interval Note:  05/04/2017 1:31 PM  David Frost  has presented today for surgery, with the diagnosis of A-FIB  The various methods of treatment have been discussed with the patient and family. After consideration of risks, benefits and other options for treatment, the patient has consented to  Procedure(s): CARDIOVERSION (N/A) as a surgical intervention .  The patient's history has been reviewed, patient examined, no change in status, stable for surgery.  I have reviewed the patient's chart and labs.  Questions were answered to the patient's satisfaction.     Skeet Latch, MD

## 2017-05-04 NOTE — Telephone Encounter (Signed)
Please review for refill, thanks ! 

## 2017-05-05 ENCOUNTER — Encounter (HOSPITAL_COMMUNITY): Payer: Self-pay | Admitting: Cardiovascular Disease

## 2017-05-09 NOTE — Progress Notes (Signed)
Cardiology Office Note    Date:  05/11/2017   ID:  JOSH NICOLOSI, DOB May 01, 1951, MRN [id number]  PCP:  [name], MD  Cardiologist: Dr. Gwenlyn Found    Chief Complaint  Patient presents with  . Follow-up    s/p DCCV    History of Present Illness:    David Frost is a 66 y.o. male with past medical history of CAD (s/p CABG in 03/2017 with LIMA-LAD, reverse SVG-intermediate, sequential reverse SVG-OM1-dLCx, and sequential reverse SVG-PDA-PLB), PAF, HTN, OSA, known RBBB, HTN, and HLD who presents to the office today for follow-up of his recent cardioversion.   He was examined by Dr. Gwenlyn Found on 04/21/2017 and noted to be in atrial fibrillation with a HR of 107. He noted associated dyspnea and palpitations and reported good compliance with Xarelto, therefore an outpatient DCCV was recommended. This was performed by Dr. Oval Linsey on 05/04/2017 with successful conversion to NSR following 150J shock.   In talking with the patient today, he reports doing well since his recent cardioversion. He denies any recurrent palpitations. Reports having a productive cough with phlegm and baseline dyspnea on exertion. Reports he was previously on medication for GERD and the symptoms improved with this but the medication was stopped during his hospitalization and symptoms resumed, therefore he requests to restart a PPI. He has been checking his weights daily at home and weight has been stable between 290 - 291 lbs. He self-discontinued his Lasix last week but has continued on Metolazone and Potassium. He was advised against self-discontinuing medications in the future without making his medical providers aware. He has increased his activity and is able to walk a few hundred feet without any anginal symptoms. He voices multiple questions about when he can start cardiac rehab as he is anxious to improve his activity level.   He reports good compliance with Xarelto and denies missing any recent doses. No evidence of  hematuria, melena, or hematochezia.   Past Medical History:  Diagnosis Date  . Anxiety   . Coronary artery disease    a. s/p CABG in 03/2017 with LIMA-LAD, reverse SVG-intermediate, sequential reverse SVG-OM1-dLCx, and sequential reverse SVG-PDA-PLB  . Diabetes mellitus without complication ([place])    type 2  . Dietary indiscretion   . Dyspnea   . GERD (gastroesophageal reflux disease)   . Hypertension   . Obesity   . Obstructive sleep apnea   . PAF (paroxysmal atrial fibrillation) ([place])    a. s/p DCCV in 04/2017  . Right bundle branch block     Past Surgical History:  Procedure Laterality Date  . CARDIOVERSION N/A 05/04/2017   Procedure: CARDIOVERSION;  Surgeon: Skeet Latch, MD;  Location[facility];  Service: Cardiovascular;  Laterality: N/A;  . CORONARY ARTERY BYPASS GRAFT N/A 03/22/2017   Procedure: CORONARY ARTERY BYPASS GRAFTING (CABG) x6, using the left internal mammary artery and the greater saphenous vein harvested endoscopically from the right thigh and calf and the left thigh. -LIMA to LAD -SVG to RAMUS INTERMEDIATE -SEQ SVG to OM1 and DISTAL LEFT CIRCUMFLEX -SEQ SVG to PDA and PLVB;  Surgeon: [name], MD;  Location: [facility];  Service: Open Heart Surgery;  Laterality  . DOPPLER ECHOCARDIOGRAPHY  2010  . NM MYOVIEW LTD  2007  . TEE WITHOUT CARDIOVERSION N/A 03/22/2017   Procedure: TRANSESOPHAGEAL ECHOCARDIOGRAM (TEE);  Surgeon: [name], MD;  Location: [facility];  Service: Open Heart Surgery;  Laterality: N/A;  . TONSILLECTOMY      Current  Medications: Outpatient Medications Prior to Visit  Medication Sig Dispense Refill  . acetaminophen (TYLENOL) 325 MG tablet Take 2 tablets (650 mg total) by mouth every 6 (six) hours as needed for mild pain.    Marland Kitchen ALPRAZolam (XANAX) 0.25 MG tablet Take 1 tablet (0.25 mg total) by mouth 2 (two) times daily as needed for anxiety. 14 tablet 0  . atorvastatin (LIPITOR) 80 MG tablet Take 1 tablet (80 mg total) by mouth  daily at 6 PM. 30 tablet 3  . brimonidine-timolol (COMBIGAN) 0.2-0.5 % ophthalmic solution Place 1 drop into both eyes 2 (two) times daily.     . Canagliflozin (INVOKANA) 300 MG TABS Take 300 mg by mouth daily.    . diphenhydrAMINE (BENADRYL) 25 MG tablet Take 50 mg by mouth at bedtime.    . Multiple Vitamin (MULTIVITAMIN WITH MINERALS) TABS tablet Take 1 tablet by mouth daily.    . Polyethylene Glycol 3350 (MIRALAX PO) Take 1 Dose by mouth every evening.    Marland Kitchen PROAIR RESPICLICK 517 (90 Base) MCG/ACT AEPB Take 2 puffs by mouth as directed.  1  . thiamine 100 MG tablet Take 1 tablet (100 mg total) by mouth daily. 30 tablet 0  . traMADol (ULTRAM) 50 MG tablet Take 1-2 tablets (50-100 mg total) by mouth every 4 (four) hours as needed for moderate pain. 40 tablet 0  . metolazone (ZAROXOLYN) 5 MG tablet Take 5 mg twice a week Mon and Thurs 30 tablet 6  . metoprolol succinate (TOPROL-XL) 50 MG 24 hr tablet Take 1 tablet (50 mg total) by mouth 2 (two) times daily. Take 50mg  twice daily 60 tablet 2  . XARELTO 20 MG TABS tablet Take 1 tablet by mouth daily.  2  . amiodarone (PACERONE) 200 MG tablet TAKE 1 TABLET BY MOUTH TWICE DAILY FOR 1 MONTH, THEN ON 05/08/17 TAKE 1 TABLET DAILY 180 tablet 3  . amiodarone (PACERONE) 200 MG tablet TAKE 1 TABLET BY MOUTH TWICE DAILY FOR 1 MONTH, THEN ON 05/08/17 TAKE 1 TABLET DAILY 60 tablet 0  . furosemide (LASIX) 40 MG tablet Take 1 tablet (40 mg total) by mouth daily. 30 tablet 0  . potassium chloride SA (K-DUR,KLOR-CON) 20 MEQ tablet TAKE 2 TABLETS BY MOUTH DAILY 180 tablet 1   No facility-administered medications prior to visit.      Allergies:   Patient has no known allergies.   Social History   Social History  . Marital status: Legally Separated    Spouse name: N/A  . Number of children: N/A  . Years of education: N/A   Social History Main Topics  . Smoking status: Former Smoker    Years: 2.00    Types: Cigars    Quit date: 03/08/2017  . Smokeless  tobacco: Never Used  . 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 Drug use: No  . Sexual activity: Not Asked   Other Topics Concern  . None   Social History Narrative  . None     Family History:  The patient's family history includes Heart attack in his father.   Review of Systems:   Please see the history of present illness.     General:  No chills, fever, night sweats or weight changes. Positive for fatigue (improving). Cardiovascular:  No chest pain, dyspnea on exertion, edema, orthopnea, palpitations, paroxysmal nocturnal dyspnea. Dermatological: No rash, lesions/masses Respiratory: No cough, dyspnea Urologic: No hematuria, dysuria Abdominal:   No nausea, vomiting,  diarrhea, bright red blood per rectum, melena, or hematemesis Neurologic:  No visual changes, wkns, changes in mental status.  All other systems reviewed and are otherwise negative except as noted above.   Physical Exam:    VS:  BP 120/72   Pulse 62   Ht 6\' 2"  (1.88 m)   Wt 294 lb (133.4 kg)   BMI 37.75 kg/m    General: Well developed, well nourished Caucasian male appearing in no acute distress. Head: Normocephalic, atraumatic, sclera non-icteric, no xanthomas, nares are without discharge.  Neck: No carotid bruits. JVD not elevated.  Lungs: Respirations regular and unlabored, without wheezes or rales.  Heart: Regular rate and rhythm. No S3 or S4.  No murmur, no rubs, or gallops appreciated. Abdomen: Soft, non-tender, non-distended with normoactive bowel sounds. No hepatomegaly. No rebound/guarding. No obvious abdominal masses. Msk:  Strength and tone appear normal for age. No joint deformities or effusions. Extremities: No clubbing or cyanosis. No lower extremity edema.  Distal pedal pulses are 2+ bilaterally. Neuro: Alert and oriented X 3. Moves all extremities spontaneously. No focal deficits noted. Psych:  Responds to questions appropriately with a normal affect. Skin: No rashes  or lesions noted  Wt Readings from Last 3 Encounters:  05/11/17 294 lb (133.4 kg)  04/29/17 293 lb (132.9 kg)  04/21/17 298 lb 12.8 oz (135.5 kg)     Studies/Labs Reviewed:   EKG:  EKG is ordered today.  The ekg ordered today demonstrates NSR with 1st degree AV Block, HR 63. Known RBBB.   Recent Labs: 03/23/2017: Magnesium 2.2 03/24/2017: ALT 64 04/21/2017: BUN 29; Creatinine, Ser 1.15; Hemoglobin 10.7; Platelets 352; Potassium 4.2; Sodium 139; TSH 3.390   Lipid Panel    Component Value Date/Time   CHOL 190 03/18/2017 0503   TRIG 331 (H) 03/18/2017 0503   HDL 29 (L) 03/18/2017 0503   CHOLHDL 6.6 03/18/2017 0503   VLDL 66 (H) 03/18/2017 0503   [place] 95 03/18/2017 0503    Additional studies/ records that were reviewed today include:   DCCV: 05/04/2017 Procedure Details Consent: Risks of procedure as well as the alternatives and risks of each were explained to the (patient/caregiver).  Consent for procedure obtained. Time Out: Verified patient identification, verified procedure, site/side was marked, verified correct patient position, special equipment/implants available, medications/allergies/relevent history reviewed, required imaging and test results available.  Performed  Patient placed on cardiac monitor, pulse oximetry, supplemental oxygen as necessary.  Sedation given: Propofol Pacer pads placed anterior and posterior chest.  Cardioverted 1 time(s).  Cardioverted at 150J.  Evaluation Findings: Post procedure EKG shows: NSR Complications: None Patient did tolerate procedure well.   Assessment:    1. Persistent atrial fibrillation ([place])   2. Current use of long term anticoagulation   3. Coronary artery disease involving native coronary artery of native heart without angina pectoris   4. Medication management   5. Chronic right-sided heart failure ([place])   6. Essential hypertension   7. Hyperlipidemia LDL goal <70   8. Obstructive sleep apnea      Plan:    In order of problems listed above:  1. Persistent Atrial Fibrillation/ Long-term Anticoagulation - had persistent atrial fibrillation occurring following recent CABG and underwent successful DCCV on 05/04/2017. - He is still maintaining normal sinus rhythm by examination today. He denies any recurrent palpitations or dyspnea on exertion. - continue Amiodarone 200mg  daily and Toprol-XL 50mg  BID. Can likely discontinue Amiodarone once further out from DCCV. Continue Xarelto for anticoagulation as he denies  any evidence of active bleeding.   2. CAD - s/p CABG in 03/2017 with LIMA-LAD, reverse SVG-intermediate, sequential reverse SVG-OM1-dLCx, and sequential reverse SVG-PDA-PLB - continue BB and statin therapy. No ASA secondary to need for Xarelto. Cleared to start cardiac rehab.   3. Right-sided heart failure - EF 50-55% by TEE in 03/2017. - He was previously on Lasix following bypass but self-discontinued this due to feeling like he was becoming dehydrated. He does not appear volume overloaded by physical examination today. He has mistakenly continued on Metolazone and Potassium since stopping Lasix. Will recheck BMET today to assess K+ and kidney function. Will stop Metolazone. I reviewed with the patient that he can take Lasix as needed for weight gain > 3 lbs overnight or > 5 lbs in one week.   4. HTN - BP is well-controlled at 120/72 during today's visit. - continue current medication regimen.   5. HLD - Lipid Panel in 02/2017 showed total cholesterol of 190, HDL 29, and LDL 95. Started on Atorvastatin 80mg  daily with goal LDL < 70 with known CAD. Will need repeat FLP and LFT's when fasting.   6. OSA - continued compliance with CPAP encouraged.    Medication Adjustments/Labs and Tests Ordered: Current medicines are reviewed at length with the patient today.  Concerns regarding medicines are outlined above.  Medication changes, Labs and Tests ordered today are listed in the Patient  Instructions below. Patient Instructions  Medication Instructions:  Take furosemide 40mg  daily as needed. Take if daily weight is up 3 lbs in 1 day or 5 lbs in 1 week.  Stop metolazone  Start Protonix 40mg  daily If you need a refill on your cardiac medications before your next appointment, please call your pharmacy.  Labwork: BMET TODAY HERE IN OUR OFFICE AT [facility]  Follow-Up: Your physician wants you to follow-up in: November AS SCHEDULED WITH DR Gwenlyn Found.    Thank you for choosing [facility] at [employer] [facility], Erma Heritage, PA-C  05/11/2017 8:02 PM    [address], [place], [place]  [address] Phone: [phone]; Fax: [phone]  [address], [place], [place] [address] Phone: [phone]

## 2017-05-11 ENCOUNTER — Other Ambulatory Visit: Payer: Self-pay | Admitting: Student

## 2017-05-11 ENCOUNTER — Ambulatory Visit (INDEPENDENT_AMBULATORY_CARE_PROVIDER_SITE_OTHER): Payer: Medicare Other | Admitting: Student

## 2017-05-11 ENCOUNTER — Encounter: Payer: Self-pay | Admitting: Student

## 2017-05-11 VITALS — BP 120/72 | HR 62 | Ht 74.0 in | Wt 294.0 lb

## 2017-05-11 DIAGNOSIS — I10 Essential (primary) hypertension: Secondary | ICD-10-CM | POA: Diagnosis not present

## 2017-05-11 DIAGNOSIS — Z7901 Long term (current) use of anticoagulants: Secondary | ICD-10-CM | POA: Diagnosis not present

## 2017-05-11 DIAGNOSIS — G4733 Obstructive sleep apnea (adult) (pediatric): Secondary | ICD-10-CM | POA: Diagnosis not present

## 2017-05-11 DIAGNOSIS — I50812 Chronic right heart failure: Secondary | ICD-10-CM | POA: Diagnosis not present

## 2017-05-11 DIAGNOSIS — Z79899 Other long term (current) drug therapy: Secondary | ICD-10-CM

## 2017-05-11 DIAGNOSIS — I2510 Atherosclerotic heart disease of native coronary artery without angina pectoris: Secondary | ICD-10-CM | POA: Diagnosis not present

## 2017-05-11 DIAGNOSIS — E785 Hyperlipidemia, unspecified: Secondary | ICD-10-CM | POA: Diagnosis not present

## 2017-05-11 DIAGNOSIS — I4819 Other persistent atrial fibrillation: Secondary | ICD-10-CM

## 2017-05-11 DIAGNOSIS — I481 Persistent atrial fibrillation: Secondary | ICD-10-CM | POA: Diagnosis not present

## 2017-05-11 MED ORDER — PANTOPRAZOLE SODIUM 40 MG PO TBEC: 40.0000 mg | DELAYED_RELEASE_TABLET | Freq: Every day | ORAL | 3 refills | Status: DC

## 2017-05-11 MED ORDER — FUROSEMIDE 40 MG PO TABS: 40.0000 mg | ORAL_TABLET | Freq: Every day | ORAL | 1 refills | Status: DC | PRN

## 2017-05-11 MED ORDER — METOPROLOL SUCCINATE ER 50 MG PO TB24: 50.0000 mg | ORAL_TABLET | Freq: Two times a day (BID) | ORAL | 2 refills | Status: DC

## 2017-05-11 MED ORDER — XARELTO 20 MG PO TABS: 20.0000 mg | ORAL_TABLET | Freq: Every day | ORAL | 2 refills | Status: DC

## 2017-05-11 MED ORDER — NITROGLYCERIN 0.4 MG SL SUBL: 0.4000 mg | SUBLINGUAL_TABLET | SUBLINGUAL | 3 refills | Status: DC | PRN

## 2017-05-11 NOTE — Patient Instructions (Signed)
Medication Instructions:  Take furosemide 40mg  daily as needed. Take if daily weight is up 3 lbs in 1 day or 5 lbs in 1 week.  Stop metolazone  Start Protonix 40mg  daily If you need a refill on your cardiac medications before your next appointment, please call your pharmacy.  Labwork: BMET TODAY HERE IN OUR OFFICE AT [facility]  Follow-Up: Your physician wants you to follow-up in: November AS SCHEDULED WITH DR Gwenlyn Found.    Thank you for choosing [facility] at [facility]!!

## 2017-05-12 ENCOUNTER — Telehealth: Payer: Self-pay | Admitting: Student

## 2017-05-12 LAB — BASIC METABOLIC PANEL
BUN / CREAT RATIO: 20 (ref 10–24)
BUN: 25 mg/dL (ref 8–27)
CHLORIDE: 90 mmol/L — AB (ref 96–106)
CO2: 27 mmol/L (ref 20–29)
Calcium: 9.2 mg/dL (ref 8.6–10.2)
Creatinine, Ser: 1.24 mg/dL (ref 0.76–1.27)
GFR calc non Af Amer: 59 mL/min/{1.73_m2} — ABNORMAL LOW (ref >59)
GFR, EST AFRICAN AMERICAN: 69 mL/min/{1.73_m2} (ref >59)
GLUCOSE: 99 mg/dL (ref 65–99)
Potassium: 4.5 mmol/L (ref 3.5–5.2)
SODIUM: 136 mmol/L (ref 134–144)

## 2017-05-12 MED ORDER — OMEPRAZOLE 40 MG PO CPDR: 40.0000 mg | DELAYED_RELEASE_CAPSULE | Freq: Every day | ORAL | 11 refills | Status: DC

## 2017-05-12 NOTE — Telephone Encounter (Signed)
Patient calling, states that he saw [place] yesterday. Patient was trying to describe a medication that he previously took and [place] prescribed a medication. Patient states that he has the name of the medications.

## 2017-05-12 NOTE — Telephone Encounter (Signed)
Pt notified Rx sent 

## 2017-05-12 NOTE — Telephone Encounter (Signed)
Spoke with patient and he had taken Omeprazole 40 mg daily and was given Protonix 40 mg daily. He would prefer going back on the Omeprazole secondary to knowing it worked well for him Will forward to [place] for review

## 2017-05-12 NOTE — Telephone Encounter (Signed)
Reviewed his current medication list. He is fine to switch to Omeprazole 40mg  daily as this would not interact with any of his current medications. Thank you!

## 2017-05-13 ENCOUNTER — Telehealth: Payer: Self-pay | Admitting: Pharmacist Clinician (PhC)/ Clinical Pharmacy Specialist

## 2017-05-13 NOTE — Telephone Encounter (Signed)
Received request for switching ARB from valsartan due to recall.  Note from [facility] indicated that it was written by surgeon, but requested cardiology to make the change.  Per July hospital d/c note, valsartan was discontinued when he was discharged.

## 2017-05-17 ENCOUNTER — Other Ambulatory Visit: Payer: Self-pay | Admitting: Student

## 2017-05-17 DIAGNOSIS — I872 Venous insufficiency (chronic) (peripheral): Secondary | ICD-10-CM | POA: Diagnosis not present

## 2017-05-17 DIAGNOSIS — L820 Inflamed seborrheic keratosis: Secondary | ICD-10-CM | POA: Diagnosis not present

## 2017-05-17 DIAGNOSIS — L298 Other pruritus: Secondary | ICD-10-CM | POA: Diagnosis not present

## 2017-05-17 DIAGNOSIS — R208 Other disturbances of skin sensation: Secondary | ICD-10-CM | POA: Diagnosis not present

## 2017-05-17 DIAGNOSIS — L538 Other specified erythematous conditions: Secondary | ICD-10-CM | POA: Diagnosis not present

## 2017-05-21 ENCOUNTER — Telehealth: Payer: Self-pay | Admitting: Cardiovascular Disease

## 2017-05-21 MED ORDER — METOPROLOL SUCCINATE ER 50 MG PO TB24: 50.0000 mg | ORAL_TABLET | Freq: Every day | ORAL | 2 refills | Status: DC

## 2017-05-21 NOTE — Telephone Encounter (Signed)
°  New Prob   Requesting to speak to someone regarding switching one of his medications. Please call.

## 2017-05-21 NOTE — Telephone Encounter (Signed)
Spoke with pt, aware of David Frost's recommendations. He was instructed to take the metoprolol at bedtime. He will call if problems continue.

## 2017-05-21 NOTE — Telephone Encounter (Signed)
Spoke with pt, he is taking metoprolol 50 mg twice daily, he reports he is struggling to get through the day. He feels washed out with no energy. He feels like he is still in rhythm. He would like to try a different medication to help with the atrial fib and rate control. Will forward to brittney strader pa to review and advise

## 2017-05-21 NOTE — Telephone Encounter (Signed)
Now that he is back in sinus rhythm, he can reduce this to 50mg  once daily. It is a 24-hour extended release medication so this should help. If symptoms persist, can further reduce to 25mg  daily in the future.

## 2017-05-25 ENCOUNTER — Inpatient Hospital Stay (HOSPITAL_COMMUNITY): Admission: RE | Admit: 2017-05-25 | Payer: Medicare Other | Source: Ambulatory Visit

## 2017-05-31 ENCOUNTER — Encounter (HOSPITAL_COMMUNITY): Payer: Medicare Other

## 2017-06-02 ENCOUNTER — Encounter (HOSPITAL_COMMUNITY): Payer: Medicare Other

## 2017-06-04 ENCOUNTER — Encounter (HOSPITAL_COMMUNITY): Payer: Medicare Other

## 2017-06-07 ENCOUNTER — Encounter (HOSPITAL_COMMUNITY): Payer: Medicare Other

## 2017-06-07 ENCOUNTER — Telehealth (HOSPITAL_COMMUNITY): Payer: Self-pay | Admitting: Pharmacist

## 2017-06-07 DIAGNOSIS — I10 Essential (primary) hypertension: Secondary | ICD-10-CM | POA: Diagnosis not present

## 2017-06-07 DIAGNOSIS — I480 Paroxysmal atrial fibrillation: Secondary | ICD-10-CM | POA: Diagnosis not present

## 2017-06-07 DIAGNOSIS — R05 Cough: Secondary | ICD-10-CM | POA: Diagnosis not present

## 2017-06-07 DIAGNOSIS — J0100 Acute maxillary sinusitis, unspecified: Secondary | ICD-10-CM | POA: Diagnosis not present

## 2017-06-07 DIAGNOSIS — E1151 Type 2 diabetes mellitus with diabetic peripheral angiopathy without gangrene: Secondary | ICD-10-CM | POA: Diagnosis not present

## 2017-06-07 DIAGNOSIS — Z6838 Body mass index (BMI) 38.0-38.9, adult: Secondary | ICD-10-CM | POA: Diagnosis not present

## 2017-06-07 DIAGNOSIS — G4733 Obstructive sleep apnea (adult) (pediatric): Secondary | ICD-10-CM | POA: Diagnosis not present

## 2017-06-08 NOTE — Telephone Encounter (Signed)
Cardiac Rehab Medication Review by a Pharmacist  Does the patient  feel that his/her medications are working for him/her?  yes  Has the patient been experiencing any side effects to the medications prescribed?  No, once metoprolol was switched to once daily instead of twice a day he has had no side effects and much more energy.   Does the patient measure his/her own blood pressure or blood glucose at home?  Yes, he checks his weights and blood glucose daily and keeps a log. Checks blood pressure periodically but does not keep track of this.   Does the patient have any problems obtaining medications due to transportation or finances?   Yes--the xarelto and invokana are expensive and he is in the donut hole currently, but they have been able to obtain refills or samples from the physicians office.   Understanding of regimen: excellent Understanding of indications: excellent Potential of compliance: excellent    Pharmacist comments: David Frost is a 66 y.o. male who seemed in good spirits and health over the phone this morning. He stated that his partner, David Frost, takes care of his medications and would be the person to talk to about his medications and how he takes them. He gave me permission to call her and discuss. David Frost was well aware of all medications that David Frost takes and was able to tell me all doses and scheduling of medications. Nitroglycerin tablets have not been used or opened, but aware to obtain new bottle if used. She also noted that he was given some antibiotics (cefdinir 300mg  BID for 10 days) recently for a cold and is also taking hydromet 1 tsp q8hr for cough for a cold. The only concern she voiced was about the cost of the xarelto and invokana, but to date they have been able to keep him on schedule through refills and samples. I feel that overall there is an excellent understanding of the medication regimen and that David Frost has excellent adherence.   Jalene Mullet,  Pharm.D. PGY1 Pharmacy Resident 06/08/2017 9:33 AM Main Pharmacy: [phone]

## 2017-06-09 ENCOUNTER — Encounter (HOSPITAL_COMMUNITY): Payer: Medicare Other

## 2017-06-10 ENCOUNTER — Encounter: Payer: Medicare Other | Admitting: Cardiothoracic Surgery

## 2017-06-10 ENCOUNTER — Encounter (HOSPITAL_COMMUNITY)
Admission: RE | Admit: 2017-06-10 | Discharge: 2017-06-10 | Disposition: A | Discharge status: Home / Self Care | Payer: Medicare Other | Source: Ambulatory Visit | Attending: Cardiovascular Disease | Admitting: Cardiovascular Disease

## 2017-06-10 ENCOUNTER — Encounter (HOSPITAL_COMMUNITY): Payer: Self-pay

## 2017-06-10 VITALS — BP 134/76 | HR 61 | Ht 71.75 in | Wt 295.0 lb

## 2017-06-10 DIAGNOSIS — Z48812 Encounter for surgical aftercare following surgery on the circulatory system: Secondary | ICD-10-CM | POA: Insufficient documentation

## 2017-06-10 DIAGNOSIS — Z951 Presence of aortocoronary bypass graft: Secondary | ICD-10-CM | POA: Insufficient documentation

## 2017-06-10 NOTE — Progress Notes (Signed)
David Frost 66 y.o. male DOB: 11/13/1950 MRN: [id number]      Nutrition Note  1. S/P CABG x 6    Past Medical History:  Diagnosis Date  . Anxiety   . Coronary artery disease    a. s/p CABG in 03/2017 with LIMA-LAD, reverse SVG-intermediate, sequential reverse SVG-OM1-dLCx, and sequential reverse SVG-PDA-PLB  . Diabetes mellitus without complication ([place])    type 2  . Dietary indiscretion   . Dyspnea   . GERD (gastroesophageal reflux disease)   . Hypertension   . Obesity   . Obstructive sleep apnea   . PAF (paroxysmal atrial fibrillation) ([place])    a. s/p DCCV in 04/2017  . Right bundle branch block    Meds reviewed. Invokana noted  HT: Ht Readings from Last 1 Encounters:  06/10/17 5' 11.75" (1.822 m)    WT: Wt Readings from Last 3 Encounters:  06/10/17 294 lb 15.6 oz (133.8 kg)  05/11/17 294 lb (133.4 kg)  04/29/17 293 lb (132.9 kg)     BMI 40.3   Current tobacco use? No; Recently quit tobacco use 03/08/2017  Labs:  Lipid Panel     Component Value Date/Time   CHOL 190 03/18/2017 0503   TRIG 331 (H) 03/18/2017 0503   HDL 29 (L) 03/18/2017 0503   CHOLHDL 6.6 03/18/2017 0503   VLDL 66 (H) 03/18/2017 0503   LDLCALC 95 03/18/2017 0503    Lab Results  Component Value Date   HGBA1C 6.0 (H) 03/18/2017   CBG (last 3)  No results for input(s): GLUCAP in the last 72 hours.  Nutrition Note Spoke with pt. Nutrition plan and goals reviewed with pt. Pt is following Step 2 of the Therapeutic Lifestyle Changes diet. Pt wants to lose wt. Pt has been trying to lose wt by decreasing portion sizes and making healthier food choices. Wt loss tips reviewed.  Pt is diabetic. Last A1c indicates blood glucose well-controlled. Pt checks CBG's once daily. Fasting CBG's reportedly 105-112 mg/dL. Barriers to healthier eating include pt eats out for lunch and dinner daily and pt does not cook. Pt's girlfriend is supportive but does not cook either. Per discussion, pt is aware of ways to  make foods eaten out healthier and pt requests foods to be prepared in a heart healthier manner. Pt expressed understanding of the information reviewed. Pt aware of nutrition education classes offered and plans on attending nutrition classes.  Nutrition Diagnosis ? Food-and nutrition-related knowledge deficit related to lack of exposure to information as related to diagnosis of: ? CVD ? DM ? Obesity related to excessive energy intake as evidenced by a BMI of 40.3  Nutrition Intervention ? Pt's individual nutrition plan and goals reviewed with pt.  Nutrition Goal(s):  ? Pt to identify food quantities necessary to achieve weight loss of 6-24 lb (2.7-10.9 kg) at graduation from cardiac rehab. Goal wt loss of 30 lb desired.   Plan:  Pt to attend nutrition classes ? Nutrition I ? Nutrition II ? Portion Distortion ? Diabetes Blitz ? Diabetes Q & A Will provide client-centered nutrition education as part of interdisciplinary care.   Monitor and evaluate progress toward nutrition goal with team.  Derek [place], M.Ed, RD, LDN, CDE 06/10/2017 3:46 PM

## 2017-06-10 NOTE — Progress Notes (Signed)
Cardiac Individual Treatment Plan  Patient Details  Name: David Frost MRN: [id number] Date of Birth: Aug 04, 1951 Referring Provider:     CARDIAC REHAB PHASE II ORIENTATION from 06/10/2017 in [place]  Referring Provider  Quay Burow MD      Initial Encounter Date:    CARDIAC REHAB PHASE II ORIENTATION from 06/10/2017 in [facility]  Date  06/10/17  Referring Provider  Quay Burow MD      Visit Diagnosis: S/P CABG x 6  Patient's Home Medications on Admission:  Current Outpatient Prescriptions:  .  acetaminophen (TYLENOL) 325 MG tablet, Take 2 tablets (650 mg total) by mouth every 6 (six) hours as needed for mild pain., Disp: , Rfl:  .  ALPRAZolam (XANAX) 0.25 MG tablet, Take 1 tablet (0.25 mg total) by mouth 2 (two) times daily as needed for anxiety., Disp: 14 tablet, Rfl: 0 .  amiodarone (PACERONE) 200 MG tablet, Take 1 tablet by mouth daily., Disp: , Rfl: 0 .  atorvastatin (LIPITOR) 80 MG tablet, Take 1 tablet (80 mg total) by mouth daily at 6 PM., Disp: 30 tablet, Rfl: 3 .  brimonidine-timolol (COMBIGAN) 0.2-0.5 % ophthalmic solution, Place 1 drop into both eyes 2 (two) times daily. , Disp: , Rfl:  .  Canagliflozin (INVOKANA) 300 MG TABS, Take 300 mg by mouth daily., Disp: , Rfl:  .  diphenhydrAMINE (BENADRYL) 25 MG tablet, Take 50 mg by mouth at bedtime., Disp: , Rfl:  .  furosemide (LASIX) 40 MG tablet, Take 1 tablet (40 mg total) by mouth daily as needed. As needed for weight gain of 3 lbs in 1 day or 5 lbs in 1 week (Patient taking differently: Take 40 mg by mouth every other day. Every other day), Disp: 30 tablet, Rfl: 1 .  metoprolol succinate (TOPROL-XL) 50 MG 24 hr tablet, Take 1 tablet (50 mg total) by mouth daily., Disp: 60 tablet, Rfl: 2 .  Multiple Vitamin (MULTIVITAMIN WITH MINERALS) TABS tablet, Take 1 tablet by mouth daily., Disp: , Rfl:  .  nitroGLYCERIN (NITROSTAT) 0.4 MG SL tablet, Place 1  tablet (0.4 mg total) under the tongue every 5 (five) minutes as needed for chest pain., Disp: 25 tablet, Rfl: 3 .  omeprazole (PRILOSEC) 40 MG capsule, Take 1 capsule (40 mg total) by mouth daily., Disp: 30 capsule, Rfl: 11 .  Polyethylene Glycol 3350 (MIRALAX PO), Take 1 Dose by mouth every other day. , Disp: , Rfl:  .  PROAIR RESPICLICK 902 (90 Base) MCG/ACT AEPB, Take 2 puffs by mouth as directed., Disp: , Rfl: 1 .  thiamine 100 MG tablet, Take 1 tablet (100 mg total) by mouth daily., Disp: 30 tablet, Rfl: 0 .  traMADol (ULTRAM) 50 MG tablet, Take 1-2 tablets (50-100 mg total) by mouth every 4 (four) hours as needed for moderate pain. (Patient not taking: Reported on 06/08/2017), Disp: 40 tablet, Rfl: 0 .  XARELTO 20 MG TABS tablet, Take 1 tablet (20 mg total) by mouth daily., Disp: 30 tablet, Rfl: 2  Past Medical History: Past Medical History:  Diagnosis Date  . Anxiety   . Coronary artery disease    a. s/p CABG in 03/2017 with LIMA-LAD, reverse SVG-intermediate, sequential reverse SVG-OM1-dLCx, and sequential reverse SVG-PDA-PLB  . Diabetes mellitus without complication ([place])    type 2  . Dietary indiscretion   . Dyspnea   . GERD (gastroesophageal reflux disease)   . Hypertension   . Obesity   .  Obstructive sleep apnea   . PAF (paroxysmal atrial fibrillation) ([place])    a. s/p DCCV in 04/2017  . Right bundle branch block     Tobacco Use: History  Smoking Status  . Former Smoker  . Years: 2.00  . Types: Cigars  . Quit date: 03/08/2017  Smokeless Tobacco  . Never Used    Labs: Recent Review Flowsheet Data    Labs for ITP Cardiac and Pulmonary Rehab Latest Ref Rng & Units 03/22/2017 03/22/2017 03/22/2017 03/22/2017 03/23/2017   Cholestrol 0 - 200 mg/dL - - - - -   LDLCALC 0 - 99 mg/dL - - - - -   HDL >40 mg/dL - - - - -   Trlycerides <150 mg/dL - - - - -   Hemoglobin A1c 4.8 - 5.6 % - - - - -   PHART 7.350 - 7.450 7.494(H) 7.438 7.414 - -   PCO2ART 32.0 - 48.0 mmHg 38.9 47.2  50.8(H) - -   HCO3 20.0 - 28.0 mmol/L 29.9(H) 31.6(H) 32.5(H) - -   TCO2 0 - 100 mmol/L 31 33 34 29 27   O2SAT % 99.0 98.0 97.0 - -      Capillary Blood Glucose: Lab Results  Component Value Date   GLUCAP 109 (H) 04/04/2017   GLUCAP 140 (H) 04/04/2017   GLUCAP 123 (H) 04/03/2017   GLUCAP 182 (H) 04/03/2017   GLUCAP 142 (H) 04/03/2017     Exercise Target Goals: Date: 06/10/17  Exercise Program Goal: Individual exercise prescription set with THRR, safety & activity barriers. Participant demonstrates ability to understand and report RPE using BORG scale, to self-measure pulse accurately, and to acknowledge the importance of the exercise prescription.  Exercise Prescription Goal: Starting with aerobic activity 30 plus minutes a day, 3 days per week for initial exercise prescription. Provide home exercise prescription and guidelines that participant acknowledges understanding prior to discharge.  Activity Barriers & Risk Stratification:     Activity Barriers & Cardiac Risk Stratification - 06/10/17 1546      Activity Barriers & Cardiac Risk Stratification   Activity Barriers Other (comment);Shortness of Breath;Muscular Weakness;Deconditioning   Comments B  knee pain   Cardiac Risk Stratification High      6 Minute Walk:     6 Minute Walk    Row Name 06/10/17 1427         6 Minute Walk   Phase Initial     Distance 1254 feet     Walk Time 6 minutes     # of Rest Breaks 0     MPH 2.4     METS 2.2     RPE 13     VO2 Peak 7.7     Symptoms No     Resting HR 61 bpm     Resting BP 134/76     Resting Oxygen Saturation  96 %     Exercise Oxygen Saturation  during 6 min walk 95 %     Max Ex. HR 89 bpm     Max Ex. BP 154/82     2 Minute Post BP 142/60        Oxygen Initial Assessment:   Oxygen Re-Evaluation:   Oxygen Discharge (Final Oxygen Re-Evaluation):   Initial Exercise Prescription:     Initial Exercise Prescription - 06/10/17 1400      Date of  Initial Exercise RX and Referring Provider   Date 06/10/17   Referring Provider Quay Burow MD  Treadmill   MPH 2.3   Grade 0   Minutes 10   METs 2.76     Bike   Level 0.5   Minutes 10   METs 1.7     NuStep   Level 3   SPM 80   Minutes 10   METs 2     Prescription Details   Frequency (times per week) 3   Duration Progress to 30 minutes of continuous aerobic without signs/symptoms of physical distress     Intensity   THRR 40-80% of Max Heartrate 61-122   Ratings of Perceived Exertion 11-15   Perceived Dyspnea 0-4     Progression   Progression Continue progressive overload as per policy without signs/symptoms or physical distress.     Resistance Training   Training Prescription Yes   Weight 3lbs   Reps 10-15      Perform Capillary Blood Glucose checks as needed.  Exercise Prescription Changes:   Exercise Comments:   Exercise Goals and Review:      Exercise Goals    Row Name 06/10/17 1404 06/10/17 1431           Exercise Goals   Increase Physical Activity Yes  -      Intervention Provide advice, education, support and counseling about physical activity/exercise needs.;Develop an individualized exercise prescription for aerobic and resistive training based on initial evaluation findings, risk stratification, comorbidities and participant's personal goals.  -      Expected Outcomes Achievement of increased cardiorespiratory fitness and enhanced flexibility, muscular endurance and strength shown through measurements of functional capacity and personal statement of participant.  -      Increase Strength and Stamina Yes -  get back to riding bike      Intervention Provide advice, education, support and counseling about physical activity/exercise needs.;Develop an individualized exercise prescription for aerobic and resistive training based on initial evaluation findings, risk stratification, comorbidities and participant's personal goals.  -       Expected Outcomes Achievement of increased cardiorespiratory fitness and enhanced flexibility, muscular endurance and strength shown through measurements of functional capacity and personal statement of participant.  -      Able to understand and use rate of perceived exertion (RPE) scale Yes  -      Intervention Provide education and explanation on how to use RPE scale  -      Expected Outcomes Short Term: Able to use RPE daily in rehab to express subjective intensity level;Long Term:  Able to use RPE to guide intensity level when exercising independently  -      Knowledge and understanding of Target Heart Rate Range (THRR) Yes  -      Intervention Provide education and explanation of THRR including how the numbers were predicted and where they are located for reference  -      Expected Outcomes Short Term: Able to state/look up THRR;Long Term: Able to use THRR to govern intensity when exercising independently;Short Term: Able to use daily as guideline for intensity in rehab  -      Able to check pulse independently Yes  -      Intervention Provide education and demonstration on how to check pulse in carotid and radial arteries.;Review the importance of being able to check your own pulse for safety during independent exercise  -      Expected Outcomes Short Term: Able to explain why pulse checking is important during independent exercise;Long Term: Able to check pulse independently and  accurately  -      Understanding of Exercise Prescription Yes  -      Intervention Provide education, explanation, and written materials on patient's individual exercise prescription  -      Expected Outcomes Short Term: Able to explain program exercise prescription;Long Term: Able to explain home exercise prescription to exercise independently  -         Exercise Goals Re-Evaluation :    Discharge Exercise Prescription (Final Exercise Prescription Changes):   Nutrition:  Target Goals: Understanding of  nutrition guidelines, daily intake of sodium 1500mg , cholesterol 200mg , calories 30% from fat and 7% or less from saturated fats, daily to have 5 or more servings of fruits and vegetables.  Biometrics:     Pre Biometrics - 06/10/17 1544      Pre Biometrics   Height 5' 11.75" (1.822 m)   Weight 294 lb 15.6 oz (133.8 kg)   Waist Circumference 53 inches   Hip Circumference 49 inches   Waist to Hip Ratio 1.08 %   BMI (Calculated) 40.31   Triceps Skinfold 25 mm   % Body Fat 40 %   Grip Strength 38 kg   Flexibility 9.5 in   Single Leg Stand 2.55 seconds       Nutrition Therapy Plan and Nutrition Goals:     Nutrition Therapy & Goals - 06/10/17 1553      Nutrition Therapy   Diet Carb Modified, Therapeutic Lifestyle Changes     Personal Nutrition Goals   Nutrition Goal Wt loss of 1-2 lb/week to a wt loss goal of 6-24 lb while in Cardiac Rehab. Goal wt loss is 30 lb.      Intervention Plan   Intervention Prescribe, educate and counsel regarding individualized specific dietary modifications aiming towards targeted core components such as weight, hypertension, lipid management, diabetes, heart failure and other comorbidities.   Expected Outcomes Short Term Goal: Understand basic principles of dietary content, such as calories, fat, sodium, cholesterol and nutrients.;Long Term Goal: Adherence to prescribed nutrition plan.      Nutrition Discharge: Nutrition Scores:     Nutrition Assessments - 06/10/17 1553      MEDFICTS Scores   Pre Score 9      Nutrition Goals Re-Evaluation:   Nutrition Goals Re-Evaluation:   Nutrition Goals Discharge (Final Nutrition Goals Re-Evaluation):   Psychosocial: Target Goals: Acknowledge presence or absence of significant depression and/or stress, maximize coping skills, provide positive support system. Participant is able to verbalize types and ability to use techniques and skills needed for reducing stress and depression.  Initial  Review & Psychosocial Screening:     Initial Psych Review & Screening - 06/10/17 1534      Initial Review   Current issues with None Identified     Family Dynamics   Good Support System? Yes     Barriers   Psychosocial barriers to participate in program There are no identifiable barriers or psychosocial needs.     Screening Interventions   Interventions Encouraged to exercise      Quality of Life Scores:     Quality of Life - 06/10/17 1435      Quality of Life Scores   Health/Function Pre 23.47 %   Socioeconomic Pre 28.75 %   Psych/Spiritual Pre 28.07 %   Family Pre 28.8 %   GLOBAL Pre 26.36 %      PHQ-9: Recent Review Flowsheet Data    There is no flowsheet data to display.  Interpretation of Total Score  Total Score Depression Severity:  1-4 = Minimal depression, 5-9 = Mild depression, 10-14 = Moderate depression, 15-19 = Moderately severe depression, 20-27 = Severe depression   Psychosocial Evaluation and Intervention:   Psychosocial Re-Evaluation:   Psychosocial Discharge (Final Psychosocial Re-Evaluation):   Vocational Rehabilitation: Provide vocational rehab assistance to qualifying candidates.   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 Participant will state understanding/return demonstration of topics presented.  Learning Barriers/Preferences:     Learning Barriers/Preferences - 06/10/17 1108      Learning Barriers/Preferences   Learning Barriers Sight   Learning Preferences Pictoral;Video;Skilled Demonstration      Education Topics: Count Your Pulse:  -Group instruction provided by verbal instruction,  demonstration, patient participation and written materials to support subject.  Instructors address importance of being able to find your pulse and how to count your pulse when at home without a heart monitor.  Patients get hands on experience counting their pulse with staff help and individually.   Heart Attack, Angina, and Risk Factor Modification:  -Group instruction provided by verbal instruction, video, and written materials to support subject.  Instructors address signs and symptoms of angina and heart attacks.    Also discuss risk factors for heart disease and how to make changes to improve heart health risk factors.   Functional Fitness:  -Group instruction provided by verbal instruction, demonstration, patient participation, and written materials to support subject.  Instructors address safety measures for doing things around the house.  Discuss how to get up and down off the floor, how to pick things up properly, how to safely get out of a chair without assistance, and balance training.   Meditation and Mindfulness:  -Group instruction provided by verbal instruction, patient participation, and written materials to support subject.  Instructor addresses importance of mindfulness and meditation practice to help reduce stress and improve awareness.  Instructor also leads participants through a meditation exercise.    Stretching for Flexibility and Mobility:  -Group instruction provided by verbal instruction, patient participation, and written materials to support subject.  Instructors lead participants through series of stretches that are designed to increase flexibility thus improving mobility.  These stretches are additional exercise for major muscle groups that are typically performed during regular warm up and cool down.   Hands Only CPR:  -Group verbal, video, and participation provides a basic overview of AHA guidelines for community CPR. Role-play of emergencies allow participants  the opportunity to practice calling for help and chest compression technique with discussion of AED use.   Hypertension: -Group verbal and written instruction that provides a basic overview of hypertension including the most recent diagnostic guidelines, risk factor reduction with self-care instructions and medication management.    Nutrition I class: Heart Healthy Eating:  -Group instruction provided by PowerPoint slides, verbal discussion, and written materials to support subject matter. The instructor gives an explanation and review of the Therapeutic Lifestyle Changes diet recommendations, which includes a discussion on lipid goals, dietary fat, sodium, fiber, plant stanol/sterol esters, sugar, and the components of a well-balanced, healthy diet.   Nutrition II class: Lifestyle Skills:  -Group instruction provided by PowerPoint slides,  verbal discussion, and written materials to support subject matter. The instructor gives an explanation and review of label reading, grocery shopping for heart health, heart healthy recipe modifications, and ways to make healthier choices when eating out.   Diabetes Question & Answer:  -Group instruction provided by PowerPoint slides, verbal discussion, and written materials to support subject matter. The instructor gives an explanation and review of diabetes co-morbidities, pre- and post-prandial blood glucose goals, pre-exercise blood glucose goals, signs, symptoms, and treatment of hypoglycemia and hyperglycemia, and foot care basics.   Diabetes Blitz:  -Group instruction provided by PowerPoint slides, verbal discussion, and written materials to support subject matter. The instructor gives an explanation and review of the physiology behind type 1 and type 2 diabetes, diabetes medications and rational behind using different medications, pre- and post-prandial blood glucose recommendations and Hemoglobin A1c goals, diabetes diet, and exercise including blood  glucose guidelines for exercising safely.    Portion Distortion:  -Group instruction provided by PowerPoint slides, verbal discussion, written materials, and food models to support subject matter. The instructor gives an explanation of serving size versus portion size, changes in portions sizes over the last 20 years, and what consists of a serving from each food group.   Stress Management:  -Group instruction provided by verbal instruction, video, and written materials to support subject matter.  Instructors review role of stress in heart disease and how to cope with stress positively.     Exercising on Your Own:  -Group instruction provided by verbal instruction, power point, and written materials to support subject.  Instructors discuss benefits of exercise, components of exercise, frequency and intensity of exercise, and end points for exercise.  Also discuss use of nitroglycerin and activating EMS.  Review options of places to exercise outside of rehab.  Review guidelines for sex with heart disease.   Cardiac Drugs I:  -Group instruction provided by verbal instruction and written materials to support subject.  Instructor reviews cardiac drug classes: antiplatelets, anticoagulants, beta blockers, and statins.  Instructor discusses reasons, side effects, and lifestyle considerations for each drug class.   Cardiac Drugs II:  -Group instruction provided by verbal instruction and written materials to support subject.  Instructor reviews cardiac drug classes: angiotensin converting enzyme inhibitors (ACE-I), angiotensin II receptor blockers (ARBs), nitrates, and calcium channel blockers.  Instructor discusses reasons, side effects, and lifestyle considerations for each drug class.   Anatomy and Physiology of the Circulatory System:  Group verbal and written instruction and models provide basic cardiac anatomy and physiology, with the coronary electrical and arterial systems. Review of: AMI,  Angina, Valve disease, Heart Failure, Peripheral Artery Disease, Cardiac Arrhythmia, Pacemakers, and the ICD.   Other Education:  -Group or individual verbal, written, or video instructions that support the educational goals of the cardiac rehab program.   Knowledge Questionnaire Score:     Knowledge Questionnaire Score - 06/10/17 1426      Knowledge Questionnaire Score   Pre Score 21/24      Core Components/Risk Factors/Patient Goals at Admission:     Personal Goals and Risk Factors at Admission - 06/10/17 1547      Core Components/Risk Factors/Patient Goals on Admission   Intervention Weight Management: Develop a combined nutrition and exercise program designed to reach desired caloric intake, while maintaining appropriate intake of nutrient and fiber, sodium and fats, and appropriate energy expenditure required for the weight goal.;Weight Management: Provide education and appropriate resources to help participant work on and attain dietary goals.;Obesity: Provide education  and appropriate resources to help participant work on and attain dietary goals.;Weight Management/Obesity: Establish reasonable short term and long term weight goals.   Admit Weight 294 lb 15.6 oz (133.8 kg)   Goal Weight: Short Term 284 lb (128.8 kg)   Goal Weight: Long Term 264 lb (119.7 kg)   Expected Outcomes Short Term: Continue to assess and modify interventions until short term weight is achieved;Long Term: Adherence to nutrition and physical activity/exercise program aimed toward attainment of established weight goal;Weight Maintenance: Understanding of the daily nutrition guidelines, which includes 25-35% calories from fat, 7% or less cal from saturated fats, less than 200mg  cholesterol, less than 1.5gm of sodium, & 5 or more servings of fruits and vegetables daily;Weight Loss: Understanding of general recommendations for a balanced deficit meal plan, which promotes 1-2 lb weight loss per week and includes a  negative energy balance of [phone] kcal/d;Understanding recommendations for meals to include 15-35% energy as protein, 25-35% energy from fat, 35-60% energy from carbohydrates, less than 200mg  of dietary cholesterol, 20-35 gm of total fiber daily;Understanding of distribution of calorie intake throughout the day with the consumption of 4-5 meals/snacks   Tobacco Cessation Yes   Intervention Assist the participant in steps to quit. Provide individualized education and counseling about committing to Tobacco Cessation, relapse prevention, and pharmacological support that can be provided by physician.;Advice worker, assist with locating and accessing local/national Quit Smoking programs, and support quit date choice.   Expected Outcomes Short Term: Will demonstrate readiness to quit, by selecting a quit date.;Short Term: Will quit all tobacco product use, adhering to prevention of relapse plan.;Long Term: Complete abstinence from all tobacco products for at least 12 months from quit date.   Diabetes Yes   Intervention Provide education about signs/symptoms and action to take for hypo/hyperglycemia.;Provide education about proper nutrition, including hydration, and aerobic/resistive exercise prescription along with prescribed medications to achieve blood glucose in normal ranges: Fasting glucose 65-99 mg/dL   Expected Outcomes Short Term: Participant verbalizes understanding of the signs/symptoms and immediate care of hyper/hypoglycemia, proper foot care and importance of medication, aerobic/resistive exercise and nutrition plan for blood glucose control.;Long Term: Attainment of HbA1C < 7%.   Hypertension Yes   Intervention Provide education on lifestyle modifcations including regular physical activity/exercise, weight management, moderate sodium restriction and increased consumption of fresh fruit, vegetables, and low fat dairy, alcohol moderation, and smoking cessation.;Monitor prescription  use compliance.   Expected Outcomes Short Term: Continued assessment and intervention until BP is < 140/69mm HG in hypertensive participants. < 130/41mm HG in hypertensive participants with diabetes, heart failure or chronic kidney disease.;Long Term: Maintenance of blood pressure at goal levels.   Lipids Yes   Intervention Provide education and support for participant on nutrition & aerobic/resistive exercise along with prescribed medications to achieve LDL 70mg , HDL >40mg .   Expected Outcomes Short Term: Participant states understanding of desired cholesterol values and is compliant with medications prescribed. Participant is following exercise prescription and nutrition guidelines.;Long Term: Cholesterol controlled with medications as prescribed, with individualized exercise RX and with personalized nutrition plan. Value goals: LDL < 70mg , HDL > 40 mg.      Core Components/Risk Factors/Patient Goals Review:    Core Components/Risk Factors/Patient Goals at Discharge (Final Review):    ITP Comments:     ITP Comments    Row Name 06/10/17 1102           ITP Comments Dr Fransico Him, Medical Director  Comments: Wing attended orientation from 1330 to 1450 to review rules and guidelines for program. Completed 6 minute walk test, Intitial ITP, and exercise prescription.  VSS. Telemetry-Sinus Rhythm with a bundle branch block and a first degree heart block.  Asymptomatic.Steward says he has not smoked a cigar since his open heart surgery. Mukhtar says he is looking forward to participating in the program.Maria Venetia Maxon, RN,BSN 06/10/2017 4:10 PM

## 2017-06-11 ENCOUNTER — Encounter (HOSPITAL_COMMUNITY): Payer: Medicare Other

## 2017-06-14 ENCOUNTER — Encounter (HOSPITAL_COMMUNITY)
Admission: RE | Admit: 2017-06-14 | Discharge: 2017-06-14 | Disposition: A | Discharge status: Home / Self Care | Payer: Medicare Other | Source: Ambulatory Visit | Attending: Cardiovascular Disease | Admitting: Cardiovascular Disease

## 2017-06-14 ENCOUNTER — Encounter (HOSPITAL_COMMUNITY): Payer: Self-pay

## 2017-06-14 DIAGNOSIS — Z951 Presence of aortocoronary bypass graft: Secondary | ICD-10-CM | POA: Diagnosis not present

## 2017-06-14 DIAGNOSIS — Z48812 Encounter for surgical aftercare following surgery on the circulatory system: Secondary | ICD-10-CM | POA: Diagnosis not present

## 2017-06-14 LAB — GLUCOSE, CAPILLARY
GLUCOSE-CAPILLARY: 131 mg/dL — AB (ref 65–99)
Glucose-Capillary: 114 mg/dL — ABNORMAL HIGH (ref 65–99)

## 2017-06-14 NOTE — Progress Notes (Addendum)
Daily Session Note  Patient Details  Name: David Frost MRN: [id number] Date of Birth: August 31, 1951 Referring Provider:     CARDIAC REHAB PHASE II ORIENTATION from 06/10/2017 in [place]  Referring Provider  Quay Burow MD      Encounter Date: 06/14/2017  Check In:     Session Check In - 06/14/17 0913      Check-In   Location [facility]   Staff Present Dorna Bloom, MS, ACSM RCEP, Exercise Physiologist;Maria Whitaker, RN, Deland Pretty, MS, ACSM CEP, Exercise Physiologist;Joann Rion, RN, BSN   Supervising physician immediately available to respond to emergencies [facility] immediately available   Physician(s) Dr. Wynelle [place]   Medication changes reported     No   Fall or balance concerns reported    No   Tobacco Cessation No Change   Warm-up and Cool-down Performed as group-led instruction   Resistance Training Performed Yes   VAD Patient? No     Pain Assessment   Currently in Pain? No/denies   Multiple Pain Sites No      Capillary Blood Glucose: Results for orders placed or performed during the hospital encounter of 06/14/17 (from the past 24 hour(s))  Glucose, capillary     Status: Abnormal   Collection Time: 06/14/17  8:22 AM  Result Value Ref Range   Glucose-Capillary 131 (H) 65 - 99 mg/dL  Glucose, capillary     Status: Abnormal   Collection Time: 06/14/17  9:23 AM  Result Value Ref Range   Glucose-Capillary 114 (H) 65 - 99 mg/dL      History  Smoking Status  . Former Smoker  . Years: 2.00  . Types: Cigars  . Quit date: 03/08/2017  Smokeless Tobacco  . Never Used    Goals Met:  Exercise tolerated well  Goals Unmet:  Not Applicable  Comments: Pt started cardiac rehab today.  Pt tolerated light exercise without difficulty. VSS, telemetry-sinus rhythm, asymptomatic.  Medication list verbally reconciled, however unsure of all medications indicating his wife prepares medicine for him.  Pt  denies barriers to medicaiton compliance.  PSYCHOSOCIAL ASSESSMENT:  PHQ-0. Pt exhibits positive coping skills, hopeful outlook with supportive family. No psychosocial needs identified at this time, no psychosocial interventions necessary.    Pt enjoys boating at [employer] and spending time with his 5 grandchildren.  Pt goals for cardiac rehab are to increase strength/stamina and exercise tolerance.  Pt has personal 30 lb weight loss goal.  Pt states "I am embracing this".  Pt demonstrates eagerness to make necessary lifestyle modifications to decrease his overall CAD risk.  Pt congratulated on his recent daily cigar cessation and encouraged to maintain abstinence.  Pt oriented to exercise equipment and routine.    Understanding verbalized.   Dr. Fransico Him is Medical Director for Cardiac Rehab at [facility].

## 2017-06-16 ENCOUNTER — Encounter (HOSPITAL_COMMUNITY)
Admission: RE | Admit: 2017-06-16 | Discharge: 2017-06-16 | Disposition: A | Discharge status: Home / Self Care | Payer: Medicare Other | Source: Ambulatory Visit | Attending: Cardiovascular Disease | Admitting: Cardiovascular Disease

## 2017-06-16 DIAGNOSIS — Z951 Presence of aortocoronary bypass graft: Secondary | ICD-10-CM

## 2017-06-16 DIAGNOSIS — Z48812 Encounter for surgical aftercare following surgery on the circulatory system: Secondary | ICD-10-CM | POA: Diagnosis not present

## 2017-06-16 LAB — GLUCOSE, CAPILLARY: Glucose-Capillary: 114 mg/dL — ABNORMAL HIGH (ref 65–99)

## 2017-06-18 ENCOUNTER — Encounter (HOSPITAL_COMMUNITY)
Admission: RE | Admit: 2017-06-18 | Discharge: 2017-06-18 | Disposition: A | Discharge status: Home / Self Care | Payer: Medicare Other | Source: Ambulatory Visit | Attending: Cardiovascular Disease | Admitting: Cardiovascular Disease

## 2017-06-18 DIAGNOSIS — Z48812 Encounter for surgical aftercare following surgery on the circulatory system: Secondary | ICD-10-CM | POA: Diagnosis not present

## 2017-06-18 DIAGNOSIS — Z951 Presence of aortocoronary bypass graft: Secondary | ICD-10-CM

## 2017-06-18 LAB — GLUCOSE, CAPILLARY: GLUCOSE-CAPILLARY: 118 mg/dL — AB (ref 65–99)

## 2017-06-21 ENCOUNTER — Encounter (HOSPITAL_COMMUNITY): Payer: Medicare Other

## 2017-06-23 ENCOUNTER — Encounter (HOSPITAL_COMMUNITY)
Admission: RE | Admit: 2017-06-23 | Discharge: 2017-06-23 | Disposition: A | Discharge status: Home / Self Care | Payer: Medicare Other | Source: Ambulatory Visit | Attending: Cardiovascular Disease | Admitting: Cardiovascular Disease

## 2017-06-23 ENCOUNTER — Other Ambulatory Visit: Payer: Self-pay | Admitting: Physician Assistant

## 2017-06-23 DIAGNOSIS — Z951 Presence of aortocoronary bypass graft: Secondary | ICD-10-CM | POA: Insufficient documentation

## 2017-06-23 DIAGNOSIS — Z48812 Encounter for surgical aftercare following surgery on the circulatory system: Secondary | ICD-10-CM | POA: Insufficient documentation

## 2017-06-23 NOTE — Telephone Encounter (Signed)
Please review for refill. Thanks!  

## 2017-06-23 NOTE — Telephone Encounter (Signed)
Refill Request.  

## 2017-06-24 ENCOUNTER — Ambulatory Visit (INDEPENDENT_AMBULATORY_CARE_PROVIDER_SITE_OTHER): Payer: Medicare Other | Admitting: Cardiothoracic Surgery

## 2017-06-24 ENCOUNTER — Encounter: Payer: Self-pay | Admitting: Cardiothoracic Surgery

## 2017-06-24 VITALS — BP 137/68 | HR 57 | Ht 71.75 in | Wt 297.0 lb

## 2017-06-24 DIAGNOSIS — I2510 Atherosclerotic heart disease of native coronary artery without angina pectoris: Secondary | ICD-10-CM | POA: Diagnosis not present

## 2017-06-24 DIAGNOSIS — Z951 Presence of aortocoronary bypass graft: Secondary | ICD-10-CM

## 2017-06-24 NOTE — Progress Notes (Signed)
[address]       ,[place] [address]             [phone]      Rayner S Nason [place] Medical Record #[id number] Date of Birth: 05/30/1951  Referring: Pixie Casino, MD Primary Care: [name], MD  Chief Complaint:   POST OP FOLLOW UP 03/22/2017  OPERATIVE REPORT PREOPERATIVE DIAGNOSIS:  Recent non-ST-elevation myocardial infarction with 3-vessel coronary artery disease. POSTOPERATIVE DIAGNOSIS:  Recent non-ST-elevation myocardial infarction with 3-vessel coronary artery disease. SURGICAL PROCEDURE:  Coronary artery bypass grafting x6 with the left internal mammary to the left anterior descending coronary artery, reverse saphenous vein graft to the intermediate coronary, sequential reverse saphenous vein graft to the first obtuse marginal and distal circumflex, sequential reverse saphenous vein graft to the posterior descending and posterolateral branches of the right coronary artery with endoscopic vein harvesting of the right greater saphenous vein, thigh and calf, and left greater saphenous vein, thigh. SURGEON:  Lanelle Bal, MD  History of Present Illness:     Following surgery the patient was readmitted for atrial fibrillation, at that time he was started on anticoagulation and amiodarone. He now comes to the office in sinus rhythm after recent cardioversion. He is now currently involved in cardiac rehabilitation without recurrent angina.   Past Medical History:  Diagnosis Date  . Anxiety   . Coronary artery disease    a. s/p CABG in 03/2017 with LIMA-LAD, reverse SVG-intermediate, sequential reverse SVG-OM1-dLCx, and sequential reverse SVG-PDA-PLB  . Diabetes mellitus without complication ([place])    type 2  . Dietary indiscretion   . Dyspnea   . GERD (gastroesophageal reflux disease)   . Hypertension   . Obesity   . Obstructive sleep apnea   . PAF (paroxysmal atrial fibrillation) ([place])    a. s/p DCCV in 04/2017  . Right  bundle branch block      History  Smoking Status  . Former Smoker  . Years: 2.00  . Types: Cigars  . Quit date: 03/08/2017  Smokeless Tobacco  . Never Used    History  Alcohol Use No    Comment: pt states not drinking since CABG     No Known Allergies  Current Outpatient Prescriptions  Medication Sig Dispense Refill  . acetaminophen (TYLENOL) 325 MG tablet Take 2 tablets (650 mg total) by mouth every 6 (six) hours as needed for mild pain.    Marland Kitchen ALPRAZolam (XANAX) 0.25 MG tablet Take 1 tablet (0.25 mg total) by mouth 2 (two) times daily as needed for anxiety. 14 tablet 0  . amiodarone (PACERONE) 200 MG tablet Take 1 tablet by mouth daily.  0  . atorvastatin (LIPITOR) 80 MG tablet Take 1 tablet (80 mg total) by mouth daily at 6 PM. 30 tablet 3  . brimonidine-timolol (COMBIGAN) 0.2-0.5 % ophthalmic solution Place 1 drop into both eyes 2 (two) times daily.     . Canagliflozin (INVOKANA) 300 MG TABS Take 300 mg by mouth daily.    . diphenhydrAMINE (BENADRYL) 25 MG tablet Take 50 mg by mouth at bedtime.    . furosemide (LASIX) 40 MG tablet Take 1 tablet (40 mg total) by mouth daily as needed. As needed for weight gain of 3 lbs in 1 day or 5 lbs in 1 week (Patient taking differently: Take 40 mg by mouth every other day. Every other day) 30 tablet 1  . metoprolol succinate (TOPROL-XL) 50 MG 24 hr tablet Take 1  tablet (50 mg total) by mouth daily. 60 tablet 2  . Multiple Vitamin (MULTIVITAMIN WITH MINERALS) TABS tablet Take 1 tablet by mouth daily.    . nitroGLYCERIN (NITROSTAT) 0.4 MG SL tablet Place 1 tablet (0.4 mg total) under the tongue every 5 (five) minutes as needed for chest pain. 25 tablet 3  . omeprazole (PRILOSEC) 40 MG capsule Take 1 capsule (40 mg total) by mouth daily. 30 capsule 11  . Polyethylene Glycol 3350 (MIRALAX PO) Take 1 Dose by mouth every other day.     Marland Kitchen PROAIR RESPICLICK 846 (90 Base) MCG/ACT AEPB Take 2 puffs by mouth as directed.  1  . thiamine 100 MG tablet  Take 1 tablet (100 mg total) by mouth daily. 30 tablet 0  . XARELTO 20 MG TABS tablet Take 1 tablet (20 mg total) by mouth daily. 30 tablet 2   No current facility-administered medications for this visit.        Physical Exam: BP 137/68   Pulse (!) 57   Ht 5' 11.75" (1.822 m)   Wt 297 lb (134.7 kg)   SpO2 93%   BMI 40.56 kg/m   General appearance: alert, cooperative and appears older than stated age Neurologic: intact Heart: irregularly irregular rhythm Lungs: diminished breath sounds bibasilar Abdomen: soft, non-tender; bowel sounds normal; no masses,  no organomegaly Extremities: edema Significant lower extremity edema both ankles with tight skin and weeping Wound: Sternum is stable and well healed vein harvest sites are also healed without obvious infection   Diagnostic Studies & Laboratory data:     Recent Radiology Findings:   No results found.    Recent Lab Findings: Lab Results  Component Value Date   WBC 7.3 04/21/2017   HGB 10.7 (L) 04/21/2017   HCT 32.6 (L) 04/21/2017   PLT 352 04/21/2017   GLUCOSE 99 05/11/2017   CHOL 190 03/18/2017   TRIG 331 (H) 03/18/2017   HDL 29 (L) 03/18/2017   LDLCALC 95 03/18/2017   ALT 64 (H) 03/24/2017   AST 40 03/24/2017   NA 136 05/11/2017   K 4.5 05/11/2017   CL 90 (L) 05/11/2017   CREATININE 1.24 05/11/2017   BUN 25 05/11/2017   CO2 27 05/11/2017   TSH 3.390 04/21/2017   INR 1.4 (H) 04/21/2017   HGBA1C 6.0 (H) 03/18/2017      Assessment / Plan:      Doing well postoperatively , now in sinus rhythm. Follow-up with surgery as needed Wounds are healing well     [name] MD      [address] [place] [address] Office [phone]   Beeper [phone]  06/24/2017 3:33 PM

## 2017-06-25 ENCOUNTER — Encounter (HOSPITAL_COMMUNITY)
Admission: RE | Admit: 2017-06-25 | Discharge: 2017-06-25 | Disposition: A | Discharge status: Home / Self Care | Payer: Medicare Other | Source: Ambulatory Visit | Attending: Cardiovascular Disease | Admitting: Cardiovascular Disease

## 2017-06-25 DIAGNOSIS — Z951 Presence of aortocoronary bypass graft: Secondary | ICD-10-CM

## 2017-06-25 DIAGNOSIS — Z48812 Encounter for surgical aftercare following surgery on the circulatory system: Secondary | ICD-10-CM | POA: Diagnosis not present

## 2017-06-28 ENCOUNTER — Encounter (HOSPITAL_COMMUNITY)
Admission: RE | Admit: 2017-06-28 | Discharge: 2017-06-28 | Disposition: A | Discharge status: Home / Self Care | Payer: Medicare Other | Source: Ambulatory Visit | Attending: Cardiovascular Disease | Admitting: Cardiovascular Disease

## 2017-06-28 DIAGNOSIS — Z951 Presence of aortocoronary bypass graft: Secondary | ICD-10-CM

## 2017-06-28 DIAGNOSIS — Z48812 Encounter for surgical aftercare following surgery on the circulatory system: Secondary | ICD-10-CM | POA: Diagnosis not present

## 2017-06-28 LAB — GLUCOSE, CAPILLARY: GLUCOSE-CAPILLARY: 114 mg/dL — AB (ref 65–99)

## 2017-06-29 ENCOUNTER — Encounter (HOSPITAL_COMMUNITY): Payer: Self-pay

## 2017-06-29 NOTE — Progress Notes (Signed)
Cardiac Individual Treatment Plan  Patient Details  Name: David Frost MRN: [id number] Date of Birth: 1950-12-18 Referring Provider:     CARDIAC REHAB PHASE II ORIENTATION from 06/10/2017 in [place]  Referring Provider  Quay Burow MD      Initial Encounter Date:    CARDIAC REHAB PHASE II ORIENTATION from 06/10/2017 in [place]  Date  06/10/17  Referring Provider  Quay Burow MD      Visit Diagnosis: S/P CABG x 6  Patient's Home Medications on Admission:  Current Outpatient Prescriptions:  .  acetaminophen (TYLENOL) 325 MG tablet, Take 2 tablets (650 mg total) by mouth every 6 (six) hours as needed for mild pain., Disp: , Rfl:  .  ALPRAZolam (XANAX) 0.25 MG tablet, Take 1 tablet (0.25 mg total) by mouth 2 (two) times daily as needed for anxiety., Disp: 14 tablet, Rfl: 0 .  amiodarone (PACERONE) 200 MG tablet, Take 1 tablet by mouth daily., Disp: , Rfl: 0 .  atorvastatin (LIPITOR) 80 MG tablet, Take 1 tablet (80 mg total) by mouth daily at 6 PM., Disp: 30 tablet, Rfl: 3 .  brimonidine-timolol (COMBIGAN) 0.2-0.5 % ophthalmic solution, Place 1 drop into both eyes 2 (two) times daily. , Disp: , Rfl:  .  Canagliflozin (INVOKANA) 300 MG TABS, Take 300 mg by mouth daily., Disp: , Rfl:  .  diphenhydrAMINE (BENADRYL) 25 MG tablet, Take 50 mg by mouth at bedtime., Disp: , Rfl:  .  furosemide (LASIX) 40 MG tablet, Take 1 tablet (40 mg total) by mouth daily as needed. As needed for weight gain of 3 lbs in 1 day or 5 lbs in 1 week (Patient taking differently: Take 40 mg by mouth every other day. Every other day), Disp: 30 tablet, Rfl: 1 .  metoprolol succinate (TOPROL-XL) 50 MG 24 hr tablet, Take 1 tablet (50 mg total) by mouth daily., Disp: 60 tablet, Rfl: 2 .  Multiple Vitamin (MULTIVITAMIN WITH MINERALS) TABS tablet, Take 1 tablet by mouth daily., Disp: , Rfl:  .  nitroGLYCERIN (NITROSTAT) 0.4 MG SL tablet, Place 1  tablet (0.4 mg total) under the tongue every 5 (five) minutes as needed for chest pain., Disp: 25 tablet, Rfl: 3 .  omeprazole (PRILOSEC) 40 MG capsule, Take 1 capsule (40 mg total) by mouth daily., Disp: 30 capsule, Rfl: 11 .  Polyethylene Glycol 3350 (MIRALAX PO), Take 1 Dose by mouth every other day. , Disp: , Rfl:  .  PROAIR RESPICLICK 106 (90 Base) MCG/ACT AEPB, Take 2 puffs by mouth as directed., Disp: , Rfl: 1 .  thiamine 100 MG tablet, Take 1 tablet (100 mg total) by mouth daily., Disp: 30 tablet, Rfl: 0 .  XARELTO 20 MG TABS tablet, Take 1 tablet (20 mg total) by mouth daily., Disp: 30 tablet, Rfl: 2  Past Medical History: Past Medical History:  Diagnosis Date  . Anxiety   . Coronary artery disease    a. s/p CABG in 03/2017 with LIMA-LAD, reverse SVG-intermediate, sequential reverse SVG-OM1-dLCx, and sequential reverse SVG-PDA-PLB  . Diabetes mellitus without complication ([place])    type 2  . Dietary indiscretion   . Dyspnea   . GERD (gastroesophageal reflux disease)   . Hypertension   . Obesity   . Obstructive sleep apnea   . PAF (paroxysmal atrial fibrillation) ([place])    a. s/p DCCV in 04/2017  . Right bundle branch block     Tobacco Use: History  Smoking Status  .  Former Smoker  . Years: 2.00  . Types: Cigars  . Quit date: 03/08/2017  Smokeless Tobacco  . Never Used    Labs: Recent Review Flowsheet Data    Labs for ITP Cardiac and Pulmonary Rehab Latest Ref Rng & Units 03/22/2017 03/22/2017 03/22/2017 03/22/2017 03/23/2017   Cholestrol 0 - 200 mg/dL - - - - -   LDLCALC 0 - 99 mg/dL - - - - -   HDL >40 mg/dL - - - - -   Trlycerides <150 mg/dL - - - - -   Hemoglobin A1c 4.8 - 5.6 % - - - - -   PHART 7.350 - 7.450 7.494(H) 7.438 7.414 - -   PCO2ART 32.0 - 48.0 mmHg 38.9 47.2 50.8(H) - -   HCO3 20.0 - 28.0 mmol/L 29.9(H) 31.6(H) 32.5(H) - -   TCO2 0 - 100 mmol/L 31 33 34 29 27   O2SAT % 99.0 98.0 97.0 - -      Capillary Blood Glucose: Lab Results  Component Value Date    GLUCAP 114 (H) 06/28/2017   GLUCAP 118 (H) 06/18/2017   GLUCAP 114 (H) 06/16/2017   GLUCAP 114 (H) 06/14/2017   GLUCAP 131 (H) 06/14/2017     Exercise Target Goals:    Exercise Program Goal: Individual exercise prescription set with THRR, safety & activity barriers. Participant demonstrates ability to understand and report RPE using BORG scale, to self-measure pulse accurately, and to acknowledge the importance of the exercise prescription.  Exercise Prescription Goal: Starting with aerobic activity 30 plus minutes a day, 3 days per week for initial exercise prescription. Provide home exercise prescription and guidelines that participant acknowledges understanding prior to discharge.  Activity Barriers & Risk Stratification:     Activity Barriers & Cardiac Risk Stratification - 06/10/17 1546      Activity Barriers & Cardiac Risk Stratification   Activity Barriers Other (comment);Shortness of Breath;Muscular Weakness;Deconditioning   Comments B  knee pain   Cardiac Risk Stratification High      6 Minute Walk:     6 Minute Walk    Row Name 06/10/17 1427         6 Minute Walk   Phase Initial     Distance 1254 feet     Walk Time 6 minutes     # of Rest Breaks 0     MPH 2.4     METS 2.2     RPE 13     VO2 Peak 7.7     Symptoms No     Resting HR 61 bpm     Resting BP 134/76     Resting Oxygen Saturation  96 %     Exercise Oxygen Saturation  during 6 min walk 95 %     Max Ex. HR 89 bpm     Max Ex. BP 154/82     2 Minute Post BP 142/60        Oxygen Initial Assessment:   Oxygen Re-Evaluation:   Oxygen Discharge (Final Oxygen Re-Evaluation):   Initial Exercise Prescription:     Initial Exercise Prescription - 06/10/17 1400      Date of Initial Exercise RX and Referring Provider   Date 06/10/17   Referring Provider Quay Burow MD     Treadmill   MPH 2.3   Grade 0   Minutes 10   METs 2.76     Bike   Level 0.5   Minutes 10   METs 1.7  NuStep   Level 3   SPM 80   Minutes 10   METs 2     Prescription Details   Frequency (times per week) 3   Duration Progress to 30 minutes of continuous aerobic without signs/symptoms of physical distress     Intensity   THRR 40-80% of Max Heartrate 61-122   Ratings of Perceived Exertion 11-15   Perceived Dyspnea 0-4     Progression   Progression Continue progressive overload as per policy without signs/symptoms or physical distress.     Resistance Training   Training Prescription Yes   Weight 3lbs   Reps 10-15      Perform Capillary Blood Glucose checks as needed.  Exercise Prescription Changes:     Exercise Prescription Changes    Row Name 06/14/17 1249 06/29/17 1200           Response to Exercise   Blood Pressure (Admit) 146/70 126/82      Blood Pressure (Exercise) 140/84 136/70      Blood Pressure (Exit) 142/74 136/84      Heart Rate (Admit) 80 bpm 64 bpm      Heart Rate (Exercise) 95 bpm 84 bpm      Heart Rate (Exit) 67 bpm 64 bpm      Rating of Perceived Exertion (Exercise) 12 12      Symptoms fatigue, deconditioned, mild knee pain occ. knee pain with long periods of standing and walking      Comments pt was oriented to exercise equiment today exercise equiment was adjusted for pt comfort      Duration Continue with 30 min of aerobic exercise without signs/symptoms of physical distress. Continue with 30 min of aerobic exercise without signs/symptoms of physical distress.      Intensity THRR unchanged THRR unchanged        Progression   Progression Continue to progress workloads to maintain intensity without signs/symptoms of physical distress. Continue to progress workloads to maintain intensity without signs/symptoms of physical distress.      Average METs 1.9 1.9        Resistance Training   Training Prescription Yes Yes      Weight 3lbs 3lbs      Reps 10-15 10-15      Time 10 Minutes 10 Minutes        Bike   Level 0.5 0.5      Minutes 10 10       METs 1.7 1.7        NuStep   Level 3 4      SPM 80 80      Minutes 10 10      METs 2.1 2.1        Arm Ergometer   Level  - 1      Minutes  - 10      METs  - 1.9        Track   Laps  - 5      Minutes  - 10      METs  - 1.84        Home Exercise Plan   Plans to continue exercise at  - Home (comment)      Frequency  - Add 2 additional days to program exercise sessions.      Initial Home Exercises Provided  - 06/28/17         Exercise Comments:     Exercise Comments    Row Name 06/29/17 1247  Exercise Comments Reviewed METs and goals. Pt is tolerating exercise well. Pt is often times limited with exercise due to knee pain. Otherwise pt is responding well to exercise changes.          Exercise Goals and Review:     Exercise Goals    Row Name 06/10/17 1404 06/10/17 1431           Exercise Goals   Increase Physical Activity Yes  -      Intervention Provide advice, education, support and counseling about physical activity/exercise needs.;Develop an individualized exercise prescription for aerobic and resistive training based on initial evaluation findings, risk stratification, comorbidities and participant's personal goals.  -      Expected Outcomes Achievement of increased cardiorespiratory fitness and enhanced flexibility, muscular endurance and strength shown through measurements of functional capacity and personal statement of participant.  -      Increase Strength and Stamina Yes -  get back to riding bike      Intervention Provide advice, education, support and counseling about physical activity/exercise needs.;Develop an individualized exercise prescription for aerobic and resistive training based on initial evaluation findings, risk stratification, comorbidities and participant's personal goals.  -      Expected Outcomes Achievement of increased cardiorespiratory fitness and enhanced flexibility, muscular endurance and strength shown through  measurements of functional capacity and personal statement of participant.  -      Able to understand and use rate of perceived exertion (RPE) scale Yes  -      Intervention Provide education and explanation on how to use RPE scale  -      Expected Outcomes Short Term: Able to use RPE daily in rehab to express subjective intensity level;Long Term:  Able to use RPE to guide intensity level when exercising independently  -      Knowledge and understanding of Target Heart Rate Range (THRR) Yes  -      Intervention Provide education and explanation of THRR including how the numbers were predicted and where they are located for reference  -      Expected Outcomes Short Term: Able to state/look up THRR;Long Term: Able to use THRR to govern intensity when exercising independently;Short Term: Able to use daily as guideline for intensity in rehab  -      Able to check pulse independently Yes  -      Intervention Provide education and demonstration on how to check pulse in carotid and radial arteries.;Review the importance of being able to check your own pulse for safety during independent exercise  -      Expected Outcomes Short Term: Able to explain why pulse checking is important during independent exercise;Long Term: Able to check pulse independently and accurately  -      Understanding of Exercise Prescription Yes  -      Intervention Provide education, explanation, and written materials on patient's individual exercise prescription  -      Expected Outcomes Short Term: Able to explain program exercise prescription;Long Term: Able to explain home exercise prescription to exercise independently  -         Exercise Goals Re-Evaluation :     Exercise Goals Re-Evaluation    Row Name 06/29/17 1248             Exercise Goal Re-Evaluation   Exercise Goals Review Increase Physical Activity;Able to understand and use rate of perceived exertion (RPE) scale;Knowledge and understanding of Target Heart Rate  Range (THRR);Understanding of  Exercise Prescription;Increase Strength and Stamina;Able to check pulse independently       Comments Reviewed home exercise with pt today.  Pt plans to ride road bike for exercise, 2x/week.Discussed how to progress exercise safely. Reviewed THR, pulse, RPE, sign and symptoms, and when to call 911 or MD.  Also discussed weather considerations and indoor options.  Pt voiced understanding.       Expected Outcomes Pt will be compliant with HEP and improve in cardiorespiratory fitness and endurance.            Discharge Exercise Prescription (Final Exercise Prescription Changes):     Exercise Prescription Changes - 06/29/17 1200      Response to Exercise   Blood Pressure (Admit) 126/82   Blood Pressure (Exercise) 136/70   Blood Pressure (Exit) 136/84   Heart Rate (Admit) 64 bpm   Heart Rate (Exercise) 84 bpm   Heart Rate (Exit) 64 bpm   Rating of Perceived Exertion (Exercise) 12   Symptoms occ. knee pain with long periods of standing and walking   Comments exercise equiment was adjusted for pt comfort   Duration Continue with 30 min of aerobic exercise without signs/symptoms of physical distress.   Intensity THRR unchanged     Progression   Progression Continue to progress workloads to maintain intensity without signs/symptoms of physical distress.   Average METs 1.9     Resistance Training   Training Prescription Yes   Weight 3lbs   Reps 10-15   Time 10 Minutes     Bike   Level 0.5   Minutes 10   METs 1.7     NuStep   Level 4   SPM 80   Minutes 10   METs 2.1     Arm Ergometer   Level 1   Minutes 10   METs 1.9     Track   Laps 5   Minutes 10   METs 1.84     Home Exercise Plan   Plans to continue exercise at Home (comment)   Frequency Add 2 additional days to program exercise sessions.   Initial Home Exercises Provided 06/28/17      Nutrition:  Target Goals: Understanding of nutrition guidelines, daily intake of sodium 1500mg ,  cholesterol 200mg , calories 30% from fat and 7% or less from saturated fats, daily to have 5 or more servings of fruits and vegetables.  Biometrics:     Pre Biometrics - 06/10/17 1544      Pre Biometrics   Height 5' 11.75" (1.822 m)   Weight 294 lb 15.6 oz (133.8 kg)   Waist Circumference 53 inches   Hip Circumference 49 inches   Waist to Hip Ratio 1.08 %   BMI (Calculated) 40.31   Triceps Skinfold 25 mm   % Body Fat 40 %   Grip Strength 38 kg   Flexibility 9.5 in   Single Leg Stand 2.55 seconds       Nutrition Therapy Plan and Nutrition Goals:     Nutrition Therapy & Goals - 06/10/17 1553      Nutrition Therapy   Diet Carb Modified, Therapeutic Lifestyle Changes     Personal Nutrition Goals   Nutrition Goal Wt loss of 1-2 lb/week to a wt loss goal of 6-24 lb while in Cardiac Rehab. Goal wt loss is 30 lb.      Intervention Plan   Intervention Prescribe, educate and counsel regarding individualized specific dietary modifications aiming towards targeted core components such as weight, hypertension, lipid  management, diabetes, heart failure and other comorbidities.   Expected Outcomes Short Term Goal: Understand basic principles of dietary content, such as calories, fat, sodium, cholesterol and nutrients.;Long Term Goal: Adherence to prescribed nutrition plan.      Nutrition Discharge: Nutrition Scores:     Nutrition Assessments - 06/10/17 1553      MEDFICTS Scores   Pre Score 9      Nutrition Goals Re-Evaluation:   Nutrition Goals Re-Evaluation:   Nutrition Goals Discharge (Final Nutrition Goals Re-Evaluation):   Psychosocial: Target Goals: Acknowledge presence or absence of significant depression and/or stress, maximize coping skills, provide positive support system. Participant is able to verbalize types and ability to use techniques and skills needed for reducing stress and depression.  Initial Review & Psychosocial Screening:     Initial Psych  Review & Screening - 06/10/17 1534      Initial Review   Current issues with None Identified     Family Dynamics   Good Support System? Yes     Barriers   Psychosocial barriers to participate in program There are no identifiable barriers or psychosocial needs.     Screening Interventions   Interventions Encouraged to exercise      Quality of Life Scores:     Quality of Life - 06/25/17 1541      Quality of Life Scores   Health/Function Pre 23.47 %   Socioeconomic Pre 28.75 %   Psych/Spiritual Pre 28.07 %   Family Pre 28.8 %   GLOBAL Pre 26.36 %  QOL scores reveiwed with pt. pt concerns health related. pt denies specific problems and is eager to regain his health and stamina. pt encouraged to continue staff recommendation for diet and exercise to increase ability to achieve goals.        PHQ-9: Recent Review Flowsheet Data    Depression screen [facility] 2/9 06/14/2017   Decreased Interest 0   Down, Depressed, Hopeless 0   PHQ - 2 Score 0     Interpretation of Total Score  Total Score Depression Severity:  1-4 = Minimal depression, 5-9 = Mild depression, 10-14 = Moderate depression, 15-19 = Moderately severe depression, 20-27 = Severe depression   Psychosocial Evaluation and Intervention:     Psychosocial Evaluation - 06/14/17 1651      Psychosocial Evaluation & Interventions   Interventions Encouraged to exercise with the program and follow exercise prescription;Stress management education;Relaxation education   Comments upon brief assessment, no psychosocial needs identified, no interventions necessary    Expected Outcomes pt will exhibit positive outlook with good coping skills.    Continue Psychosocial Services  No Follow up required      Psychosocial Re-Evaluation:     Psychosocial Re-Evaluation    [place] Name 06/23/17 1031             Psychosocial Re-Evaluation   Current issues with None Identified       Comments no psychoosocial needs identified, no  interventions necessary        Expected Outcomes pt will exhibit positive outlook with good coping skills.        Interventions Relaxation education;Stress management education;Encouraged to attend Cardiac Rehabilitation for the exercise       Continue Psychosocial Services  No Follow up required          Psychosocial Discharge (Final Psychosocial Re-Evaluation):     Psychosocial Re-Evaluation - 06/23/17 1031      Psychosocial Re-Evaluation   Current issues with None Identified   Comments  no psychoosocial needs identified, no interventions necessary    Expected Outcomes pt will exhibit positive outlook with good coping skills.    Interventions Relaxation education;Stress management education;Encouraged to attend Cardiac Rehabilitation for the exercise   Continue Psychosocial Services  No Follow up required      Vocational Rehabilitation: Provide vocational rehab assistance to qualifying candidates.   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 Participant will state understanding/return demonstration of topics presented.  Learning Barriers/Preferences:     Learning Barriers/Preferences - 06/10/17 1108      Learning Barriers/Preferences   Learning Barriers Sight   Learning Preferences Pictoral;Video;Skilled Demonstration      Education Topics: Count Your Pulse:  -Group instruction provided by verbal instruction, demonstration, patient participation and written materials to support subject.  Instructors address importance of being able to find your pulse and how to count your pulse when at home without a heart monitor.  Patients get hands on  experience counting their pulse with staff help and individually.   Heart Attack, Angina, and Risk Factor Modification:  -Group instruction provided by verbal instruction, video, and written materials to support subject.  Instructors address signs and symptoms of angina and heart attacks.    Also discuss risk factors for heart disease and how to make changes to improve heart health risk factors.   Functional Fitness:  -Group instruction provided by verbal instruction, demonstration, patient participation, and written materials to support subject.  Instructors address safety measures for doing things around the house.  Discuss how to get up and down off the floor, how to pick things up properly, how to safely get out of a chair without assistance, and balance training.   Meditation and Mindfulness:  -Group instruction provided by verbal instruction, patient participation, and written materials to support subject.  Instructor addresses importance of mindfulness and meditation practice to help reduce stress and improve awareness.  Instructor also leads participants through a meditation exercise.    CARDIAC REHAB PHASE II EXERCISE from 06/25/2017 in [place]  Date  06/23/17  Instruction Review Code  2- meets goals/outcomes      Stretching for Flexibility and Mobility:  -Group instruction provided by verbal instruction, patient participation, and written materials to support subject.  Instructors lead participants through series of stretches that are designed to increase flexibility thus improving mobility.  These stretches are additional exercise for major muscle groups that are typically performed during regular warm up and cool down.   CARDIAC REHAB PHASE II EXERCISE from 06/25/2017 in [place]  Date  06/25/17  Instruction Review Code  2- meets goals/outcomes      Hands Only CPR:  -Group verbal, video, and participation  provides a basic overview of AHA guidelines for community CPR. Role-play of emergencies allow participants the opportunity to practice calling for help and chest compression technique with discussion of AED use.   Hypertension: -Group verbal and written instruction that provides a basic overview of hypertension including the most recent diagnostic guidelines, risk factor reduction with self-care instructions and medication management.   CARDIAC REHAB PHASE II EXERCISE from 06/25/2017 in [place]  [place]  Date  06/18/17  Instruction Review Code  2- meets goals/outcomes       Nutrition I class: Heart Healthy Eating:  -Group instruction provided by PowerPoint slides, verbal discussion, and written materials to support subject matter. The instructor gives an explanation and review of the Therapeutic Lifestyle Changes diet recommendations, which includes a discussion on lipid goals, dietary fat, sodium, fiber, plant stanol/sterol esters, sugar, and the components of a well-balanced, healthy diet.   Nutrition II class: Lifestyle Skills:  -Group instruction provided by PowerPoint slides, verbal discussion, and written materials to support subject matter. The instructor gives an explanation and review of label reading, grocery shopping for heart health, heart healthy recipe modifications, and ways to make healthier choices when eating out.   Diabetes Question & Answer:  -Group instruction provided by PowerPoint slides, verbal discussion, and written materials to support subject matter. The instructor gives an explanation and review of diabetes co-morbidities, pre- and post-prandial blood glucose goals, pre-exercise blood glucose goals, signs, symptoms, and treatment of hypoglycemia and hyperglycemia, and foot care basics.   Diabetes Blitz:  -Group instruction provided by PowerPoint slides, verbal discussion, and written materials to support subject matter. The instructor  gives an explanation and review of the physiology behind type 1 and type 2 diabetes, diabetes medications and rational behind using different medications, pre- and post-prandial blood glucose recommendations and Hemoglobin A1c goals, diabetes diet, and exercise including blood glucose guidelines for exercising safely.    Portion Distortion:  -Group instruction provided by PowerPoint slides, verbal discussion, written materials, and food models to support subject matter. The instructor gives an explanation of serving size versus portion size, changes in portions sizes over the last 20 years, and what consists of a serving from each food group.   Stress Management:  -Group instruction provided by verbal instruction, video, and written materials to support subject matter.  Instructors review role of stress in heart disease and how to cope with stress positively.     Exercising on Your Own:  -Group instruction provided by verbal instruction, power point, and written materials to support subject.  Instructors discuss benefits of exercise, components of exercise, frequency and intensity of exercise, and end points for exercise.  Also discuss use of nitroglycerin and activating EMS.  Review options of places to exercise outside of rehab.  Review guidelines for sex with heart disease.   Cardiac Drugs I:  -Group instruction provided by verbal instruction and written materials to support subject.  Instructor reviews cardiac drug classes: antiplatelets, anticoagulants, beta blockers, and statins.  Instructor discusses reasons, side effects, and lifestyle considerations for each drug class.   Cardiac Drugs II:  -Group instruction provided by verbal instruction and written materials to support subject.  Instructor reviews cardiac drug classes: angiotensin converting enzyme inhibitors (ACE-I), angiotensin II receptor blockers (ARBs), nitrates, and calcium channel blockers.  Instructor discusses reasons, side  effects, and lifestyle considerations for each drug class.   Anatomy and Physiology of the Circulatory System:  Group verbal and written instruction and models provide basic cardiac anatomy and physiology, with the coronary electrical and arterial systems. Review of: AMI, Angina, Valve disease, Heart Failure, Peripheral Artery Disease, Cardiac Arrhythmia, Pacemakers, and the ICD.   Other Education:  -Group or individual verbal, written, or video instructions that support the educational goals of the cardiac rehab program.   Knowledge Questionnaire Score:     Knowledge Questionnaire Score - 06/10/17 1426      Knowledge Questionnaire Score  Pre Score 21/24      Core Components/Risk Factors/Patient Goals at Admission:     Personal Goals and Risk Factors at Admission - 06/10/17 1547      Core Components/Risk Factors/Patient Goals on Admission   Intervention Weight Management: Develop a combined nutrition and exercise program designed to reach desired caloric intake, while maintaining appropriate intake of nutrient and fiber, sodium and fats, and appropriate energy expenditure required for the weight goal.;Weight Management: Provide education and appropriate resources to help participant work on and attain dietary goals.;Obesity: Provide education and appropriate resources to help participant work on and attain dietary goals.;Weight Management/Obesity: Establish reasonable short term and long term weight goals.   Admit Weight 294 lb 15.6 oz (133.8 kg)   Goal Weight: Short Term 284 lb (128.8 kg)   Goal Weight: Long Term 264 lb (119.7 kg)   Expected Outcomes Short Term: Continue to assess and modify interventions until short term weight is achieved;Long Term: Adherence to nutrition and physical activity/exercise program aimed toward attainment of established weight goal;Weight Maintenance: Understanding of the daily nutrition guidelines, which includes 25-35% calories from fat, 7% or less  cal from saturated fats, less than 200mg  cholesterol, less than 1.5gm of sodium, & 5 or more servings of fruits and vegetables daily;Weight Loss: Understanding of general recommendations for a balanced deficit meal plan, which promotes 1-2 lb weight loss per week and includes a negative energy balance of [phone] kcal/d;Understanding recommendations for meals to include 15-35% energy as protein, 25-35% energy from fat, 35-60% energy from carbohydrates, less than 200mg  of dietary cholesterol, 20-35 gm of total fiber daily;Understanding of distribution of calorie intake throughout the day with the consumption of 4-5 meals/snacks   Tobacco Cessation Yes   Intervention Assist the participant in steps to quit. Provide individualized education and counseling about committing to Tobacco Cessation, relapse prevention, and pharmacological support that can be provided by physician.;Advice worker, assist with locating and accessing local/national Quit Smoking programs, and support quit date choice.   Expected Outcomes Short Term: Will demonstrate readiness to quit, by selecting a quit date.;Short Term: Will quit all tobacco product use, adhering to prevention of relapse plan.;Long Term: Complete abstinence from all tobacco products for at least 12 months from quit date.   Diabetes Yes   Intervention Provide education about signs/symptoms and action to take for hypo/hyperglycemia.;Provide education about proper nutrition, including hydration, and aerobic/resistive exercise prescription along with prescribed medications to achieve blood glucose in normal ranges: Fasting glucose 65-99 mg/dL   Expected Outcomes Short Term: Participant verbalizes understanding of the signs/symptoms and immediate care of hyper/hypoglycemia, proper foot care and importance of medication, aerobic/resistive exercise and nutrition plan for blood glucose control.;Long Term: Attainment of HbA1C < 7%.   Hypertension Yes    Intervention Provide education on lifestyle modifcations including regular physical activity/exercise, weight management, moderate sodium restriction and increased consumption of fresh fruit, vegetables, and low fat dairy, alcohol moderation, and smoking cessation.;Monitor prescription use compliance.   Expected Outcomes Short Term: Continued assessment and intervention until BP is < 140/43mm HG in hypertensive participants. < 130/29mm HG in hypertensive participants with diabetes, heart failure or chronic kidney disease.;Long Term: Maintenance of blood pressure at goal levels.   Lipids Yes   Intervention Provide education and support for participant on nutrition & aerobic/resistive exercise along with prescribed medications to achieve LDL 70mg , HDL >40mg .   Expected Outcomes Short Term: Participant states understanding of desired cholesterol values and is compliant with medications prescribed. Participant is following exercise  prescription and nutrition guidelines.;Long Term: Cholesterol controlled with medications as prescribed, with individualized exercise RX and with personalized nutrition plan. Value goals: LDL < 70mg , HDL > 40 mg.      Core Components/Risk Factors/Patient Goals Review:      Goals and Risk Factor Review    Row Name 06/23/17 1029             Core Components/Risk Factors/Patient Goals Review   Personal Goals Review Weight Management/Obesity;Tobacco Cessation;Hypertension;Lipids;Diabetes       Review pt with multiple CAD RF demonstrates eagerness to participate in CR activities.  pt continues in determination phase of tobacco cessation and activity progression.         Expected Outcomes pt will continue CR exercise, nutrition and lifestyle modification to reduce overall RF.           Core Components/Risk Factors/Patient Goals at Discharge (Final Review):      Goals and Risk Factor Review - 06/23/17 1029      Core Components/Risk Factors/Patient Goals Review    Personal Goals Review Weight Management/Obesity;Tobacco Cessation;Hypertension;Lipids;Diabetes   Review pt with multiple CAD RF demonstrates eagerness to participate in CR activities.  pt continues in determination phase of tobacco cessation and activity progression.     Expected Outcomes pt will continue CR exercise, nutrition and lifestyle modification to reduce overall RF.       ITP Comments:     ITP Comments    Row Name 06/10/17 1102 06/29/17 1550         ITP Comments Dr Fransico Him, Medical Director 30 day ITP review.  Pt with good participation and attendance.  No change in current regimen unless directed by Medical Director.           Comments:

## 2017-06-29 NOTE — Progress Notes (Signed)
Reviewed home exercise with pt today.  Pt plans to ride road bike for exercise, 2x/week.Discussed how to progress exercise safely. Reviewed THR, pulse, RPE, sign and symptoms, and when to call 911 or MD.  Also discussed weather considerations and indoor options.  Pt voiced understanding.     Kimberly-Clark

## 2017-06-30 ENCOUNTER — Encounter (HOSPITAL_COMMUNITY)
Admission: RE | Admit: 2017-06-30 | Discharge: 2017-06-30 | Disposition: A | Discharge status: Home / Self Care | Payer: Medicare Other | Source: Ambulatory Visit | Attending: Cardiovascular Disease | Admitting: Cardiovascular Disease

## 2017-06-30 DIAGNOSIS — Z48812 Encounter for surgical aftercare following surgery on the circulatory system: Secondary | ICD-10-CM | POA: Diagnosis not present

## 2017-06-30 DIAGNOSIS — Z951 Presence of aortocoronary bypass graft: Secondary | ICD-10-CM | POA: Diagnosis not present

## 2017-07-02 ENCOUNTER — Encounter (HOSPITAL_COMMUNITY): Payer: Medicare Other

## 2017-07-05 ENCOUNTER — Encounter (HOSPITAL_COMMUNITY)
Admission: RE | Admit: 2017-07-05 | Discharge: 2017-07-05 | Disposition: A | Discharge status: Home / Self Care | Payer: Medicare Other | Source: Ambulatory Visit | Attending: Cardiovascular Disease | Admitting: Cardiovascular Disease

## 2017-07-05 DIAGNOSIS — Z951 Presence of aortocoronary bypass graft: Secondary | ICD-10-CM | POA: Diagnosis not present

## 2017-07-05 DIAGNOSIS — Z48812 Encounter for surgical aftercare following surgery on the circulatory system: Secondary | ICD-10-CM | POA: Diagnosis not present

## 2017-07-05 NOTE — Progress Notes (Signed)
David Frost 66 y.o. male DOB: Dec 15, 1950 MRN: [id number]      Nutrition Note  1. S/P CABG x 6    Note Spoke with pt. Nutrition plan and survey reviewed with pt. Pt is following Step 2 of the Therapeutic Lifestyle Changes diet. Pt wants to lose wt. Pt has been trying to lose wt by decreasing portion sizes and making healthier food choices. Despite changing diet, pt is struggling to lose wt. Per discussion, feel additional calories are coming from "nightly beers" consumed. Pt declined to comment re: how many beers he drinks nightly. Wt loss tips reviewed.  Pt's diabetes appears to be well-controlled at this time. Pt expressed understanding of the information reviewed. Pt aware of nutrition education classes offered and plans on attending nutrition classes.  Nutrition Diagnosis ? Food-and nutrition-related knowledge deficit related to lack of exposure to information as related to diagnosis of: ? CVD ? DM ? Obesity related to excessive energy intake as evidenced by a BMI of 40.3  Nutrition Intervention ? Pt's individual nutrition plan and goals reviewed with pt. ? Benefits of adopting Therapeutic Lifestyle Changes discussed when [employer] reviewed.    Nutrition Goal(s):  ? Pt to identify food quantities necessary to achieve weight loss of 6-24 lb (2.7-10.9 kg) at graduation from cardiac rehab. Goal wt loss of 30 lb desired.   Plan:  Pt to attend nutrition classes ? Nutrition I ? Nutrition II ? Portion Distortion ? Diabetes Blitz ? Diabetes Q & A Will provide client-centered nutrition education as part of interdisciplinary care.   Monitor and evaluate progress toward nutrition goal with team.  Derek Mound, M.Ed, RD, LDN, CDE 07/05/2017 9:19 AM

## 2017-07-07 ENCOUNTER — Encounter (HOSPITAL_COMMUNITY): Payer: Medicare Other

## 2017-07-09 ENCOUNTER — Encounter (HOSPITAL_COMMUNITY)
Admission: RE | Admit: 2017-07-09 | Discharge: 2017-07-09 | Disposition: A | Discharge status: Home / Self Care | Payer: Medicare Other | Source: Ambulatory Visit | Attending: Cardiovascular Disease | Admitting: Cardiovascular Disease

## 2017-07-09 DIAGNOSIS — Z951 Presence of aortocoronary bypass graft: Secondary | ICD-10-CM | POA: Diagnosis not present

## 2017-07-09 DIAGNOSIS — Z48812 Encounter for surgical aftercare following surgery on the circulatory system: Secondary | ICD-10-CM | POA: Diagnosis not present

## 2017-07-09 LAB — GLUCOSE, CAPILLARY: Glucose-Capillary: 116 mg/dL — ABNORMAL HIGH (ref 65–99)

## 2017-07-12 ENCOUNTER — Encounter (HOSPITAL_COMMUNITY)
Admission: RE | Admit: 2017-07-12 | Discharge: 2017-07-12 | Disposition: A | Discharge status: Home / Self Care | Payer: Medicare Other | Source: Ambulatory Visit | Attending: Cardiovascular Disease | Admitting: Cardiovascular Disease

## 2017-07-12 DIAGNOSIS — Z48812 Encounter for surgical aftercare following surgery on the circulatory system: Secondary | ICD-10-CM | POA: Diagnosis not present

## 2017-07-12 DIAGNOSIS — Z951 Presence of aortocoronary bypass graft: Secondary | ICD-10-CM | POA: Diagnosis not present

## 2017-07-14 ENCOUNTER — Encounter (HOSPITAL_COMMUNITY)
Admission: RE | Admit: 2017-07-14 | Discharge: 2017-07-14 | Disposition: A | Discharge status: Home / Self Care | Payer: Medicare Other | Source: Ambulatory Visit | Attending: Cardiovascular Disease | Admitting: Cardiovascular Disease

## 2017-07-14 DIAGNOSIS — Z48812 Encounter for surgical aftercare following surgery on the circulatory system: Secondary | ICD-10-CM | POA: Diagnosis not present

## 2017-07-14 DIAGNOSIS — Z951 Presence of aortocoronary bypass graft: Secondary | ICD-10-CM

## 2017-07-16 ENCOUNTER — Encounter (HOSPITAL_COMMUNITY)
Admission: RE | Admit: 2017-07-16 | Discharge: 2017-07-16 | Disposition: A | Discharge status: Home / Self Care | Payer: Medicare Other | Source: Ambulatory Visit | Attending: Cardiovascular Disease | Admitting: Cardiovascular Disease

## 2017-07-16 DIAGNOSIS — Z48812 Encounter for surgical aftercare following surgery on the circulatory system: Secondary | ICD-10-CM | POA: Diagnosis not present

## 2017-07-16 DIAGNOSIS — Z951 Presence of aortocoronary bypass graft: Secondary | ICD-10-CM | POA: Diagnosis not present

## 2017-07-16 LAB — GLUCOSE, CAPILLARY: GLUCOSE-CAPILLARY: 96 mg/dL (ref 65–99)

## 2017-07-19 ENCOUNTER — Encounter (HOSPITAL_COMMUNITY)
Admission: RE | Admit: 2017-07-19 | Discharge: 2017-07-19 | Disposition: A | Discharge status: Home / Self Care | Payer: Medicare Other | Source: Ambulatory Visit | Attending: Cardiovascular Disease | Admitting: Cardiovascular Disease

## 2017-07-19 DIAGNOSIS — Z951 Presence of aortocoronary bypass graft: Secondary | ICD-10-CM

## 2017-07-19 DIAGNOSIS — Z48812 Encounter for surgical aftercare following surgery on the circulatory system: Secondary | ICD-10-CM | POA: Diagnosis not present

## 2017-07-21 ENCOUNTER — Encounter (HOSPITAL_COMMUNITY)
Admission: RE | Admit: 2017-07-21 | Discharge: 2017-07-21 | Disposition: A | Discharge status: Home / Self Care | Payer: Medicare Other | Source: Ambulatory Visit | Attending: Cardiovascular Disease | Admitting: Cardiovascular Disease

## 2017-07-22 ENCOUNTER — Other Ambulatory Visit: Payer: Self-pay | Admitting: Physician Assistant

## 2017-07-22 NOTE — Telephone Encounter (Signed)
Please review for refill, thanks ! 

## 2017-07-23 ENCOUNTER — Encounter: Payer: Self-pay | Admitting: Cardiovascular Disease

## 2017-07-23 ENCOUNTER — Ambulatory Visit (INDEPENDENT_AMBULATORY_CARE_PROVIDER_SITE_OTHER): Payer: Medicare Other | Admitting: Cardiovascular Disease

## 2017-07-23 ENCOUNTER — Encounter (HOSPITAL_COMMUNITY)
Admission: RE | Admit: 2017-07-23 | Discharge: 2017-07-23 | Disposition: A | Discharge status: Home / Self Care | Payer: Medicare Other | Source: Ambulatory Visit | Attending: Cardiovascular Disease | Admitting: Cardiovascular Disease

## 2017-07-23 ENCOUNTER — Encounter (HOSPITAL_COMMUNITY): Payer: Medicare Other

## 2017-07-23 VITALS — BP 135/68 | HR 65 | Ht 74.0 in | Wt 292.8 lb

## 2017-07-23 DIAGNOSIS — Z951 Presence of aortocoronary bypass graft: Secondary | ICD-10-CM

## 2017-07-23 DIAGNOSIS — I739 Peripheral vascular disease, unspecified: Secondary | ICD-10-CM

## 2017-07-23 DIAGNOSIS — I10 Essential (primary) hypertension: Secondary | ICD-10-CM

## 2017-07-23 DIAGNOSIS — M79605 Pain in left leg: Secondary | ICD-10-CM | POA: Diagnosis not present

## 2017-07-23 DIAGNOSIS — E785 Hyperlipidemia, unspecified: Secondary | ICD-10-CM | POA: Diagnosis not present

## 2017-07-23 DIAGNOSIS — Z48812 Encounter for surgical aftercare following surgery on the circulatory system: Secondary | ICD-10-CM | POA: Insufficient documentation

## 2017-07-23 DIAGNOSIS — I4891 Unspecified atrial fibrillation: Secondary | ICD-10-CM | POA: Diagnosis not present

## 2017-07-23 DIAGNOSIS — I2510 Atherosclerotic heart disease of native coronary artery without angina pectoris: Secondary | ICD-10-CM

## 2017-07-23 DIAGNOSIS — M79604 Pain in right leg: Secondary | ICD-10-CM

## 2017-07-23 MED ORDER — AMIODARONE HCL 100 MG PO TABS: 100.0000 mg | ORAL_TABLET | Freq: Every day | ORAL | 6 refills | Status: DC

## 2017-07-23 NOTE — Assessment & Plan Note (Signed)
History of obstructive sleep apnea on CPAP which he benefits from 

## 2017-07-23 NOTE — Assessment & Plan Note (Signed)
History of essential hypertension blood pressure measured 135/68. He is on metoprolol. Continue current meds at current dosing

## 2017-07-23 NOTE — Progress Notes (Signed)
Cardiac Individual Treatment Plan  Patient Details  Name: David Frost MRN: [id number] Date of Birth: 06/13/1951 Referring Provider:     CARDIAC REHAB PHASE II ORIENTATION from 06/10/2017 in [place]  Referring Provider  Quay Burow MD      Initial Encounter Date:    CARDIAC REHAB PHASE II ORIENTATION from 06/10/2017 in [facility]  Date  06/10/17  Referring Provider  Quay Burow MD      Visit Diagnosis: S/P CABG x 6  Patient's Home Medications on Admission:  Current Outpatient Prescriptions:  .  acetaminophen (TYLENOL) 325 MG tablet, Take 2 tablets (650 mg total) by mouth every 6 (six) hours as needed for mild pain., Disp: , Rfl:  .  ALPRAZolam (XANAX) 0.25 MG tablet, Take 1 tablet (0.25 mg total) by mouth 2 (two) times daily as needed for anxiety., Disp: 14 tablet, Rfl: 0 .  amiodarone (PACERONE) 100 MG tablet, Take 1 tablet (100 mg total) by mouth daily., Disp: 30 tablet, Rfl: 6 .  atorvastatin (LIPITOR) 80 MG tablet, Take 1 tablet (80 mg total) by mouth daily at 6 PM., Disp: 30 tablet, Rfl: 3 .  brimonidine-timolol (COMBIGAN) 0.2-0.5 % ophthalmic solution, Place 1 drop into both eyes 2 (two) times daily. , Disp: , Rfl:  .  Canagliflozin (INVOKANA) 300 MG TABS, Take 300 mg by mouth daily., Disp: , Rfl:  .  metoprolol succinate (TOPROL-XL) 50 MG 24 hr tablet, Take 1 tablet (50 mg total) by mouth daily., Disp: 60 tablet, Rfl: 2 .  Multiple Vitamin (MULTIVITAMIN WITH MINERALS) TABS tablet, Take 1 tablet by mouth daily., Disp: , Rfl:  .  nitroGLYCERIN (NITROSTAT) 0.4 MG SL tablet, Place 1 tablet (0.4 mg total) under the tongue every 5 (five) minutes as needed for chest pain., Disp: 25 tablet, Rfl: 3 .  omeprazole (PRILOSEC) 40 MG capsule, Take 1 capsule (40 mg total) by mouth daily., Disp: 30 capsule, Rfl: 11 .  Polyethylene Glycol 3350 (MIRALAX PO), Take 1 Dose by mouth every other day. , Disp: , Rfl:  .   PROAIR RESPICLICK 332 (90 Base) MCG/ACT AEPB, Take 2 puffs by mouth as directed., Disp: , Rfl: 1  Past Medical History: Past Medical History:  Diagnosis Date  . Anxiety   . Coronary artery disease    a. s/p CABG in 03/2017 with LIMA-LAD, reverse SVG-intermediate, sequential reverse SVG-OM1-dLCx, and sequential reverse SVG-PDA-PLB  . Diabetes mellitus without complication ([place])    type 2  . Dietary indiscretion   . Dyspnea   . GERD (gastroesophageal reflux disease)   . Hypertension   . Obesity   . Obstructive sleep apnea   . PAF (paroxysmal atrial fibrillation) ([place])    a. s/p DCCV in 04/2017  . Right bundle branch block     Tobacco Use: History  Smoking Status  . Former Smoker  . Years: 2.00  . Types: Cigars  . Quit date: 03/08/2017  Smokeless Tobacco  . Never Used    Labs: Recent Review Flowsheet Data    Labs for ITP Cardiac and Pulmonary Rehab Latest Ref Rng & Units 03/22/2017 03/22/2017 03/22/2017 03/22/2017 03/23/2017   Cholestrol 0 - 200 mg/dL - - - - -   LDLCALC 0 - 99 mg/dL - - - - -   HDL >40 mg/dL - - - - -   Trlycerides <150 mg/dL - - - - -   Hemoglobin A1c 4.8 - 5.6 % - - - - -  PHART 7.350 - 7.450 7.494(H) 7.438 7.414 - -   PCO2ART 32.0 - 48.0 mmHg 38.9 47.2 50.8(H) - -   HCO3 20.0 - 28.0 mmol/L 29.9(H) 31.6(H) 32.5(H) - -   TCO2 0 - 100 mmol/L 31 33 34 29 27   O2SAT % 99.0 98.0 97.0 - -      Capillary Blood Glucose: Lab Results  Component Value Date   GLUCAP 96 07/16/2017   GLUCAP 116 (H) 07/09/2017   GLUCAP 114 (H) 06/28/2017   GLUCAP 118 (H) 06/18/2017   GLUCAP 114 (H) 06/16/2017     Exercise Target Goals:    Exercise Program Goal: Individual exercise prescription set with THRR, safety & activity barriers. Participant demonstrates ability to understand and report RPE using BORG scale, to self-measure pulse accurately, and to acknowledge the importance of the exercise prescription.  Exercise Prescription Goal: Starting with aerobic activity 30  plus minutes a day, 3 days per week for initial exercise prescription. Provide home exercise prescription and guidelines that participant acknowledges understanding prior to discharge.  Activity Barriers & Risk Stratification:     Activity Barriers & Cardiac Risk Stratification - 06/10/17 1546      Activity Barriers & Cardiac Risk Stratification   Activity Barriers Other (comment);Shortness of Breath;Muscular Weakness;Deconditioning   Comments B  knee pain   Cardiac Risk Stratification High      6 Minute Walk:     6 Minute Walk    Row Name 06/10/17 1427         6 Minute Walk   Phase Initial     Distance 1254 feet     Walk Time 6 minutes     # of Rest Breaks 0     MPH 2.4     METS 2.2     RPE 13     VO2 Peak 7.7     Symptoms No     Resting HR 61 bpm     Resting BP 134/76     Resting Oxygen Saturation  96 %     Exercise Oxygen Saturation  during 6 min walk 95 %     Max Ex. HR 89 bpm     Max Ex. BP 154/82     2 Minute Post BP 142/60        Oxygen Initial Assessment:   Oxygen Re-Evaluation:   Oxygen Discharge (Final Oxygen Re-Evaluation):   Initial Exercise Prescription:     Initial Exercise Prescription - 06/10/17 1400      Date of Initial Exercise RX and Referring Provider   Date 06/10/17   Referring Provider Quay Burow MD     Treadmill   MPH 2.3   Grade 0   Minutes 10   METs 2.76     Bike   Level 0.5   Minutes 10   METs 1.7     NuStep   Level 3   SPM 80   Minutes 10   METs 2     Prescription Details   Frequency (times per week) 3   Duration Progress to 30 minutes of continuous aerobic without signs/symptoms of physical distress     Intensity   THRR 40-80% of Max Heartrate 61-122   Ratings of Perceived Exertion 11-15   Perceived Dyspnea 0-4     Progression   Progression Continue progressive overload as per policy without signs/symptoms or physical distress.     Resistance Training   Training Prescription Yes   Weight 3lbs    Reps 10-15  Perform Capillary Blood Glucose checks as needed.  Exercise Prescription Changes:      Exercise Prescription Changes    Row Name 06/14/17 1249 06/29/17 1200 07/12/17 1500         Response to Exercise   Blood Pressure (Admit) 146/70 126/82 136/90     Blood Pressure (Exercise) 140/84 136/70 138/80     Blood Pressure (Exit) 142/74 136/84 132/70     Heart Rate (Admit) 80 bpm 64 bpm 66 bpm     Heart Rate (Exercise) 95 bpm 84 bpm 86 bpm     Heart Rate (Exit) 67 bpm 64 bpm 75 bpm     Rating of Perceived Exertion (Exercise) 12 12 12      Symptoms fatigue, deconditioned, mild knee pain occ. knee pain with long periods of standing and walking occ. knee pain with long periods of standing and walking     Comments pt was oriented to exercise equiment today exercise equiment was adjusted for pt comfort exercise equiment was adjusted for pt comfort     Duration Continue with 30 min of aerobic exercise without signs/symptoms of physical distress. Continue with 30 min of aerobic exercise without signs/symptoms of physical distress. Continue with 30 min of aerobic exercise without signs/symptoms of physical distress.     Intensity THRR unchanged THRR unchanged THRR unchanged       Progression   Progression Continue to progress workloads to maintain intensity without signs/symptoms of physical distress. Continue to progress workloads to maintain intensity without signs/symptoms of physical distress. Continue to progress workloads to maintain intensity without signs/symptoms of physical distress.     Average METs 1.9 1.9 2       Resistance Training   Training Prescription Yes Yes Yes     Weight 3lbs 3lbs 3lbs     Reps 10-15 10-15 10-15     Time 10 Minutes 10 Minutes 10 Minutes       Bike   Level 0.5 0.5 0.5     Minutes 10 10 10      METs 1.7 1.7 1.7       NuStep   Level 3 4 4      SPM 80 80 85     Minutes 10 10 10      METs 2.1 2.1 2.3       Arm Ergometer   Level  - 1 2      Minutes  - 10 10     METs  - 1.9 1.9       Track   Laps  - 5 5     Minutes  - 10 10     METs  - 1.84 1.84       Home Exercise Plan   Plans to continue exercise at  - Home (comment) Home (comment)     Frequency  - Add 2 additional days to program exercise sessions. Add 2 additional days to program exercise sessions.     Initial Home Exercises Provided  - 06/28/17 06/28/17        Exercise Comments:      Exercise Comments    Row Name 06/29/17 1247 07/20/17 1406         Exercise Comments Reviewed METs and goals. Pt is tolerating exercise well. Pt is often times limited with exercise due to knee pain. Otherwise pt is responding well to exercise changes. Reviewed METs and goals. Pt is tolerating exercise well. Pt is often times limited with exercise due to knee pain. Otherwise pt is  responding well to exercise changes.         Exercise Goals and Review:      Exercise Goals    Row Name 06/10/17 1404 06/10/17 1431           Exercise Goals   Increase Physical Activity Yes  -      Intervention Provide advice, education, support and counseling about physical activity/exercise needs.;Develop an individualized exercise prescription for aerobic and resistive training based on initial evaluation findings, risk stratification, comorbidities and participant's personal goals.  -      Expected Outcomes Achievement of increased cardiorespiratory fitness and enhanced flexibility, muscular endurance and strength shown through measurements of functional capacity and personal statement of participant.  -      Increase Strength and Stamina Yes -  get back to riding bike      Intervention Provide advice, education, support and counseling about physical activity/exercise needs.;Develop an individualized exercise prescription for aerobic and resistive training based on initial evaluation findings, risk stratification, comorbidities and participant's personal goals.  -      Expected Outcomes  Achievement of increased cardiorespiratory fitness and enhanced flexibility, muscular endurance and strength shown through measurements of functional capacity and personal statement of participant.  -      Able to understand and use rate of perceived exertion (RPE) scale Yes  -      Intervention Provide education and explanation on how to use RPE scale  -      Expected Outcomes Short Term: Able to use RPE daily in rehab to express subjective intensity level;Long Term:  Able to use RPE to guide intensity level when exercising independently  -      Knowledge and understanding of Target Heart Rate Range (THRR) Yes  -      Intervention Provide education and explanation of THRR including how the numbers were predicted and where they are located for reference  -      Expected Outcomes Short Term: Able to state/look up THRR;Long Term: Able to use THRR to govern intensity when exercising independently;Short Term: Able to use daily as guideline for intensity in rehab  -      Able to check pulse independently Yes  -      Intervention Provide education and demonstration on how to check pulse in carotid and radial arteries.;Review the importance of being able to check your own pulse for safety during independent exercise  -      Expected Outcomes Short Term: Able to explain why pulse checking is important during independent exercise;Long Term: Able to check pulse independently and accurately  -      Understanding of Exercise Prescription Yes  -      Intervention Provide education, explanation, and written materials on patient's individual exercise prescription  -      Expected Outcomes Short Term: Able to explain program exercise prescription;Long Term: Able to explain home exercise prescription to exercise independently  -         Exercise Goals Re-Evaluation :     Exercise Goals Re-Evaluation    Row Name 06/29/17 1248 07/20/17 1406           Exercise Goal Re-Evaluation   Exercise Goals Review  Increase Physical Activity;Able to understand and use rate of perceived exertion (RPE) scale;Knowledge and understanding of Target Heart Rate Range (THRR);Understanding of Exercise Prescription;Increase Strength and Stamina;Able to check pulse independently Increase Physical Activity;Able to understand and use rate of perceived exertion (RPE) scale;Knowledge and understanding of  Target Heart Rate Range (THRR);Understanding of Exercise Prescription;Increase Strength and Stamina;Able to check pulse independently      Comments Reviewed home exercise with pt today.  Pt plans to ride road bike for exercise, 2x/week.Discussed how to progress exercise safely. Reviewed THR, pulse, RPE, sign and symptoms, and when to call 911 or MD.  Also discussed weather considerations and indoor options.  Pt voiced understanding. Pt is riding mountain bike, 5-10 miles in distance, without difficulty., 3x/week. Pt is often limited by knee pain      Expected Outcomes Pt will be compliant with HEP and improve in cardiorespiratory fitness and endurance.  Pt will be compliant with HEP and improve in cardiorespiratory fitness and endurance.           Discharge Exercise Prescription (Final Exercise Prescription Changes):     Exercise Prescription Changes - 07/12/17 1500      Response to Exercise   Blood Pressure (Admit) 136/90   Blood Pressure (Exercise) 138/80   Blood Pressure (Exit) 132/70   Heart Rate (Admit) 66 bpm   Heart Rate (Exercise) 86 bpm   Heart Rate (Exit) 75 bpm   Rating of Perceived Exertion (Exercise) 12   Symptoms occ. knee pain with long periods of standing and walking   Comments exercise equiment was adjusted for pt comfort   Duration Continue with 30 min of aerobic exercise without signs/symptoms of physical distress.   Intensity THRR unchanged     Progression   Progression Continue to progress workloads to maintain intensity without signs/symptoms of physical distress.   Average METs 2      Resistance Training   Training Prescription Yes   Weight 3lbs   Reps 10-15   Time 10 Minutes     Bike   Level 0.5   Minutes 10   METs 1.7     NuStep   Level 4   SPM 85   Minutes 10   METs 2.3     Arm Ergometer   Level 2   Minutes 10   METs 1.9     Track   Laps 5   Minutes 10   METs 1.84     Home Exercise Plan   Plans to continue exercise at Home (comment)   Frequency Add 2 additional days to program exercise sessions.   Initial Home Exercises Provided 06/28/17      Nutrition:  Target Goals: Understanding of nutrition guidelines, daily intake of sodium 1500mg , cholesterol 200mg , calories 30% from fat and 7% or less from saturated fats, daily to have 5 or more servings of fruits and vegetables.  Biometrics:     Pre Biometrics - 06/10/17 1544      Pre Biometrics   Height 5' 11.75" (1.822 m)   Weight 294 lb 15.6 oz (133.8 kg)   Waist Circumference 53 inches   Hip Circumference 49 inches   Waist to Hip Ratio 1.08 %   BMI (Calculated) 40.31   Triceps Skinfold 25 mm   % Body Fat 40 %   Grip Strength 38 kg   Flexibility 9.5 in   Single Leg Stand 2.55 seconds       Nutrition Therapy Plan and Nutrition Goals:     Nutrition Therapy & Goals - 06/10/17 1553      Nutrition Therapy   Diet Carb Modified, Therapeutic Lifestyle Changes     Personal Nutrition Goals   Nutrition Goal Wt loss of 1-2 lb/week to a wt loss goal of 6-24 lb while in Cardiac  Rehab. Goal wt loss is 30 lb.      Intervention Plan   Intervention Prescribe, educate and counsel regarding individualized specific dietary modifications aiming towards targeted core components such as weight, hypertension, lipid management, diabetes, heart failure and other comorbidities.   Expected Outcomes Short Term Goal: Understand basic principles of dietary content, such as calories, fat, sodium, cholesterol and nutrients.;Long Term Goal: Adherence to prescribed nutrition plan.      Nutrition Discharge:  Nutrition Scores:     Nutrition Assessments - 06/10/17 1553      MEDFICTS Scores   Pre Score 9      Nutrition Goals Re-Evaluation:     Nutrition Goals Re-Evaluation    Row Name 07/05/17 0915             Goals   Current Weight 297 lb 9.9 oz (135 kg)       Nutrition Goal Wt loss of 1-2 lb/week to a wt loss goal of 6-24 lb while in Cardiac Rehab. Goal wt loss is 30 lb.        Comment Pt struggling with wt loss. Pt is eating healthier. Pt feels extra calories coming from beer.  See RD note for details.        Expected Outcome Wt loss of 1-2 lb/week to a wt loss goal of 6-24 lb while in Cardiac Rehab. Goal wt loss is 30 lb.           Nutrition Goals Re-Evaluation:     Nutrition Goals Re-Evaluation    Row Name 07/05/17 0915             Goals   Current Weight 297 lb 9.9 oz (135 kg)       Nutrition Goal Wt loss of 1-2 lb/week to a wt loss goal of 6-24 lb while in Cardiac Rehab. Goal wt loss is 30 lb.        Comment Pt struggling with wt loss. Pt is eating healthier. Pt feels extra calories coming from beer.  See RD note for details.        Expected Outcome Wt loss of 1-2 lb/week to a wt loss goal of 6-24 lb while in Cardiac Rehab. Goal wt loss is 30 lb.           Nutrition Goals Discharge (Final Nutrition Goals Re-Evaluation):     Nutrition Goals Re-Evaluation - 07/05/17 0915      Goals   Current Weight 297 lb 9.9 oz (135 kg)   Nutrition Goal Wt loss of 1-2 lb/week to a wt loss goal of 6-24 lb while in Cardiac Rehab. Goal wt loss is 30 lb.    Comment Pt struggling with wt loss. Pt is eating healthier. Pt feels extra calories coming from beer.  See RD note for details.    Expected Outcome Wt loss of 1-2 lb/week to a wt loss goal of 6-24 lb while in Cardiac Rehab. Goal wt loss is 30 lb.       Psychosocial: Target Goals: Acknowledge presence or absence of significant depression and/or stress, maximize coping skills, provide positive support system. Participant is able  to verbalize types and ability to use techniques and skills needed for reducing stress and depression.  Initial Review & Psychosocial Screening:     Initial Psych Review & Screening - 06/10/17 1534      Initial Review   Current issues with None Identified     Family Dynamics   Good Support System? Yes  Barriers   Psychosocial barriers to participate in program There are no identifiable barriers or psychosocial needs.     Screening Interventions   Interventions Encouraged to exercise      Quality of Life Scores:     Quality of Life - 06/25/17 1541      Quality of Life Scores   Health/Function Pre 23.47 %   Socioeconomic Pre 28.75 %   Psych/Spiritual Pre 28.07 %   Family Pre 28.8 %   GLOBAL Pre 26.36 %  QOL scores reveiwed with pt. pt concerns health related. pt denies specific problems and is eager to regain his health and stamina. pt encouraged to continue staff recommendation for diet and exercise to increase ability to achieve goals.        PHQ-9: Recent Review Flowsheet Data    Depression screen [facility] 2/9 06/14/2017   Decreased Interest 0   Down, Depressed, Hopeless 0   PHQ - 2 Score 0     Interpretation of Total Score  Total Score Depression Severity:  1-4 = Minimal depression, 5-9 = Mild depression, 10-14 = Moderate depression, 15-19 = Moderately severe depression, 20-27 = Severe depression   Psychosocial Evaluation and Intervention:     Psychosocial Evaluation - 06/14/17 1651      Psychosocial Evaluation & Interventions   Interventions Encouraged to exercise with the program and follow exercise prescription;Stress management education;Relaxation education   Comments upon brief assessment, no psychosocial needs identified, no interventions necessary    Expected Outcomes pt will exhibit positive outlook with good coping skills.    Continue Psychosocial Services  No Follow up required      Psychosocial Re-Evaluation:     Psychosocial Re-Evaluation     [place] Name 06/23/17 1031 07/20/17 0816           Psychosocial Re-Evaluation   Current issues with None Identified None Identified      Comments no psychoosocial needs identified, no interventions necessary  no psychoosocial needs identified, no interventions necessary       Expected Outcomes pt will exhibit positive outlook with good coping skills.  pt will exhibit positive outlook with good coping skills.       Interventions Relaxation education;Stress management education;Encouraged to attend Cardiac Rehabilitation for the exercise Relaxation education;Stress management education;Encouraged to attend Cardiac Rehabilitation for the exercise      Continue Psychosocial Services  No Follow up required No Follow up required         Psychosocial Discharge (Final Psychosocial Re-Evaluation):     Psychosocial Re-Evaluation - 07/20/17 0816      Psychosocial Re-Evaluation   Current issues with None Identified   Comments no psychoosocial needs identified, no interventions necessary    Expected Outcomes pt will exhibit positive outlook with good coping skills.    Interventions Relaxation education;Stress management education;Encouraged to attend Cardiac Rehabilitation for the exercise   Continue Psychosocial Services  No Follow up required      Vocational Rehabilitation: Provide vocational rehab assistance to qualifying candidates.   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 Participant will state understanding/return demonstration of topics presented.  Learning Barriers/Preferences:     Learning Barriers/Preferences - 06/10/17  1108      Learning  Barriers/Preferences   Learning Barriers Sight   Learning Preferences Pictoral;Video;Skilled Demonstration      Education Topics: Count Your Pulse:  -Group instruction provided by verbal instruction, demonstration, patient participation and written materials to support subject.  Instructors address importance of being able to find your pulse and how to count your pulse when at home without a heart monitor.  Patients get hands on experience counting their pulse with staff help and individually.   Heart Attack, Angina, and Risk Factor Modification:  -Group instruction provided by verbal instruction, video, and written materials to support subject.  Instructors address signs and symptoms of angina and heart attacks.    Also discuss risk factors for heart disease and how to make changes to improve heart health risk factors.   Functional Fitness:  -Group instruction provided by verbal instruction, demonstration, patient participation, and written materials to support subject.  Instructors address safety measures for doing things around the house.  Discuss how to get up and down off the floor, how to pick things up properly, how to safely get out of a chair without assistance, and balance training.   CARDIAC REHAB PHASE II EXERCISE from 07/16/2017 in [place]  Date  07/09/17  Instruction Review Code  2- meets goals/outcomes      Meditation and Mindfulness:  -Group instruction provided by verbal instruction, patient participation, and written materials to support subject.  Instructor addresses importance of mindfulness and meditation practice to help reduce stress and improve awareness.  Instructor also leads participants through a meditation exercise.    CARDIAC REHAB PHASE II EXERCISE from 07/16/2017 in [place]  Date  06/23/17  Instruction Review Code  2- meets goals/outcomes      Stretching for Flexibility and Mobility:   -Group instruction provided by verbal instruction, patient participation, and written materials to support subject.  Instructors lead participants through series of stretches that are designed to increase flexibility thus improving mobility.  These stretches are additional exercise for major muscle groups that are typically performed during regular warm up and cool down.   CARDIAC REHAB PHASE II EXERCISE from 07/16/2017 in [place]  Date  07/14/17  Instruction Review Code  2- meets goals/outcomes      Hands Only CPR:  -Group verbal, video, and participation provides a basic overview of AHA guidelines for community CPR. Role-play of emergencies allow participants the opportunity to practice calling for help and chest compression technique with discussion of AED use.   Hypertension: -Group verbal and written instruction that provides a basic overview of hypertension including the most recent diagnostic guidelines, risk factor reduction with self-care instructions and medication management.   CARDIAC REHAB PHASE II EXERCISE from 07/16/2017 in [place]  Date  06/18/17  Instruction Review Code  2- meets goals/outcomes       Nutrition I class: Heart Healthy Eating:  -Group instruction provided by PowerPoint slides, verbal discussion, and written materials to support subject matter. The instructor gives an explanation and review of the Therapeutic Lifestyle Changes diet recommendations, which includes a discussion on lipid goals, dietary fat, sodium, fiber, plant stanol/sterol esters, sugar, and the components of a well-balanced, healthy diet.   Nutrition II class: Lifestyle Skills:  -Group instruction provided by PowerPoint slides, verbal discussion, and written materials to support subject matter. The instructor gives an explanation and review of label reading, grocery shopping  for heart health, heart healthy recipe  modifications, and ways to make healthier choices when eating out.   Diabetes Question & Answer:  -Group instruction provided by PowerPoint slides, verbal discussion, and written materials to support subject matter. The instructor gives an explanation and review of diabetes co-morbidities, pre- and post-prandial blood glucose goals, pre-exercise blood glucose goals, signs, symptoms, and treatment of hypoglycemia and hyperglycemia, and foot care basics.   Diabetes Blitz:  -Group instruction provided by PowerPoint slides, verbal discussion, and written materials to support subject matter. The instructor gives an explanation and review of the physiology behind type 1 and type 2 diabetes, diabetes medications and rational behind using different medications, pre- and post-prandial blood glucose recommendations and Hemoglobin A1c goals, diabetes diet, and exercise including blood glucose guidelines for exercising safely.    Portion Distortion:  -Group instruction provided by PowerPoint slides, verbal discussion, written materials, and food models to support subject matter. The instructor gives an explanation of serving size versus portion size, changes in portions sizes over the last 20 years, and what consists of a serving from each food group.   Stress Management:  -Group instruction provided by verbal instruction, video, and written materials to support subject matter.  Instructors review role of stress in heart disease and how to cope with stress positively.     CARDIAC REHAB PHASE II EXERCISE from 07/16/2017 in [place]  Date  07/16/17  Instruction Review Code  2- meets goals/outcomes      Exercising on Your Own:  -Group instruction provided by verbal instruction, power point, and written materials to support subject.  Instructors discuss benefits of exercise, components of exercise, frequency and intensity of exercise, and end points for exercise.  Also  discuss use of nitroglycerin and activating EMS.  Review options of places to exercise outside of rehab.  Review guidelines for sex with heart disease.   Cardiac Drugs I:  -Group instruction provided by verbal instruction and written materials to support subject.  Instructor reviews cardiac drug classes: antiplatelets, anticoagulants, beta blockers, and statins.  Instructor discusses reasons, side effects, and lifestyle considerations for each drug class.   Cardiac Drugs II:  -Group instruction provided by verbal instruction and written materials to support subject.  Instructor reviews cardiac drug classes: angiotensin converting enzyme inhibitors (ACE-I), angiotensin II receptor blockers (ARBs), nitrates, and calcium channel blockers.  Instructor discusses reasons, side effects, and lifestyle considerations for each drug class.   CARDIAC REHAB PHASE II EXERCISE from 07/16/2017 in [place]  Date  06/30/17  Instruction Review Code  2- meets goals/outcomes      Anatomy and Physiology of the Circulatory System:  Group verbal and written instruction and models provide basic cardiac anatomy and physiology, with the coronary electrical and arterial systems. Review of: AMI, Angina, Valve disease, Heart Failure, Peripheral Artery Disease, Cardiac Arrhythmia, Pacemakers, and the ICD.   Other Education:  -Group or individual verbal, written, or video instructions that support the educational goals of the cardiac rehab program.   Knowledge Questionnaire Score:     Knowledge Questionnaire Score - 06/10/17 1426      Knowledge Questionnaire Score   Pre Score 21/24      Core Components/Risk Factors/Patient Goals at Admission:     Personal Goals and Risk Factors at Admission - 06/10/17 1547      Core Components/Risk Factors/Patient Goals on Admission   Intervention Weight Management: Develop a combined nutrition and exercise program designed to  reach desired  caloric intake, while maintaining appropriate intake of nutrient and fiber, sodium and fats, and appropriate energy expenditure required for the weight goal.;Weight Management: Provide education and appropriate resources to help participant work on and attain dietary goals.;Obesity: Provide education and appropriate resources to help participant work on and attain dietary goals.;Weight Management/Obesity: Establish reasonable short term and long term weight goals.   Admit Weight 294 lb 15.6 oz (133.8 kg)   Goal Weight: Short Term 284 lb (128.8 kg)   Goal Weight: Long Term 264 lb (119.7 kg)   Expected Outcomes Short Term: Continue to assess and modify interventions until short term weight is achieved;Long Term: Adherence to nutrition and physical activity/exercise program aimed toward attainment of established weight goal;Weight Maintenance: Understanding of the daily nutrition guidelines, which includes 25-35% calories from fat, 7% or less cal from saturated fats, less than 200mg  cholesterol, less than 1.5gm of sodium, & 5 or more servings of fruits and vegetables daily;Weight Loss: Understanding of general recommendations for a balanced deficit meal plan, which promotes 1-2 lb weight loss per week and includes a negative energy balance of [phone] kcal/d;Understanding recommendations for meals to include 15-35% energy as protein, 25-35% energy from fat, 35-60% energy from carbohydrates, less than 200mg  of dietary cholesterol, 20-35 gm of total fiber daily;Understanding of distribution of calorie intake throughout the day with the consumption of 4-5 meals/snacks   Tobacco Cessation Yes   Intervention Assist the participant in steps to quit. Provide individualized education and counseling about committing to Tobacco Cessation, relapse prevention, and pharmacological support that can be provided by physician.;Advice worker, assist with locating and accessing local/national Quit Smoking programs,  and support quit date choice.   Expected Outcomes Short Term: Will demonstrate readiness to quit, by selecting a quit date.;Short Term: Will quit all tobacco product use, adhering to prevention of relapse plan.;Long Term: Complete abstinence from all tobacco products for at least 12 months from quit date.   Diabetes Yes   Intervention Provide education about signs/symptoms and action to take for hypo/hyperglycemia.;Provide education about proper nutrition, including hydration, and aerobic/resistive exercise prescription along with prescribed medications to achieve blood glucose in normal ranges: Fasting glucose 65-99 mg/dL   Expected Outcomes Short Term: Participant verbalizes understanding of the signs/symptoms and immediate care of hyper/hypoglycemia, proper foot care and importance of medication, aerobic/resistive exercise and nutrition plan for blood glucose control.;Long Term: Attainment of HbA1C < 7%.   Hypertension Yes   Intervention Provide education on lifestyle modifcations including regular physical activity/exercise, weight management, moderate sodium restriction and increased consumption of fresh fruit, vegetables, and low fat dairy, alcohol moderation, and smoking cessation.;Monitor prescription use compliance.   Expected Outcomes Short Term: Continued assessment and intervention until BP is < 140/53mm HG in hypertensive participants. < 130/41mm HG in hypertensive participants with diabetes, heart failure or chronic kidney disease.;Long Term: Maintenance of blood pressure at goal levels.   Lipids Yes   Intervention Provide education and support for participant on nutrition & aerobic/resistive exercise along with prescribed medications to achieve LDL 70mg , HDL >40mg .   Expected Outcomes Short Term: Participant states understanding of desired cholesterol values and is compliant with medications prescribed. Participant is following exercise prescription and nutrition guidelines.;Long Term:  Cholesterol controlled with medications as prescribed, with individualized exercise RX and with personalized nutrition plan. Value goals: LDL < 70mg , HDL > 40 mg.      Core Components/Risk Factors/Patient Goals Review:      Goals and Risk Factor Review  [place] Name 06/23/17 1029 07/20/17 0816 07/23/17 1137         Core Components/Risk Factors/Patient Goals Review   Personal Goals Review Weight Management/Obesity;Tobacco Cessation;Hypertension;Lipids;Diabetes Weight Management/Obesity;Tobacco Cessation;Hypertension;Lipids;Diabetes Weight Management/Obesity;Tobacco Cessation;Hypertension;Lipids;Diabetes     Review pt with multiple CAD RF demonstrates eagerness to participate in CR activities.  pt continues in determination phase of tobacco cessation and activity progression.   pt with multiple CAD RF demonstrates eagerness to participate in CR activities.  pt continues in determination phase of tobacco cessation and activity progression.   pt with multiple CAD RF demonstrates eagerness to participate in CR activities.  pt encouraged by his increased flexability and increased stamina. pt continues in determination phase of tobacco cessation and activity progression.  pt able to walk further distance without DOE.  pt also notes improved dietary habits.      Expected Outcomes pt will continue CR exercise, nutrition and lifestyle modification to reduce overall RF.  pt will continue CR exercise, nutrition and lifestyle modification to reduce overall RF.  pt will continue CR exercise, nutrition and lifestyle modification to reduce overall RF.         Core Components/Risk Factors/Patient Goals at Discharge (Final Review):      Goals and Risk Factor Review - 07/23/17 1137      Core Components/Risk Factors/Patient Goals Review   Personal Goals Review Weight Management/Obesity;Tobacco Cessation;Hypertension;Lipids;Diabetes   Review pt with multiple CAD RF demonstrates eagerness to participate in CR  activities.  pt encouraged by his increased flexability and increased stamina. pt continues in determination phase of tobacco cessation and activity progression.  pt able to walk further distance without DOE.  pt also notes improved dietary habits.    Expected Outcomes pt will continue CR exercise, nutrition and lifestyle modification to reduce overall RF.       ITP Comments:     ITP Comments    Row Name 06/10/17 1102 06/29/17 1550 07/20/17 0816       ITP Comments Dr Fransico Him, Medical Director 30 day ITP review.  Pt with good participation and attendance.  No change in current regimen unless directed by Medical Director.   30 day ITP review.  Pt with good participation and attendance.  No change in current regimen unless directed by Medical Director.          Comments:

## 2017-07-23 NOTE — Assessment & Plan Note (Signed)
David Frost complains of bilateral leg pain especially when walking up and down stairs. Its entirely possible that this is related to peripheral arterial disease although I can't palpate pulses. It could also be related to statin intolerance. I'm going to get lower extremity arterial Doppler studies of these are normal we'll give him a statin holiday.

## 2017-07-23 NOTE — Assessment & Plan Note (Signed)
History of CAD status post coronary artery bypass grafting 6 by Dr. Servando Snare 03/22/17 for multivessel CAD. He did havea non-STEMI in [place] who was transferred to newborn or he had cardiac catheterization. He has done well postop. He has more energy than he had a long time. He denies chest pain or shortness of breath.

## 2017-07-23 NOTE — Patient Instructions (Addendum)
Medication Instructions: Your physician recommends that you continue on your current medications as directed. Please refer to the Current Medication list given to you today.  Decrease Amiodarone to 100 mg daily.  STOP Xarelto  STOP Atorvastatin for 6 weeks--if pain in legs goes away, we will consider starting a new statin. If you experience leg pain with the new statin, we will consider starting PCSK9 therapy.  Co Q 10--100 mg   Testing/Procedures: Your physician has requested that you have a lower extremity arterial duplex. During this test, ultrasound is used to evaluate arterial blood flow in the legs. Allow one hour for this exam. There are no restrictions or special instructions.  Your physician has requested that you have an ankle brachial index (ABI). During this test an ultrasound and blood pressure cuff are used to evaluate the arteries that supply the arms and legs with blood. Allow thirty minutes for this exam. There are no restrictions or special instructions.  Follow-Up: We request that you follow-up in: 2 months with an extender and in 6 months with Dr Andria Rhein will receive a reminder letter in the mail two months in advance. If you don't receive a letter, please call our office to schedule the follow-up appointment.  If you need a refill on your cardiac medications before your next appointment, please call your pharmacy.

## 2017-07-23 NOTE — Progress Notes (Signed)
07/23/2017 David Frost   1951-03-28  [id number]  Primary Physician [name], MD Primary Cardiologist: Lorretta Harp MD [name], [place]  HPI:  David Frost is a 66 y.o. male severely overweight divorced Caucasian male father of 2 sons [name] and [name])  who I last saw 04/21/17. He is accompanied by his wife [name]. He has a history of obesity, hypertension, chronic right bundle branch block and symptoms compatible sleep apnea. He has obstructive sleep apnea on CPAP. He does admit to dietary indiscretion. He says that he loved bagels and salt. He had a Myoview stress test in 2007 which was low risk and a 2-D echo to and 10 that showed normal LV function. He had a non-STEMI in [place] 03/15/17 and was transferred to [facility] where he underwent cardiac catheterization revealing multivessel disease. He was then transferred to [facility] where he underwent coronary artery bypass grafting 6 by Dr. Servando Snare on 03/22/17. His postoperative course was uncomplicated. Soon after discharge she was remitted with A. fib with RVR from which he was symptomatic from and was rate controlled, placed on amiodarone and oral anticoagulation. On 05/04/17 he underwent outpatient DC cardioversion by Dr. Oval Linsey successfully to sinus rhythm with one shock. He has done well since. He says he has more energy than he had a long time. Exercising on a regular basis. He is watching his weight has changed his diet dramatically. His goal is to lose 25-30 pounds. He is sleeping throughout the night and does wear his CPAP.  Current Meds  Medication Sig  . acetaminophen (TYLENOL) 325 MG tablet Take 2 tablets (650 mg total) by mouth every 6 (six) hours as needed for mild pain.  Marland Kitchen ALPRAZolam (XANAX) 0.25 MG tablet Take 1 tablet (0.25 mg total) by mouth 2 (two) times daily as needed for anxiety.  Marland Kitchen amiodarone (PACERONE) 100 MG tablet Take 1 tablet (100 mg total) by mouth daily.  Marland Kitchen  atorvastatin (LIPITOR) 80 MG tablet Take 1 tablet (80 mg total) by mouth daily at 6 PM.  . brimonidine-timolol (COMBIGAN) 0.2-0.5 % ophthalmic solution Place 1 drop into both eyes 2 (two) times daily.   . Canagliflozin (INVOKANA) 300 MG TABS Take 300 mg by mouth daily.  . metoprolol succinate (TOPROL-XL) 50 MG 24 hr tablet Take 1 tablet (50 mg total) by mouth daily.  . Multiple Vitamin (MULTIVITAMIN WITH MINERALS) TABS tablet Take 1 tablet by mouth daily.  . nitroGLYCERIN (NITROSTAT) 0.4 MG SL tablet Place 1 tablet (0.4 mg total) under the tongue every 5 (five) minutes as needed for chest pain.  Marland Kitchen omeprazole (PRILOSEC) 40 MG capsule Take 1 capsule (40 mg total) by mouth daily.  . Polyethylene Glycol 3350 (MIRALAX PO) Take 1 Dose by mouth every other day.   Marland Kitchen PROAIR RESPICLICK 505 (90 Base) MCG/ACT AEPB Take 2 puffs by mouth as directed.  . [DISCONTINUED] amiodarone (PACERONE) 200 MG tablet Take 1 tablet by mouth daily.  . [DISCONTINUED] amiodarone (PACERONE) 200 MG tablet TAKE 1 TABLET(200 MG) BY MOUTH DAILY  . [DISCONTINUED] XARELTO 20 MG TABS tablet Take 1 tablet (20 mg total) by mouth daily.     No Known Allergies  Social History   Social History  . Marital status: Legally Separated    Spouse name: N/A  . Number of children: N/A  . Years of education: N/A   Occupational History  . Not on file.   Social History Main Topics  .  Smoking status: Former Smoker    Years: 2.00    Types: Cigars    Quit date: 03/08/2017  . Smokeless tobacco: Never Used  . 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 Drug use: No  . Sexual activity: Not on file   Other Topics Concern  . Not on file   Social History Narrative  . No narrative on file     Review of Systems: General: negative for chills, fever, night sweats or weight changes.  Cardiovascular: negative for chest pain, dyspnea on exertion, edema, orthopnea, palpitations, paroxysmal nocturnal dyspnea or shortness of  breath Dermatological: negative for rash Respiratory: negative for cough or wheezing Urologic: negative for hematuria Abdominal: negative for nausea, vomiting, diarrhea, bright red blood per rectum, melena, or hematemesis Neurologic: negative for visual changes, syncope, or dizziness All other systems reviewed and are otherwise negative except as noted above.    Blood pressure 135/68, pulse 65, height 6\' 2"  (1.88 m), weight 292 lb 12.8 oz (132.8 kg).  General appearance: alert and no distress Neck: no adenopathy, no carotid bruit, no JVD, supple, symmetrical, trachea midline and thyroid not enlarged, symmetric, no tenderness/mass/nodules Lungs: clear to auscultation bilaterally Heart: regular rate and rhythm, S1, S2 normal, no murmur, click, rub or gallop Extremities: ild edema bilaterally with some venostasis changes. Pulses: mild edema bilaterally with some venous stasis changes. Skin: venous stasis changes. Neurologic: Alert and oriented X 3, normal strength and tone. Normal symmetric reflexes. Normal coordination and gait  EKG normal sinus rhythm at 60 with right bundle-branch block and anterior fascicular block. I personally reviewed this EKG.  ASSESSMENT AND PLAN:   Essential hypertension History of essential hypertension blood pressure measured 135/68. He is on metoprolol. Continue current meds at current dosing  Obstructive sleep apnea History of obstructive sleep apnea on CPAP which he benefits from  CAD, multiple vessel History of CAD status post coronary artery bypass grafting 6 by Dr. Servando Snare 03/22/17 for multivessel CAD. He did havea non-STEMI in [place] who was transferred to newborn or he had cardiac catheterization. He has done well postop. He has more energy than he had a long time. He denies chest pain or shortness of breath.  Atrial fibrillation with RVR (HCC) History of PAF in the perioperative period status post outpatient DC cardioversion by Dr.  Oval Linsey 05/04/17. He was shocked 150 J successfully.he remains on amiodarone 200 mg a day and Xarelto . i'm going to check an EKG today. If he is in sinus rhythm we will discontinue his oral anticoagulation and cut his amiodarone from 200-100 mg a day.  Hyperlipidemia LDL goal <70 History of hyperlipidemia on high-dose statin therapy with recent liver profile performed 03/18/17 revealing a total cholesterol of 190, LDL of 95 and HDL of 29. He does complain of some pain in his legs when he walks which may be statin myopathy versus PAD. I'm going to check lower extremity arterial Doppler studies of these are normal I will put him on a statin holiday for 6 weeks. If his symptoms improve I'm going to change him from atorvastatin to pravastatin. I am going to put him on Co Q 10 200 mg a day. If he has statin intolerance to pravastatin we will consider a PCS K9 monoclonal  Bilateral leg pain Mr. Hrdlicka complains of bilateral leg pain especially when walking up and down stairs. Its entirely possible that this is related to peripheral arterial disease although I can't  palpate pulses. It could also be related to statin intolerance. I'm going to get lower extremity arterial Doppler studies of these are normal we'll give him a statin holiday.      Lorretta Harp MD FACP,FACC,FAHA, [facility] [place] [facility] 07/23/2017 8:43 AM

## 2017-07-23 NOTE — Assessment & Plan Note (Signed)
History of PAF in the perioperative period status post outpatient DC cardioversion by Dr. Oval Linsey 05/04/17. He was shocked 150 J successfully.he remains on amiodarone 200 mg a day and Xarelto . i'm going to check an EKG today. If he is in sinus rhythm we will discontinue his oral anticoagulation and cut his amiodarone from 200-100 mg a day.

## 2017-07-23 NOTE — Assessment & Plan Note (Signed)
History of hyperlipidemia on high-dose statin therapy with recent liver profile performed 03/18/17 revealing a total cholesterol of 190, LDL of 95 and HDL of 29. He does complain of some pain in his legs when he walks which may be statin myopathy versus PAD. I'm going to check lower extremity arterial Doppler studies of these are normal I will put him on a statin holiday for 6 weeks. If his symptoms improve I'm going to change him from atorvastatin to pravastatin. I am going to put him on Co Q 10 200 mg a day. If he has statin intolerance to pravastatin we will consider a PCS K9 monoclonal

## 2017-07-26 ENCOUNTER — Encounter (HOSPITAL_COMMUNITY)
Admission: RE | Admit: 2017-07-26 | Discharge: 2017-07-26 | Disposition: A | Discharge status: Home / Self Care | Payer: Medicare Other | Source: Ambulatory Visit | Attending: Cardiovascular Disease | Admitting: Cardiovascular Disease

## 2017-07-26 DIAGNOSIS — Z951 Presence of aortocoronary bypass graft: Secondary | ICD-10-CM

## 2017-07-26 DIAGNOSIS — Z48812 Encounter for surgical aftercare following surgery on the circulatory system: Secondary | ICD-10-CM | POA: Diagnosis not present

## 2017-07-28 ENCOUNTER — Encounter (HOSPITAL_COMMUNITY)
Admission: RE | Admit: 2017-07-28 | Discharge: 2017-07-28 | Disposition: A | Discharge status: Home / Self Care | Payer: Medicare Other | Source: Ambulatory Visit | Attending: Cardiovascular Disease | Admitting: Cardiovascular Disease

## 2017-07-28 DIAGNOSIS — Z951 Presence of aortocoronary bypass graft: Secondary | ICD-10-CM

## 2017-07-28 DIAGNOSIS — Z48812 Encounter for surgical aftercare following surgery on the circulatory system: Secondary | ICD-10-CM | POA: Diagnosis not present

## 2017-07-28 LAB — GLUCOSE, CAPILLARY: Glucose-Capillary: 129 mg/dL — ABNORMAL HIGH (ref 65–99)

## 2017-07-30 ENCOUNTER — Encounter (HOSPITAL_COMMUNITY)
Admission: RE | Admit: 2017-07-30 | Discharge: 2017-07-30 | Disposition: A | Discharge status: Home / Self Care | Payer: Medicare Other | Source: Ambulatory Visit | Attending: Cardiovascular Disease | Admitting: Cardiovascular Disease

## 2017-07-30 DIAGNOSIS — Z48812 Encounter for surgical aftercare following surgery on the circulatory system: Secondary | ICD-10-CM | POA: Diagnosis not present

## 2017-07-30 DIAGNOSIS — Z951 Presence of aortocoronary bypass graft: Secondary | ICD-10-CM | POA: Diagnosis not present

## 2017-08-02 ENCOUNTER — Encounter (HOSPITAL_COMMUNITY)
Admission: RE | Admit: 2017-08-02 | Discharge: 2017-08-02 | Disposition: A | Discharge status: Home / Self Care | Payer: Medicare Other | Source: Ambulatory Visit | Attending: Cardiovascular Disease | Admitting: Cardiovascular Disease

## 2017-08-02 DIAGNOSIS — Z951 Presence of aortocoronary bypass graft: Secondary | ICD-10-CM | POA: Diagnosis not present

## 2017-08-02 DIAGNOSIS — Z48812 Encounter for surgical aftercare following surgery on the circulatory system: Secondary | ICD-10-CM | POA: Diagnosis not present

## 2017-08-04 ENCOUNTER — Encounter (HOSPITAL_COMMUNITY): Payer: Medicare Other

## 2017-08-04 DIAGNOSIS — M1712 Unilateral primary osteoarthritis, left knee: Secondary | ICD-10-CM | POA: Diagnosis not present

## 2017-08-04 DIAGNOSIS — M25562 Pain in left knee: Secondary | ICD-10-CM | POA: Diagnosis not present

## 2017-08-04 DIAGNOSIS — M25561 Pain in right knee: Secondary | ICD-10-CM | POA: Diagnosis not present

## 2017-08-04 DIAGNOSIS — G8929 Other chronic pain: Secondary | ICD-10-CM | POA: Diagnosis not present

## 2017-08-05 ENCOUNTER — Ambulatory Visit (HOSPITAL_COMMUNITY)
Admission: RE | Admit: 2017-08-05 | Discharge: 2017-08-05 | Disposition: A | Discharge status: Home / Self Care | Payer: Medicare Other | Source: Ambulatory Visit | Attending: Cardiovascular Disease | Admitting: Cardiovascular Disease

## 2017-08-05 DIAGNOSIS — Z87891 Personal history of nicotine dependence: Secondary | ICD-10-CM | POA: Diagnosis not present

## 2017-08-05 DIAGNOSIS — I10 Essential (primary) hypertension: Secondary | ICD-10-CM | POA: Insufficient documentation

## 2017-08-05 DIAGNOSIS — I2510 Atherosclerotic heart disease of native coronary artery without angina pectoris: Secondary | ICD-10-CM | POA: Insufficient documentation

## 2017-08-05 DIAGNOSIS — I739 Peripheral vascular disease, unspecified: Secondary | ICD-10-CM | POA: Diagnosis not present

## 2017-08-05 DIAGNOSIS — E1151 Type 2 diabetes mellitus with diabetic peripheral angiopathy without gangrene: Secondary | ICD-10-CM | POA: Insufficient documentation

## 2017-08-06 ENCOUNTER — Encounter (HOSPITAL_COMMUNITY): Payer: Medicare Other

## 2017-08-09 ENCOUNTER — Encounter (HOSPITAL_COMMUNITY): Payer: Medicare Other

## 2017-08-11 ENCOUNTER — Encounter (HOSPITAL_COMMUNITY)
Admission: RE | Admit: 2017-08-11 | Discharge: 2017-08-11 | Disposition: A | Discharge status: Home / Self Care | Payer: Medicare Other | Source: Ambulatory Visit | Attending: Cardiovascular Disease | Admitting: Cardiovascular Disease

## 2017-08-11 DIAGNOSIS — Z951 Presence of aortocoronary bypass graft: Secondary | ICD-10-CM | POA: Diagnosis not present

## 2017-08-11 DIAGNOSIS — Z48812 Encounter for surgical aftercare following surgery on the circulatory system: Secondary | ICD-10-CM | POA: Diagnosis not present

## 2017-08-13 ENCOUNTER — Encounter (HOSPITAL_COMMUNITY): Payer: Medicare Other

## 2017-08-16 ENCOUNTER — Encounter (HOSPITAL_COMMUNITY)
Admission: RE | Admit: 2017-08-16 | Discharge: 2017-08-16 | Disposition: A | Discharge status: Home / Self Care | Payer: Medicare Other | Source: Ambulatory Visit | Attending: Cardiovascular Disease | Admitting: Cardiovascular Disease

## 2017-08-16 DIAGNOSIS — Z951 Presence of aortocoronary bypass graft: Secondary | ICD-10-CM | POA: Diagnosis not present

## 2017-08-16 DIAGNOSIS — Z48812 Encounter for surgical aftercare following surgery on the circulatory system: Secondary | ICD-10-CM | POA: Diagnosis not present

## 2017-08-16 LAB — GLUCOSE, CAPILLARY: GLUCOSE-CAPILLARY: 111 mg/dL — AB (ref 65–99)

## 2017-08-18 ENCOUNTER — Encounter (HOSPITAL_COMMUNITY)
Admission: RE | Admit: 2017-08-18 | Discharge: 2017-08-18 | Disposition: A | Discharge status: Home / Self Care | Payer: Medicare Other | Source: Ambulatory Visit | Attending: Cardiovascular Disease | Admitting: Cardiovascular Disease

## 2017-08-18 DIAGNOSIS — Z48812 Encounter for surgical aftercare following surgery on the circulatory system: Secondary | ICD-10-CM | POA: Diagnosis not present

## 2017-08-18 DIAGNOSIS — Z951 Presence of aortocoronary bypass graft: Secondary | ICD-10-CM | POA: Diagnosis not present

## 2017-08-18 LAB — GLUCOSE, CAPILLARY: Glucose-Capillary: 129 mg/dL — ABNORMAL HIGH (ref 65–99)

## 2017-08-20 ENCOUNTER — Encounter (HOSPITAL_COMMUNITY): Payer: Medicare Other

## 2017-08-20 ENCOUNTER — Ambulatory Visit (HOSPITAL_COMMUNITY): Payer: Self-pay | Admitting: Cardiac Rehabilitation

## 2017-08-20 DIAGNOSIS — Z951 Presence of aortocoronary bypass graft: Secondary | ICD-10-CM

## 2017-08-20 NOTE — Progress Notes (Signed)
Cardiac Individual Treatment Plan  Patient Details  Name: David Frost MRN: [id number] Date of Birth: [birth year] Referring Provider:     CARDIAC REHAB PHASE II ORIENTATION from 06/10/2017 in [place]  Referring Provider  Quay Burow MD      Initial Encounter Date:    CARDIAC REHAB PHASE II ORIENTATION from 06/10/2017 in [place]  Date  06/10/17  Referring Provider  Quay Burow MD      Visit Diagnosis: S/P CABG x 6  Patient's Home Medications on Admission:  Current Outpatient Medications:  .  acetaminophen (TYLENOL) 325 MG tablet, Take 2 tablets (650 mg total) by mouth every 6 (six) hours as needed for mild pain., Disp: , Rfl:  .  ALPRAZolam (XANAX) 0.25 MG tablet, Take 1 tablet (0.25 mg total) by mouth 2 (two) times daily as needed for anxiety., Disp: 14 tablet, Rfl: 0 .  amiodarone (PACERONE) 100 MG tablet, Take 1 tablet (100 mg total) by mouth daily., Disp: 30 tablet, Rfl: 6 .  atorvastatin (LIPITOR) 80 MG tablet, Take 1 tablet (80 mg total) by mouth daily at 6 PM., Disp: 30 tablet, Rfl: 3 .  brimonidine-timolol (COMBIGAN) 0.2-0.5 % ophthalmic solution, Place 1 drop into both eyes 2 (two) times daily. , Disp: , Rfl:  .  Canagliflozin (INVOKANA) 300 MG TABS, Take 300 mg by mouth daily., Disp: , Rfl:  .  metoprolol succinate (TOPROL-XL) 50 MG 24 hr tablet, Take 1 tablet (50 mg total) by mouth daily., Disp: 60 tablet, Rfl: 2 .  Multiple Vitamin (MULTIVITAMIN WITH MINERALS) TABS tablet, Take 1 tablet by mouth daily., Disp: , Rfl:  .  nitroGLYCERIN (NITROSTAT) 0.4 MG SL tablet, Place 1 tablet (0.4 mg total) under the tongue every 5 (five) minutes as needed for chest pain., Disp: 25 tablet, Rfl: 3 .  omeprazole (PRILOSEC) 40 MG capsule, Take 1 capsule (40 mg total) by mouth daily., Disp: 30 capsule, Rfl: 11 .  Polyethylene Glycol 3350 (MIRALAX PO), Take 1 Dose by mouth every other day. , Disp: , Rfl:  .  PROAIR  RESPICLICK 829 (90 Base) MCG/ACT AEPB, Take 2 puffs by mouth as directed., Disp: , Rfl: 1  Past Medical History: Past Medical History:  Diagnosis Date  . Anxiety   . Coronary artery disease    a. s/p CABG in 03/2017 with LIMA-LAD, reverse SVG-intermediate, sequential reverse SVG-OM1-dLCx, and sequential reverse SVG-PDA-PLB  . Diabetes mellitus without complication ([place])    type 2  . Dietary indiscretion   . Dyspnea   . GERD (gastroesophageal reflux disease)   . Hypertension   . Obesity   . Obstructive sleep apnea   . PAF (paroxysmal atrial fibrillation) ([place])    a. s/p DCCV in 04/2017  . Right bundle branch block     Tobacco Use: Social History   Tobacco Use  Smoking Status Former Smoker  . Years: 2.00  . Types: Cigars  . Last attempt to quit: 03/08/2017  . Years since quitting: 0.4  Smokeless Tobacco Never Used    Labs: Recent Chemical engineer    Labs for ITP Cardiac and Pulmonary Rehab Latest Ref Rng & Units 03/22/2017 03/22/2017 03/22/2017 03/22/2017 03/23/2017   Cholestrol 0 - 200 mg/dL - - - - -   LDLCALC 0 - 99 mg/dL - - - - -   HDL >40 mg/dL - - - - -   Trlycerides <150 mg/dL - - - - -   Hemoglobin A1c  4.8 - 5.6 % - - - - -   PHART 7.350 - 7.450 7.494(H) 7.438 7.414 - -   PCO2ART 32.0 - 48.0 mmHg 38.9 47.2 50.8(H) - -   HCO3 20.0 - 28.0 mmol/L 29.9(H) 31.6(H) 32.5(H) - -   TCO2 0 - 100 mmol/L 31 33 34 29 27   O2SAT % 99.0 98.0 97.0 - -      Capillary Blood Glucose: Lab Results  Component Value Date   GLUCAP 129 (H) 08/18/2017   GLUCAP 111 (H) 08/16/2017   GLUCAP 129 (H) 07/28/2017   GLUCAP 96 07/16/2017   GLUCAP 116 (H) 07/09/2017     Exercise Target Goals:    Exercise Program Goal: Individual exercise prescription set with THRR, safety & activity barriers. Participant demonstrates ability to understand and report RPE using BORG scale, to self-measure pulse accurately, and to acknowledge the importance of the exercise prescription.  Exercise  Prescription Goal: Starting with aerobic activity 30 plus minutes a day, 3 days per week for initial exercise prescription. Provide home exercise prescription and guidelines that participant acknowledges understanding prior to discharge.  Activity Barriers & Risk Stratification:   6 Minute Walk:   Oxygen Initial Assessment:   Oxygen Re-Evaluation:   Oxygen Discharge (Final Oxygen Re-Evaluation):   Initial Exercise Prescription:   Perform Capillary Blood Glucose checks as needed.  Exercise Prescription Changes: Exercise Prescription Changes    Row Name 06/14/17 1249 06/29/17 1200 07/12/17 1500 08/02/17 1412 08/16/17 1600     Response to Exercise   Blood Pressure (Admit)  146/70  126/82  136/90  130/88  136/84   Blood Pressure (Exercise)  140/84  136/70  138/80  144/80  144/78   Blood Pressure (Exit)  142/74  136/84  132/70  142/70  122/74   Heart Rate (Admit)  80 bpm  64 bpm  66 bpm  62 bpm  62 bpm   Heart Rate (Exercise)  95 bpm  84 bpm  86 bpm  87 bpm  84 bpm   Heart Rate (Exit)  67 bpm  64 bpm  75 bpm  59 bpm  60 bpm   Rating of Perceived Exertion (Exercise)  12  12  12  12  12    Symptoms  fatigue, deconditioned, mild knee pain  occ. knee pain with long periods of standing and walking  occ. knee pain with long periods of standing and walking  occ. knee pain with long periods of standing and walking  -   Comments  pt was oriented to exercise equiment today  exercise equiment was adjusted for pt comfort  exercise equiment was adjusted for pt comfort  exercise equiment was adjusted for pt comfort  -   Duration  Continue with 30 min of aerobic exercise without signs/symptoms of physical distress.  Continue with 30 min of aerobic exercise without signs/symptoms of physical distress.  Continue with 30 min of aerobic exercise without signs/symptoms of physical distress.  Continue with 30 min of aerobic exercise without signs/symptoms of physical distress.  Continue with 30 min of  aerobic exercise without signs/symptoms of physical distress.   Intensity  THRR unchanged  THRR unchanged  THRR unchanged  THRR unchanged  THRR unchanged     Progression   Progression  Continue to progress workloads to maintain intensity without signs/symptoms of physical distress.  Continue to progress workloads to maintain intensity without signs/symptoms of physical distress.  Continue to progress workloads to maintain intensity without signs/symptoms of physical distress.  Continue to  progress workloads to maintain intensity without signs/symptoms of physical distress.  Continue to progress workloads to maintain intensity without signs/symptoms of physical distress.   Average METs  1.9  1.9  2  2  2      Resistance Training   Training Prescription  Yes  Yes  Yes  Yes  Yes   Weight  3lbs  3lbs  3lbs  3lbs  4lbs    Reps  10-15  10-15  10-15  10-15  10-15   Time  10 Minutes  10 Minutes  10 Minutes  10 Minutes  10 Minutes     Bike   Level  0.5  0.5  0.5  0.5  0.5   Minutes  10  10  10  10  10    METs  1.7  1.7  1.7  1.7  1.7     NuStep   Level  3  4  4  4  4    SPM  80  80  85  85  85   Minutes  10  10  10  10  10    METs  2.1  2.1  2.3  2.3  2.4     Arm Ergometer   Level  -  1  2  2  3    Minutes  -  10  10  10  10    METs  -  1.9  1.9  1.9  1.98     Track   Laps  -  5  5  -  -   Minutes  -  10  10  -  -   METs  -  1.84  1.84  -  -     Home Exercise Plan   Plans to continue exercise at  -  Home (comment)  Home (comment)  Home (comment)  Home (comment)   Frequency  -  Add 2 additional days to program exercise sessions.  Add 2 additional days to program exercise sessions.  Add 2 additional days to program exercise sessions.  Add 2 additional days to program exercise sessions.   Initial Home Exercises Provided  -  06/28/17  06/28/17  06/28/17  06/28/17      Exercise Comments: Exercise Comments    Row Name 06/29/17 1247 07/20/17 1406 08/17/17 1403       Exercise Comments   Reviewed METs and goals. Pt is tolerating exercise well. Pt is often times limited with exercise due to knee pain. Otherwise pt is responding well to exercise changes.  Reviewed METs and goals. Pt is tolerating exercise well. Pt is often times limited with exercise due to knee pain. Otherwise pt is responding well to exercise changes.  Reviewed METs and goals. Pt is tolerating exercise well. Pt is often times limited with exercise due to knee pain. Otherwise pt is responding well to exercise changes.        Exercise Goals and Review:   Exercise Goals Re-Evaluation : Exercise Goals Re-Evaluation    Row Name 06/29/17 1248 07/20/17 1406 08/17/17 1402         Exercise Goal Re-Evaluation   Exercise Goals Review  Increase Physical Activity;Able to understand and use rate of perceived exertion (RPE) scale;Knowledge and understanding of Target Heart Rate Range (THRR);Understanding of Exercise Prescription;Increase Strength and Stamina;Able to check pulse independently  Increase Physical Activity;Able to understand and use rate of perceived exertion (RPE) scale;Knowledge and understanding of Target Heart Rate Range (THRR);Understanding of Exercise Prescription;Increase Strength and Stamina;Able  to check pulse independently  Increase Physical Activity;Able to understand and use rate of perceived exertion (RPE) scale;Knowledge and understanding of Target Heart Rate Range (THRR);Understanding of Exercise Prescription;Increase Strength and Stamina;Able to check pulse independently     Comments  Reviewed home exercise with pt today.  Pt plans to ride road bike for exercise, 2x/week.Discussed how to progress exercise safely. Reviewed THR, pulse, RPE, sign and symptoms, and when to call 911 or MD.  Also discussed weather considerations and indoor options.  Pt voiced understanding.  Pt is riding mountain bike, 5-10 miles in distance, without difficulty., 3x/week. Pt is often limited by knee pain  Pt continues to ride  mountain bike for exercise 3x/week for 5 miles. Pt finds the bike to be relaxing and stress free. Pt also enjoys exercising in cardiac rehab in group setting.     Expected Outcomes  Pt will be compliant with HEP and improve in cardiorespiratory fitness and endurance.   Pt will be compliant with HEP and improve in cardiorespiratory fitness and endurance.   Pt will be compliant with HEP and improve in cardiorespiratory fitness and endurance.          Discharge Exercise Prescription (Final Exercise Prescription Changes): Exercise Prescription Changes - 08/16/17 1600      Response to Exercise   Blood Pressure (Admit)  136/84    Blood Pressure (Exercise)  144/78    Blood Pressure (Exit)  122/74    Heart Rate (Admit)  62 bpm    Heart Rate (Exercise)  84 bpm    Heart Rate (Exit)  60 bpm    Rating of Perceived Exertion (Exercise)  12    Duration  Continue with 30 min of aerobic exercise without signs/symptoms of physical distress.    Intensity  THRR unchanged      Progression   Progression  Continue to progress workloads to maintain intensity without signs/symptoms of physical distress.    Average METs  2      Resistance Training   Training Prescription  Yes    Weight  4lbs     Reps  10-15    Time  10 Minutes      Bike   Level  0.5    Minutes  10    METs  1.7      NuStep   Level  4    SPM  85    Minutes  10    METs  2.4      Arm Ergometer   Level  3    Minutes  10    METs  1.98      Home Exercise Plan   Plans to continue exercise at  Home (comment)    Frequency  Add 2 additional days to program exercise sessions.    Initial Home Exercises Provided  06/28/17       Nutrition:  Target Goals: Understanding of nutrition guidelines, daily intake of sodium 1500mg , cholesterol 200mg , calories 30% from fat and 7% or less from saturated fats, daily to have 5 or more servings of fruits and vegetables.  Biometrics:    Nutrition Therapy Plan and Nutrition  Goals:   Nutrition Discharge: Nutrition Scores:   Nutrition Goals Re-Evaluation: Nutrition Goals Re-Evaluation    Row Name 07/05/17 0915             Goals   Current Weight  297 lb 9.9 oz (135 kg)       Nutrition Goal  Wt loss of 1-2 lb/week to  a wt loss goal of 6-24 lb while in Cardiac Rehab. Goal wt loss is 30 lb.        Comment  Pt struggling with wt loss. Pt is eating healthier. Pt feels extra calories coming from beer.  See RD note for details.        Expected Outcome  Wt loss of 1-2 lb/week to a wt loss goal of 6-24 lb while in Cardiac Rehab. Goal wt loss is 30 lb.           Nutrition Goals Re-Evaluation: Nutrition Goals Re-Evaluation    Row Name 07/05/17 0915             Goals   Current Weight  297 lb 9.9 oz (135 kg)       Nutrition Goal  Wt loss of 1-2 lb/week to a wt loss goal of 6-24 lb while in Cardiac Rehab. Goal wt loss is 30 lb.        Comment  Pt struggling with wt loss. Pt is eating healthier. Pt feels extra calories coming from beer.  See RD note for details.        Expected Outcome  Wt loss of 1-2 lb/week to a wt loss goal of 6-24 lb while in Cardiac Rehab. Goal wt loss is 30 lb.           Nutrition Goals Discharge (Final Nutrition Goals Re-Evaluation): Nutrition Goals Re-Evaluation - 07/05/17 0915      Goals   Current Weight  297 lb 9.9 oz (135 kg)    Nutrition Goal  Wt loss of 1-2 lb/week to a wt loss goal of 6-24 lb while in Cardiac Rehab. Goal wt loss is 30 lb.     Comment  Pt struggling with wt loss. Pt is eating healthier. Pt feels extra calories coming from beer.  See RD note for details.     Expected Outcome  Wt loss of 1-2 lb/week to a wt loss goal of 6-24 lb while in Cardiac Rehab. Goal wt loss is 30 lb.        Psychosocial: Target Goals: Acknowledge presence or absence of significant depression and/or stress, maximize coping skills, provide positive support system. Participant is able to verbalize types and ability to use techniques and  skills needed for reducing stress and depression.  Initial Review & Psychosocial Screening:   Quality of Life Scores: Quality of Life - 06/25/17 1541      Quality of Life Scores   Health/Function Pre  23.47 %    Socioeconomic Pre  28.75 %    Psych/Spiritual Pre  28.07 %    Family Pre  28.8 %    GLOBAL Pre  26.36 % QOL scores reveiwed with pt. pt concerns health related. pt denies specific problems and is eager to regain his health and stamina. pt encouraged to continue staff recommendation for diet and exercise to increase ability to achieve goals.         PHQ-9: Recent Review Flowsheet Data    Depression screen [facility] 2/9 06/14/2017   Decreased Interest 0   Down, Depressed, Hopeless 0   PHQ - 2 Score 0     Interpretation of Total Score  Total Score Depression Severity:  1-4 = Minimal depression, 5-9 = Mild depression, 10-14 = Moderate depression, 15-19 = Moderately severe depression, 20-27 = Severe depression   Psychosocial Evaluation and Intervention:   Psychosocial Re-Evaluation: Psychosocial Re-Evaluation    [place] Name 06/23/17 1031 07/20/17 0816 08/17/17 1605  Psychosocial Re-Evaluation   Current issues with  None Identified  None Identified  None Identified     Comments  no psychoosocial needs identified, no interventions necessary   no psychoosocial needs identified, no interventions necessary   no psychoosocial needs identified, no interventions necessary      Expected Outcomes  pt will exhibit positive outlook with good coping skills.   pt will exhibit positive outlook with good coping skills.   pt will exhibit positive outlook with good coping skills.      Interventions  Relaxation education;Stress management education;Encouraged to attend Cardiac Rehabilitation for the exercise  Relaxation education;Stress management education;Encouraged to attend Cardiac Rehabilitation for the exercise  Relaxation education;Stress management education;Encouraged to attend  Cardiac Rehabilitation for the exercise     Continue Psychosocial Services   No Follow up required  No Follow up required  No Follow up required        Psychosocial Discharge (Final Psychosocial Re-Evaluation): Psychosocial Re-Evaluation - 08/17/17 1605      Psychosocial Re-Evaluation   Current issues with  None Identified    Comments  no psychoosocial needs identified, no interventions necessary     Expected Outcomes  pt will exhibit positive outlook with good coping skills.     Interventions  Relaxation education;Stress management education;Encouraged to attend Cardiac Rehabilitation for the exercise    Continue Psychosocial Services   No Follow up required       Vocational Rehabilitation: Provide vocational rehab assistance to qualifying candidates.   Vocational Rehab Evaluation & Intervention:   Education: Education Goals: Education classes will be provided on a weekly basis, covering required topics. Participant will state understanding/return demonstration of topics presented.  Learning Barriers/Preferences:   Education Topics: Count Your Pulse:  -Group instruction provided by verbal instruction, demonstration, patient participation and written materials to support subject.  Instructors address importance of being able to find your pulse and how to count your pulse when at home without a heart monitor.  Patients get hands on experience counting their pulse with staff help and individually.   Heart Attack, Angina, and Risk Factor Modification:  -Group instruction provided by verbal instruction, video, and written materials to support subject.  Instructors address signs and symptoms of angina and heart attacks.    Also discuss risk factors for heart disease and how to make changes to improve heart health risk factors.   Functional Fitness:  -Group instruction provided by verbal instruction, demonstration, patient participation, and written materials to support subject.   Instructors address safety measures for doing things around the house.  Discuss how to get up and down off the floor, how to pick things up properly, how to safely get out of a chair without assistance, and balance training.   CARDIAC REHAB PHASE II EXERCISE from 08/18/2017 in [place]  Date  07/09/17  Instruction Review Code  2- meets goals/outcomes      Meditation and Mindfulness:  -Group instruction provided by verbal instruction, patient participation, and written materials to support subject.  Instructor addresses importance of mindfulness and meditation practice to help reduce stress and improve awareness.  Instructor also leads participants through a meditation exercise.    CARDIAC REHAB PHASE II EXERCISE from 08/18/2017 in [place]  Date  06/23/17  Instruction Review Code  2- meets goals/outcomes      Stretching for Flexibility and Mobility:  -Group instruction provided by verbal instruction, patient participation, and written materials to support subject.  Instructors lead participants through series of stretches that are designed to increase flexibility thus improving mobility.  These stretches are additional exercise for major muscle groups that are typically performed during regular warm up and cool down.   CARDIAC REHAB PHASE II EXERCISE from 08/18/2017 in [place]  Date  07/14/17  Instruction Review Code  2- meets goals/outcomes      Hands Only CPR:  -Group verbal, video, and participation provides a basic overview of AHA guidelines for community CPR. Role-play of emergencies allow participants the opportunity to practice calling for help and chest compression technique with discussion of AED use.   Hypertension: -Group verbal and written instruction that provides a basic overview of hypertension including the most recent diagnostic guidelines, risk factor reduction with  self-care instructions and medication management.   CARDIAC REHAB PHASE II EXERCISE from 08/18/2017 in [place]  Date  06/18/17  Instruction Review Code  2- meets goals/outcomes       Nutrition I class: Heart Healthy Eating:  -Group instruction provided by PowerPoint slides, verbal discussion, and written materials to support subject matter. The instructor gives an explanation and review of the Therapeutic Lifestyle Changes diet recommendations, which includes a discussion on lipid goals, dietary fat, sodium, fiber, plant stanol/sterol esters, sugar, and the components of a well-balanced, healthy diet.   CARDIAC REHAB PHASE II EXERCISE from 08/18/2017 in [place]  Date  07/27/17  Educator  RD  Instruction Review Code  2- meets goals/outcomes      Nutrition II class: Lifestyle Skills:  -Group instruction provided by PowerPoint slides, verbal discussion, and written materials to support subject matter. The instructor gives an explanation and review of label reading, grocery shopping for heart health, heart healthy recipe modifications, and ways to make healthier choices when eating out.   Diabetes Question & Answer:  -Group instruction provided by PowerPoint slides, verbal discussion, and written materials to support subject matter. The instructor gives an explanation and review of diabetes co-morbidities, pre- and post-prandial blood glucose goals, pre-exercise blood glucose goals, signs, symptoms, and treatment of hypoglycemia and hyperglycemia, and foot care basics.   Diabetes Blitz:  -Group instruction provided by PowerPoint slides, verbal discussion, and written materials to support subject matter. The instructor gives an explanation and review of the physiology behind type 1 and type 2 diabetes, diabetes medications and rational behind using different medications, pre- and post-prandial blood glucose recommendations and  Hemoglobin A1c goals, diabetes diet, and exercise including blood glucose guidelines for exercising safely.    Portion Distortion:  -Group instruction provided by PowerPoint slides, verbal discussion, written materials, and food models to support subject matter. The instructor gives an explanation of serving size versus portion size, changes in portions sizes over the last 20 years, and what consists of a serving from each food group.   Stress Management:  -Group instruction provided by verbal instruction, video, and written materials to support subject matter.  Instructors review role of stress in heart disease and how to cope with stress positively.     CARDIAC REHAB PHASE II EXERCISE from 08/18/2017 in [place]  Date  07/16/17  Instruction Review Code  2- meets goals/outcomes      Exercising on Your Own:  -Group instruction provided by verbal instruction, power point, and written materials to support subject.  Instructors discuss benefits of exercise, components of exercise, frequency and intensity of exercise, and end points  for exercise.  Also discuss use of nitroglycerin and activating EMS.  Review options of places to exercise outside of rehab.  Review guidelines for sex with heart disease.   CARDIAC REHAB PHASE II EXERCISE from 08/18/2017 in [place]  Date  07/28/17  Instruction Review Code  2- meets goals/outcomes      Cardiac Drugs I:  -Group instruction provided by verbal instruction and written materials to support subject.  Instructor reviews cardiac drug classes: antiplatelets, anticoagulants, beta blockers, and statins.  Instructor discusses reasons, side effects, and lifestyle considerations for each drug class.   Cardiac Drugs II:  -Group instruction provided by verbal instruction and written materials to support subject.  Instructor reviews cardiac drug classes: angiotensin converting enzyme inhibitors  (ACE-I), angiotensin II receptor blockers (ARBs), nitrates, and calcium channel blockers.  Instructor discusses reasons, side effects, and lifestyle considerations for each drug class.   CARDIAC REHAB PHASE II EXERCISE from 08/18/2017 in [place]  Date  06/30/17  Instruction Review Code  2- meets goals/outcomes      Anatomy and Physiology of the Circulatory System:  Group verbal and written instruction and models provide basic cardiac anatomy and physiology, with the coronary electrical and arterial systems. Review of: AMI, Angina, Valve disease, Heart Failure, Peripheral Artery Disease, Cardiac Arrhythmia, Pacemakers, and the ICD.   CARDIAC REHAB PHASE II EXERCISE from 08/18/2017 in [place]  Date  08/18/17  Instruction Review Code  2- meets goals/outcomes      Other Education:  -Group or individual verbal, written, or video instructions that support the educational goals of the cardiac rehab program.   CARDIAC REHAB PHASE II EXERCISE from 08/18/2017 in [place]  Date  07/30/17 [DUKGURK Eating]  Educator  RD  Instruction Review Code  2- Demonstrated Understanding      Knowledge Questionnaire Score:   Core Components/Risk Factors/Patient Goals at Admission:   Core Components/Risk Factors/Patient Goals Review:  Goals and Risk Factor Review    Row Name 06/23/17 1029 07/20/17 0816 07/23/17 1137 08/17/17 1603       Core Components/Risk Factors/Patient Goals Review   Personal Goals Review  Weight Management/Obesity;Tobacco Cessation;Hypertension;Lipids;Diabetes  Weight Management/Obesity;Tobacco Cessation;Hypertension;Lipids;Diabetes  Weight Management/Obesity;Tobacco Cessation;Hypertension;Lipids;Diabetes  Weight Management/Obesity;Tobacco Cessation;Hypertension;Lipids;Diabetes    Review  pt with multiple CAD RF demonstrates eagerness to participate in CR activities.  pt continues in  determination phase of tobacco cessation and activity progression.    pt with multiple CAD RF demonstrates eagerness to participate in CR activities.  pt continues in determination phase of tobacco cessation and activity progression.    pt with multiple CAD RF demonstrates eagerness to participate in CR activities.  pt encouraged by his increased flexability and increased stamina. pt continues in determination phase of tobacco cessation and activity progression.  pt able to walk further distance without DOE.  pt also notes improved dietary habits.   pt with multiple CAD RF demonstrates eagerness to participate in CR activities. pt exhibits increased strength/stamina and states he looks forward to coming to exercise sessions.  pt is riding bicycle at home weather permintting.   pt continues in determination phase of tobacco cessation and activity progression.  pt able to walk further distance without DOE.  pt also notes improved dietary habits.     Expected Outcomes  pt will continue CR exercise, nutrition and lifestyle modification to reduce overall RF.   pt will continue CR exercise, nutrition and  lifestyle modification to reduce overall RF.   pt will continue CR exercise, nutrition and lifestyle modification to reduce overall RF.   pt will continue CR exercise, nutrition and lifestyle modification to reduce overall RF.        Core Components/Risk Factors/Patient Goals at Discharge (Final Review):  Goals and Risk Factor Review - 08/17/17 1603      Core Components/Risk Factors/Patient Goals Review   Personal Goals Review  Weight Management/Obesity;Tobacco Cessation;Hypertension;Lipids;Diabetes    Review  pt with multiple CAD RF demonstrates eagerness to participate in CR activities. pt exhibits increased strength/stamina and states he looks forward to coming to exercise sessions.  pt is riding bicycle at home weather permintting.   pt continues in determination phase of tobacco cessation and activity  progression.  pt able to walk further distance without DOE.  pt also notes improved dietary habits.     Expected Outcomes  pt will continue CR exercise, nutrition and lifestyle modification to reduce overall RF.        ITP Comments: ITP Comments    Row Name 06/29/17 1550 07/20/17 0816 08/17/17 1603       ITP Comments  30 day ITP review.  Pt with good participation and attendance.  No change in current regimen unless directed by Medical Director.    30 day ITP review.  Pt with good participation and attendance.  No change in current regimen unless directed by Medical Director.    30 day ITP review.  Pt with good participation and attendance.  No change in current regimen unless directed by Medical Director.          Comments:

## 2017-08-23 ENCOUNTER — Encounter (HOSPITAL_COMMUNITY)
Admission: RE | Admit: 2017-08-23 | Discharge: 2017-08-23 | Disposition: A | Discharge status: Home / Self Care | Payer: Medicare Other | Source: Ambulatory Visit | Attending: Cardiovascular Disease | Admitting: Cardiovascular Disease

## 2017-08-23 DIAGNOSIS — Z951 Presence of aortocoronary bypass graft: Secondary | ICD-10-CM | POA: Insufficient documentation

## 2017-08-23 DIAGNOSIS — Z48812 Encounter for surgical aftercare following surgery on the circulatory system: Secondary | ICD-10-CM | POA: Insufficient documentation

## 2017-08-23 NOTE — Progress Notes (Signed)
Pt in today for cardiac rehab.  Pt with initial pre exercise blood pressure of 170/100 manual and 168/93 auto cuff.  Pt reports having a sore throat and he did not rest well because his cpap mask came off during the night and he was unaware. Attempted to review his medications with pt. Pt does not administer his own medications.  Pt depends upon his wife to give him his medications.  Called and spoke to [facility] and reviewed his medications.  Pt takes 2 heart related medications one in the morning amiodarone and the toprol he takes at 6 pm.  Rechecked hp manually 140/78.  Pt felt ok to exercise. bp checked during exercise and remained in the 140/70 range. Continue to monitor. Cherre Huger, BSN Cardiac and Training and development officer

## 2017-08-25 ENCOUNTER — Encounter (HOSPITAL_COMMUNITY)
Admission: RE | Admit: 2017-08-25 | Discharge: 2017-08-25 | Disposition: A | Discharge status: Home / Self Care | Payer: Medicare Other | Source: Ambulatory Visit | Attending: Cardiovascular Disease | Admitting: Cardiovascular Disease

## 2017-08-25 DIAGNOSIS — Z951 Presence of aortocoronary bypass graft: Secondary | ICD-10-CM | POA: Diagnosis not present

## 2017-08-25 DIAGNOSIS — Z48812 Encounter for surgical aftercare following surgery on the circulatory system: Secondary | ICD-10-CM | POA: Diagnosis not present

## 2017-08-27 ENCOUNTER — Encounter (HOSPITAL_COMMUNITY)
Admission: RE | Admit: 2017-08-27 | Discharge: 2017-08-27 | Disposition: A | Discharge status: Home / Self Care | Payer: Medicare Other | Source: Ambulatory Visit | Attending: Cardiovascular Disease | Admitting: Cardiovascular Disease

## 2017-08-27 DIAGNOSIS — Z951 Presence of aortocoronary bypass graft: Secondary | ICD-10-CM | POA: Diagnosis not present

## 2017-08-27 DIAGNOSIS — Z48812 Encounter for surgical aftercare following surgery on the circulatory system: Secondary | ICD-10-CM | POA: Diagnosis not present

## 2017-08-29 ENCOUNTER — Other Ambulatory Visit: Payer: Self-pay | Admitting: Physician Assistant

## 2017-08-30 ENCOUNTER — Encounter (HOSPITAL_COMMUNITY): Payer: Medicare Other

## 2017-09-01 ENCOUNTER — Encounter (HOSPITAL_COMMUNITY)
Admission: RE | Admit: 2017-09-01 | Discharge: 2017-09-01 | Disposition: A | Discharge status: Home / Self Care | Payer: Medicare Other | Source: Ambulatory Visit | Attending: Cardiovascular Disease | Admitting: Cardiovascular Disease

## 2017-09-01 DIAGNOSIS — Z48812 Encounter for surgical aftercare following surgery on the circulatory system: Secondary | ICD-10-CM | POA: Diagnosis not present

## 2017-09-01 DIAGNOSIS — Z951 Presence of aortocoronary bypass graft: Secondary | ICD-10-CM | POA: Diagnosis not present

## 2017-09-03 ENCOUNTER — Encounter (HOSPITAL_COMMUNITY)
Admission: RE | Admit: 2017-09-03 | Discharge: 2017-09-03 | Disposition: A | Discharge status: Home / Self Care | Payer: Medicare Other | Source: Ambulatory Visit | Attending: Cardiovascular Disease | Admitting: Cardiovascular Disease

## 2017-09-03 DIAGNOSIS — Z951 Presence of aortocoronary bypass graft: Secondary | ICD-10-CM

## 2017-09-03 DIAGNOSIS — Z48812 Encounter for surgical aftercare following surgery on the circulatory system: Secondary | ICD-10-CM | POA: Diagnosis not present

## 2017-09-06 ENCOUNTER — Encounter (HOSPITAL_COMMUNITY)
Admission: RE | Admit: 2017-09-06 | Discharge: 2017-09-06 | Disposition: A | Discharge status: Home / Self Care | Payer: Medicare Other | Source: Ambulatory Visit | Attending: Cardiovascular Disease | Admitting: Cardiovascular Disease

## 2017-09-06 DIAGNOSIS — Z951 Presence of aortocoronary bypass graft: Secondary | ICD-10-CM

## 2017-09-06 DIAGNOSIS — Z48812 Encounter for surgical aftercare following surgery on the circulatory system: Secondary | ICD-10-CM | POA: Diagnosis not present

## 2017-09-07 ENCOUNTER — Encounter (HOSPITAL_COMMUNITY): Payer: Self-pay

## 2017-09-08 ENCOUNTER — Encounter (HOSPITAL_COMMUNITY)
Admission: RE | Admit: 2017-09-08 | Discharge: 2017-09-08 | Disposition: A | Discharge status: Home / Self Care | Payer: Medicare Other | Source: Ambulatory Visit | Attending: Cardiovascular Disease | Admitting: Cardiovascular Disease

## 2017-09-08 DIAGNOSIS — Z951 Presence of aortocoronary bypass graft: Secondary | ICD-10-CM | POA: Diagnosis not present

## 2017-09-08 DIAGNOSIS — Z48812 Encounter for surgical aftercare following surgery on the circulatory system: Secondary | ICD-10-CM | POA: Diagnosis not present

## 2017-09-10 ENCOUNTER — Encounter (HOSPITAL_COMMUNITY)
Admission: RE | Admit: 2017-09-10 | Discharge: 2017-09-10 | Disposition: A | Discharge status: Home / Self Care | Payer: Medicare Other | Source: Ambulatory Visit | Attending: Cardiovascular Disease | Admitting: Cardiovascular Disease

## 2017-09-10 DIAGNOSIS — Z48812 Encounter for surgical aftercare following surgery on the circulatory system: Secondary | ICD-10-CM | POA: Diagnosis not present

## 2017-09-10 DIAGNOSIS — Z951 Presence of aortocoronary bypass graft: Secondary | ICD-10-CM | POA: Diagnosis not present

## 2017-09-15 ENCOUNTER — Encounter (HOSPITAL_COMMUNITY)
Admission: RE | Admit: 2017-09-15 | Discharge: 2017-09-15 | Disposition: A | Discharge status: Home / Self Care | Payer: Medicare Other | Source: Ambulatory Visit | Attending: Cardiovascular Disease | Admitting: Cardiovascular Disease

## 2017-09-15 DIAGNOSIS — Z951 Presence of aortocoronary bypass graft: Secondary | ICD-10-CM | POA: Diagnosis not present

## 2017-09-15 DIAGNOSIS — Z48812 Encounter for surgical aftercare following surgery on the circulatory system: Secondary | ICD-10-CM | POA: Diagnosis not present

## 2017-09-15 LAB — GLUCOSE, CAPILLARY: GLUCOSE-CAPILLARY: 181 mg/dL — AB (ref 65–99)

## 2017-09-17 ENCOUNTER — Encounter (HOSPITAL_COMMUNITY)
Admission: RE | Admit: 2017-09-17 | Discharge: 2017-09-17 | Disposition: A | Discharge status: Home / Self Care | Payer: Medicare Other | Source: Ambulatory Visit | Attending: Cardiovascular Disease | Admitting: Cardiovascular Disease

## 2017-09-17 DIAGNOSIS — Z48812 Encounter for surgical aftercare following surgery on the circulatory system: Secondary | ICD-10-CM | POA: Diagnosis not present

## 2017-09-17 DIAGNOSIS — Z951 Presence of aortocoronary bypass graft: Secondary | ICD-10-CM

## 2017-09-17 LAB — GLUCOSE, CAPILLARY: GLUCOSE-CAPILLARY: 133 mg/dL — AB (ref 65–99)

## 2017-09-17 NOTE — Progress Notes (Signed)
Cardiac Individual Treatment Plan  Patient Details  Name: David Frost MRN: [id number] Date of Birth: 01/03/1951 Referring Provider:     CARDIAC REHAB PHASE II ORIENTATION from 06/10/2017 in [facility]  Referring Provider  Quay Burow MD      Initial Encounter Date:    CARDIAC REHAB PHASE II ORIENTATION from 06/10/2017 in [facility]  Date  06/10/17  Referring Provider  Quay Burow MD      Visit Diagnosis: S/P CABG x 6  Patient's Home Medications on Admission:  Current Outpatient Medications:  .  acetaminophen (TYLENOL) 325 MG tablet, Take 2 tablets (650 mg total) by mouth every 6 (six) hours as needed for mild pain., Disp: , Rfl:  .  ALPRAZolam (XANAX) 0.25 MG tablet, Take 1 tablet (0.25 mg total) by mouth 2 (two) times daily as needed for anxiety., Disp: 14 tablet, Rfl: 0 .  amiodarone (PACERONE) 100 MG tablet, Take 1 tablet (100 mg total) by mouth daily., Disp: 30 tablet, Rfl: 6 .  atorvastatin (LIPITOR) 80 MG tablet, Take 1 tablet (80 mg total) by mouth daily at 6 PM., Disp: 30 tablet, Rfl: 3 .  brimonidine-timolol (COMBIGAN) 0.2-0.5 % ophthalmic solution, Place 1 drop into both eyes 2 (two) times daily. , Disp: , Rfl:  .  Canagliflozin (INVOKANA) 300 MG TABS, Take 300 mg by mouth daily., Disp: , Rfl:  .  metoprolol succinate (TOPROL-XL) 50 MG 24 hr tablet, Take 1 tablet (50 mg total) by mouth daily., Disp: 60 tablet, Rfl: 2 .  metoprolol succinate (TOPROL-XL) 50 MG 24 hr tablet, TAKE 1 TABLET(50 MG) BY MOUTH TWICE DAILY, Disp: 60 tablet, Rfl: 3 .  Multiple Vitamin (MULTIVITAMIN WITH MINERALS) TABS tablet, Take 1 tablet by mouth daily., Disp: , Rfl:  .  nitroGLYCERIN (NITROSTAT) 0.4 MG SL tablet, Place 1 tablet (0.4 mg total) under the tongue every 5 (five) minutes as needed for chest pain., Disp: 25 tablet, Rfl: 3 .  omeprazole (PRILOSEC) 40 MG capsule, Take 1 capsule (40 mg total) by mouth daily., Disp: 30  capsule, Rfl: 11 .  Polyethylene Glycol 3350 (MIRALAX PO), Take 1 Dose by mouth every other day. , Disp: , Rfl:  .  PROAIR RESPICLICK 962 (90 Base) MCG/ACT AEPB, Take 2 puffs by mouth as directed., Disp: , Rfl: 1  Past Medical History: Past Medical History:  Diagnosis Date  . Anxiety   . Coronary artery disease    a. s/p CABG in 03/2017 with LIMA-LAD, reverse SVG-intermediate, sequential reverse SVG-OM1-dLCx, and sequential reverse SVG-PDA-PLB  . Diabetes mellitus without complication ([place])    type 2  . Dietary indiscretion   . Dyspnea   . GERD (gastroesophageal reflux disease)   . Hypertension   . Obesity   . Obstructive sleep apnea   . PAF (paroxysmal atrial fibrillation) ([place])    a. s/p DCCV in 04/2017  . Right bundle branch block     Tobacco Use: Social History   Tobacco Use  Smoking Status Former Smoker  . Years: 2.00  . Types: Cigars  . Last attempt to quit: 03/08/2017  . Years since quitting: 0.5  Smokeless Tobacco Never Used    Labs: Recent Chemical engineer    Labs for ITP Cardiac and Pulmonary Rehab Latest Ref Rng & Units 03/22/2017 03/22/2017 03/22/2017 03/22/2017 03/23/2017   Cholestrol 0 - 200 mg/dL - - - - -   LDLCALC 0 - 99 mg/dL - - - - -  HDL >40 mg/dL - - - - -   Trlycerides <150 mg/dL - - - - -   Hemoglobin A1c 4.8 - 5.6 % - - - - -   PHART 7.350 - 7.450 7.494(H) 7.438 7.414 - -   PCO2ART 32.0 - 48.0 mmHg 38.9 47.2 50.8(H) - -   HCO3 20.0 - 28.0 mmol/L 29.9(H) 31.6(H) 32.5(H) - -   TCO2 0 - 100 mmol/L 31 33 34 29 27   O2SAT % 99.0 98.0 97.0 - -      Capillary Blood Glucose: Lab Results  Component Value Date   GLUCAP 133 (H) 09/17/2017   GLUCAP 181 (H) 09/15/2017   GLUCAP 129 (H) 08/18/2017   GLUCAP 111 (H) 08/16/2017   GLUCAP 129 (H) 07/28/2017     Exercise Target Goals:    Exercise Program Goal: Individual exercise prescription set with THRR, safety & activity barriers. Participant demonstrates ability to understand and report RPE using  BORG scale, to self-measure pulse accurately, and to acknowledge the importance of the exercise prescription.  Exercise Prescription Goal: Starting with aerobic activity 30 plus minutes a day, 3 days per week for initial exercise prescription. Provide home exercise prescription and guidelines that participant acknowledges understanding prior to discharge.  Activity Barriers & Risk Stratification: Activity Barriers & Cardiac Risk Stratification - 06/10/17 1546      Activity Barriers & Cardiac Risk Stratification   Activity Barriers  Other (comment);Shortness of Breath;Muscular Weakness;Deconditioning    Comments  B  knee pain    Cardiac Risk Stratification  High       6 Minute Walk: 6 Minute Walk    Row Name 06/10/17 1427         6 Minute Walk   Phase  Initial     Distance  1254 feet     Walk Time  6 minutes     # of Rest Breaks  0     MPH  2.4     METS  2.2     RPE  13     VO2 Peak  7.7     Symptoms  No     Resting HR  61 bpm     Resting BP  134/76     Resting Oxygen Saturation   96 %     Exercise Oxygen Saturation  during 6 min walk  95 %     Max Ex. HR  89 bpm     Max Ex. BP  154/82     2 Minute Post BP  142/60        Oxygen Initial Assessment:   Oxygen Re-Evaluation:   Oxygen Discharge (Final Oxygen Re-Evaluation):   Initial Exercise Prescription: Initial Exercise Prescription - 06/10/17 1400      Date of Initial Exercise RX and Referring Provider   Date  06/10/17    Referring Provider  Quay Burow MD      Treadmill   MPH  2.3    Grade  0    Minutes  10    METs  2.76      Bike   Level  0.5    Minutes  10    METs  1.7      NuStep   Level  3    SPM  80    Minutes  10    METs  2      Prescription Details   Frequency (times per week)  3    Duration  Progress to 30 minutes  of continuous aerobic without signs/symptoms of physical distress      Intensity   THRR 40-80% of Max Heartrate  61-122    Ratings of Perceived Exertion  11-15     Perceived Dyspnea  0-4      Progression   Progression  Continue progressive overload as per policy without signs/symptoms or physical distress.      Resistance Training   Training Prescription  Yes    Weight  3lbs    Reps  10-15       Perform Capillary Blood Glucose checks as needed.  Exercise Prescription Changes: Exercise Prescription Changes    Row Name 06/14/17 1249 06/29/17 1200 07/12/17 1500 08/02/17 1412 08/16/17 1600     Response to Exercise   Blood Pressure (Admit)  146/70  126/82  136/90  130/88  136/84   Blood Pressure (Exercise)  140/84  136/70  138/80  144/80  144/78   Blood Pressure (Exit)  142/74  136/84  132/70  142/70  122/74   Heart Rate (Admit)  80 bpm  64 bpm  66 bpm  62 bpm  62 bpm   Heart Rate (Exercise)  95 bpm  84 bpm  86 bpm  87 bpm  84 bpm   Heart Rate (Exit)  67 bpm  64 bpm  75 bpm  59 bpm  60 bpm   Rating of Perceived Exertion (Exercise)  12  12  12  12  12    Symptoms  fatigue, deconditioned, mild knee pain  occ. knee pain with long periods of standing and walking  occ. knee pain with long periods of standing and walking  occ. knee pain with long periods of standing and walking  -   Comments  pt was oriented to exercise equiment today  exercise equiment was adjusted for pt comfort  exercise equiment was adjusted for pt comfort  exercise equiment was adjusted for pt comfort  -   Duration  Continue with 30 min of aerobic exercise without signs/symptoms of physical distress.  Continue with 30 min of aerobic exercise without signs/symptoms of physical distress.  Continue with 30 min of aerobic exercise without signs/symptoms of physical distress.  Continue with 30 min of aerobic exercise without signs/symptoms of physical distress.  Continue with 30 min of aerobic exercise without signs/symptoms of physical distress.   Intensity  THRR unchanged  THRR unchanged  THRR unchanged  THRR unchanged  THRR unchanged     Progression   Progression  Continue to progress  workloads to maintain intensity without signs/symptoms of physical distress.  Continue to progress workloads to maintain intensity without signs/symptoms of physical distress.  Continue to progress workloads to maintain intensity without signs/symptoms of physical distress.  Continue to progress workloads to maintain intensity without signs/symptoms of physical distress.  Continue to progress workloads to maintain intensity without signs/symptoms of physical distress.   Average METs  1.9  1.9  2  2  2      Resistance Training   Training Prescription  Yes  Yes  Yes  Yes  Yes   Weight  3lbs  3lbs  3lbs  3lbs  4lbs    Reps  10-15  10-15  10-15  10-15  10-15   Time  10 Minutes  10 Minutes  10 Minutes  10 Minutes  10 Minutes     Bike   Level  0.5  0.5  0.5  0.5  0.5   Minutes  10  10  10   10  10   METs  1.7  1.7  1.7  1.7  1.7     NuStep   Level  3  4  4  4  4    SPM  80  80  85  85  85   Minutes  10  10  10  10  10    METs  2.1  2.1  2.3  2.3  2.4     Arm Ergometer   Level  -  1  2  2  3    Minutes  -  10  10  10  10    METs  -  1.9  1.9  1.9  1.98     Track   Laps  -  5  5  -  -   Minutes  -  10  10  -  -   METs  -  1.84  1.84  -  -     Home Exercise Plan   Plans to continue exercise at  -  Home (comment)  Home (comment)  Home (comment)  Home (comment)   Frequency  -  Add 2 additional days to program exercise sessions.  Add 2 additional days to program exercise sessions.  Add 2 additional days to program exercise sessions.  Add 2 additional days to program exercise sessions.   Initial Home Exercises Provided  -  06/28/17  06/28/17  06/28/17  06/28/17   Row Name 08/27/17 1637 09/06/17 1600           Response to Exercise   Blood Pressure (Admit)  120/84  148/82      Blood Pressure (Exercise)  150/88  164/84      Blood Pressure (Exit)  138/84  132/82      Heart Rate (Admit)  63 bpm  65 bpm      Heart Rate (Exercise)  90 bpm  91 bpm      Heart Rate (Exit)  61 bpm  60 bpm      Rating  of Perceived Exertion (Exercise)  12  12      Duration  Continue with 30 min of aerobic exercise without signs/symptoms of physical distress.  Continue with 30 min of aerobic exercise without signs/symptoms of physical distress.      Intensity  THRR unchanged  THRR unchanged        Progression   Progression  Continue to progress workloads to maintain intensity without signs/symptoms of physical distress.  Continue to progress workloads to maintain intensity without signs/symptoms of physical distress.      Average METs  2.3  2.4        Resistance Training   Training Prescription  Yes  Yes      Weight  5lbs  5lbs      Reps  10-15  10-15      Time  10 Minutes  10 Minutes        Bike   Level  1.3  1.3      Minutes  10  10      METs  2.81  2.81        NuStep   Level  4  4      SPM  80  85      Minutes  10  10      METs  2.1  2.3        Arm Ergometer   Level  3  3      Minutes  10  10      METs  1.98  1.98        Home Exercise Plan   Plans to continue exercise at  Home (comment)  Home (comment)      Frequency  Add 2 additional days to program exercise sessions.  Add 2 additional days to program exercise sessions.      Initial Home Exercises Provided  06/28/17  06/28/17         Exercise Comments: Exercise Comments    Row Name 06/29/17 1247 07/20/17 1406 08/17/17 1403 09/06/17 1113     Exercise Comments  Reviewed METs and goals. Pt is tolerating exercise well. Pt is often times limited with exercise due to knee pain. Otherwise pt is responding well to exercise changes.  Reviewed METs and goals. Pt is tolerating exercise well. Pt is often times limited with exercise due to knee pain. Otherwise pt is responding well to exercise changes.  Reviewed METs and goals. Pt is tolerating exercise well. Pt is often times limited with exercise due to knee pain. Otherwise pt is responding well to exercise changes.  Reviewed METs and goals. Pt is tolerating exercise well. Pt is often times limited  with exercise due to knee pain. Otherwise pt is responding well to exercise changes.       Exercise Goals and Review: Exercise Goals    Row Name 06/10/17 1404 06/10/17 1431           Exercise Goals   Increase Physical Activity  Yes  -      Intervention  Provide advice, education, support and counseling about physical activity/exercise needs.;Develop an individualized exercise prescription for aerobic and resistive training based on initial evaluation findings, risk stratification, comorbidities and participant's personal goals.  -      Expected Outcomes  Achievement of increased cardiorespiratory fitness and enhanced flexibility, muscular endurance and strength shown through measurements of functional capacity and personal statement of participant.  -      Increase Strength and Stamina  Yes  - get back to riding bike      Intervention  Provide advice, education, support and counseling about physical activity/exercise needs.;Develop an individualized exercise prescription for aerobic and resistive training based on initial evaluation findings, risk stratification, comorbidities and participant's personal goals.  -      Expected Outcomes  Achievement of increased cardiorespiratory fitness and enhanced flexibility, muscular endurance and strength shown through measurements of functional capacity and personal statement of participant.  -      Able to understand and use rate of perceived exertion (RPE) scale  Yes  -      Intervention  Provide education and explanation on how to use RPE scale  -      Expected Outcomes  Short Term: Able to use RPE daily in rehab to express subjective intensity level;Long Term:  Able to use RPE to guide intensity level when exercising independently  -      Knowledge and understanding of Target Heart Rate Range (THRR)  Yes  -      Intervention  Provide education and explanation of THRR including how the numbers were predicted and where they are located for reference  -       Expected Outcomes  Short Term: Able to state/look up THRR;Long Term: Able to use THRR to govern intensity when exercising independently;Short Term: Able to use daily as guideline for intensity in rehab  -      Able to check pulse independently  Yes  -      Intervention  Provide education and demonstration on how to check pulse in carotid and radial arteries.;Review the importance of being able to check your own pulse for safety during independent exercise  -      Expected Outcomes  Short Term: Able to explain why pulse checking is important during independent exercise;Long Term: Able to check pulse independently and accurately  -      Understanding of Exercise Prescription  Yes  -      Intervention  Provide education, explanation, and written materials on patient's individual exercise prescription  -      Expected Outcomes  Short Term: Able to explain program exercise prescription;Long Term: Able to explain home exercise prescription to exercise independently  -         Exercise Goals Re-Evaluation : Exercise Goals Re-Evaluation    Row Name 06/29/17 1248 07/20/17 1406 08/17/17 1402 09/06/17 1114       Exercise Goal Re-Evaluation   Exercise Goals Review  Increase Physical Activity;Able to understand and use rate of perceived exertion (RPE) scale;Knowledge and understanding of Target Heart Rate Range (THRR);Understanding of Exercise Prescription;Increase Strength and Stamina;Able to check pulse independently  Increase Physical Activity;Able to understand and use rate of perceived exertion (RPE) scale;Knowledge and understanding of Target Heart Rate Range (THRR);Understanding of Exercise Prescription;Increase Strength and Stamina;Able to check pulse independently  Increase Physical Activity;Able to understand and use rate of perceived exertion (RPE) scale;Knowledge and understanding of Target Heart Rate Range (THRR);Understanding of Exercise Prescription;Increase Strength and Stamina;Able to check  pulse independently  Increase Physical Activity;Able to understand and use rate of perceived exertion (RPE) scale;Knowledge and understanding of Target Heart Rate Range (THRR);Understanding of Exercise Prescription;Increase Strength and Stamina;Able to check pulse independently    Comments  Reviewed home exercise with pt today.  Pt plans to ride road bike for exercise, 2x/week.Discussed how to progress exercise safely. Reviewed THR, pulse, RPE, sign and symptoms, and when to call 911 or MD.  Also discussed weather considerations and indoor options.  Pt voiced understanding.  Pt is riding mountain bike, 5-10 miles in distance, without difficulty., 3x/week. Pt is often limited by knee pain  Pt continues to ride mountain bike for exercise 3x/week for 5 miles. Pt finds the bike to be relaxing and stress free. Pt also enjoys exercising in cardiac rehab in group setting.  Pt is handling WL increases very well. Pt is slow to progress due to limitations of knee pain, however pt continues to exercise and be active.     Expected Outcomes  Pt will be compliant with HEP and improve in cardiorespiratory fitness and endurance.   Pt will be compliant with HEP and improve in cardiorespiratory fitness and endurance.   Pt will be compliant with HEP and improve in cardiorespiratory fitness and endurance.   Pt will be compliant with HEP and improve in cardiorespiratory fitness and endurance.         Discharge Exercise Prescription (Final Exercise Prescription Changes): Exercise Prescription Changes - 09/06/17 1600      Response to Exercise   Blood Pressure (Admit)  148/82    Blood Pressure (Exercise)  164/84    Blood Pressure (Exit)  132/82    Heart Rate (Admit)  65 bpm    Heart Rate (Exercise)  91 bpm    Heart Rate (Exit)  60 bpm    Rating of Perceived Exertion (Exercise)  12    Duration  Continue with 30 min of  aerobic exercise without signs/symptoms of physical distress.    Intensity  THRR unchanged       Progression   Progression  Continue to progress workloads to maintain intensity without signs/symptoms of physical distress.    Average METs  2.4      Resistance Training   Training Prescription  Yes    Weight  5lbs    Reps  10-15    Time  10 Minutes      Bike   Level  1.3    Minutes  10    METs  2.81      NuStep   Level  4    SPM  85    Minutes  10    METs  2.3      Arm Ergometer   Level  3    Minutes  10    METs  1.98      Home Exercise Plan   Plans to continue exercise at  Home (comment)    Frequency  Add 2 additional days to program exercise sessions.    Initial Home Exercises Provided  06/28/17       Nutrition:  Target Goals: Understanding of nutrition guidelines, daily intake of sodium 1500mg , cholesterol 200mg , calories 30% from fat and 7% or less from saturated fats, daily to have 5 or more servings of fruits and vegetables.  Biometrics: Pre Biometrics - 06/10/17 1544      Pre Biometrics   Height  5' 11.75" (1.822 m)    Weight  294 lb 15.6 oz (133.8 kg)    Waist Circumference  53 inches    Hip Circumference  49 inches    Waist to Hip Ratio  1.08 %    BMI (Calculated)  40.31    Triceps Skinfold  25 mm    % Body Fat  40 %    Grip Strength  38 kg    Flexibility  9.5 in    Single Leg Stand  2.55 seconds        Nutrition Therapy Plan and Nutrition Goals: Nutrition Therapy & Goals - 06/10/17 1553      Nutrition Therapy   Diet  Carb Modified, Therapeutic Lifestyle Changes      Personal Nutrition Goals   Nutrition Goal  Wt loss of 1-2 lb/week to a wt loss goal of 6-24 lb while in Cardiac Rehab. Goal wt loss is 30 lb.       Intervention Plan   Intervention  Prescribe, educate and counsel regarding individualized specific dietary modifications aiming towards targeted core components such as weight, hypertension, lipid management, diabetes, heart failure and other comorbidities.    Expected Outcomes  Short Term Goal: Understand basic principles of  dietary content, such as calories, fat, sodium, cholesterol and nutrients.;Long Term Goal: Adherence to prescribed nutrition plan.       Nutrition Discharge: Nutrition Scores: Nutrition Assessments - 06/10/17 1553      MEDFICTS Scores   Pre Score  9       Nutrition Goals Re-Evaluation: Nutrition Goals Re-Evaluation    Row Name 07/05/17 0915             Goals   Current Weight  297 lb 9.9 oz (135 kg)       Nutrition Goal  Wt loss of 1-2 lb/week to a wt loss goal of 6-24 lb while in Cardiac Rehab. Goal wt loss is 30 lb.        Comment  Pt struggling with wt loss.  Pt is eating healthier. Pt feels extra calories coming from beer.  See RD note for details.        Expected Outcome  Wt loss of 1-2 lb/week to a wt loss goal of 6-24 lb while in Cardiac Rehab. Goal wt loss is 30 lb.           Nutrition Goals Re-Evaluation: Nutrition Goals Re-Evaluation    Row Name 07/05/17 0915             Goals   Current Weight  297 lb 9.9 oz (135 kg)       Nutrition Goal  Wt loss of 1-2 lb/week to a wt loss goal of 6-24 lb while in Cardiac Rehab. Goal wt loss is 30 lb.        Comment  Pt struggling with wt loss. Pt is eating healthier. Pt feels extra calories coming from beer.  See RD note for details.        Expected Outcome  Wt loss of 1-2 lb/week to a wt loss goal of 6-24 lb while in Cardiac Rehab. Goal wt loss is 30 lb.           Nutrition Goals Discharge (Final Nutrition Goals Re-Evaluation): Nutrition Goals Re-Evaluation - 07/05/17 0915      Goals   Current Weight  297 lb 9.9 oz (135 kg)    Nutrition Goal  Wt loss of 1-2 lb/week to a wt loss goal of 6-24 lb while in Cardiac Rehab. Goal wt loss is 30 lb.     Comment  Pt struggling with wt loss. Pt is eating healthier. Pt feels extra calories coming from beer.  See RD note for details.     Expected Outcome  Wt loss of 1-2 lb/week to a wt loss goal of 6-24 lb while in Cardiac Rehab. Goal wt loss is 30 lb.        Psychosocial: Target  Goals: Acknowledge presence or absence of significant depression and/or stress, maximize coping skills, provide positive support system. Participant is able to verbalize types and ability to use techniques and skills needed for reducing stress and depression.  Initial Review & Psychosocial Screening: Initial Psych Review & Screening - 06/10/17 1534      Initial Review   Current issues with  None Identified      Family Dynamics   Good Support System?  Yes      Barriers   Psychosocial barriers to participate in program  There are no identifiable barriers or psychosocial needs.      Screening Interventions   Interventions  Encouraged to exercise       Quality of Life Scores: Quality of Life - 06/25/17 1541      Quality of Life Scores   Health/Function Pre  23.47 %    Socioeconomic Pre  28.75 %    Psych/Spiritual Pre  28.07 %    Family Pre  28.8 %    GLOBAL Pre  26.36 % QOL scores reveiwed with pt. pt concerns health related. pt denies specific problems and is eager to regain his health and stamina. pt encouraged to continue staff recommendation for diet and exercise to increase ability to achieve goals.         PHQ-9: Recent Review Flowsheet Data    Depression screen [facility] 2/9 06/14/2017   Decreased Interest 0   Down, Depressed, Hopeless 0   PHQ - 2 Score 0     Interpretation of Total Score  Total Score Depression Severity:  1-4 = Minimal depression, 5-9 = Mild depression, 10-14 = Moderate depression, 15-19 = Moderately severe depression, 20-27 = Severe depression   Psychosocial Evaluation and Intervention: Psychosocial Evaluation - 06/14/17 1651      Psychosocial Evaluation & Interventions   Interventions  Encouraged to exercise with the program and follow exercise prescription;Stress management education;Relaxation education    Comments  upon brief assessment, no psychosocial needs identified, no interventions necessary     Expected Outcomes  pt will exhibit positive  outlook with good coping skills.     Continue Psychosocial Services   No Follow up required       Psychosocial Re-Evaluation: Psychosocial Re-Evaluation    [place] Name 06/23/17 1031 07/20/17 0816 08/17/17 1605 09/07/17 1521 09/15/17 0745     Psychosocial Re-Evaluation   Current issues with  None Identified  None Identified  None Identified  None Identified;History of Depression;Current Depression  None Identified;History of Depression;Current Depression   Comments  no psychoosocial needs identified, no interventions necessary   no psychoosocial needs identified, no interventions necessary   no psychoosocial needs identified, no interventions necessary   pt discussed new onset of depression symptoms related to coping with chronic illness and lifestyle modifications.  pt declined offer to discuss antidepressant medication with PCP. pt offered counseling appt with Jeanella Craze.   counseling appt 09/27/2017 @9 :30 with Jeanella Craze.     Expected Outcomes  pt will exhibit positive outlook with good coping skills.   pt will exhibit positive outlook with good coping skills.   pt will exhibit positive outlook with good coping skills.   pt will exhibit improved outlook with good coping skills.   pt will exhibit improved outlook with good coping skills.    Interventions  Relaxation education;Stress management education;Encouraged to attend Cardiac Rehabilitation for the exercise  Relaxation education;Stress management education;Encouraged to attend Cardiac Rehabilitation for the exercise  Relaxation education;Stress management education;Encouraged to attend Cardiac Rehabilitation for the exercise  Relaxation education;Stress management education;Encouraged to attend Cardiac Rehabilitation for the exercise;Therapist referral  Relaxation education;Stress management education;Encouraged to attend Cardiac Rehabilitation for the exercise;Therapist referral   Continue Psychosocial Services   No Follow up required  No Follow  up required  No Follow up required  No Follow up required  No Follow up required      Psychosocial Discharge (Final Psychosocial Re-Evaluation): Psychosocial Re-Evaluation - 09/15/17 0745      Psychosocial Re-Evaluation   Current issues with  None Identified;History of Depression;Current Depression    Comments  counseling appt 09/27/2017 @9 :30 with Jeanella Craze.      Expected Outcomes  pt will exhibit improved outlook with good coping skills.     Interventions  Relaxation education;Stress management education;Encouraged to attend Cardiac Rehabilitation for the exercise;Therapist referral    Continue Psychosocial Services   No Follow up required       Vocational Rehabilitation: Provide vocational rehab assistance to qualifying candidates.   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 Participant will state understanding/return demonstration of topics presented.  Learning Barriers/Preferences: Learning Barriers/Preferences - 06/10/17 1108      Learning Barriers/Preferences   Learning Barriers  Sight    Learning Preferences  Pictoral;Video;Skilled Demonstration  Education Topics: Count Your Pulse:  -Group instruction provided by verbal instruction, demonstration, patient participation and written materials to support subject.  Instructors address importance of being able to find your pulse and how to count your pulse when at home without a heart monitor.  Patients get hands on experience counting their pulse with staff help and individually.   Heart Attack, Angina, and Risk Factor Modification:  -Group instruction provided by verbal instruction, video,  and written materials to support subject.  Instructors address signs and symptoms of angina and heart attacks.    Also discuss risk factors for heart disease and how to make changes to improve heart health risk factors.   Functional Fitness:  -Group instruction provided by verbal instruction, demonstration, patient participation, and written materials to support subject.  Instructors address safety measures for doing things around the house.  Discuss how to get up and down off the floor, how to pick things up properly, how to safely get out of a chair without assistance, and balance training.   CARDIAC REHAB PHASE II EXERCISE from 09/15/2017 in [place]  Date  07/09/17  Instruction Review Code  2- meets goals/outcomes      Meditation and Mindfulness:  -Group instruction provided by verbal instruction, patient participation, and written materials to support subject.  Instructor addresses importance of mindfulness and meditation practice to help reduce stress and improve awareness.  Instructor also leads participants through a meditation exercise.    CARDIAC REHAB PHASE II EXERCISE from 09/15/2017 in [place]  Date  06/23/17  Instruction Review Code  2- meets goals/outcomes      Stretching for Flexibility and Mobility:  -Group instruction provided by verbal instruction, patient participation, and written materials to support subject.  Instructors lead participants through series of stretches that are designed to increase flexibility thus improving mobility.  These stretches are additional exercise for major muscle groups that are typically performed during regular warm up and cool down.   CARDIAC REHAB PHASE II EXERCISE from 09/15/2017 in [place]  Date  07/14/17  Instruction Review Code  2- meets goals/outcomes      Hands Only CPR:  -Group verbal, video, and participation provides a basic  overview of AHA guidelines for community CPR. Role-play of emergencies allow participants the opportunity to practice calling for help and chest compression technique with discussion of AED use.   Hypertension: -Group verbal and written instruction that provides a basic overview of hypertension including the most recent diagnostic guidelines, risk factor reduction with self-care instructions and medication management.   CARDIAC REHAB PHASE II EXERCISE from 09/15/2017 in [place]  Date  08/27/17  Instruction Review Code  2- meets goals/outcomes       Nutrition I class: Heart Healthy Eating:  -Group instruction provided by PowerPoint slides, verbal discussion, and written materials to support subject matter. The instructor gives an explanation and review of the Therapeutic Lifestyle Changes diet recommendations, which includes a discussion on lipid goals, dietary fat, sodium, fiber, plant stanol/sterol esters, sugar, and the components of a well-balanced, healthy diet.   CARDIAC REHAB PHASE II EXERCISE from 09/15/2017 in [place]  Date  07/27/17  Educator  RD  Instruction Review Code  2- meets goals/outcomes      Nutrition II class: Lifestyle Skills:  -Group instruction provided by PowerPoint slides, verbal discussion, and written materials to support subject matter. The  instructor gives an explanation and review of label reading, grocery shopping for heart health, heart healthy recipe modifications, and ways to make healthier choices when eating out.   Diabetes Question & Answer:  -Group instruction provided by PowerPoint slides, verbal discussion, and written materials to support subject matter. The instructor gives an explanation and review of diabetes co-morbidities, pre- and post-prandial blood glucose goals, pre-exercise blood glucose goals, signs, symptoms, and treatment of hypoglycemia and hyperglycemia, and foot care  basics.   CARDIAC REHAB PHASE II EXERCISE from 09/15/2017 in [place]  Date  09/03/17  Educator  RD  Instruction Review Code  2- meets goals/outcomes      Diabetes Blitz:  -Group instruction provided by PowerPoint slides, verbal discussion, and written materials to support subject matter. The instructor gives an explanation and review of the physiology behind type 1 and type 2 diabetes, diabetes medications and rational behind using different medications, pre- and post-prandial blood glucose recommendations and Hemoglobin A1c goals, diabetes diet, and exercise including blood glucose guidelines for exercising safely.    Portion Distortion:  -Group instruction provided by PowerPoint slides, verbal discussion, written materials, and food models to support subject matter. The instructor gives an explanation of serving size versus portion size, changes in portions sizes over the last 20 years, and what consists of a serving from each food group.   CARDIAC REHAB PHASE II EXERCISE from 09/15/2017 in [place]  Date  09/08/17  Educator  RD  Instruction Review Code  2- meets goals/outcomes      Stress Management:  -Group instruction provided by verbal instruction, video, and written materials to support subject matter.  Instructors review role of stress in heart disease and how to cope with stress positively.     CARDIAC REHAB PHASE II EXERCISE from 09/15/2017 in [place]  Date  09/15/17  Instruction Review Code  2- meets goals/outcomes      Exercising on Your Own:  -Group instruction provided by verbal instruction, power point, and written materials to support subject.  Instructors discuss benefits of exercise, components of exercise, frequency and intensity of exercise, and end points for exercise.  Also discuss use of nitroglycerin and activating EMS.  Review options of places to exercise  outside of rehab.  Review guidelines for sex with heart disease.   CARDIAC REHAB PHASE II EXERCISE from 09/15/2017 in [place]  Date  07/28/17  Instruction Review Code  2- meets goals/outcomes      Cardiac Drugs I:  -Group instruction provided by verbal instruction and written materials to support subject.  Instructor reviews cardiac drug classes: antiplatelets, anticoagulants, beta blockers, and statins.  Instructor discusses reasons, side effects, and lifestyle considerations for each drug class.   Cardiac Drugs II:  -Group instruction provided by verbal instruction and written materials to support subject.  Instructor reviews cardiac drug classes: angiotensin converting enzyme inhibitors (ACE-I), angiotensin II receptor blockers (ARBs), nitrates, and calcium channel blockers.  Instructor discusses reasons, side effects, and lifestyle considerations for each drug class.   CARDIAC REHAB PHASE II EXERCISE from 09/15/2017 in [place]  Date  09/01/17  Instruction Review Code  2- meets goals/outcomes      Anatomy and Physiology of the Circulatory System:  Group verbal and written instruction and models provide basic cardiac anatomy and physiology, with the coronary electrical and arterial systems. Review of: AMI, Angina, Valve disease,  Heart Failure, Peripheral Artery Disease, Cardiac Arrhythmia, Pacemakers, and the ICD.   CARDIAC REHAB PHASE II EXERCISE from 09/15/2017 in [place]  Date  08/18/17  Instruction Review Code  2- meets goals/outcomes      Other Education:  -Group or individual verbal, written, or video instructions that support the educational goals of the cardiac rehab program.   CARDIAC REHAB PHASE II EXERCISE from 09/15/2017 in [place]  Date  07/30/17 [[facility]]  Educator  RD  Instruction Review Code  2- Demonstrated Understanding       Knowledge Questionnaire Score: Knowledge Questionnaire Score - 06/10/17 1426      Knowledge Questionnaire Score   Pre Score  21/24       Core Components/Risk Factors/Patient Goals at Admission: Personal Goals and Risk Factors at Admission - 06/10/17 1547      Core Components/Risk Factors/Patient Goals on Admission   Intervention  Weight Management: Develop a combined nutrition and exercise program designed to reach desired caloric intake, while maintaining appropriate intake of nutrient and fiber, sodium and fats, and appropriate energy expenditure required for the weight goal.;Weight Management: Provide education and appropriate resources to help participant work on and attain dietary goals.;Obesity: Provide education and appropriate resources to help participant work on and attain dietary goals.;Weight Management/Obesity: Establish reasonable short term and long term weight goals.    Admit Weight  294 lb 15.6 oz (133.8 kg)    Goal Weight: Short Term  284 lb (128.8 kg)    Goal Weight: Long Term  264 lb (119.7 kg)    Expected Outcomes  Short Term: Continue to assess and modify interventions until short term weight is achieved;Long Term: Adherence to nutrition and physical activity/exercise program aimed toward attainment of established weight goal;Weight Maintenance: Understanding of the daily nutrition guidelines, which includes 25-35% calories from fat, 7% or less cal from saturated fats, less than 200mg  cholesterol, less than 1.5gm of sodium, & 5 or more servings of fruits and vegetables daily;Weight Loss: Understanding of general recommendations for a balanced deficit meal plan, which promotes 1-2 lb weight loss per week and includes a negative energy balance of [phone] kcal/d;Understanding recommendations for meals to include 15-35% energy as protein, 25-35% energy from fat, 35-60% energy from carbohydrates, less than 200mg  of dietary cholesterol, 20-35 gm of total fiber  daily;Understanding of distribution of calorie intake throughout the day with the consumption of 4-5 meals/snacks    Tobacco Cessation  Yes    Intervention  Assist the participant in steps to quit. Provide individualized education and counseling about committing to Tobacco Cessation, relapse prevention, and pharmacological support that can be provided by physician.;Advice worker, assist with locating and accessing local/national Quit Smoking programs, and support quit date choice.    Expected Outcomes  Short Term: Will demonstrate readiness to quit, by selecting a quit date.;Short Term: Will quit all tobacco product use, adhering to prevention of relapse plan.;Long Term: Complete abstinence from all tobacco products for at least 12 months from quit date.    Diabetes  Yes    Intervention  Provide education about signs/symptoms and action to take for hypo/hyperglycemia.;Provide education about proper nutrition, including hydration, and aerobic/resistive exercise prescription along with prescribed medications to achieve blood glucose in normal ranges: Fasting glucose 65-99 mg/dL    Expected Outcomes  Short Term: Participant verbalizes understanding of the signs/symptoms and immediate care of hyper/hypoglycemia, proper foot care and importance of medication, aerobic/resistive exercise and nutrition plan  for blood glucose control.;Long Term: Attainment of HbA1C < 7%.    Hypertension  Yes    Intervention  Provide education on lifestyle modifcations including regular physical activity/exercise, weight management, moderate sodium restriction and increased consumption of fresh fruit, vegetables, and low fat dairy, alcohol moderation, and smoking cessation.;Monitor prescription use compliance.    Expected Outcomes  Short Term: Continued assessment and intervention until BP is < 140/79mm HG in hypertensive participants. < 130/64mm HG in hypertensive participants with diabetes, heart failure or chronic  kidney disease.;Long Term: Maintenance of blood pressure at goal levels.    Lipids  Yes    Intervention  Provide education and support for participant on nutrition & aerobic/resistive exercise along with prescribed medications to achieve LDL 70mg , HDL >40mg .    Expected Outcomes  Short Term: Participant states understanding of desired cholesterol values and is compliant with medications prescribed. Participant is following exercise prescription and nutrition guidelines.;Long Term: Cholesterol controlled with medications as prescribed, with individualized exercise RX and with personalized nutrition plan. Value goals: LDL < 70mg , HDL > 40 mg.       Core Components/Risk Factors/Patient Goals Review:  Goals and Risk Factor Review    Row Name 06/23/17 1029 07/20/17 0816 07/23/17 1137 08/17/17 1603 09/07/17 1521     Core Components/Risk Factors/Patient Goals Review   Personal Goals Review  Weight Management/Obesity;Tobacco Cessation;Hypertension;Lipids;Diabetes  Weight Management/Obesity;Tobacco Cessation;Hypertension;Lipids;Diabetes  Weight Management/Obesity;Tobacco Cessation;Hypertension;Lipids;Diabetes  Weight Management/Obesity;Tobacco Cessation;Hypertension;Lipids;Diabetes  Weight Management/Obesity;Tobacco Cessation;Hypertension;Lipids;Diabetes   Review  pt with multiple CAD RF demonstrates eagerness to participate in CR activities.  pt continues in determination phase of tobacco cessation and activity progression.    pt with multiple CAD RF demonstrates eagerness to participate in CR activities.  pt continues in determination phase of tobacco cessation and activity progression.    pt with multiple CAD RF demonstrates eagerness to participate in CR activities.  pt encouraged by his increased flexability and increased stamina. pt continues in determination phase of tobacco cessation and activity progression.  pt able to walk further distance without DOE.  pt also notes improved dietary habits.   pt  with multiple CAD RF demonstrates eagerness to participate in CR activities. pt exhibits increased strength/stamina and states he looks forward to coming to exercise sessions.  pt is riding bicycle at home weather permintting.   pt continues in determination phase of tobacco cessation and activity progression.  pt able to walk further distance without DOE.  pt also notes improved dietary habits.   pt with multiple CAD RF demonstrates eagerness to participate in CR activities. pt exhibits increased strength/stamina and states he looks forward to coming to exercise sessions.  pt is riding bicycle at home weather permintting.   pt continues in determination phase of tobacco cessation and activity progression.  pt able to walk further distance without DOE.  pt also notes improved dietary habits.    Expected Outcomes  pt will continue CR exercise, nutrition and lifestyle modification to reduce overall RF.   pt will continue CR exercise, nutrition and lifestyle modification to reduce overall RF.   pt will continue CR exercise, nutrition and lifestyle modification to reduce overall RF.   pt will continue CR exercise, nutrition and lifestyle modification to reduce overall RF.   pt will continue CR exercise, nutrition and lifestyle modification to reduce overall RF.       Core Components/Risk Factors/Patient Goals at Discharge (Final Review):  Goals and Risk Factor Review - 09/07/17 1521      Core Components/Risk Factors/Patient  Goals Review   Personal Goals Review  Weight Management/Obesity;Tobacco Cessation;Hypertension;Lipids;Diabetes    Review  pt with multiple CAD RF demonstrates eagerness to participate in CR activities. pt exhibits increased strength/stamina and states he looks forward to coming to exercise sessions.  pt is riding bicycle at home weather permintting.   pt continues in determination phase of tobacco cessation and activity progression.  pt able to walk further distance without DOE.  pt also  notes improved dietary habits.     Expected Outcomes  pt will continue CR exercise, nutrition and lifestyle modification to reduce overall RF.        ITP Comments: ITP Comments    Row Name 06/10/17 1102 06/29/17 1550 07/20/17 0816 08/17/17 1603 09/07/17 1520   ITP Comments  Dr Fransico Him, Medical Director  30 day ITP review.  Pt with good participation and attendance.  No change in current regimen unless directed by Medical Director.    30 day ITP review.  Pt with good participation and attendance.  No change in current regimen unless directed by Medical Director.    30 day ITP review.  Pt with good participation and attendance.  No change in current regimen unless directed by Medical Director.    30 day ITP review.  Pt with good participation and attendance.  No change in current regimen unless directed by Medical Director.        Comments:

## 2017-09-20 ENCOUNTER — Encounter (HOSPITAL_COMMUNITY)
Admission: RE | Admit: 2017-09-20 | Discharge: 2017-09-20 | Disposition: A | Discharge status: Home / Self Care | Payer: Medicare Other | Source: Ambulatory Visit | Attending: Cardiovascular Disease | Admitting: Cardiovascular Disease

## 2017-09-20 VITALS — Ht 71.75 in | Wt 302.0 lb

## 2017-09-20 DIAGNOSIS — Z48812 Encounter for surgical aftercare following surgery on the circulatory system: Secondary | ICD-10-CM | POA: Diagnosis not present

## 2017-09-20 DIAGNOSIS — Z951 Presence of aortocoronary bypass graft: Secondary | ICD-10-CM

## 2017-09-22 ENCOUNTER — Encounter (HOSPITAL_COMMUNITY)
Admission: RE | Admit: 2017-09-22 | Discharge: 2017-09-22 | Disposition: A | Discharge status: Home / Self Care | Payer: Medicare Other | Source: Ambulatory Visit | Attending: Cardiovascular Disease | Admitting: Cardiovascular Disease

## 2017-09-22 DIAGNOSIS — Z951 Presence of aortocoronary bypass graft: Secondary | ICD-10-CM | POA: Diagnosis not present

## 2017-09-22 DIAGNOSIS — Z48812 Encounter for surgical aftercare following surgery on the circulatory system: Secondary | ICD-10-CM | POA: Insufficient documentation

## 2017-09-23 ENCOUNTER — Ambulatory Visit (INDEPENDENT_AMBULATORY_CARE_PROVIDER_SITE_OTHER): Payer: Medicare Other | Admitting: Physician Assistant

## 2017-09-23 ENCOUNTER — Encounter: Payer: Self-pay | Admitting: Physician Assistant

## 2017-09-23 ENCOUNTER — Other Ambulatory Visit: Payer: Self-pay | Admitting: *Deleted

## 2017-09-23 VITALS — BP 138/72 | HR 62 | Ht 74.0 in | Wt 301.8 lb

## 2017-09-23 DIAGNOSIS — I10 Essential (primary) hypertension: Secondary | ICD-10-CM | POA: Diagnosis not present

## 2017-09-23 DIAGNOSIS — E118 Type 2 diabetes mellitus with unspecified complications: Secondary | ICD-10-CM | POA: Diagnosis not present

## 2017-09-23 DIAGNOSIS — I2510 Atherosclerotic heart disease of native coronary artery without angina pectoris: Secondary | ICD-10-CM

## 2017-09-23 DIAGNOSIS — E785 Hyperlipidemia, unspecified: Secondary | ICD-10-CM

## 2017-09-23 MED ORDER — ATORVASTATIN CALCIUM 40 MG PO TABS: 40.0000 mg | ORAL_TABLET | Freq: Every day | ORAL | 11 refills | Status: DC

## 2017-09-23 MED ORDER — ASPIRIN EC 81 MG PO TBEC: 81.0000 mg | DELAYED_RELEASE_TABLET | Freq: Every day | ORAL | 3 refills | Status: DC

## 2017-09-23 NOTE — Progress Notes (Signed)
Cardiology Office Note   Date:  09/23/2017   ID:  David Frost, DOB [birth year], MRN [id number]  PCP:  [name], MD  Cardiologist: Dr. Gwenlyn Found, 07/23/2017 Rosaria Ferries, PA-C   Chief Complaint  Patient presents with  . 2 month f/u    pt states pain in legs has gone away since stopping atorvastatin---per Dr. Kennon Holter last note, consider starting new statin  pt c/o CP--sharp pain happens 1-2 times per week since CABG; has 2 sessions of cardiac rehab left. States he cannot lose weight, he is eating healthier though.  . medication question    pt wants to know if he needs to be tking ASA 81, fish oil, and multivitamin    History of Present Illness: David Frost is a 67 y.o. male with a history of CABG 03/2017 w/ LIMA-LAD, SVG-RI, SVG-OM1-CFX, SVG-PDA-PL, DM, HTN, obesity, OSA on CPAP, PAF on amiodarone and Xarelto, RBBB  11/02 office visit, patient complaining of leg pain with ambulation, Dopplers were normal, possible statin holiday, co-Q10 200 mg added, atorvastatin changed to pravastatin, consider PCS K9, amiodarone decreased from 200-100 mg a day  David Frost presents for cardiology follow up.  The pain in his legs has gotten better since stopping the atorvastatin. He is still doing cardiac rehab, has plans to go to the gym after that ends.  He feels the new exercise was causing more pain in his legs than the statin.  He is okay with trying it again.  He is limiting his sugars and watching his carbs. He is compliant with his medications. His blood sugars are usually <110 at cardiac rehab.  He never gets chest pain when he exerts himself.   He is working with a nutritionist at cardiac rehab. He is reading labels and getting low-sodium food in the grocery store.  He is having occasional sharp pains in his L chest, they are brief and resolve spontaneously. Dr Servando Snare reassured him on this.   No LE edema, no orthopnea, sleeps w/ CPAP but it comes off during sleep.      Past Medical History:  Diagnosis Date  . Anxiety   . Coronary artery disease    a. s/p CABG in 03/2017 with LIMA-LAD, reverse SVG-intermediate, sequential reverse SVG-OM1-dLCx, and sequential reverse SVG-PDA-PLB  . Diabetes mellitus without complication ([place])    type 2  . Dietary indiscretion   . Dyspnea   . GERD (gastroesophageal reflux disease)   . Hypertension   . Obesity   . Obstructive sleep apnea   . PAF (paroxysmal atrial fibrillation) ([place])    a. s/p DCCV in 04/2017  . Right bundle branch block     Past Surgical History:  Procedure Laterality Date  . CARDIAC CATHETERIZATION    . CARDIOVERSION N/A 05/04/2017   Procedure: CARDIOVERSION;  Surgeon: Skeet Latch, MD;  Location: [facility];  Service: Cardiovascular;  Laterality: N/A;  . CORONARY ARTERY BYPASS GRAFT N/A 03/22/2017   Procedure: CORONARY ARTERY BYPASS GRAFTING (CABG) x6, using the left internal mammary artery and the greater saphenous vein harvested endoscopically from the right thigh and calf and the left thigh. -LIMA to LAD -SVG to RAMUS INTERMEDIATE -SEQ SVG to OM1 and DISTAL LEFT CIRCUMFLEX -SEQ SVG to PDA and PLVB;  Surgeon: [name], MD;  Location: [facility];  Service: Open Heart Surgery;  Laterality  . DOPPLER ECHOCARDIOGRAPHY  2010  . NM MYOVIEW LTD  2007  . TEE WITHOUT CARDIOVERSION N/A 03/22/2017   Procedure: TRANSESOPHAGEAL ECHOCARDIOGRAM (  TEE);  Surgeon: [name], MD;  Location: [facility];  Service: Open Heart Surgery;  Laterality: N/A;  . TONSILLECTOMY      Current Outpatient Medications  Medication Sig Dispense Refill  . acetaminophen (TYLENOL) 325 MG tablet Take 2 tablets (650 mg total) by mouth every 6 (six) hours as needed for mild pain.    Marland Kitchen ALPRAZolam (XANAX) 0.25 MG tablet Take 1 tablet (0.25 mg total) by mouth 2 (two) times daily as needed for anxiety. 14 tablet 0  . amiodarone (PACERONE) 100 MG tablet Take 1 tablet (100 mg total) by mouth daily. 30 tablet 6  .  atorvastatin (LIPITOR) 80 MG tablet Take 1 tablet (80 mg total) by mouth daily at 6 PM. 30 tablet 3  . brimonidine-timolol (COMBIGAN) 0.2-0.5 % ophthalmic solution Place 1 drop into both eyes 2 (two) times daily.     . Canagliflozin (INVOKANA) 300 MG TABS Take 300 mg by mouth daily.    . metoprolol succinate (TOPROL-XL) 50 MG 24 hr tablet Take 1 tablet (50 mg total) by mouth daily. 60 tablet 2  . Multiple Vitamin (MULTIVITAMIN WITH MINERALS) TABS tablet Take 1 tablet by mouth daily.    Marland Kitchen omeprazole (PRILOSEC) 40 MG capsule Take 1 capsule (40 mg total) by mouth daily. 30 capsule 11  . Polyethylene Glycol 3350 (MIRALAX PO) Take 1 Dose by mouth every other day.     Marland Kitchen PROAIR RESPICLICK 245 (90 Base) MCG/ACT AEPB Take 2 puffs by mouth as directed.  1  . nitroGLYCERIN (NITROSTAT) 0.4 MG SL tablet Place 1 tablet (0.4 mg total) under the tongue every 5 (five) minutes as needed for chest pain. 25 tablet 3   No current facility-administered medications for this visit.     Allergies:   Patient has no known allergies.    Social History:  The patient  reports that he quit smoking about 6 months ago. His smoking use included cigars. He quit after 2.00 years of use. he has never used smokeless tobacco. He reports that he does not drink alcohol or use drugs.   Family History:  The patient's family history includes Heart attack in his father.    ROS:  Please see the history of present illness. All other systems are reviewed and negative.    PHYSICAL EXAM: VS:  BP 138/72 (BP Location: Left Arm, Patient Position: Sitting, Cuff Size: Large)   Pulse 62   Ht 6\' 2"  (1.88 m)   Wt (!) 301 lb 12.8 oz (136.9 kg)   BMI 38.75 kg/m  , BMI Body mass index is 38.75 kg/m. GEN: Well nourished, well developed, male in no acute distress  HEENT: normal for age  Neck: Minimal JVD, no carotid bruit, no masses Cardiac: RRR; soft murmur, no rubs, or gallops Respiratory: Decreased breath sounds bases bilaterally, normal  work of breathing GI: soft, nontender, nondistended, + BS MS: no deformity or atrophy; minimal edema; distal pulses are 2+ in upper extremities, decreased in both lower extremities Skin: warm and dry, no rash; chronic skin changes on both lower extremities Neuro:  Strength and sensation are intact Psych: euthymic mood, full affect   EKG:  EKG is not ordered today.   Recent Labs: 03/23/2017: Magnesium 2.2 03/24/2017: ALT 64 04/21/2017: Hemoglobin 10.7; Platelets 352; TSH 3.390 05/11/2017: BUN 25; Creatinine, Ser 1.24; Potassium 4.5; Sodium 136    Lipid Panel    Component Value Date/Time   CHOL 190 03/18/2017 0503   TRIG 331 (H) 03/18/2017 0503   HDL  29 (L) 03/18/2017 0503   CHOLHDL 6.6 03/18/2017 0503   VLDL 66 (H) 03/18/2017 0503   LDLCALC 95 03/18/2017 0503     Wt Readings from Last 3 Encounters:  09/23/17 (!) 301 lb 12.8 oz (136.9 kg)  09/20/17 (!) 302 lb 0.5 oz (137 kg)  07/23/17 292 lb 12.8 oz (132.8 kg)     Other studies Reviewed: Additional studies/ records that were reviewed today include: Office notes, hospital records and testing.  ASSESSMENT AND PLAN:  1.  CAD: He is continuing to recover after his bypass surgery.  He is doing well at cardiac rehab.  I will restart aspirin at 81 mg daily.  Continue exercise program  2.  Diabetes with morbid obesity: He is working with a nutritionist and has changed his eating habits.  He is encouraged to continue to work on this.  3.  Hyperlipidemia with a goal LDL less than 70: His leg pain went away after he stopped the atorvastatin, but he feels that he was just not used to exercising.  He is willing to retry it and we will restart the Lipitor at 40 mg daily.  This was not on his AVS, the patient was called and told.  4.  Hypertension: Patient states he has a blood pressure cuff at home and his blood pressure is generally well controlled.  It is up a little bit today, but there is some family stress going on.  He is encouraged to  continue to keep an eye on it and let us know.   Current medicines are reviewed at length with the patient today.  The patient does not have concerns regarding medicines.  The following changes have been made: Restart Lipitor at 40  Labs/ tests ordered today include:  No orders of the defined types were placed in this encounter.    Disposition:   FU with Dr. Gwenlyn Found  Signed, Rosaria Ferries, PA-C  09/23/2017 11:28 AM    [facility] Phone: [phone]; Fax: [phone]  This note was written with the assistance of speech recognition software. Please excuse any transcriptional errors.

## 2017-09-23 NOTE — Progress Notes (Signed)
Per Rosaria Ferries, PA, the patient should restart the Atorvastatin at 40 mg tablet daily. The patient has been made aware and verbalized his understanding. The medication has been sent in electronically.

## 2017-09-23 NOTE — Patient Instructions (Signed)
Medication Instructions: RESTART Aspirin 81 mg tablet daily.  If you need a refill on your cardiac medications before your next appointment, please call your pharmacy.    Follow-Up: Your physician wants you to follow-up in: 6 months with Dr. Gwenlyn Found. You will receive a reminder letter in the mail two months in advance. If you don't receive a letter, please call our office at [phone] to schedule this follow-up appointment.   Special Instructions:  * Continue to monitor your blood pressure at home. * Continue to exercise * Continue to use the CPAP  Thank you for choosing [facility] at [facility]!!

## 2017-09-24 ENCOUNTER — Encounter (HOSPITAL_COMMUNITY)
Admission: RE | Admit: 2017-09-24 | Discharge: 2017-09-24 | Disposition: A | Discharge status: Home / Self Care | Payer: Medicare Other | Source: Ambulatory Visit | Attending: Cardiovascular Disease | Admitting: Cardiovascular Disease

## 2017-09-24 DIAGNOSIS — Z951 Presence of aortocoronary bypass graft: Secondary | ICD-10-CM

## 2017-09-24 DIAGNOSIS — Z48812 Encounter for surgical aftercare following surgery on the circulatory system: Secondary | ICD-10-CM | POA: Diagnosis not present

## 2017-09-27 ENCOUNTER — Encounter (HOSPITAL_COMMUNITY)
Admission: RE | Admit: 2017-09-27 | Discharge: 2017-09-27 | Disposition: A | Discharge status: Home / Self Care | Payer: Medicare Other | Source: Ambulatory Visit | Attending: Cardiovascular Disease | Admitting: Cardiovascular Disease

## 2017-09-27 ENCOUNTER — Encounter (HOSPITAL_COMMUNITY): Payer: Self-pay

## 2017-09-27 DIAGNOSIS — Z951 Presence of aortocoronary bypass graft: Secondary | ICD-10-CM

## 2017-09-27 DIAGNOSIS — Z48812 Encounter for surgical aftercare following surgery on the circulatory system: Secondary | ICD-10-CM | POA: Diagnosis not present

## 2017-10-04 DIAGNOSIS — E119 Type 2 diabetes mellitus without complications: Secondary | ICD-10-CM | POA: Diagnosis not present

## 2017-10-04 DIAGNOSIS — H40053 Ocular hypertension, bilateral: Secondary | ICD-10-CM | POA: Diagnosis not present

## 2017-10-04 DIAGNOSIS — H35371 Puckering of macula, right eye: Secondary | ICD-10-CM | POA: Diagnosis not present

## 2017-10-04 DIAGNOSIS — H04123 Dry eye syndrome of bilateral lacrimal glands: Secondary | ICD-10-CM | POA: Diagnosis not present

## 2017-10-04 DIAGNOSIS — H2513 Age-related nuclear cataract, bilateral: Secondary | ICD-10-CM | POA: Diagnosis not present

## 2017-10-14 ENCOUNTER — Other Ambulatory Visit: Payer: Self-pay

## 2017-10-14 ENCOUNTER — Telehealth: Payer: Self-pay

## 2017-10-14 DIAGNOSIS — R222 Localized swelling, mass and lump, trunk: Secondary | ICD-10-CM

## 2017-10-14 NOTE — Telephone Encounter (Signed)
David Frost called this morning concerned of slight superficial pain to touch, swollen, and small knot at his left breast.  He stated that he had had some pain in the same area previously and he was told by CT surgery and cardiology that this pain was a normal part of the healing process.  He expressed that he just finished cardiac rehabilitation and is working out more with weights.  He feels he may have over done it.  I told him to lighten up on the weights for a couple days and see if his symptoms improve.  If not, he was more than welcome to give [place] a call back.  David Frost acknowledged receipt.

## 2017-10-18 ENCOUNTER — Ambulatory Visit (INDEPENDENT_AMBULATORY_CARE_PROVIDER_SITE_OTHER): Payer: Medicare Other | Admitting: Physician Assistant

## 2017-10-18 ENCOUNTER — Other Ambulatory Visit: Payer: Self-pay

## 2017-10-18 ENCOUNTER — Ambulatory Visit
Admission: RE | Admit: 2017-10-18 | Discharge: 2017-10-18 | Disposition: A | Discharge status: Home / Self Care | Payer: Medicare Other | Source: Ambulatory Visit | Attending: Cardiothoracic Surgery | Admitting: Cardiothoracic Surgery

## 2017-10-18 VITALS — BP 152/85 | HR 61 | Resp 18 | Ht 74.0 in | Wt 304.0 lb

## 2017-10-18 DIAGNOSIS — N644 Mastodynia: Secondary | ICD-10-CM

## 2017-10-18 DIAGNOSIS — I2510 Atherosclerotic heart disease of native coronary artery without angina pectoris: Secondary | ICD-10-CM | POA: Diagnosis not present

## 2017-10-18 DIAGNOSIS — R222 Localized swelling, mass and lump, trunk: Secondary | ICD-10-CM

## 2017-10-18 DIAGNOSIS — R0789 Other chest pain: Secondary | ICD-10-CM | POA: Diagnosis not present

## 2017-10-18 NOTE — Progress Notes (Signed)
HPI:  [name] is S/P CABG performed in July of 2018.  He presents today with new onset left side breast discomfort.  He states this has been occurring for the past several days.  He states that he has completed cardiac rehab and has been going to the gym.  He has been pulling overhead weigths of about 80 lbs that he started a little before the pain started.  He is concerned this is a tumor and presented for evaluation.  He does also question why he is not losing weight.  He states his sugars are controlled.  He is not eating poorly but he continues to gain weight since hospitalization in July.   Current Outpatient Medications  Medication Sig Dispense Refill  . acetaminophen (TYLENOL) 325 MG tablet Take 2 tablets (650 mg total) by mouth every 6 (six) hours as needed for mild pain.    Marland Kitchen ALPRAZolam (XANAX) 0.25 MG tablet Take 1 tablet (0.25 mg total) by mouth 2 (two) times daily as needed for anxiety. 14 tablet 0  . amiodarone (PACERONE) 100 MG tablet Take 1 tablet (100 mg total) by mouth daily. 30 tablet 6  . aspirin EC 81 MG tablet Take 1 tablet (81 mg total) by mouth daily. 90 tablet 3  . atorvastatin (LIPITOR) 40 MG tablet Take 1 tablet (40 mg total) by mouth daily at 6 PM. 30 tablet 11  . brimonidine-timolol (COMBIGAN) 0.2-0.5 % ophthalmic solution Place 1 drop into both eyes 2 (two) times daily.     . Canagliflozin (INVOKANA) 300 MG TABS Take 300 mg by mouth daily.    . metoprolol succinate (TOPROL-XL) 50 MG 24 hr tablet Take 1 tablet (50 mg total) by mouth daily. 60 tablet 2  . Multiple Vitamin (MULTIVITAMIN WITH MINERALS) TABS tablet Take 1 tablet by mouth daily.    Marland Kitchen omeprazole (PRILOSEC) 40 MG capsule Take 1 capsule (40 mg total) by mouth daily. 30 capsule 11  . Polyethylene Glycol 3350 (MIRALAX PO) Take 1 Dose by mouth every other day.     Marland Kitchen PROAIR RESPICLICK 595 (90 Base) MCG/ACT AEPB Take 2 puffs by mouth as directed.  1  . nitroGLYCERIN (NITROSTAT) 0.4 MG SL tablet Place 1 tablet (0.4  mg total) under the tongue every 5 (five) minutes as needed for chest pain. 25 tablet 3   No current facility-administered medications for this visit.     Physical Exam:  BP (!) 152/85 (BP Location: Left Arm, Patient Position: Sitting, Cuff Size: Large)   Pulse 61   Resp 18   Ht 6\' 2"  (1.88 m)   Wt (!) 304 lb (137.9 kg)   SpO2 98% Comment: RA  BMI 39.03 kg/m   Gen: no apparent distress Heart: RRR Lungs: CTA bilaterally Incision: well healed Left Breast: there is a hard area underneath his Left nipple area.  There is no nipple discharge present  Diagnostic Tests:  CXR: sternal wires intact, no pleural effusion  A/P:  1. Left Muscle Strain in Breast area vs. Tumor- instructed patient to stop performing strenuous weight lifting as he could have very well strained a muscle.  He was instructed he can do very light weights but not over his head... If his symptoms do not resolve, will send for [place] vs. Mammography to see if tumor is present 2. Weight gain-- encouraged patient to continue working out as he can.  He admittedly drinks 12 pack of beer nightly which I explained will cause him to gain weight despite exercise and  good diet.  He was instructed to decrease daily intake until he is able to stop, however if he stops immediately he will likely withdrawal 3. RTC prn  Ellwood Handler, PA-C [facility] [phone]

## 2017-10-18 NOTE — Patient Instructions (Signed)
Apply ice to affected area several times per day in 20 minute increments

## 2017-11-25 ENCOUNTER — Other Ambulatory Visit: Payer: Self-pay

## 2017-11-25 ENCOUNTER — Ambulatory Visit (INDEPENDENT_AMBULATORY_CARE_PROVIDER_SITE_OTHER): Payer: Medicare Other | Admitting: Cardiothoracic Surgery

## 2017-11-25 ENCOUNTER — Encounter: Payer: Self-pay | Admitting: Cardiothoracic Surgery

## 2017-11-25 VITALS — BP 172/88 | HR 67 | Resp 18 | Ht 74.0 in | Wt 307.6 lb

## 2017-11-25 DIAGNOSIS — I2510 Atherosclerotic heart disease of native coronary artery without angina pectoris: Secondary | ICD-10-CM

## 2017-11-25 DIAGNOSIS — Z951 Presence of aortocoronary bypass graft: Secondary | ICD-10-CM

## 2017-11-25 DIAGNOSIS — N644 Mastodynia: Secondary | ICD-10-CM

## 2017-11-25 NOTE — Progress Notes (Signed)
[address]       ,[place] [address]             [phone]      David Frost Uncertain Medical Record #[id number] Date of Birth: May 03, 1951  Referring: Pixie Casino, MD Primary Care: [name], MD  Chief Complaint:   POST OP FOLLOW UP 03/22/2017  OPERATIVE REPORT PREOPERATIVE DIAGNOSIS:  Recent non-ST-elevation myocardial infarction with 3-vessel coronary artery disease. POSTOPERATIVE DIAGNOSIS:  Recent non-ST-elevation myocardial infarction with 3-vessel coronary artery disease. SURGICAL PROCEDURE:  Coronary artery bypass grafting x6 with the left internal mammary to the left anterior descending coronary artery, reverse saphenous vein graft to the intermediate coronary, sequential reverse saphenous vein graft to the first obtuse marginal and distal circumflex, sequential reverse saphenous vein graft to the posterior descending and posterolateral branches of the right coronary artery with endoscopic vein harvesting of the right greater saphenous vein, thigh and calf, and left greater saphenous vein, thigh. SURGEON:  Lanelle Bal, MD  History of Present Illness:     Following surgery the patient was readmitted for atrial fibrillation, at that time he was started on anticoagulation and amiodarone. He now comes to the office in sinus rhythm .  Patient was seen in January because of concern of new left chest wall pain after doing heavy lifting in the gym.  He notes that avoiding heavy lifting discomfort is completely resolved.  Unfortunately continues to drink beer on a daily basis and has made no progress in losing weight, and recognizes that fact   Past Medical History:  Diagnosis Date  . Anxiety   . Coronary artery disease    a. s/p CABG in 03/2017 with LIMA-LAD, reverse SVG-intermediate, sequential reverse SVG-OM1-dLCx, and sequential reverse SVG-PDA-PLB  . Diabetes mellitus without complication ([place])    type 2  . Dietary  indiscretion   . Dyspnea   . GERD (gastroesophageal reflux disease)   . Hypertension   . Obesity   . Obstructive sleep apnea   . PAF (paroxysmal atrial fibrillation) ([place])    a. s/p DCCV in 04/2017  . Right bundle branch block      Social History   Tobacco Use  Smoking Status Former Smoker  . Years: 2.00  . Types: Cigars  . Last attempt to quit: 03/08/2017  . Years since quitting: 0.7  Smokeless Tobacco Never Used    Social History   Substance and Sexual Activity  Alcohol Use No  . Alcohol/week: 42.0 oz  . Types: 70 Cans of beer per week   Comment: pt states not drinking since CABG     No Known Allergies  Current Outpatient Medications  Medication Sig Dispense Refill  . acetaminophen (TYLENOL) 325 MG tablet Take 2 tablets (650 mg total) by mouth every 6 (six) hours as needed for mild pain.    Marland Kitchen ALPRAZolam (XANAX) 0.25 MG tablet Take 1 tablet (0.25 mg total) by mouth 2 (two) times daily as needed for anxiety. 14 tablet 0  . amiodarone (PACERONE) 100 MG tablet Take 1 tablet (100 mg total) by mouth daily. 30 tablet 6  . aspirin EC 81 MG tablet Take 1 tablet (81 mg total) by mouth daily. 90 tablet 3  . atorvastatin (LIPITOR) 40 MG tablet Take 1 tablet (40 mg total) by mouth daily at 6 PM. 30 tablet 11  . brimonidine-timolol (COMBIGAN) 0.2-0.5 % ophthalmic solution Place 1 drop into both eyes 2 (two) times daily.     Marland Kitchen  Canagliflozin (INVOKANA) 300 MG TABS Take 300 mg by mouth daily.    . metoprolol succinate (TOPROL-XL) 50 MG 24 hr tablet Take 1 tablet (50 mg total) by mouth daily. 60 tablet 2  . omeprazole (PRILOSEC) 40 MG capsule Take 1 capsule (40 mg total) by mouth daily. 30 capsule 11  . Polyethylene Glycol 3350 (MIRALAX PO) Take 1 Dose by mouth every other day.     Marland Kitchen PROAIR RESPICLICK 426 (90 Base) MCG/ACT AEPB Take 2 puffs by mouth as directed.  1  . nitroGLYCERIN (NITROSTAT) 0.4 MG SL tablet Place 1 tablet (0.4 mg total) under the tongue every 5 (five) minutes as  needed for chest pain. 25 tablet 3   No current facility-administered medications for this visit.        Physical Exam: Pulse 67   Resp 18   Ht 6\' 2"  (1.88 m)   Wt (!) 307 lb 9.6 oz (139.5 kg)   SpO2 96% Comment: RA  BMI 39.49 kg/m  General appearance: alert, cooperative, appears older than stated age and morbidly obese Head: Normocephalic, without obvious abnormality, atraumatic Neck: no adenopathy, no carotid bruit, no JVD, supple, symmetrical, trachea midline and thyroid not enlarged, symmetric, no tenderness/mass/nodules Lymph nodes: Cervical, supraclavicular, and axillary nodes normal. Resp: clear to auscultation bilaterally Back: symmetric, no curvature. ROM normal. No CVA tenderness. Cardio: regular rate and rhythm, S1, S2 normal, no murmur, click, rub or gallop GI: soft, non-tender; bowel sounds normal; no masses,  no organomegaly Extremities: extremities normal, atraumatic, no cyanosis or edema and Homans sign is negative, no sign of DVT Neurologic: Grossly normal  Diagnostic Studies & Laboratory data:    Recent Lab Findings: Lab Results  Component Value Date   WBC 7.3 04/21/2017   HGB 10.7 (L) 04/21/2017   HCT 32.6 (L) 04/21/2017   PLT 352 04/21/2017   GLUCOSE 99 05/11/2017   CHOL 190 03/18/2017   TRIG 331 (H) 03/18/2017   HDL 29 (L) 03/18/2017   LDLCALC 95 03/18/2017   ALT 64 (H) 03/24/2017   AST 40 03/24/2017   NA 136 05/11/2017   K 4.5 05/11/2017   CL 90 (L) 05/11/2017   CREATININE 1.24 05/11/2017   BUN 25 05/11/2017   CO2 27 05/11/2017   TSH 3.390 04/21/2017   INR 1.4 (H) 04/21/2017   HGBA1C 6.0 (H) 03/18/2017      Assessment / Plan:   Patient's left chest wall discomfort has completely disappeared and was likely related to heavy lifting. I have again discussed with him the importance of good diabetes control, weight control, and avoiding alcohol intake which is contributing both to inability to lose weight and making diabetes more difficult to  control. Plan to see him back as needed   [name] MD      [address] [place], [address] Office [phone]   [facility] [phone]  11/25/2017 10:10 AM

## 2017-11-30 NOTE — Progress Notes (Addendum)
Discharge Progress Report  Patient Details  Name: David Frost MRN: [id number] Date of Birth: June 24, 1951 Referring Provider:     CARDIAC REHAB PHASE II ORIENTATION from 06/10/2017 in [place]  Referring Provider  Quay Burow MD       Number of Visits: 36  Reason for Discharge:  Pt stable with exercise.    Smoking History:  Social History   Tobacco Use  Smoking Status Former Smoker  . Years: 2.00  . Types: Cigars  . Last attempt to quit: 03/08/2017  . Years since quitting: 0.7  Smokeless Tobacco Never Used    Diagnosis:  S/P CABG x 6  ADL [facility]SD:   Initial Exercise Prescription: Initial Exercise Prescription - 06/10/17 1400      Date of Initial Exercise RX and Referring Provider   Date  06/10/17    Referring Provider  Quay Burow MD      Treadmill   MPH  2.3    Grade  0    Minutes  10    METs  2.76      Bike   Level  0.5    Minutes  10    METs  1.7      NuStep   Level  3    SPM  80    Minutes  10    METs  2      Prescription Details   Frequency (times per week)  3    Duration  Progress to 30 minutes of continuous aerobic without signs/symptoms of physical distress      Intensity   THRR 40-80% of Max Heartrate  61-122    Ratings of Perceived Exertion  11-15    Perceived Dyspnea  0-4      Progression   Progression  Continue progressive overload as per policy without signs/symptoms or physical distress.      Resistance Training   Training Prescription  Yes    Weight  3lbs    Reps  10-15       Discharge Exercise Prescription (Final Exercise Prescription Changes): Exercise Prescription Changes - 09/27/17 1600      Response to Exercise   Blood Pressure (Admit)  144/80    Blood Pressure (Exercise)  148/80    Blood Pressure (Exit)  142/82    Heart Rate (Admit)  79 bpm    Heart Rate (Exercise)  99 bpm    Heart Rate (Exit)  79 bpm    Rating of Perceived Exertion (Exercise)  11    Duration  Continue  with 30 min of aerobic exercise without signs/symptoms of physical distress.    Intensity  THRR unchanged      Progression   Progression  Continue to progress workloads to maintain intensity without signs/symptoms of physical distress.    Average METs  2.5      Resistance Training   Training Prescription  Yes    Weight  8lbs    Reps  10-15    Time  10 Minutes      Bike   Level  1.3    Minutes  10    METs  2.81      NuStep   Level  4    SPM  100    Minutes  10    METs  2.7      Arm Ergometer   Level  3    Minutes  10    METs  1.98  Home Exercise Plan   Plans to continue exercise at  Home (comment)    Frequency  Add 2 additional days to program exercise sessions.    Initial Home Exercises Provided  06/28/17       Functional Capacity: 6 Minute Walk    Row Name 06/10/17 1427 09/20/17 1613       6 Minute Walk   Phase  Initial  Discharge    Distance  1254 feet  1410 feet    Distance Feet Change  -  156 ft    Walk Time  6 minutes  6 minutes    # of Rest Breaks  0  0    MPH  2.4  2.67    METS  2.2  2.55    RPE  13  13    VO2 Peak  7.7  8.9    Symptoms  No  Yes (comment)    Comments  -  2 out of 10 L knee pain    Resting HR  61 bpm  77 bpm    Resting BP  134/76  126/72    Resting Oxygen Saturation   96 %  -    Exercise Oxygen Saturation  during 6 min walk  95 %  -    Max Ex. HR  89 bpm  99 bpm    Max Ex. BP  154/82  158/92    2 Minute Post BP  142/60  120/68       Psychological, QOL, Others - Outcomes: PHQ 2/9: Depression screen [facility] 2/9 09/27/2017 06/14/2017  Decreased Interest 0 0  Down, Depressed, Hopeless 0 0  PHQ - 2 Score 0 0    Quality of Life: Quality of Life - 09/22/17 0814      Quality of Life Scores   Health/Function Pre  23.47 %    Health/Function Post  27.6 %    Health/Function % Change  17.6 %    Socioeconomic Pre  28.75 %    Socioeconomic Post  29.38 %    Socioeconomic % Change   2.19 %    Psych/Spiritual Pre  28.07 %     Psych/Spiritual Post  26.64 %    Psych/Spiritual % Change  -5.09 %    Family Pre  28.8 %    Family Post  30 %    Family % Change  4.17 %    GLOBAL Pre  26.36 %    GLOBAL Post  28.16 %    GLOBAL % Change  6.83 %       Personal Goals: Goals established at orientation with interventions provided to work toward goal. Personal Goals and Risk Factors at Admission - 06/10/17 1547      Core Components/Risk Factors/Patient Goals on Admission   Intervention  Weight Management: Develop a combined nutrition and exercise program designed to reach desired caloric intake, while maintaining appropriate intake of nutrient and fiber, sodium and fats, and appropriate energy expenditure required for the weight goal.;Weight Management: Provide education and appropriate resources to help participant work on and attain dietary goals.;Obesity: Provide education and appropriate resources to help participant work on and attain dietary goals.;Weight Management/Obesity: Establish reasonable short term and long term weight goals.    Admit Weight  294 lb 15.6 oz (133.8 kg)    Goal Weight: Short Term  284 lb (128.8 kg)    Goal Weight: Long Term  264 lb (119.7 kg)    Expected Outcomes  Short Term: Continue to assess  and modify interventions until short term weight is achieved;Long Term: Adherence to nutrition and physical activity/exercise program aimed toward attainment of established weight goal;Weight Maintenance: Understanding of the daily nutrition guidelines, which includes 25-35% calories from fat, 7% or less cal from saturated fats, less than 200mg  cholesterol, less than 1.5gm of sodium, & 5 or more servings of fruits and vegetables daily;Weight Loss: Understanding of general recommendations for a balanced deficit meal plan, which promotes 1-2 lb weight loss per week and includes a negative energy balance of [phone] kcal/d;Understanding recommendations for meals to include 15-35% energy as protein, 25-35% energy from  fat, 35-60% energy from carbohydrates, less than 200mg  of dietary cholesterol, 20-35 gm of total fiber daily;Understanding of distribution of calorie intake throughout the day with the consumption of 4-5 meals/snacks    Tobacco Cessation  Yes    Intervention  Assist the participant in steps to quit. Provide individualized education and counseling about committing to Tobacco Cessation, relapse prevention, and pharmacological support that can be provided by physician.;Advice worker, assist with locating and accessing local/national Quit Smoking programs, and support quit date choice.    Expected Outcomes  Short Term: Will demonstrate readiness to quit, by selecting a quit date.;Short Term: Will quit all tobacco product use, adhering to prevention of relapse plan.;Long Term: Complete abstinence from all tobacco products for at least 12 months from quit date.    Diabetes  Yes    Intervention  Provide education about signs/symptoms and action to take for hypo/hyperglycemia.;Provide education about proper nutrition, including hydration, and aerobic/resistive exercise prescription along with prescribed medications to achieve blood glucose in normal ranges: Fasting glucose 65-99 mg/dL    Expected Outcomes  Short Term: Participant verbalizes understanding of the signs/symptoms and immediate care of hyper/hypoglycemia, proper foot care and importance of medication, aerobic/resistive exercise and nutrition plan for blood glucose control.;Long Term: Attainment of HbA1C < 7%.    Hypertension  Yes    Intervention  Provide education on lifestyle modifcations including regular physical activity/exercise, weight management, moderate sodium restriction and increased consumption of fresh fruit, vegetables, and low fat dairy, alcohol moderation, and smoking cessation.;Monitor prescription use compliance.    Expected Outcomes  Short Term: Continued assessment and intervention until BP is < 140/72mm HG in  hypertensive participants. < 130/16mm HG in hypertensive participants with diabetes, heart failure or chronic kidney disease.;Long Term: Maintenance of blood pressure at goal levels.    Lipids  Yes    Intervention  Provide education and support for participant on nutrition & aerobic/resistive exercise along with prescribed medications to achieve LDL 70mg , HDL >40mg .    Expected Outcomes  Short Term: Participant states understanding of desired cholesterol values and is compliant with medications prescribed. Participant is following exercise prescription and nutrition guidelines.;Long Term: Cholesterol controlled with medications as prescribed, with individualized exercise RX and with personalized nutrition plan. Value goals: LDL < 70mg , HDL > 40 mg.        Personal Goals Discharge: Goals and Risk Factor Review    Row Name 06/23/17 1029 07/20/17 0816 07/23/17 1137 08/17/17 1603 09/07/17 1521     Core Components/Risk Factors/Patient Goals Review   Personal Goals Review  Weight Management/Obesity;Tobacco Cessation;Hypertension;Lipids;Diabetes  Weight Management/Obesity;Tobacco Cessation;Hypertension;Lipids;Diabetes  Weight Management/Obesity;Tobacco Cessation;Hypertension;Lipids;Diabetes  Weight Management/Obesity;Tobacco Cessation;Hypertension;Lipids;Diabetes  Weight Management/Obesity;Tobacco Cessation;Hypertension;Lipids;Diabetes   Review  pt with multiple CAD RF demonstrates eagerness to participate in CR activities.  pt continues in determination phase of tobacco cessation and activity progression.    pt with multiple CAD RF demonstrates eagerness  to participate in CR activities.  pt continues in determination phase of tobacco cessation and activity progression.    pt with multiple CAD RF demonstrates eagerness to participate in CR activities.  pt encouraged by his increased flexability and increased stamina. pt continues in determination phase of tobacco cessation and activity progression.  pt able  to walk further distance without DOE.  pt also notes improved dietary habits.   pt with multiple CAD RF demonstrates eagerness to participate in CR activities. pt exhibits increased strength/stamina and states he looks forward to coming to exercise sessions.  pt is riding bicycle at home weather permintting.   pt continues in determination phase of tobacco cessation and activity progression.  pt able to walk further distance without DOE.  pt also notes improved dietary habits.   pt with multiple CAD RF demonstrates eagerness to participate in CR activities. pt exhibits increased strength/stamina and states he looks forward to coming to exercise sessions.  pt is riding bicycle at home weather permintting.   pt continues in determination phase of tobacco cessation and activity progression.  pt able to walk further distance without DOE.  pt also notes improved dietary habits.    Expected Outcomes  pt will continue CR exercise, nutrition and lifestyle modification to reduce overall RF.   pt will continue CR exercise, nutrition and lifestyle modification to reduce overall RF.   pt will continue CR exercise, nutrition and lifestyle modification to reduce overall RF.   pt will continue CR exercise, nutrition and lifestyle modification to reduce overall RF.   pt will continue CR exercise, nutrition and lifestyle modification to reduce overall RF.    [place] Name 09/27/17 1653             Core Components/Risk Factors/Patient Goals Review   Personal Goals Review  Weight Management/Obesity;Tobacco Cessation;Hypertension;Lipids;Diabetes       Review  pt completed CR program with 36 sessions. pt congratulated on smoking cessation. pt encouraged to maintain hone exercise regimen.  pt plans to continue exercising at [facility]         Expected Outcomes  pt will continue exercise, nutrition and lifestyle modification to reduce overall RF.           Exercise Goals and Review: Exercise Goals    Row Name 06/10/17 1404 06/10/17  1431           Exercise Goals   Increase Physical Activity  Yes  -      Intervention  Provide advice, education, support and counseling about physical activity/exercise needs.;Develop an individualized exercise prescription for aerobic and resistive training based on initial evaluation findings, risk stratification, comorbidities and participant's personal goals.  -      Expected Outcomes  Achievement of increased cardiorespiratory fitness and enhanced flexibility, muscular endurance and strength shown through measurements of functional capacity and personal statement of participant.  -      Increase Strength and Stamina  Yes  - get back to riding bike      Intervention  Provide advice, education, support and counseling about physical activity/exercise needs.;Develop an individualized exercise prescription for aerobic and resistive training based on initial evaluation findings, risk stratification, comorbidities and participant's personal goals.  -      Expected Outcomes  Achievement of increased cardiorespiratory fitness and enhanced flexibility, muscular endurance and strength shown through measurements of functional capacity and personal statement of participant.  -      Able to understand and use rate of perceived exertion (RPE) scale  Yes  -      Intervention  Provide education and explanation on how to use RPE scale  -      Expected Outcomes  Short Term: Able to use RPE daily in rehab to express subjective intensity level;Long Term:  Able to use RPE to guide intensity level when exercising independently  -      Knowledge and understanding of Target Heart Rate Range (THRR)  Yes  -      Intervention  Provide education and explanation of THRR including how the numbers were predicted and where they are located for reference  -      Expected Outcomes  Short Term: Able to state/look up THRR;Long Term: Able to use THRR to govern intensity when exercising independently;Short Term: Able to use daily as  guideline for intensity in rehab  -      Able to check pulse independently  Yes  -      Intervention  Provide education and demonstration on how to check pulse in carotid and radial arteries.;Review the importance of being able to check your own pulse for safety during independent exercise  -      Expected Outcomes  Short Term: Able to explain why pulse checking is important during independent exercise;Long Term: Able to check pulse independently and accurately  -      Understanding of Exercise Prescription  Yes  -      Intervention  Provide education, explanation, and written materials on patient's individual exercise prescription  -      Expected Outcomes  Short Term: Able to explain program exercise prescription;Long Term: Able to explain home exercise prescription to exercise independently  -         Nutrition & Weight - Outcomes: Pre Biometrics - 09/20/17 1615      Pre Biometrics   Height  5' 11.75" (1.822 m)    Weight  302 lb 0.5 oz (137 kg)  (Abnormal)     Waist Circumference  53.5 inches    Hip Circumference  47.5 inches    Waist to Hip Ratio  1.13 %    BMI (Calculated)  41.27    Triceps Skinfold  26 mm    % Body Fat  40.4 %    Grip Strength  48 kg    Flexibility  18 in    Single Leg Stand  1.99 seconds        Nutrition: Nutrition Therapy & Goals - 06/10/17 1553      Nutrition Therapy   Diet  Carb Modified, Therapeutic Lifestyle Changes      Personal Nutrition Goals   Nutrition Goal  Wt loss of 1-2 lb/week to a wt loss goal of 6-24 lb while in Cardiac Rehab. Goal wt loss is 30 lb.       Intervention Plan   Intervention  Prescribe, educate and counsel regarding individualized specific dietary modifications aiming towards targeted core components such as weight, hypertension, lipid management, diabetes, heart failure and other comorbidities.    Expected Outcomes  Short Term Goal: Understand basic principles of dietary content, such as calories, fat, sodium, cholesterol  and nutrients.;Long Term Goal: Adherence to prescribed nutrition plan.       Nutrition Discharge: Nutrition Assessments - 10/15/17 1158      MEDFICTS Scores   Pre Score  9    Post Score  24    Score Difference  15       Education Questionnaire Score: Knowledge Questionnaire Score - 09/22/17  0815      Knowledge Questionnaire Score   Post Score  21/24       Goals reviewed with patient; copy given to patient.

## 2017-11-30 NOTE — Addendum Note (Signed)
Encounter addended by: Lowell Guitar, RN on: 11/30/2017 11:50 AM  Actions taken: Sign clinical note

## 2017-11-30 NOTE — Addendum Note (Signed)
Encounter addended by: Lowell Guitar, RN on: 11/30/2017 11:46 AM  Actions taken: Episode resolved

## 2017-11-30 NOTE — Addendum Note (Signed)
Encounter addended by: Lowell Guitar, RN on: 11/30/2017 11:33 AM  Actions taken: Sign clinical note

## 2017-12-17 DIAGNOSIS — M1712 Unilateral primary osteoarthritis, left knee: Secondary | ICD-10-CM | POA: Diagnosis not present

## 2018-02-04 ENCOUNTER — Telehealth: Payer: Self-pay | Admitting: Cardiovascular Disease

## 2018-02-04 NOTE — Telephone Encounter (Signed)
Patient call stating that he has forgotten to take his Metoprolol Succinate that last two days. His blood pressure today was 161/92. He has been advised to go ahead and take his Metoprolol now and then get back on his 6 pm schedule starting tomorrow. He has verbalized his understanding and will call back with any further questions.

## 2018-02-04 NOTE — Telephone Encounter (Signed)
New Message:     Pt says he have been traveling and forgot to take his Metoprolol for the past 2 days.He wants to know if he should take one now and take another one at 6:00 the time he is supposed to take it? His blood pressure now is 161/92.

## 2018-02-22 ENCOUNTER — Telehealth: Payer: Self-pay

## 2018-02-22 NOTE — Telephone Encounter (Signed)
David Frost called concerned about taking new prescribed medication Lasix and potassium as prescribed by his Primary physician due to lower extremity swelling.  He stated that he "read up on the medications" and had some concerns about taking potassium in regards to his heart.  I advised him that not having enough potassium in the body is harmful to his heart and if taking lasix, he should take the potassium as well.  I also advised him that if he was sitting for long periods of time he could elevate his legs to help with the swelling and also wearing compression stockings could help as well.  He acknowledged receipt. All questions were answered.

## 2018-03-04 DIAGNOSIS — L568 Other specified acute skin changes due to ultraviolet radiation: Secondary | ICD-10-CM | POA: Diagnosis not present

## 2018-03-04 DIAGNOSIS — L738 Other specified follicular disorders: Secondary | ICD-10-CM | POA: Diagnosis not present

## 2018-03-04 DIAGNOSIS — L821 Other seborrheic keratosis: Secondary | ICD-10-CM | POA: Diagnosis not present

## 2018-03-04 DIAGNOSIS — L538 Other specified erythematous conditions: Secondary | ICD-10-CM | POA: Diagnosis not present

## 2018-03-04 DIAGNOSIS — L814 Other melanin hyperpigmentation: Secondary | ICD-10-CM | POA: Diagnosis not present

## 2018-03-04 DIAGNOSIS — L820 Inflamed seborrheic keratosis: Secondary | ICD-10-CM | POA: Diagnosis not present

## 2018-03-22 ENCOUNTER — Encounter: Payer: Self-pay | Admitting: Cardiovascular Disease

## 2018-03-22 ENCOUNTER — Ambulatory Visit (INDEPENDENT_AMBULATORY_CARE_PROVIDER_SITE_OTHER): Payer: Medicare Other | Admitting: Cardiovascular Disease

## 2018-03-22 VITALS — BP 142/86 | HR 62 | Ht 75.0 in | Wt 307.0 lb

## 2018-03-22 DIAGNOSIS — I4891 Unspecified atrial fibrillation: Secondary | ICD-10-CM | POA: Diagnosis not present

## 2018-03-22 DIAGNOSIS — G4733 Obstructive sleep apnea (adult) (pediatric): Secondary | ICD-10-CM | POA: Diagnosis not present

## 2018-03-22 DIAGNOSIS — E785 Hyperlipidemia, unspecified: Secondary | ICD-10-CM | POA: Diagnosis not present

## 2018-03-22 DIAGNOSIS — I2510 Atherosclerotic heart disease of native coronary artery without angina pectoris: Secondary | ICD-10-CM | POA: Diagnosis not present

## 2018-03-22 DIAGNOSIS — I10 Essential (primary) hypertension: Secondary | ICD-10-CM | POA: Diagnosis not present

## 2018-03-22 DIAGNOSIS — I214 Non-ST elevation (NSTEMI) myocardial infarction: Secondary | ICD-10-CM | POA: Diagnosis not present

## 2018-03-22 NOTE — Patient Instructions (Signed)
Medication Instructions: Your physician recommends that you continue on your current medications as directed. Please refer to the Current Medication list given to you today.  STOP Amiodarone   Labwork: Your physician recommends that you return for a FASTING lipid profile and hepatic function panel tomorrow morning.  Follow-Up: Your physician wants you to follow-up in: 1 year with Dr. Gwenlyn Found. You will receive a reminder letter in the mail two months in advance. If you don't receive a letter, please call our office to schedule the follow-up appointment.  If you need a refill on your cardiac medications before your next appointment, please call your pharmacy.

## 2018-03-22 NOTE — Progress Notes (Signed)
03/22/2018 David Frost   05-02-1951  [id number]  Primary Physician [name], MD Primary Cardiologist: Lorretta Harp MD Garret Reddish, [place], [place]  HPI:  David Frost is a 67 y.o.  severely overweight divorced Caucasian male father of 2 sons David Frost and David Frost)  who I last saw 07/23/2017. He is accompanied by his wife David Frost.He has a history of obesity, hypertension, chronic right bundle branch block and symptoms compatible sleep apnea. He hasobstructive sleep apnea on CPAP.He does admit to dietary indiscretion. He says that he loved bagels and salt. He had a Myoview stress test in 2007 which was low risk and a 2-D echo to and 10 that showed normal LV function. He had a non-STEMI in [place] 03/15/17 and was transferred to [facility] where he underwent cardiac catheterization revealing multivessel disease. He was then transferred to [facility] where he underwent coronary artery bypass grafting 6 by Dr. Servando Snare on 03/22/17. His postoperative course was uncomplicated. Soon after discharge she was remitted with A. fib with RVR from which he was symptomatic from and was rate controlled, placed on amiodarone and oral anticoagulation. On 05/04/17 he underwent outpatient DC cardioversion by Dr. Oval Linsey successfully to sinus rhythm with one shock. He has done well since.  Unfortunately, he has not been able to lose weight over the last year.  He has not exercised either he does have sleep apnea and uses CPAP intermittently.  He denies chest pain or shortness of breath.    Current Meds  Medication Sig  . acetaminophen (TYLENOL) 325 MG tablet Take 2 tablets (650 mg total) by mouth every 6 (six) hours as needed for mild pain.  Marland Kitchen ALPRAZolam (XANAX) 0.25 MG tablet Take 1 tablet (0.25 mg total) by mouth 2 (two) times daily as needed for anxiety.  Marland Kitchen aspirin EC 81 MG tablet Take 1 tablet (81 mg total) by mouth daily.  Marland Kitchen atorvastatin (LIPITOR) 40 MG tablet Take 1 tablet  (40 mg total) by mouth daily at 6 PM.  . brimonidine-timolol (COMBIGAN) 0.2-0.5 % ophthalmic solution Place 1 drop into both eyes 2 (two) times daily.   . Canagliflozin (INVOKANA) 300 MG TABS Take 300 mg by mouth daily.  Marland Kitchen loratadine (CLARITIN) 10 MG tablet Take 10 mg by mouth daily.  . metoprolol succinate (TOPROL-XL) 50 MG 24 hr tablet Take 1 tablet (50 mg total) by mouth daily.  . Polyethylene Glycol 3350 (MIRALAX PO) Take 1 Dose by mouth every other day.   Marland Kitchen PROAIR RESPICLICK 400 (90 Base) MCG/ACT AEPB Take 2 puffs by mouth as directed.  . [DISCONTINUED] amiodarone (PACERONE) 100 MG tablet Take 1 tablet (100 mg total) by mouth daily.  . [DISCONTINUED] omeprazole (PRILOSEC) 40 MG capsule Take 1 capsule (40 mg total) by mouth daily.     No Known Allergies  Social History   Socioeconomic History  . Marital status: Legally Separated    Spouse name: Not on file  . Number of children: Not on file  . Years of education: Not on file  . Highest education level: Not on file  Occupational History  . Not on file  Social Needs  . Financial resource strain: Not on file  . Food insecurity:    Worry: Not on file    Inability: Not on file  . Transportation needs:    Medical: Not on file    Non-medical: Not on file  Tobacco Use  . Smoking status: Former Smoker  Years: 2.00    Types: Cigars    Last attempt to quit: 03/08/2017    Years since quitting: 1.0  . Smokeless tobacco: Never Used  Substance and Sexual Activity  . Alcohol use: No    Alcohol/week: 42.0 oz    Types: 70 Cans of beer per week    Comment: pt states not drinking since CABG  . Drug use: No  . Sexual activity: Not on file  Lifestyle  . Physical activity:    Days per week: Not on file    Minutes per session: Not on file  . Stress: Not on file  Relationships  . Social connections:    Talks on phone: Not on file    Gets together: Not on file    Attends religious service: Not on file    Active member of club or  organization: Not on file    Attends meetings of clubs or organizations: Not on file    Relationship status: Not on file  . Intimate partner violence:    Fear of current or ex partner: Not on file    Emotionally abused: Not on file    Physically abused: Not on file    Forced sexual activity: Not on file  Other Topics Concern  . Not on file  Social History Narrative  . Not on file     Review of Systems: General: negative for chills, fever, night sweats or weight changes.  Cardiovascular: negative for chest pain, dyspnea on exertion, edema, orthopnea, palpitations, paroxysmal nocturnal dyspnea or shortness of breath Dermatological: negative for rash Respiratory: negative for cough or wheezing Urologic: negative for hematuria Abdominal: negative for nausea, vomiting, diarrhea, bright red blood per rectum, melena, or hematemesis Neurologic: negative for visual changes, syncope, or dizziness All other systems reviewed and are otherwise negative except as noted above.    Blood pressure (!) 142/86, pulse 62, height 6\' 3"  (1.905 m), weight (!) 307 lb (139.3 kg).  General appearance: alert and no distress Neck: no adenopathy, no carotid bruit, no JVD, supple, symmetrical, trachea midline and thyroid not enlarged, symmetric, no tenderness/mass/nodules Lungs: clear to auscultation bilaterally Heart: regular rate and rhythm, S1, S2 normal, no murmur, click, rub or gallop Extremities: extremities normal, atraumatic, no cyanosis or edema Pulses: 2+ and symmetric Skin: Skin color, texture, turgor normal. No rashes or lesions Neurologic: Alert and oriented X 3, normal strength and tone. Normal symmetric reflexes. Normal coordination and gait  EKG sinus rhythm at 62 with right bundle branch block and left axis deviation.  I personally reviewed this EKG.  ASSESSMENT AND PLAN:   Essential hypertension History of essential hypertension her blood pressure measured at 142/86.  He is on metoprolol.   Continue current meds at current dosing.  Obstructive sleep apnea History of obstructive sleep apnea on CPAP intermittently.  CAD, multiple vessel History of multivessel CAD status post bypass grafting x6 by Dr. Servando Snare STEMI was in [place] and being referred [place] where he had cardiac catheterization.  His postop course was uncomplicated.  He said no recurrent symptoms.  Atrial fibrillation with RVR (HCC) History of postop A. fib on amiodarone maintaining sinus rhythm.'s been a year since his surgery.  I am going to stop his amiodarone.  Hyperlipidemia LDL goal <70 History of hyperlipidemia on statin therapy not at goal.  We will recheck a lipid and liver panel.  He may be a candidate for the addition of Repatha.Lorretta Harp MD FACP,FACC,FAHA,  FSCAI 03/22/2018 8:23 AM

## 2018-03-22 NOTE — Assessment & Plan Note (Signed)
History of obstructive sleep apnea on CPAP intermittently.

## 2018-03-22 NOTE — Assessment & Plan Note (Signed)
History of multivessel CAD status post bypass grafting x6 by Dr. Servando Snare STEMI was in [place] and being referred [place] where he had cardiac catheterization.  His postop course was uncomplicated.  He said no recurrent symptoms.

## 2018-03-22 NOTE — Assessment & Plan Note (Signed)
History of postop A. fib on amiodarone maintaining sinus rhythm.'s been a year since his surgery.  I am going to stop his amiodarone.

## 2018-03-22 NOTE — Assessment & Plan Note (Signed)
History of hyperlipidemia on statin therapy not at goal.  We will recheck a lipid and liver panel.  He may be a candidate for the addition of Repatha.Marland Kitchen

## 2018-03-22 NOTE — Assessment & Plan Note (Signed)
History of essential hypertension her blood pressure measured at 142/86.  He is on metoprolol.  Continue current meds at current dosing.

## 2018-03-23 DIAGNOSIS — E785 Hyperlipidemia, unspecified: Secondary | ICD-10-CM | POA: Diagnosis not present

## 2018-03-23 LAB — LIPID PANEL
CHOLESTEROL TOTAL: 169 mg/dL (ref 100–199)
Chol/HDL Ratio: 3.6 ratio (ref 0.0–5.0)
HDL: 47 mg/dL (ref >39)
LDL CALC: 95 mg/dL (ref 0–99)
TRIGLYCERIDES: 135 mg/dL (ref 0–149)
VLDL CHOLESTEROL CAL: 27 mg/dL (ref 5–40)

## 2018-03-23 LAB — HEPATIC FUNCTION PANEL
ALBUMIN: 4.3 g/dL (ref 3.6–4.8)
ALK PHOS: 86 IU/L (ref 39–117)
ALT: 25 IU/L (ref 0–44)
AST: 15 IU/L (ref 0–40)
BILIRUBIN TOTAL: 0.6 mg/dL (ref 0.0–1.2)
Bilirubin, Direct: 0.17 mg/dL (ref 0.00–0.40)
Total Protein: 6.4 g/dL (ref 6.0–8.5)

## 2018-03-25 ENCOUNTER — Telehealth: Payer: Self-pay | Admitting: Cardiovascular Disease

## 2018-03-25 ENCOUNTER — Encounter: Payer: Self-pay | Admitting: *Deleted

## 2018-03-25 NOTE — Telephone Encounter (Signed)
Patient aware of results  Notes recorded by Lorretta Harp, MD on 03/24/2018 at 9:37 AM EDT Improved lipid profile from a year ago especially lower triglycerides. He will not at goal for secondary prevention. Encouraged heart healthy diet.

## 2018-03-25 NOTE — Telephone Encounter (Signed)
New message ° ° ° °Patient calling for lab results °

## 2018-03-31 ENCOUNTER — Other Ambulatory Visit: Payer: Self-pay | Admitting: Pharmacist

## 2018-03-31 MED ORDER — EVOLOCUMAB 140 MG/ML ~~LOC~~ SOAJ: 140.0000 mg | SUBCUTANEOUS | 11 refills | Status: DC

## 2018-03-31 NOTE — Telephone Encounter (Signed)
Rx sent to [facility] pharmacy for Repatha 140mg  (arroved by insurance)

## 2018-04-01 ENCOUNTER — Telehealth: Payer: Self-pay | Admitting: Pharmacist

## 2018-04-01 NOTE — Telephone Encounter (Signed)
Prior authorization approved by insurance and Rx sent to prefer pharmacy.   Talked to patient by phone, he  refuses Repatha at this time "after discussing with Dr Gwenlyn Found". Will work on lifestyle modifications ONLY.  Repatha Rx cancelled and medication removed from medication list.

## 2018-04-05 DIAGNOSIS — Z6841 Body Mass Index (BMI) 40.0 and over, adult: Secondary | ICD-10-CM | POA: Diagnosis not present

## 2018-04-05 DIAGNOSIS — R05 Cough: Secondary | ICD-10-CM | POA: Diagnosis not present

## 2018-04-05 DIAGNOSIS — I10 Essential (primary) hypertension: Secondary | ICD-10-CM | POA: Diagnosis not present

## 2018-04-05 DIAGNOSIS — F419 Anxiety disorder, unspecified: Secondary | ICD-10-CM | POA: Diagnosis not present

## 2018-04-05 DIAGNOSIS — G4733 Obstructive sleep apnea (adult) (pediatric): Secondary | ICD-10-CM | POA: Diagnosis not present

## 2018-04-05 DIAGNOSIS — R635 Abnormal weight gain: Secondary | ICD-10-CM | POA: Diagnosis not present

## 2018-04-05 DIAGNOSIS — E1151 Type 2 diabetes mellitus with diabetic peripheral angiopathy without gangrene: Secondary | ICD-10-CM | POA: Diagnosis not present

## 2018-04-05 DIAGNOSIS — E782 Mixed hyperlipidemia: Secondary | ICD-10-CM | POA: Diagnosis not present

## 2018-04-05 DIAGNOSIS — R0609 Other forms of dyspnea: Secondary | ICD-10-CM | POA: Diagnosis not present

## 2018-04-05 DIAGNOSIS — I25810 Atherosclerosis of coronary artery bypass graft(s) without angina pectoris: Secondary | ICD-10-CM | POA: Diagnosis not present

## 2018-04-08 ENCOUNTER — Other Ambulatory Visit: Payer: Self-pay | Admitting: Cardiovascular Disease

## 2018-04-08 ENCOUNTER — Other Ambulatory Visit: Payer: Self-pay | Admitting: Student

## 2018-04-08 NOTE — Telephone Encounter (Signed)
This is Dr. Berry's pt 

## 2018-04-08 NOTE — Telephone Encounter (Signed)
Rx request sent to pharmacy.  

## 2018-04-12 DIAGNOSIS — M1712 Unilateral primary osteoarthritis, left knee: Secondary | ICD-10-CM | POA: Diagnosis not present

## 2018-04-12 DIAGNOSIS — M25562 Pain in left knee: Secondary | ICD-10-CM | POA: Diagnosis not present

## 2018-04-20 DIAGNOSIS — M1712 Unilateral primary osteoarthritis, left knee: Secondary | ICD-10-CM | POA: Diagnosis not present

## 2018-04-22 DIAGNOSIS — E1151 Type 2 diabetes mellitus with diabetic peripheral angiopathy without gangrene: Secondary | ICD-10-CM | POA: Diagnosis not present

## 2018-04-22 DIAGNOSIS — I25810 Atherosclerosis of coronary artery bypass graft(s) without angina pectoris: Secondary | ICD-10-CM | POA: Diagnosis not present

## 2018-04-22 DIAGNOSIS — Z1389 Encounter for screening for other disorder: Secondary | ICD-10-CM | POA: Diagnosis not present

## 2018-04-22 DIAGNOSIS — F418 Other specified anxiety disorders: Secondary | ICD-10-CM | POA: Diagnosis not present

## 2018-04-22 DIAGNOSIS — I7389 Other specified peripheral vascular diseases: Secondary | ICD-10-CM | POA: Diagnosis not present

## 2018-04-22 DIAGNOSIS — I10 Essential (primary) hypertension: Secondary | ICD-10-CM | POA: Diagnosis not present

## 2018-04-22 DIAGNOSIS — G4733 Obstructive sleep apnea (adult) (pediatric): Secondary | ICD-10-CM | POA: Diagnosis not present

## 2018-04-22 DIAGNOSIS — I480 Paroxysmal atrial fibrillation: Secondary | ICD-10-CM | POA: Diagnosis not present

## 2018-04-22 DIAGNOSIS — Z6841 Body Mass Index (BMI) 40.0 and over, adult: Secondary | ICD-10-CM | POA: Diagnosis not present

## 2018-04-27 DIAGNOSIS — M1712 Unilateral primary osteoarthritis, left knee: Secondary | ICD-10-CM | POA: Diagnosis not present

## 2018-05-20 DIAGNOSIS — I87319 Chronic venous hypertension (idiopathic) with ulcer of unspecified lower extremity: Secondary | ICD-10-CM | POA: Diagnosis not present

## 2018-05-20 DIAGNOSIS — I739 Peripheral vascular disease, unspecified: Secondary | ICD-10-CM | POA: Diagnosis not present

## 2018-05-20 DIAGNOSIS — R609 Edema, unspecified: Secondary | ICD-10-CM | POA: Diagnosis not present

## 2018-05-20 DIAGNOSIS — E1151 Type 2 diabetes mellitus with diabetic peripheral angiopathy without gangrene: Secondary | ICD-10-CM | POA: Diagnosis not present

## 2018-05-20 DIAGNOSIS — L03116 Cellulitis of left lower limb: Secondary | ICD-10-CM | POA: Diagnosis not present

## 2018-05-20 DIAGNOSIS — Z6841 Body Mass Index (BMI) 40.0 and over, adult: Secondary | ICD-10-CM | POA: Diagnosis not present

## 2018-05-22 ENCOUNTER — Other Ambulatory Visit: Payer: Self-pay | Admitting: Student

## 2018-05-24 NOTE — Telephone Encounter (Signed)
Rx sent to pharmacy   

## 2018-06-06 DIAGNOSIS — M1712 Unilateral primary osteoarthritis, left knee: Secondary | ICD-10-CM | POA: Diagnosis not present

## 2018-06-06 DIAGNOSIS — M1711 Unilateral primary osteoarthritis, right knee: Secondary | ICD-10-CM | POA: Diagnosis not present

## 2018-06-30 ENCOUNTER — Other Ambulatory Visit: Payer: Self-pay | Admitting: Student

## 2018-07-01 ENCOUNTER — Other Ambulatory Visit: Payer: Self-pay | Admitting: Cardiovascular Disease

## 2018-08-01 ENCOUNTER — Other Ambulatory Visit: Payer: Self-pay | Admitting: Cardiovascular Disease

## 2018-08-01 NOTE — Telephone Encounter (Signed)
Rx request sent to pharmacy.  

## 2018-08-04 DIAGNOSIS — L0889 Other specified local infections of the skin and subcutaneous tissue: Secondary | ICD-10-CM | POA: Diagnosis not present

## 2018-08-04 DIAGNOSIS — I10 Essential (primary) hypertension: Secondary | ICD-10-CM | POA: Diagnosis not present

## 2018-08-04 DIAGNOSIS — Z6841 Body Mass Index (BMI) 40.0 and over, adult: Secondary | ICD-10-CM | POA: Diagnosis not present

## 2018-08-04 DIAGNOSIS — I25810 Atherosclerosis of coronary artery bypass graft(s) without angina pectoris: Secondary | ICD-10-CM | POA: Diagnosis not present

## 2018-08-04 DIAGNOSIS — G4733 Obstructive sleep apnea (adult) (pediatric): Secondary | ICD-10-CM | POA: Diagnosis not present

## 2018-08-04 DIAGNOSIS — E119 Type 2 diabetes mellitus without complications: Secondary | ICD-10-CM | POA: Diagnosis not present

## 2018-08-17 DIAGNOSIS — H2513 Age-related nuclear cataract, bilateral: Secondary | ICD-10-CM | POA: Diagnosis not present

## 2018-08-17 DIAGNOSIS — H40053 Ocular hypertension, bilateral: Secondary | ICD-10-CM | POA: Diagnosis not present

## 2018-08-17 DIAGNOSIS — H35371 Puckering of macula, right eye: Secondary | ICD-10-CM | POA: Diagnosis not present

## 2018-08-17 DIAGNOSIS — H04123 Dry eye syndrome of bilateral lacrimal glands: Secondary | ICD-10-CM | POA: Diagnosis not present

## 2018-08-17 DIAGNOSIS — E119 Type 2 diabetes mellitus without complications: Secondary | ICD-10-CM | POA: Diagnosis not present

## 2018-08-22 DIAGNOSIS — E1151 Type 2 diabetes mellitus with diabetic peripheral angiopathy without gangrene: Secondary | ICD-10-CM | POA: Diagnosis not present

## 2018-08-22 DIAGNOSIS — I10 Essential (primary) hypertension: Secondary | ICD-10-CM | POA: Diagnosis not present

## 2018-08-22 DIAGNOSIS — R635 Abnormal weight gain: Secondary | ICD-10-CM | POA: Diagnosis not present

## 2018-08-22 DIAGNOSIS — G4733 Obstructive sleep apnea (adult) (pediatric): Secondary | ICD-10-CM | POA: Diagnosis not present

## 2018-09-12 ENCOUNTER — Other Ambulatory Visit: Payer: Self-pay | Admitting: Physician Assistant

## 2018-09-22 ENCOUNTER — Other Ambulatory Visit: Payer: Self-pay | Admitting: Cardiovascular Disease

## 2018-09-28 DIAGNOSIS — H353131 Nonexudative age-related macular degeneration, bilateral, early dry stage: Secondary | ICD-10-CM | POA: Diagnosis not present

## 2018-09-28 DIAGNOSIS — H40053 Ocular hypertension, bilateral: Secondary | ICD-10-CM | POA: Diagnosis not present

## 2018-09-28 DIAGNOSIS — E119 Type 2 diabetes mellitus without complications: Secondary | ICD-10-CM | POA: Diagnosis not present

## 2018-09-28 DIAGNOSIS — H35371 Puckering of macula, right eye: Secondary | ICD-10-CM | POA: Diagnosis not present

## 2018-09-28 DIAGNOSIS — H2513 Age-related nuclear cataract, bilateral: Secondary | ICD-10-CM | POA: Diagnosis not present

## 2018-09-28 DIAGNOSIS — H04123 Dry eye syndrome of bilateral lacrimal glands: Secondary | ICD-10-CM | POA: Diagnosis not present

## 2018-10-24 DIAGNOSIS — I480 Paroxysmal atrial fibrillation: Secondary | ICD-10-CM | POA: Diagnosis not present

## 2018-10-24 DIAGNOSIS — I10 Essential (primary) hypertension: Secondary | ICD-10-CM | POA: Diagnosis not present

## 2018-10-24 DIAGNOSIS — I25810 Atherosclerosis of coronary artery bypass graft(s) without angina pectoris: Secondary | ICD-10-CM | POA: Diagnosis not present

## 2018-10-24 DIAGNOSIS — Z6841 Body Mass Index (BMI) 40.0 and over, adult: Secondary | ICD-10-CM | POA: Diagnosis not present

## 2018-10-24 DIAGNOSIS — E1151 Type 2 diabetes mellitus with diabetic peripheral angiopathy without gangrene: Secondary | ICD-10-CM | POA: Diagnosis not present

## 2018-10-24 DIAGNOSIS — G4733 Obstructive sleep apnea (adult) (pediatric): Secondary | ICD-10-CM | POA: Diagnosis not present

## 2018-11-01 DIAGNOSIS — E1151 Type 2 diabetes mellitus with diabetic peripheral angiopathy without gangrene: Secondary | ICD-10-CM | POA: Diagnosis not present

## 2018-11-01 DIAGNOSIS — F4321 Adjustment disorder with depressed mood: Secondary | ICD-10-CM | POA: Diagnosis not present

## 2018-11-01 DIAGNOSIS — I10 Essential (primary) hypertension: Secondary | ICD-10-CM | POA: Diagnosis not present

## 2018-11-01 DIAGNOSIS — Z6841 Body Mass Index (BMI) 40.0 and over, adult: Secondary | ICD-10-CM | POA: Diagnosis not present

## 2018-11-01 DIAGNOSIS — I25810 Atherosclerosis of coronary artery bypass graft(s) without angina pectoris: Secondary | ICD-10-CM | POA: Diagnosis not present

## 2018-11-01 DIAGNOSIS — E782 Mixed hyperlipidemia: Secondary | ICD-10-CM | POA: Diagnosis not present

## 2018-11-01 DIAGNOSIS — I480 Paroxysmal atrial fibrillation: Secondary | ICD-10-CM | POA: Diagnosis not present

## 2018-11-10 DIAGNOSIS — I10 Essential (primary) hypertension: Secondary | ICD-10-CM | POA: Diagnosis not present

## 2018-11-10 DIAGNOSIS — G4733 Obstructive sleep apnea (adult) (pediatric): Secondary | ICD-10-CM | POA: Diagnosis not present

## 2018-11-10 DIAGNOSIS — Z6841 Body Mass Index (BMI) 40.0 and over, adult: Secondary | ICD-10-CM | POA: Diagnosis not present

## 2018-11-10 DIAGNOSIS — I480 Paroxysmal atrial fibrillation: Secondary | ICD-10-CM | POA: Diagnosis not present

## 2018-11-30 DIAGNOSIS — I10 Essential (primary) hypertension: Secondary | ICD-10-CM | POA: Diagnosis not present

## 2018-11-30 DIAGNOSIS — E1151 Type 2 diabetes mellitus with diabetic peripheral angiopathy without gangrene: Secondary | ICD-10-CM | POA: Diagnosis not present

## 2018-12-27 IMAGING — CR DG CHEST 1V PORT
2 series · 2 of 2 positions shown · non-contrast
Comparison: Chest x-ray of March 18, 2017

CLINICAL DATA: Status post CABG

EXAM:
PORTABLE CHEST 1 VIEW

[AP (1 of 2)]
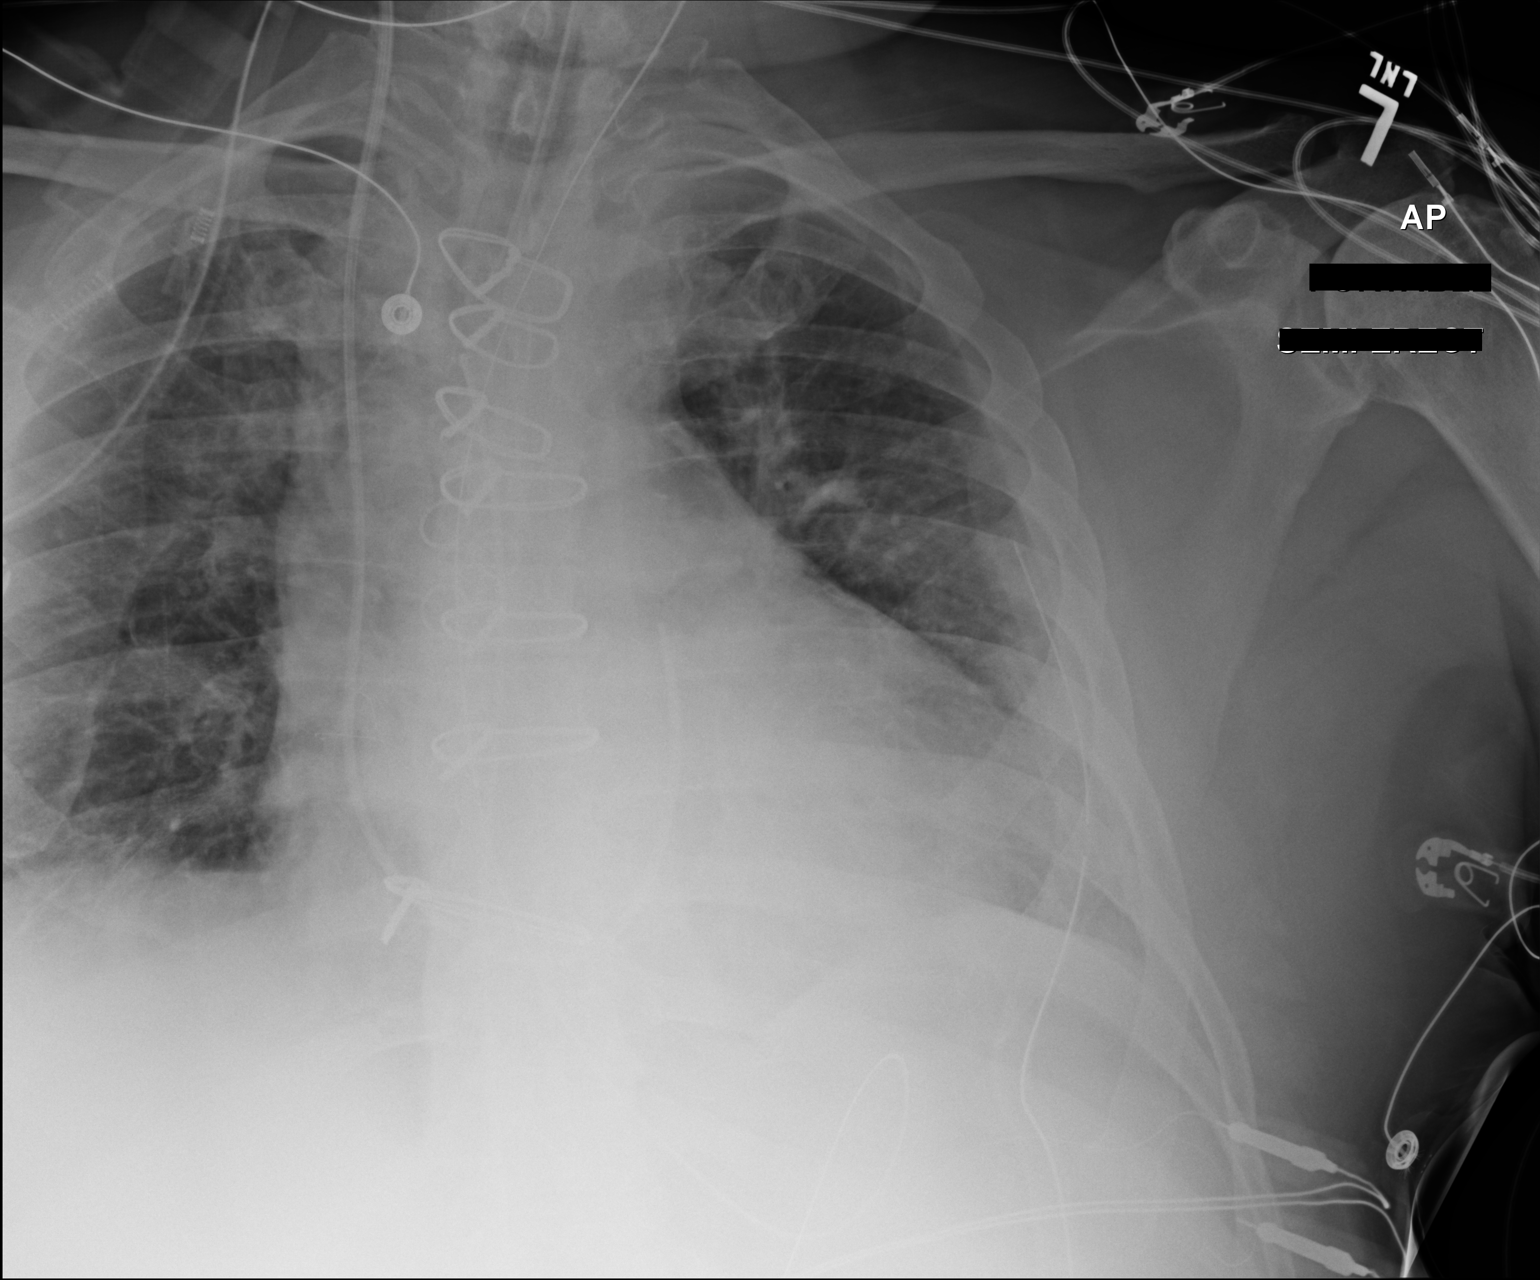

[AP (2 of 2)]
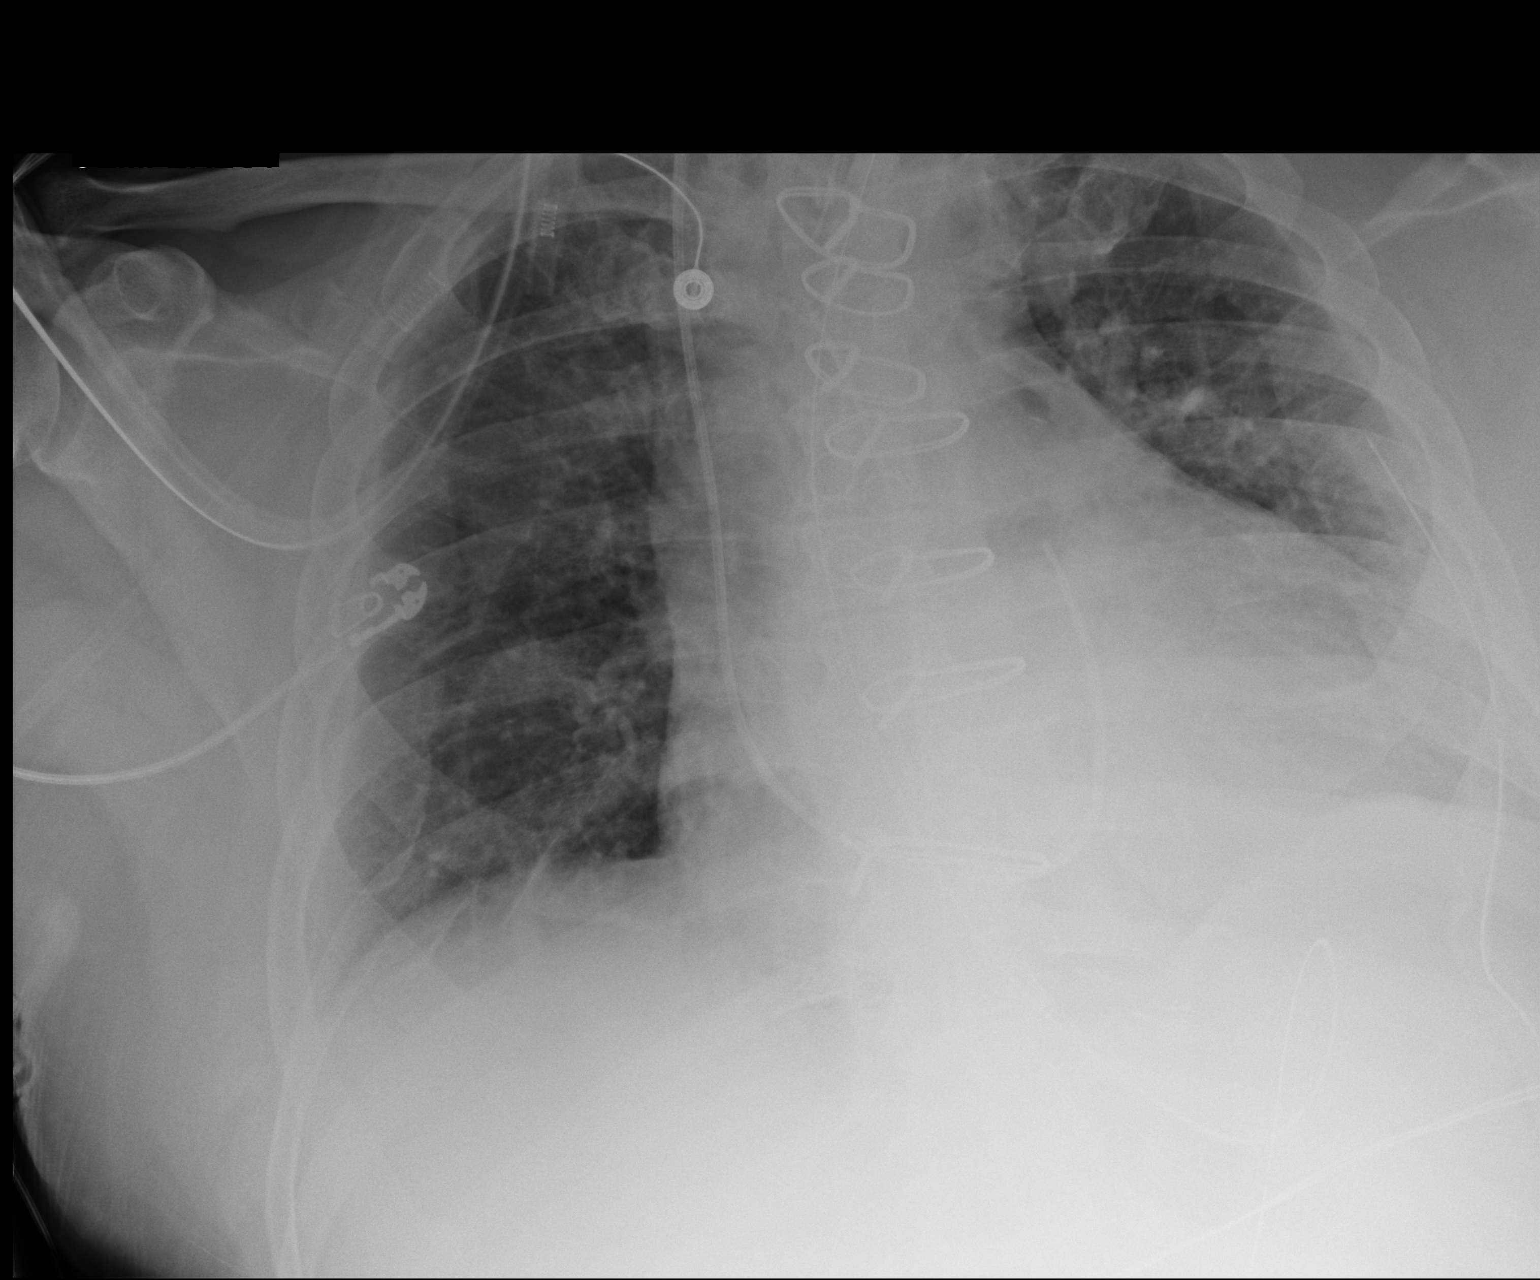

[2 of 2 positions shown; findings below may reference images not displayed]

FINDINGS: The lungs are adequately inflated. There is no pneumothorax nor
large pleural effusion. The cardiac silhouette is enlarged. The
pulmonary vascularity is mildly prominent and the interstitial
markings are mildly increased. The Swan-Ganz catheter tip projects
in the proximal main pulmonary artery. The left chest tube tip
projects over the lateral aspect of the 6 rib. A mediastinal drain
is present and projects at approximately the T4 level. The
endotracheal tube tip projects approximately 5.8 cm above the
carina. The esophagogastric tube tip and proximal port project below
the GE junction.
IMPRESSION: Post CABG changes. Mild pulmonary edema. No pneumothorax,
significant pleural effusion, nor significant atelectasis.

The support tubes are in reasonable position.

## 2018-12-30 IMAGING — CR DG CHEST 2V
2 series · 2 of 2 positions shown · non-contrast
Comparison: Portable chest x-ray March 24, 2017

CLINICAL DATA: Postoperative soreness ; CABG on March 22, 2017

EXAM:
CHEST  2 VIEW

[chest pa]
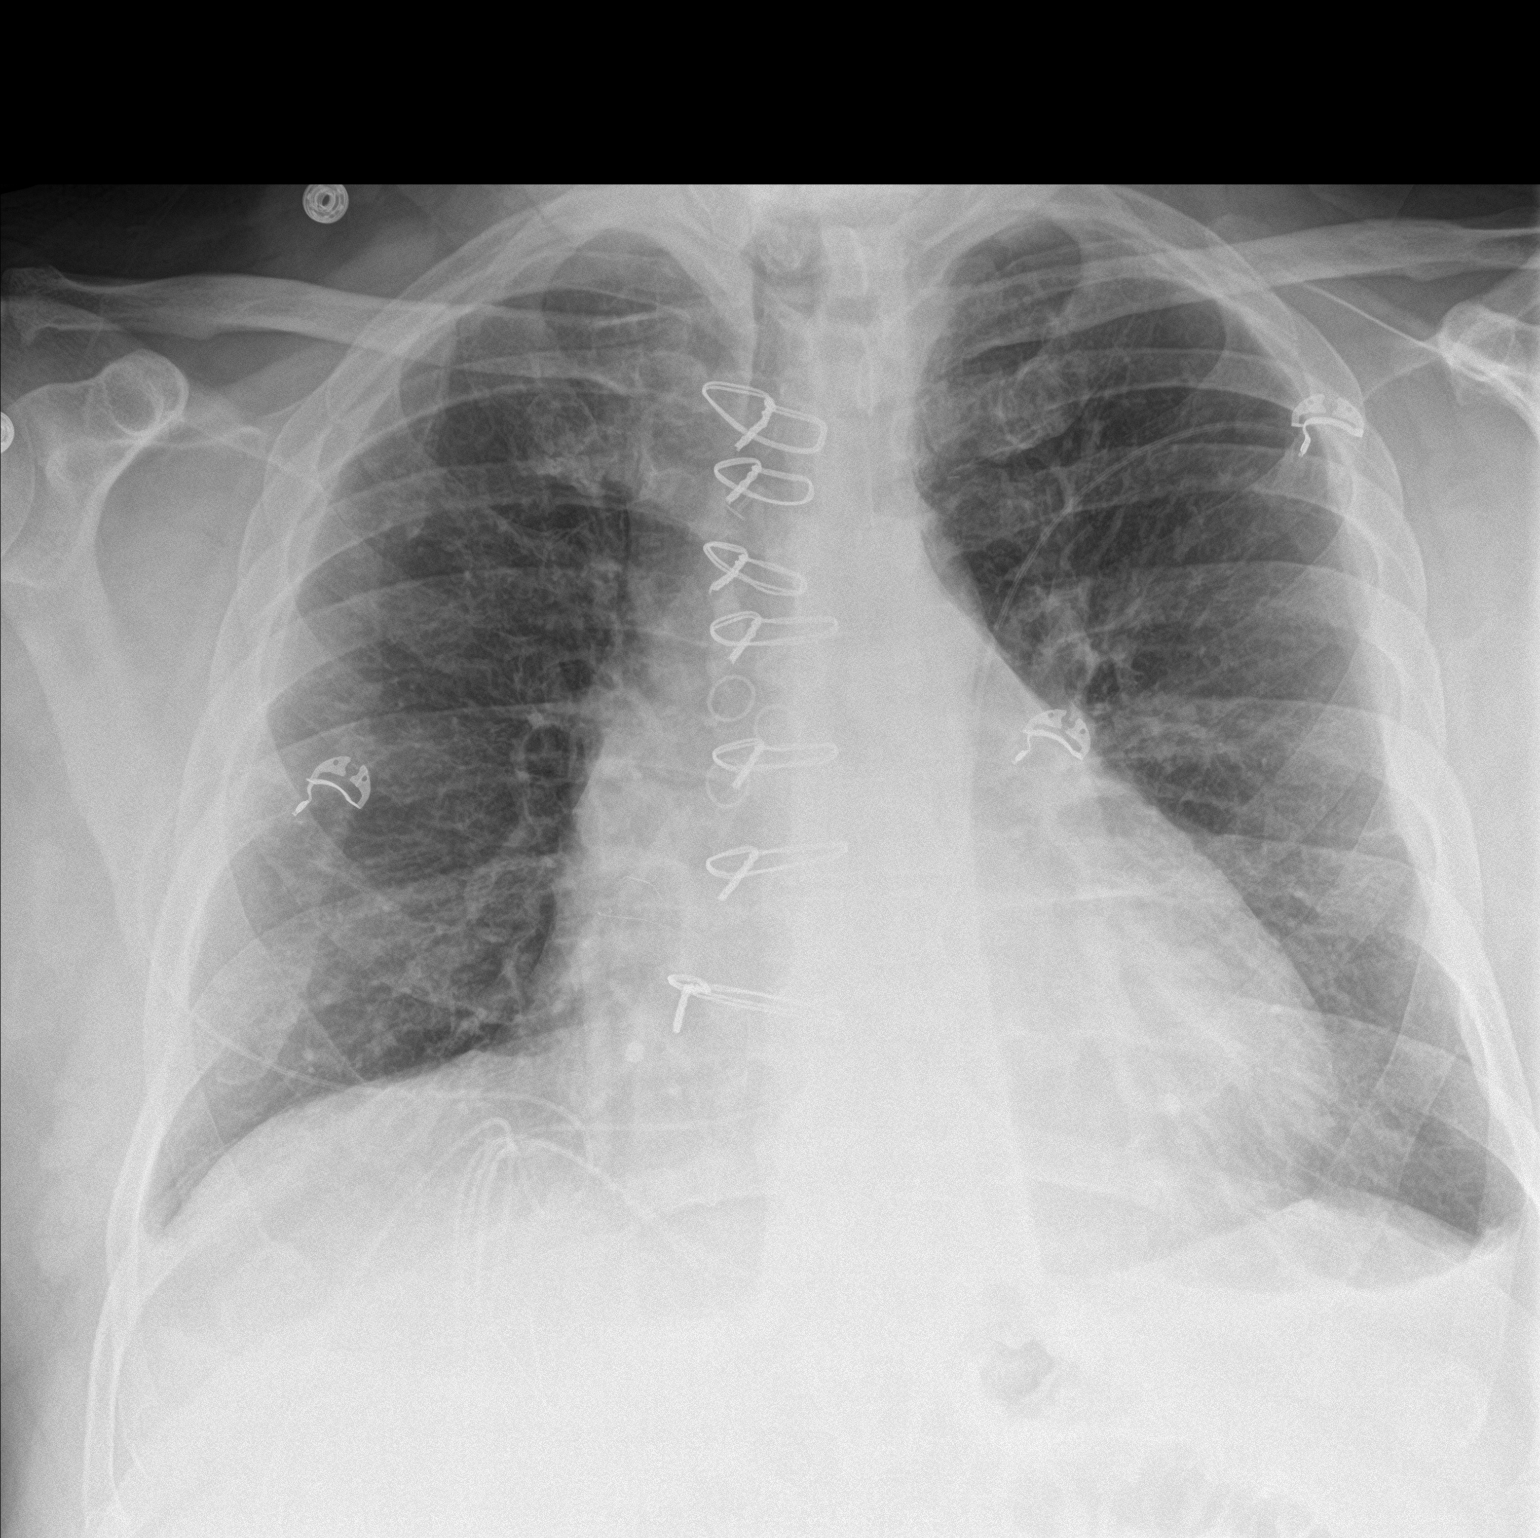

[chest lat]
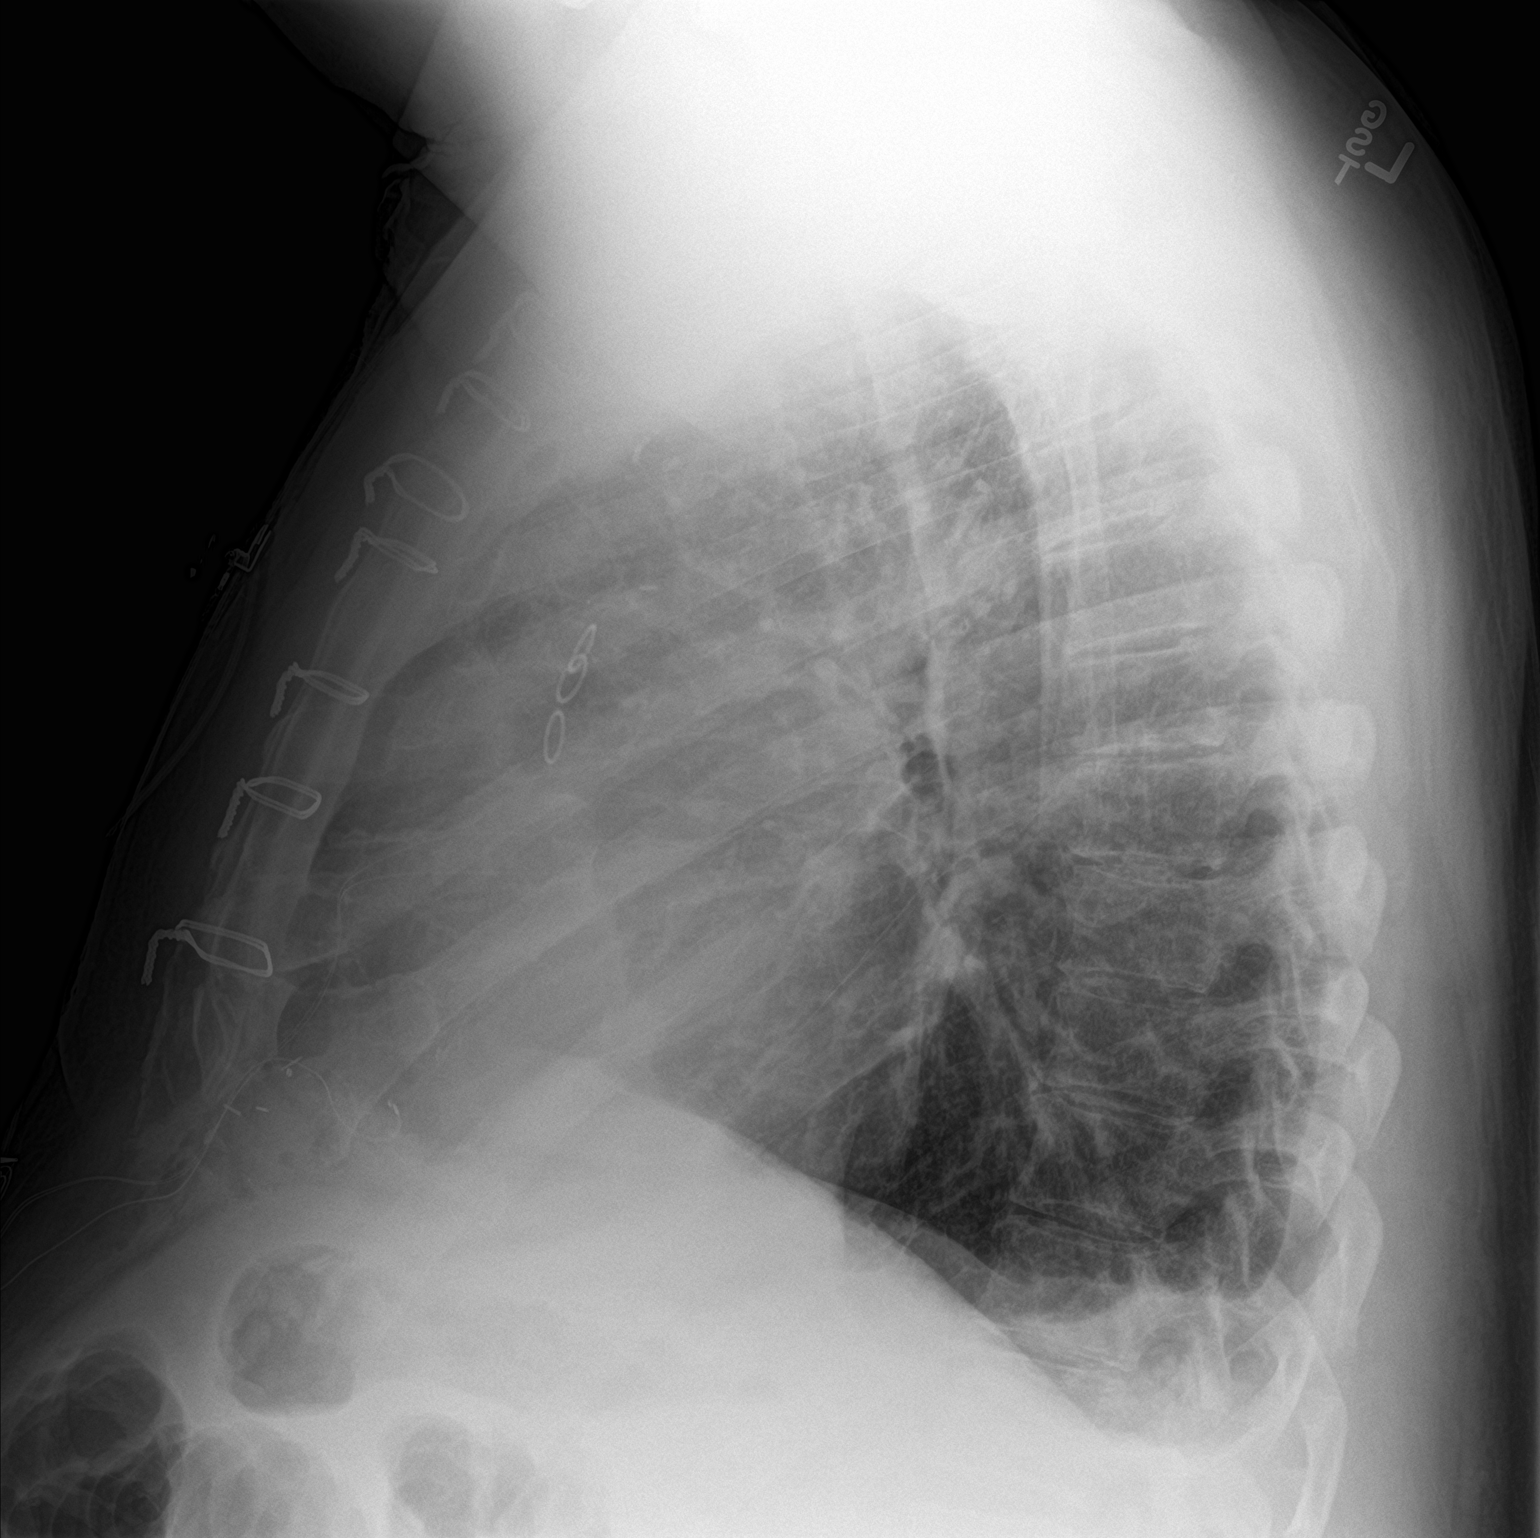

[2 of 2 positions shown; findings below may reference images not displayed]

FINDINGS: The lungs are adequately inflated. There is a small left pleural
effusion and trace right pleural effusion. There is no alveolar
infiltrate. There is no pneumothorax. The interstitial markings are
mildly prominent but stable The cardiac silhouette is enlarged. The
pulmonary vascularity is not engorged. The sternal wires are intact.
The mediastinum is normal in width. The retrosternal soft tissues
are normal.
IMPRESSION: Small bilateral pleural effusions greatest on the left. No evidence
of pneumonia nor pneumothorax. Mild interstitial prominence likely
reflects stable low-grade edema.

## 2019-01-07 IMAGING — DX DG CHEST 1V PORT
1 series · 1 of 1 positions shown · non-contrast
Comparison: March 25, 2017

CLINICAL DATA: Atrial fibrillation.  Hypertension.

EXAM:
PORTABLE CHEST 1 VIEW

[chest ap]
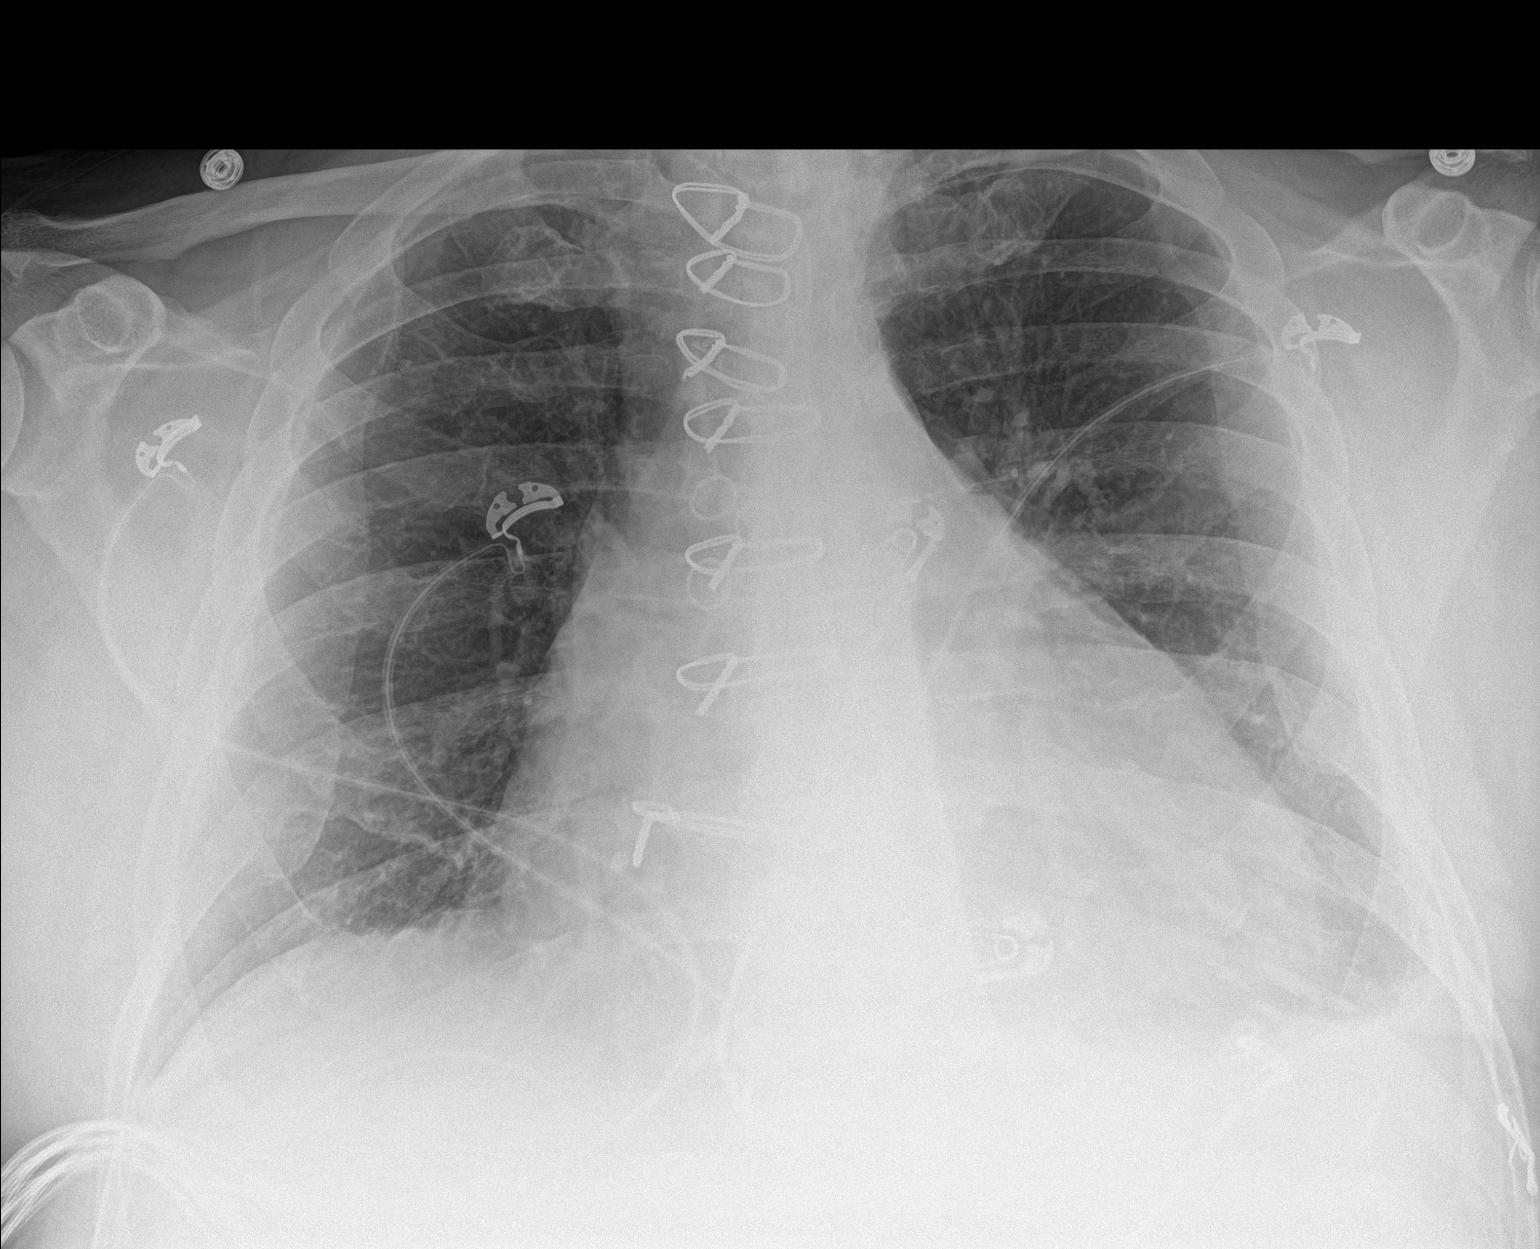

[1 of 1 positions shown; findings below may reference images not displayed]

FINDINGS: There is a small left pleural effusion with mild left base
atelectasis. Lungs elsewhere are clear. There is cardiomegaly,
stable. The pulmonary vascularity is normal. No adenopathy. Patient
is status post coronary artery bypass grafting. No evident bone
lesions.
IMPRESSION: Stable cardiomegaly. Small left pleural effusion with mild left base
atelectasis. Lungs elsewhere clear.

## 2019-01-19 DIAGNOSIS — M79644 Pain in right finger(s): Secondary | ICD-10-CM | POA: Diagnosis not present

## 2019-02-22 DIAGNOSIS — H04123 Dry eye syndrome of bilateral lacrimal glands: Secondary | ICD-10-CM | POA: Diagnosis not present

## 2019-02-22 DIAGNOSIS — H40053 Ocular hypertension, bilateral: Secondary | ICD-10-CM | POA: Diagnosis not present

## 2019-02-22 DIAGNOSIS — H2513 Age-related nuclear cataract, bilateral: Secondary | ICD-10-CM | POA: Diagnosis not present

## 2019-02-22 DIAGNOSIS — E119 Type 2 diabetes mellitus without complications: Secondary | ICD-10-CM | POA: Diagnosis not present

## 2019-02-22 DIAGNOSIS — H35371 Puckering of macula, right eye: Secondary | ICD-10-CM | POA: Diagnosis not present

## 2019-02-22 DIAGNOSIS — H353131 Nonexudative age-related macular degeneration, bilateral, early dry stage: Secondary | ICD-10-CM | POA: Diagnosis not present

## 2019-03-08 ENCOUNTER — Other Ambulatory Visit: Payer: Self-pay

## 2019-04-27 DIAGNOSIS — Z125 Encounter for screening for malignant neoplasm of prostate: Secondary | ICD-10-CM | POA: Diagnosis not present

## 2019-04-27 DIAGNOSIS — E782 Mixed hyperlipidemia: Secondary | ICD-10-CM | POA: Diagnosis not present

## 2019-04-27 DIAGNOSIS — E1151 Type 2 diabetes mellitus with diabetic peripheral angiopathy without gangrene: Secondary | ICD-10-CM | POA: Diagnosis not present

## 2019-04-27 DIAGNOSIS — R82998 Other abnormal findings in urine: Secondary | ICD-10-CM | POA: Diagnosis not present

## 2019-05-08 ENCOUNTER — Other Ambulatory Visit: Payer: Self-pay | Admitting: Cardiovascular Disease

## 2019-05-08 DIAGNOSIS — I739 Peripheral vascular disease, unspecified: Secondary | ICD-10-CM | POA: Diagnosis not present

## 2019-05-08 DIAGNOSIS — F4321 Adjustment disorder with depressed mood: Secondary | ICD-10-CM | POA: Diagnosis not present

## 2019-05-08 DIAGNOSIS — Z0000 Encounter for general adult medical examination without abnormal findings: Secondary | ICD-10-CM | POA: Diagnosis not present

## 2019-05-08 DIAGNOSIS — Z1339 Encounter for screening examination for other mental health and behavioral disorders: Secondary | ICD-10-CM | POA: Diagnosis not present

## 2019-05-08 DIAGNOSIS — E1151 Type 2 diabetes mellitus with diabetic peripheral angiopathy without gangrene: Secondary | ICD-10-CM | POA: Diagnosis not present

## 2019-05-08 DIAGNOSIS — I10 Essential (primary) hypertension: Secondary | ICD-10-CM | POA: Diagnosis not present

## 2019-05-08 DIAGNOSIS — I480 Paroxysmal atrial fibrillation: Secondary | ICD-10-CM | POA: Diagnosis not present

## 2019-05-08 DIAGNOSIS — I25810 Atherosclerosis of coronary artery bypass graft(s) without angina pectoris: Secondary | ICD-10-CM | POA: Diagnosis not present

## 2019-05-08 DIAGNOSIS — K219 Gastro-esophageal reflux disease without esophagitis: Secondary | ICD-10-CM | POA: Diagnosis not present

## 2019-05-08 DIAGNOSIS — R609 Edema, unspecified: Secondary | ICD-10-CM | POA: Diagnosis not present

## 2019-05-08 DIAGNOSIS — E782 Mixed hyperlipidemia: Secondary | ICD-10-CM | POA: Diagnosis not present

## 2019-05-08 DIAGNOSIS — G4733 Obstructive sleep apnea (adult) (pediatric): Secondary | ICD-10-CM | POA: Diagnosis not present

## 2019-06-23 DIAGNOSIS — H40053 Ocular hypertension, bilateral: Secondary | ICD-10-CM | POA: Diagnosis not present

## 2019-06-23 DIAGNOSIS — H04123 Dry eye syndrome of bilateral lacrimal glands: Secondary | ICD-10-CM | POA: Diagnosis not present

## 2019-06-23 DIAGNOSIS — H2513 Age-related nuclear cataract, bilateral: Secondary | ICD-10-CM | POA: Diagnosis not present

## 2019-07-16 ENCOUNTER — Other Ambulatory Visit: Payer: Self-pay | Admitting: Cardiovascular Disease

## 2019-07-17 ENCOUNTER — Other Ambulatory Visit: Payer: Self-pay | Admitting: Cardiovascular Disease

## 2019-07-27 DIAGNOSIS — Z23 Encounter for immunization: Secondary | ICD-10-CM | POA: Diagnosis not present

## 2019-08-28 ENCOUNTER — Other Ambulatory Visit: Payer: Self-pay | Admitting: Cardiovascular Disease

## 2019-08-28 NOTE — Telephone Encounter (Signed)
New Message    *STAT* If patient is at the pharmacy, call can be transferred to refill team.   1. Which medications need to be refilled? (please list name of each medication and dose if known) atorvastatin (LIPITOR) 40 MG tablet    2. Which pharmacy/location (including street and city if local pharmacy) is medication to be sent to?[facility] [place], [place] AT [address]   3. Do they need a 30 day or 90 day supply? David Frost

## 2019-08-29 ENCOUNTER — Other Ambulatory Visit: Payer: Self-pay | Admitting: Cardiovascular Disease

## 2019-08-29 MED ORDER — ATORVASTATIN CALCIUM 40 MG PO TABS: ORAL_TABLET | ORAL | 0 refills | Status: DC

## 2019-08-30 ENCOUNTER — Ambulatory Visit: Payer: Medicare Other | Admitting: Cardiovascular Disease

## 2019-09-06 ENCOUNTER — Telehealth (INDEPENDENT_AMBULATORY_CARE_PROVIDER_SITE_OTHER): Payer: Medicare Other | Admitting: Cardiovascular Disease

## 2019-09-06 ENCOUNTER — Encounter: Payer: Self-pay | Admitting: Cardiovascular Disease

## 2019-09-06 DIAGNOSIS — I2510 Atherosclerotic heart disease of native coronary artery without angina pectoris: Secondary | ICD-10-CM | POA: Diagnosis not present

## 2019-09-06 DIAGNOSIS — I10 Essential (primary) hypertension: Secondary | ICD-10-CM | POA: Diagnosis not present

## 2019-09-06 DIAGNOSIS — E785 Hyperlipidemia, unspecified: Secondary | ICD-10-CM | POA: Diagnosis not present

## 2019-09-06 DIAGNOSIS — I4891 Unspecified atrial fibrillation: Secondary | ICD-10-CM | POA: Diagnosis not present

## 2019-09-06 DIAGNOSIS — G4733 Obstructive sleep apnea (adult) (pediatric): Secondary | ICD-10-CM

## 2019-09-06 DIAGNOSIS — I214 Non-ST elevation (NSTEMI) myocardial infarction: Secondary | ICD-10-CM

## 2019-09-06 MED ORDER — NITROGLYCERIN 0.4 MG SL SUBL: 0.4000 mg | SUBLINGUAL_TABLET | SUBLINGUAL | 0 refills | Status: DC | PRN

## 2019-09-06 MED ORDER — ATORVASTATIN CALCIUM 40 MG PO TABS: 40.0000 mg | ORAL_TABLET | Freq: Every day | ORAL | 3 refills | Status: DC

## 2019-09-06 MED ORDER — METOPROLOL SUCCINATE ER 50 MG PO TB24: 50.0000 mg | ORAL_TABLET | Freq: Every day | ORAL | 2 refills | Status: DC

## 2019-09-06 NOTE — Progress Notes (Signed)
Virtual Visit via Video Note   This visit type was conducted due to national recommendations for restrictions regarding the COVID-19 Pandemic (e.g. social distancing) in an effort to limit this patient's exposure and mitigate transmission in our community.  Due to his co-morbid illnesses, this patient is at least at moderate risk for complications without adequate follow up.  This format is felt to be most appropriate for this patient at this time.  All issues noted in this document were discussed and addressed.  A limited physical exam was performed with this format.  Please refer to the patient's chart for his consent to telehealth for [facility].   Date:  09/06/2019   ID:  [name], DOB Jun 24, 1951, MRN [id number]  Patient Location: Home Provider Location: Home  PCP:  [name], MD  Cardiologist:  Quay Burow, MD  Electrophysiologist:  None   Evaluation Performed:  Follow-Up Visit  Chief Complaint: Coronary artery disease  History of Present Illness:    David Frost is a 68 y.o. male severely overweight divorced Caucasian male father of 2 sonsNicki Frost and David Frost)who I last saw  03/22/2018. He has a history of obesity, hypertension, chronic right bundle branch block and symptoms compatible sleep apnea. He hasobstructive sleep apnea on CPAP.He does admit to dietary indiscretion. He says that he loved bagels and salt. He had a Myoview stress test in 2007 which was low risk and a 2-D echo to and 10 that showed normal LV function. He had a non-STEMI in [place] 03/15/17 and was transferred to [facility] where he underwent cardiac catheterization revealing multivessel disease. He was then transferred to [facility] where he underwent coronary artery bypass grafting 6 by Dr. Servando Snare on 03/22/17. His postoperative course was uncomplicated. Soon after discharge she was remitted with A. fib with RVR from whichhewas symptomatic from and was rate  controlled, placed on amiodarone and oral anticoagulation.  On 05/04/17 he underwent outpatient DC cardioversion by Dr. Oval Linsey successfully to sinus rhythm with one shock. He has done well since.  Unfortunately, he has not been able to lose weight over the last year.  He has not exercised either he does have sleep apnea but does not use his CPAP.  He sleeps in a chair.  Since I saw him a year and a half ago he is remained stable.  He is sheltering in place and socially distancing.  He denies chest pain or shortness of breath.  He just converted one of his rooms to an exercise room and intends to start exercising.   The patient does not have symptoms concerning for COVID-19 infection (fever, chills, cough, or new shortness of breath).    Past Medical History:  Diagnosis Date  . Anxiety   . Coronary artery disease    a. s/p CABG in 03/2017 with LIMA-LAD, reverse SVG-intermediate, sequential reverse SVG-OM1-dLCx, and sequential reverse SVG-PDA-PLB  . Diabetes mellitus without complication ([place])    type 2  . Dietary indiscretion   . Dyspnea   . GERD (gastroesophageal reflux disease)   . Hypertension   . Obesity   . Obstructive sleep apnea   . PAF (paroxysmal atrial fibrillation) ([place])    a. s/p DCCV in 04/2017  . Right bundle branch block    Past Surgical History:  Procedure Laterality Date  . CARDIAC CATHETERIZATION    . CARDIOVERSION N/A 05/04/2017   Procedure: CARDIOVERSION;  Surgeon: Skeet Latch, MD;  Location: [place];  Service: Cardiovascular;  Laterality:  N/A;  . CORONARY ARTERY BYPASS GRAFT N/A 03/22/2017   Procedure: CORONARY ARTERY BYPASS GRAFTING (CABG) x6, using the left internal mammary artery and the greater saphenous vein harvested endoscopically from the right thigh and calf and the left thigh. -LIMA to LAD -SVG to RAMUS INTERMEDIATE -SEQ SVG to OM1 and DISTAL LEFT CIRCUMFLEX -SEQ SVG to PDA and PLVB;  Surgeon: [name], MD;  Location: [place];  Service:  Open Heart Surgery;  Laterality  . DOPPLER ECHOCARDIOGRAPHY  2010  . NM MYOVIEW LTD  2007  . TEE WITHOUT CARDIOVERSION N/A 03/22/2017   Procedure: TRANSESOPHAGEAL ECHOCARDIOGRAM (TEE);  Surgeon: [name], MD;  Location: [employer];  Service: Open Heart Surgery;  Laterality: N/A;  . TONSILLECTOMY       Current Meds  Medication Sig  . acetaminophen (TYLENOL) 325 MG tablet Take 2 tablets (650 mg total) by mouth every 6 (six) hours as needed for mild pain.  Marland Kitchen ALPRAZolam (XANAX) 0.25 MG tablet Take 1 tablet (0.25 mg total) by mouth 2 (two) times daily as needed for anxiety.  Marland Kitchen aspirin EC 81 MG tablet Take 1 tablet (81 mg total) by mouth daily.  Marland Kitchen atorvastatin (LIPITOR) 40 MG tablet TAKE 1 TABLET(40 MG) BY MOUTH DAILY AT 6 PM  . brimonidine-timolol (COMBIGAN) 0.2-0.5 % ophthalmic solution Place 1 drop into both eyes 2 (two) times daily.   . Canagliflozin (INVOKANA) 300 MG TABS Take 300 mg by mouth daily.  Marland Kitchen loratadine (CLARITIN) 10 MG tablet Take 10 mg by mouth daily.  . metoprolol succinate (TOPROL-XL) 50 MG 24 hr tablet Take 1 tablet (50 mg total) by mouth daily.  . metoprolol succinate (TOPROL-XL) 50 MG 24 hr tablet TAKE 1 TABLET(50 MG) BY MOUTH TWICE DAILY  . nitroGLYCERIN (NITROSTAT) 0.4 MG SL tablet PLACE 1 TABLET UNDER THE TONGUE EVERY 5 MINUTES AS NEEDED FOR CHEST PAIN  . omeprazole (PRILOSEC) 40 MG capsule TAKE 1 CAPSULE(40 MG) BY MOUTH DAILY  . Polyethylene Glycol 3350 (MIRALAX PO) Take 1 Dose by mouth every other day.   Marland Kitchen PROAIR RESPICLICK 123XX123 (90 Base) MCG/ACT AEPB Take 2 puffs by mouth as directed.     Allergies:   Patient has no known allergies.   Social History   Tobacco Use  . Smoking status: Former Smoker    Years: 2.00    Types: Cigars    Quit date: 03/08/2017    Years since quitting: 2.4  . Smokeless tobacco: Never Used  Substance Use Topics  . Alcohol use: No    Alcohol/week: 70.0 standard drinks    Types: 70 Cans of beer per week    Comment: pt states not  drinking since CABG  . Drug use: No     Family Hx: The patient's family history includes Heart attack in his father.  ROS:   Please see the history of present illness.     All other systems reviewed and are negative.   Prior CV studies:   The following studies were reviewed today:  None  Labs/Other Tests and Data Reviewed:    EKG:  No ECG reviewed.  Recent Labs: No results found for requested labs within last 8760 hours.   Recent Lipid Panel Lab Results  Component Value Date/Time   CHOL 169 03/23/2018 08:10 AM   TRIG 135 03/23/2018 08:10 AM   HDL 47 03/23/2018 08:10 AM   CHOLHDL 3.6 03/23/2018 08:10 AM   CHOLHDL 6.6 03/18/2017 05:03 AM   LDLCALC 95 03/23/2018 08:10 AM  Wt Readings from Last 3 Encounters:  09/06/19 298 lb (135.2 kg)  03/22/18 (!) 307 lb (139.3 kg)  11/25/17 (!) 307 lb 9.6 oz (139.5 kg)     Objective:    Vital Signs:  BP 130/78   Pulse 80   Ht 6\' 2"  (1.88 m)   Wt 298 lb (135.2 kg)   BMI 38.26 kg/m    VITAL SIGNS:  reviewed GEN:  no acute distress RESPIRATORY:  normal respiratory effort, symmetric expansion CARDIOVASCULAR:  no peripheral edema MUSCULOSKELETAL:  no obvious deformities. NEURO:  alert and oriented x 3, no obvious focal deficit PSYCH:  normal affect  ASSESSMENT & PLAN:    1. Coronary artery disease-history of CAD status post CABG by Dr. Servando Snare 03/22/2017 after a non-STEMI 03/15/2017 and cath that showed multivessel disease.  He is asymptomatic 2. Essential hypertension-blood pressure measured today by patient at home was 130/78 with a pulse of 80.  He is on metoprolol 3. Hyperlipidemia-history of hyperlipidemia on statin therapy with lipid profile performed 03/23/2018 showing total cholesterol 169, LDL of 95 and HDL of 47 4. Morbid obesity-weight has not changed 5. Obstructive sleep apnea-history of obstructive sleep apnea although the patient does not wear his CPAP.  He sleeps in the chair at night.  COVID-19  Education: The signs and symptoms of COVID-19 were discussed with the patient and how to seek care for testing (follow up with PCP or arrange E-visit).  The importance of social distancing was discussed today.  Time:   Today, I have spent 5 minutes with the patient with telehealth technology discussing the above problems.     Medication Adjustments/Labs and Tests Ordered: Current medicines are reviewed at length with the patient today.  Concerns regarding medicines are outlined above.   Tests Ordered: No orders of the defined types were placed in this encounter.   Medication Changes: No orders of the defined types were placed in this encounter.   Follow Up:  In Person in 1 year(s)  Signed, Quay Burow, MD  09/06/2019 9:19 AM    [place]

## 2019-09-06 NOTE — Patient Instructions (Signed)
Medication Instructions:  Your physician recommends that you continue on your current medications as directed. Please refer to the Current Medication list given to you today.  If you need a refill on your cardiac medications before your next appointment, please call your pharmacy.   Lab work: NONE  Testing/Procedures: NONE  Follow-Up: At [facility], you and your health needs are our priority.  As part of our continuing mission to provide you with exceptional heart care, we have created designated Provider Care Teams.  These Care Teams include your primary Cardiologist (physician) and Advanced Practice Providers (APPs -  Physician Assistants and Nurse Practitioners) who all work together to provide you with the care you need, when you need it. You may see Jonathan Berry, MD or one of the following Advanced Practice Providers on your designated Care Team:    Luke Kilroy, PA-C  Callie Goodrich, PA-C  Jesse Cleaver, FNP Your physician wants you to follow-up in 1 year        

## 2019-09-06 NOTE — Addendum Note (Signed)
Addended by: Cain Sieve on: 09/06/2019 10:17 AM   Modules accepted: Orders

## 2019-10-03 ENCOUNTER — Telehealth: Payer: Self-pay | Admitting: Cardiovascular Disease

## 2019-10-03 NOTE — Telephone Encounter (Signed)
Pt advised to go on the [facility] site possibly through his My Chart and the [facility] website to determine what group he will qualify for the COVID vaccine and to watch daily for the information to update for when he is eligible.

## 2019-10-03 NOTE — Telephone Encounter (Signed)
New Message:   Pt wants to know since he have heart problems if he can gert the vaccine now, even though he is not 73. He said he heard that if you had issues you could get the shot sooner.

## 2019-10-04 DIAGNOSIS — E785 Hyperlipidemia, unspecified: Secondary | ICD-10-CM

## 2019-10-06 ENCOUNTER — Telehealth: Payer: Self-pay | Admitting: Cardiovascular Disease

## 2019-10-06 NOTE — Telephone Encounter (Signed)
Called pt and clarified him being referred to Dr. Debara Pickett for high triglycerides. Made sure pt was aware that Dr. Gwenlyn Found was still his primary cardiologist. Verbalized understanding.

## 2019-10-06 NOTE — Telephone Encounter (Signed)
Patient states he does not know why he has an appointment with Dr. Debara Pickett. I stated that Dr. Gwenlyn Found sent over a referral for him to see Dr. Debara Pickett for hyperlipidemia. He wants some more clarification on this appointment because he states he did not make this appointment nor does he want to see another doctor.

## 2019-10-20 ENCOUNTER — Ambulatory Visit: Payer: Medicare Other

## 2019-10-23 DIAGNOSIS — H35371 Puckering of macula, right eye: Secondary | ICD-10-CM | POA: Diagnosis not present

## 2019-10-23 DIAGNOSIS — H04123 Dry eye syndrome of bilateral lacrimal glands: Secondary | ICD-10-CM | POA: Diagnosis not present

## 2019-10-23 DIAGNOSIS — H2513 Age-related nuclear cataract, bilateral: Secondary | ICD-10-CM | POA: Diagnosis not present

## 2019-10-23 DIAGNOSIS — H353131 Nonexudative age-related macular degeneration, bilateral, early dry stage: Secondary | ICD-10-CM | POA: Diagnosis not present

## 2019-10-23 DIAGNOSIS — H40053 Ocular hypertension, bilateral: Secondary | ICD-10-CM | POA: Diagnosis not present

## 2019-10-23 DIAGNOSIS — E119 Type 2 diabetes mellitus without complications: Secondary | ICD-10-CM | POA: Diagnosis not present

## 2019-10-25 ENCOUNTER — Ambulatory Visit: Payer: Medicare Other

## 2019-10-26 DIAGNOSIS — Z23 Encounter for immunization: Secondary | ICD-10-CM | POA: Diagnosis not present

## 2019-10-28 ENCOUNTER — Ambulatory Visit: Payer: Medicare Other

## 2019-10-30 ENCOUNTER — Telehealth: Payer: Self-pay | Admitting: Internal Medicine

## 2019-10-30 ENCOUNTER — Ambulatory Visit: Payer: Medicare Other

## 2019-10-30 NOTE — Telephone Encounter (Signed)
Patient calling to request his appointment 2/10 be virtual.

## 2019-10-30 NOTE — Telephone Encounter (Signed)
Appointment type changed per patient request d/t COVID. Explained process and expectations. Voiced understanding.

## 2019-10-31 DIAGNOSIS — H35033 Hypertensive retinopathy, bilateral: Secondary | ICD-10-CM | POA: Diagnosis not present

## 2019-10-31 DIAGNOSIS — H353111 Nonexudative age-related macular degeneration, right eye, early dry stage: Secondary | ICD-10-CM | POA: Diagnosis not present

## 2019-10-31 DIAGNOSIS — H35373 Puckering of macula, bilateral: Secondary | ICD-10-CM | POA: Diagnosis not present

## 2019-10-31 DIAGNOSIS — H353122 Nonexudative age-related macular degeneration, left eye, intermediate dry stage: Secondary | ICD-10-CM | POA: Diagnosis not present

## 2019-11-01 ENCOUNTER — Encounter: Payer: Self-pay | Admitting: Internal Medicine

## 2019-11-01 ENCOUNTER — Telehealth (INDEPENDENT_AMBULATORY_CARE_PROVIDER_SITE_OTHER): Payer: Medicare Other | Admitting: Internal Medicine

## 2019-11-01 VITALS — BP 133/70 | HR 77 | Ht 74.0 in | Wt 300.0 lb

## 2019-11-01 DIAGNOSIS — E785 Hyperlipidemia, unspecified: Secondary | ICD-10-CM

## 2019-11-01 DIAGNOSIS — E782 Mixed hyperlipidemia: Secondary | ICD-10-CM | POA: Diagnosis not present

## 2019-11-01 DIAGNOSIS — I2510 Atherosclerotic heart disease of native coronary artery without angina pectoris: Secondary | ICD-10-CM | POA: Diagnosis not present

## 2019-11-01 NOTE — Progress Notes (Signed)
Virtual Visit via Video Note   This visit type was conducted due to national recommendations for restrictions regarding the COVID-19 Pandemic (e.g. social distancing) in an effort to limit this patient's exposure and mitigate transmission in our community.  Due to his co-morbid illnesses, this patient is at least at moderate risk for complications without adequate follow up.  This format is felt to be most appropriate for this patient at this time.  All issues noted in this document were discussed and addressed.  A limited physical exam was performed with this format.  Please refer to the patient's chart for his consent to telehealth for [facility]   Evaluation Performed:  Doxy.me video visit  Date:  11/01/2019   ID:  David Frost, DOB May 27, 1951, MRN [id number]  Patient Location:  [address] [place] [address]  Provider location:   [address] [place], [place] [address]  PCP:  [name], MD  Cardiologist:  Quay Burow, MD Electrophysiologist:  None   Chief Complaint:  No complaints  History of Present Illness:    David Frost is a 69 y.o. male who presents via audio/video conferencing for a telehealth visit today.  Mr. [name] is seen today via virtual visit.  He is currently referred by Dr. Gwenlyn Found for management of dyslipidemia.  Most recently he had labs in August 2020 by his primary care physician.  This demonstrated total cholesterol 165, triglycerides 456, HDL 37 and LDL 37.  He does not believe this was a fasting study.  He does have a history of bypass surgery and in the past after his surgery he noted that he was very strict on his diet and at one point his triglycerides were 135.  Since then he says he is "slacked up" on his diet and exercise.  Over the past 6 months however he converted to bedroom in his house to a gym.  He purchased an exercise bike and has been over hauling his diet.  He thinks his numbers are much improved.  We  talked about the significance of elevated triglycerides according to their increased residual risk.  Ultimately, with his coronary history, even with significant improvement in his triglycerides if they remain elevated despite exercise and dietary modifications, he is a good candidate for additional therapy.  The patient does not have symptoms concerning for COVID-19 infection (fever, chills, cough, or new SHORTNESS OF BREATH).    Prior CV studies:   The following studies were reviewed today:  Deferred  PMHx:  Past Medical History:  Diagnosis Date  . Anxiety   . Coronary artery disease    a. s/p CABG in 03/2017 with LIMA-LAD, reverse SVG-intermediate, sequential reverse SVG-OM1-dLCx, and sequential reverse SVG-PDA-PLB  . Diabetes mellitus without complication ([place])    type 2  . Dietary indiscretion   . Dyspnea   . GERD (gastroesophageal reflux disease)   . Hypertension   . Obesity   . Obstructive sleep apnea   . PAF (paroxysmal atrial fibrillation) ([place])    a. s/p DCCV in 04/2017  . Right bundle branch block     Past Surgical History:  Procedure Laterality Date  . CARDIAC CATHETERIZATION    . CARDIOVERSION N/A 05/04/2017   Procedure: CARDIOVERSION;  Surgeon: Skeet Latch, MD;  Location: [place];  Service: Cardiovascular;  Laterality: N/A;  . CORONARY ARTERY BYPASS GRAFT N/A 03/22/2017   Procedure: CORONARY ARTERY BYPASS GRAFTING (CABG) x6, using the left internal mammary artery and the greater saphenous vein  harvested endoscopically from the right thigh and calf and the left thigh. -LIMA to LAD -SVG to RAMUS INTERMEDIATE -SEQ SVG to OM1 and DISTAL LEFT CIRCUMFLEX -SEQ SVG to PDA and PLVB;  Surgeon: [name], MD;  Location: [place];  Service: Open Heart Surgery;  Laterality  . DOPPLER ECHOCARDIOGRAPHY  2010  . NM MYOVIEW LTD  2007  . TEE WITHOUT CARDIOVERSION N/A 03/22/2017   Procedure: TRANSESOPHAGEAL ECHOCARDIOGRAM (TEE);  Surgeon: [name], MD;   Location: [place];  Service: Open Heart Surgery;  Laterality: N/A;  . TONSILLECTOMY      FAMHx:  Family History  Problem Relation Age of Onset  . Heart attack Father     SOCHx:   reports that he quit smoking about 2 years ago. His smoking use included cigars. He quit after 2.00 years of use. He has never used smokeless tobacco. He reports that he does not drink alcohol or use drugs.  ALLERGIES:  No Known Allergies  MEDS:  Current Meds  Medication Sig  . acetaminophen (TYLENOL) 325 MG tablet Take 2 tablets (650 mg total) by mouth every 6 (six) hours as needed for mild pain.  Marland Kitchen ALPRAZolam (XANAX) 0.25 MG tablet Take 1 tablet (0.25 mg total) by mouth 2 (two) times daily as needed for anxiety.  Marland Kitchen aspirin EC 81 MG tablet Take 1 tablet (81 mg total) by mouth daily.  Marland Kitchen atorvastatin (LIPITOR) 40 MG tablet Take 1 tablet (40 mg total) by mouth daily.  . brimonidine-timolol (COMBIGAN) 0.2-0.5 % ophthalmic solution Place 1 drop into both eyes 2 (two) times daily.   . Canagliflozin (INVOKANA) 300 MG TABS Take 300 mg by mouth daily.  Marland Kitchen loratadine (CLARITIN) 10 MG tablet Take 10 mg by mouth daily.  . metoprolol succinate (TOPROL-XL) 50 MG 24 hr tablet Take 1 tablet (50 mg total) by mouth daily.  . Multiple Vitamins-Minerals (PRESERVISION AREDS 2 PO) Take 1 capsule by mouth 2 (two) times daily.  . nitroGLYCERIN (NITROSTAT) 0.4 MG SL tablet Place 1 tablet (0.4 mg total) under the tongue every 5 (five) minutes as needed for chest pain.  Marland Kitchen PROAIR RESPICLICK 123XX123 (90 Base) MCG/ACT AEPB Take 2 puffs by mouth as directed.  . [DISCONTINUED] omeprazole (PRILOSEC) 40 MG capsule TAKE 1 CAPSULE(40 MG) BY MOUTH DAILY     ROS: Pertinent items noted in HPI and remainder of comprehensive ROS otherwise negative.  Labs/Other Tests and Data Reviewed:    Recent Labs: No results found for requested labs within last 8760 hours.   Recent Lipid Panel Lab Results  Component Value Date/Time   CHOL 169 03/23/2018  08:10 AM   TRIG 135 03/23/2018 08:10 AM   HDL 47 03/23/2018 08:10 AM   CHOLHDL 3.6 03/23/2018 08:10 AM   CHOLHDL 6.6 03/18/2017 05:03 AM   LDLCALC 95 03/23/2018 08:10 AM    Wt Readings from Last 3 Encounters:  11/01/19 300 lb (136.1 kg)  09/06/19 298 lb (135.2 kg)  03/22/18 (!) 307 lb (139.3 kg)     Exam:    Vital Signs:  BP 133/70   Pulse 77   Ht 6\' 2"  (1.88 m)   Wt 300 lb (136.1 kg)   SpO2 97%   BMI 38.52 kg/m    General appearance: alert and no distress Lungs: no audible respiratory difficulty Abdomen: obese Extremities: extremities normal, atraumatic, no cyanosis or edema Skin: Skin color, texture, turgor normal. No rashes or lesions Neurologic: Mental status: Alert, oriented, thought content appropriate Psych: Pleasant  ASSESSMENT &  PLAN:    1. Dyslipidemia with high triglycerides, LDL goal less than 70 2. Coronary artery disease status post CABG 3. Type 2 diabetes-controlled 4. Essential hypertension 5. Morbid obesity 6. OSA-noncompliant with CPAP  Mr. Hemp has recently overhauled his exercise and diet.  He is working to try to lower his triglycerides which are markedly elevated.  This was a nonfasting study however typically this is only about 30% higher.  I advised we repeat his lipids as has been more than 6 months and he is made significant dietary changes and exercise over the past 2 those.  We will get a fasting study.  Based on his findings, I am still likely to recommend treatment.  He is a very good candidate for Vascepa 2 g twice daily given his diabetes, prior coronary disease and elevated triglycerides based on trial data from reduce it.  Thanks again for the kind referral.  COVID-19 Education: The signs and symptoms of COVID-19 were discussed with the patient and how to seek care for testing (follow up with PCP or arrange E-visit).  The importance of social distancing was discussed today.  Patient Risk:   After full review of this patients  clinical status, I feel that they are at least moderate risk at this time.  Time:   Today, I have spent 25 minutes with the patient with telehealth technology discussing dyslipidemia, coronary disease, elevated triglycerides, type 2 diabetes.     Medication Adjustments/Labs and Tests Ordered: Current medicines are reviewed at length with the patient today.  Concerns regarding medicines are outlined above.   Tests Ordered: Orders Placed This Encounter  Procedures  . Lipid panel    Medication Changes: No orders of the defined types were placed in this encounter.   Disposition:  in 4 month(s)  Pixie Casino, MD, [facility], [place] Director of the Advanced Lipid Disorders &  Cardiovascular Risk Reduction Clinic Diplomate of the American Board of Clinical Lipidology Attending Cardiologist  Direct Dial: [phone]  Fax: [phone]  Website:  [email or web address]  Pixie Casino, MD  11/01/2019 10:24 AM

## 2019-11-01 NOTE — Patient Instructions (Signed)
Medication Instructions:  Your physician recommends that you continue on your current medications as directed. Please refer to the Current Medication list given to you today.  Any changes will depend on your lab results  *If you need a refill on your cardiac medications before your next appointment, please call your pharmacy*  Lab Work: FASTING lab work to check cholesterol   If you have labs (blood work) drawn today and your tests are completely normal, you will receive your results only by: Marland Kitchen MyChart Message (if you have MyChart) OR . A paper copy in the mail If you have any lab test that is abnormal or we need to change your treatment, we will call you to review the results.  Testing/Procedures: NONE  Follow-Up: At [facility], you and your health needs are our priority.  As part of our continuing mission to provide you with exceptional heart care, we have created designated Provider Care Teams.  These Care Teams include your primary Cardiologist (physician) and Advanced Practice Providers (APPs -  Physician Assistants and Nurse Practitioners) who all work together to provide you with the care you need, when you need it.  Your next appointment:   3-4 months - lipid clinic  The format for your next appointment:   Either In Person or Virtual  Provider:   K. Mali Hilty, MD  Other Instructions

## 2019-11-13 DIAGNOSIS — E785 Hyperlipidemia, unspecified: Secondary | ICD-10-CM | POA: Diagnosis not present

## 2019-11-13 LAB — LIPID PANEL
Chol/HDL Ratio: 3.9 ratio (ref 0.0–5.0)
Cholesterol, Total: 171 mg/dL (ref 100–199)
HDL: 44 mg/dL (ref >39)
LDL Chol Calc (NIH): 58 mg/dL (ref 0–99)
Triglycerides: 456 mg/dL — ABNORMAL HIGH (ref 0–149)
VLDL Cholesterol Cal: 69 mg/dL — ABNORMAL HIGH (ref 5–40)

## 2019-11-20 ENCOUNTER — Telehealth: Payer: Self-pay

## 2019-11-20 DIAGNOSIS — H353131 Nonexudative age-related macular degeneration, bilateral, early dry stage: Secondary | ICD-10-CM | POA: Diagnosis not present

## 2019-11-20 DIAGNOSIS — H25043 Posterior subcapsular polar age-related cataract, bilateral: Secondary | ICD-10-CM | POA: Diagnosis not present

## 2019-11-20 DIAGNOSIS — H2513 Age-related nuclear cataract, bilateral: Secondary | ICD-10-CM | POA: Diagnosis not present

## 2019-11-20 DIAGNOSIS — H2511 Age-related nuclear cataract, right eye: Secondary | ICD-10-CM | POA: Diagnosis not present

## 2019-11-20 DIAGNOSIS — H25013 Cortical age-related cataract, bilateral: Secondary | ICD-10-CM | POA: Diagnosis not present

## 2019-11-20 DIAGNOSIS — H401131 Primary open-angle glaucoma, bilateral, mild stage: Secondary | ICD-10-CM | POA: Diagnosis not present

## 2019-11-20 NOTE — Telephone Encounter (Signed)
   Primary Cardiologist: Quay Burow, MD  Chart reviewed as part of pre-operative protocol coverage. Cataract extractions are recognized in guidelines as low risk surgeries that do not typically require specific preoperative testing or holding of blood thinner therapy. Therefore, given past medical history and time since last visit, based on ACC/AHA guidelines, David Frost would be at acceptable risk for the planned procedure without further cardiovascular testing.   I will route this recommendation to the requesting party via Epic fax function and remove from pre-op pool.  Please call with questions.  [place], [place] 11/20/2019, 4:52 PM

## 2019-11-20 NOTE — Telephone Encounter (Signed)
   [facility] Pre-operative Risk Assessment    Request for surgical clearance:  1. What type of surgery is being performed? CATARACT SURGERY   2. When is this surgery scheduled? OD 11-28-19    OS 12-05-19   3. What type of clearance is required (medical clearance vs. Pharmacy clearance to hold med vs. Both)? MEDICAL  4. Are there any medications that need to be held prior to surgery and how long? NO MEDICATIONS   5. Practice name and name of physician performing surgery?   [facility]  DR Vevelyn Royals  6. What is your office phone number [phone]    7.   What is your office fax number  [phone]  8.   Anesthesia type (None, local, MAC, general) ? TOPICAL W/IV MEDICATION    (provider comments below)

## 2019-11-22 DIAGNOSIS — Z23 Encounter for immunization: Secondary | ICD-10-CM | POA: Diagnosis not present

## 2019-11-28 DIAGNOSIS — H25042 Posterior subcapsular polar age-related cataract, left eye: Secondary | ICD-10-CM | POA: Diagnosis not present

## 2019-11-28 DIAGNOSIS — H25811 Combined forms of age-related cataract, right eye: Secondary | ICD-10-CM | POA: Diagnosis not present

## 2019-11-28 DIAGNOSIS — H25012 Cortical age-related cataract, left eye: Secondary | ICD-10-CM | POA: Diagnosis not present

## 2019-11-28 DIAGNOSIS — H2512 Age-related nuclear cataract, left eye: Secondary | ICD-10-CM | POA: Diagnosis not present

## 2019-11-28 DIAGNOSIS — H2511 Age-related nuclear cataract, right eye: Secondary | ICD-10-CM | POA: Diagnosis not present

## 2019-12-05 HISTORY — PX: CATARACT EXTRACTION: SUR2

## 2020-01-03 ENCOUNTER — Other Ambulatory Visit: Payer: Self-pay | Admitting: Internal Medicine

## 2020-01-03 DIAGNOSIS — E785 Hyperlipidemia, unspecified: Secondary | ICD-10-CM

## 2020-01-30 DIAGNOSIS — E785 Hyperlipidemia, unspecified: Secondary | ICD-10-CM | POA: Diagnosis not present

## 2020-01-30 LAB — LIPID PANEL
Chol/HDL Ratio: 3.4 ratio (ref 0.0–5.0)
Cholesterol, Total: 143 mg/dL (ref 100–199)
HDL: 42 mg/dL (ref >39)
LDL Chol Calc (NIH): 49 mg/dL (ref 0–99)
Triglycerides: 348 mg/dL — ABNORMAL HIGH (ref 0–149)
VLDL Cholesterol Cal: 52 mg/dL — ABNORMAL HIGH (ref 5–40)

## 2020-02-08 ENCOUNTER — Other Ambulatory Visit: Payer: Self-pay

## 2020-02-08 ENCOUNTER — Ambulatory Visit (INDEPENDENT_AMBULATORY_CARE_PROVIDER_SITE_OTHER): Payer: Medicare Other | Admitting: Internal Medicine

## 2020-02-08 ENCOUNTER — Encounter: Payer: Self-pay | Admitting: Internal Medicine

## 2020-02-08 VITALS — BP 130/71 | HR 87 | Temp 97.2°F | Ht 73.0 in | Wt 300.0 lb

## 2020-02-08 DIAGNOSIS — I2510 Atherosclerotic heart disease of native coronary artery without angina pectoris: Secondary | ICD-10-CM | POA: Diagnosis not present

## 2020-02-08 DIAGNOSIS — E785 Hyperlipidemia, unspecified: Secondary | ICD-10-CM | POA: Diagnosis not present

## 2020-02-08 DIAGNOSIS — F1010 Alcohol abuse, uncomplicated: Secondary | ICD-10-CM

## 2020-02-08 DIAGNOSIS — E782 Mixed hyperlipidemia: Secondary | ICD-10-CM | POA: Diagnosis not present

## 2020-02-08 MED ORDER — ICOSAPENT ETHYL 1 G PO CAPS: 2.0000 g | ORAL_CAPSULE | Freq: Two times a day (BID) | ORAL | 11 refills | Status: DC

## 2020-02-08 NOTE — Patient Instructions (Signed)
Medication Instructions:  Your physician has recommended you make the following change in your medication:  -- START vascepa 2 gram twice daily (2 capsules in AM, 2 capsules in PM)  *If you need a refill on your cardiac medications before your next appointment, please call your pharmacy*   Lab Work: FASTING lab work in 3 months to check cholesterol   If you have labs (blood work) drawn today and your tests are completely normal, you will receive your results only by: Marland Kitchen MyChart Message (if you have MyChart) OR . A paper copy in the mail If you have any lab test that is abnormal or we need to change your treatment, we will call you to review the results.   Testing/Procedures: NONE   Follow-Up: At [facility], you and your health needs are our priority.  As part of our continuing mission to provide you with exceptional heart care, we have created designated Provider Care Teams.  These Care Teams include your primary Cardiologist (physician) and Advanced Practice Providers (APPs -  Physician Assistants and Nurse Practitioners) who all work together to provide you with the care you need, when you need it.  We recommend signing up for the patient portal called "MyChart".  Sign up information is provided on this After Visit Summary.  MyChart is used to connect with patients for Virtual Visits (Telemedicine).  Patients are able to view lab/test results, encounter notes, upcoming appointments, etc.  Non-urgent messages can be sent to your provider as well.   To learn more about what you can do with MyChart, go to [email or web address].    Your next appointment:   3 month(s)  The format for your next appointment:   Either In Person or Virtual  Provider:   Raliegh Ip Mali Hilty, MD   Other Instructions

## 2020-02-08 NOTE — Progress Notes (Signed)
LIPID CLINIC CONSULT NOTE  Chief Complaint: Manage dyslipidemia  Primary Care Physician: David Battles, MD  Primary Cardiologist:  David Burow, MD  HPI:  David Frost is a 69 y.o. male who is being seen today for the evaluation of dyslipidemia at the request of Dr. Gwenlyn Frost. [name] is seen today via virtual visit.  He is currently referred by Dr. Gwenlyn Frost for management of dyslipidemia.  Most recently he had labs in August 2020 by his primary care physician.  This demonstrated total cholesterol 165, triglycerides 456, HDL 37 and LDL 37.  He does not believe this was a fasting study.  He does have a history of bypass surgery and in the past after his surgery he noted that he was very strict on his diet and at one point his triglycerides were 135.  Since then he says he is "slacked up" on his diet and exercise.  Over the past 6 months however he converted to bedroom in his house to a gym.  He purchased an exercise bike and has been over hauling his diet.  He thinks his numbers are much improved.  We talked about the significance of elevated triglycerides according to their increased residual risk.  Ultimately, with his coronary history, even with significant improvement in his triglycerides if they remain elevated despite exercise and dietary modifications, he is a good candidate for additional therapy.  02/08/2020  [name] returns today for follow-up. He had repeat lipids which do show some improvement. We have plan to try dietary interventions, weight loss and decrease in alcohol to see if it improves his triglycerides. His lipids are somewhat better although his triglycerides remain elevated. Total cholesterol is 143, triglycerides 348 (reduced from 456), HDL 42 and LDL of 49. He does report some weight loss and he has been working on glycemic control. His blood sugars are also improved therefore his triglycerides are primarily related to weight, inactivity and alcohol use. He says  he generally drinks about a bottle of wine per day.  PMHx:  Past Medical History:  Diagnosis Date  . Anxiety   . Coronary artery disease    a. s/p CABG in 03/2017 with LIMA-LAD, reverse SVG-intermediate, sequential reverse SVG-OM1-dLCx, and sequential reverse SVG-PDA-PLB  . Diabetes mellitus without complication ([place])    type 2  . Dietary indiscretion   . Dyspnea   . GERD (gastroesophageal reflux disease)   . Hypertension   . Obesity   . Obstructive sleep apnea   . PAF (paroxysmal atrial fibrillation) ([place])    a. s/p DCCV in 04/2017  . Right bundle branch block     Past Surgical History:  Procedure Laterality Date  . CARDIAC CATHETERIZATION    . CARDIOVERSION N/A 05/04/2017   Procedure: CARDIOVERSION;  Surgeon: David Latch, MD;  Location: [place];  Service: Cardiovascular;  Laterality: N/A;  . CORONARY ARTERY BYPASS GRAFT N/A 03/22/2017   Procedure: CORONARY ARTERY BYPASS GRAFTING (CABG) x6, using the left internal mammary artery and the greater saphenous vein harvested endoscopically from the right thigh and calf and the left thigh. -LIMA to LAD -SVG to RAMUS INTERMEDIATE -SEQ SVG to OM1 and DISTAL LEFT CIRCUMFLEX -SEQ SVG to PDA and PLVB;  Surgeon: David Isaac, MD;  Location: [place];  Service: Open Heart Surgery;  Laterality  . DOPPLER ECHOCARDIOGRAPHY  2010  . NM MYOVIEW LTD  2007  . TEE WITHOUT CARDIOVERSION N/A 03/22/2017   Procedure: TRANSESOPHAGEAL ECHOCARDIOGRAM (TEE);  Surgeon: David Isaac, MD;  [facility];  Service: Open Heart Surgery;  Laterality: N/A;  . TONSILLECTOMY      FAMHx:  Family History  Problem Relation Age of Onset  . Heart attack Father     SOCHx:   reports that he quit smoking about 2 years ago. His smoking use included cigars. He quit after 2.00 years of use. He has never used smokeless tobacco. He reports that he does not drink alcohol or use drugs.  ALLERGIES:  No Known Allergies  ROS: Pertinent items noted in HPI  and remainder of comprehensive ROS otherwise negative.  HOME MEDS: Current Outpatient Medications on File Prior to Visit  Medication Sig Dispense Refill  . acetaminophen (TYLENOL) 325 MG tablet Take 2 tablets (650 mg total) by mouth every 6 (six) hours as needed for mild pain.    David Frost ALPRAZolam (XANAX) 0.25 MG tablet Take 1 tablet (0.25 mg total) by mouth 2 (two) times daily as needed for anxiety. 14 tablet 0  . aspirin EC 81 MG tablet Take 1 tablet (81 mg total) by mouth daily. 90 tablet 3  . atorvastatin (LIPITOR) 40 MG tablet Take 1 tablet (40 mg total) by mouth daily. 90 tablet 3  . brimonidine-timolol (COMBIGAN) 0.2-0.5 % ophthalmic solution Place 1 drop into both eyes 2 (two) times daily.     . Canagliflozin (INVOKANA) 300 MG TABS Take 300 mg by mouth daily.    David Frost loratadine (CLARITIN) 10 MG tablet Take 10 mg by mouth daily.    . metoprolol succinate (TOPROL-XL) 50 MG 24 hr tablet Take 1 tablet (50 mg total) by mouth daily. 60 tablet 2  . Multiple Vitamins-Minerals (PRESERVISION AREDS 2 PO) Take 1 capsule by mouth 2 (two) times daily.    . nitroGLYCERIN (NITROSTAT) 0.4 MG SL tablet Place 1 tablet (0.4 mg total) under the tongue every 5 (five) minutes as needed for chest pain. 25 tablet 0  . PROAIR RESPICLICK 123XX123 (90 Base) MCG/ACT AEPB Take 2 puffs by mouth as directed.  1   No current facility-administered medications on file prior to visit.    LABS/IMAGING: No results Frost for this or any previous visit (from the past 48 hour(s)). No results Frost.  LIPID PANEL:    Component Value Date/Time   CHOL 143 01/30/2020 0817   TRIG 348 (H) 01/30/2020 0817   HDL 42 01/30/2020 0817   CHOLHDL 3.4 01/30/2020 0817   CHOLHDL 6.6 03/18/2017 0503   VLDL 66 (H) 03/18/2017 0503   LDLCALC 49 01/30/2020 0817    WEIGHTS: Wt Readings from Last 3 Encounters:  02/08/20 300 lb (136.1 kg)  11/01/19 300 lb (136.1 kg)  09/06/19 298 lb (135.2 kg)    VITALS: BP 130/71   Pulse 87   Temp (!) 97.2  F (36.2 C)   Ht 6\' 1"  (1.854 m)   Wt 300 lb (136.1 kg)   SpO2 95%   BMI 39.58 kg/m   EXAM: Deferred  EKG: Deferred  ASSESSMENT: 1. Dyslipidemia with high triglycerides, LDL goal less than 70 2. Coronary artery disease status post CABG 3. Type 2 diabetes-controlled 4. Essential hypertension 5. Morbid obesity 6. OSA-noncompliant with CPAP 7. Alcohol abuse  PLAN: 1.   Mr. Bachand continues to have elevated triglycerides I think primarily related to weight, lack of activity and heavy alcohol use. I have highly encouraged decreasing his alcohol intake significantly if not quitting which will overall help his general medical condition. As this has been difficult to accomplish, in the short-term I think  he would benefit from adding Vascepa 2 g twice daily for better control of his triglycerides and cardiovascular risk reduction. He will also work diligently on his diet. Follow-up with me with repeat lipids in 3 to 4 months.  Pixie Casino, MD, [facility], [place] Director of the Advanced Lipid Disorders &  Cardiovascular Risk Reduction Clinic Diplomate of the American Board of Clinical Lipidology Attending Cardiologist  Direct Dial: [phone]  Fax: [phone]  Website:  [email or web address] 02/08/2020, 8:38 AM

## 2020-02-15 DIAGNOSIS — I998 Other disorder of circulatory system: Secondary | ICD-10-CM | POA: Diagnosis not present

## 2020-02-15 DIAGNOSIS — M1712 Unilateral primary osteoarthritis, left knee: Secondary | ICD-10-CM | POA: Diagnosis not present

## 2020-02-24 ENCOUNTER — Other Ambulatory Visit: Payer: Self-pay | Admitting: Cardiovascular Disease

## 2020-02-26 NOTE — Telephone Encounter (Signed)
Rx request sent to pharmacy.  

## 2020-03-12 ENCOUNTER — Ambulatory Visit (INDEPENDENT_AMBULATORY_CARE_PROVIDER_SITE_OTHER): Payer: Medicare Other | Admitting: Cardiovascular Disease

## 2020-03-12 ENCOUNTER — Encounter: Payer: Self-pay | Admitting: Cardiovascular Disease

## 2020-03-12 ENCOUNTER — Other Ambulatory Visit: Payer: Self-pay

## 2020-03-12 VITALS — BP 106/70 | HR 79 | Ht 74.0 in | Wt 304.8 lb

## 2020-03-12 DIAGNOSIS — I10 Essential (primary) hypertension: Secondary | ICD-10-CM

## 2020-03-12 DIAGNOSIS — I214 Non-ST elevation (NSTEMI) myocardial infarction: Secondary | ICD-10-CM

## 2020-03-12 DIAGNOSIS — G4733 Obstructive sleep apnea (adult) (pediatric): Secondary | ICD-10-CM | POA: Diagnosis not present

## 2020-03-12 DIAGNOSIS — I4891 Unspecified atrial fibrillation: Secondary | ICD-10-CM

## 2020-03-12 DIAGNOSIS — E785 Hyperlipidemia, unspecified: Secondary | ICD-10-CM

## 2020-03-12 DIAGNOSIS — I2510 Atherosclerotic heart disease of native coronary artery without angina pectoris: Secondary | ICD-10-CM | POA: Diagnosis not present

## 2020-03-12 NOTE — Patient Instructions (Signed)
Medication Instructions:  Your physician recommends that you continue on your current medications as directed. Please refer to the Current Medication list given to you today.  *If you need a refill on your cardiac medications before your next appointment, please call your pharmacy*  Follow-Up: At [facility], you and your health needs are our priority.  As part of our continuing mission to provide you with exceptional heart care, we have created designated Provider Care Teams.  These Care Teams include your primary Cardiologist (physician) and Advanced Practice Providers (APPs -  Physician Assistants and Nurse Practitioners) who all work together to provide you with the care you need, when you need it.  We recommend signing up for the patient portal called "MyChart".  Sign up information is provided on this After Visit Summary.  MyChart is used to connect with patients for Virtual Visits (Telemedicine).  Patients are able to view lab/test results, encounter notes, upcoming appointments, etc.  Non-urgent messages can be sent to your provider as well.   To learn more about what you can do with MyChart, go to [email or web address].    Your next appointment:   3 month(s)  The format for your next appointment:   In Person  Provider:    Kerin Ransom, PA-C  Sande Rives, PA-C  Coletta Memos, FNP   12 months with Dr. Gwenlyn Found

## 2020-03-12 NOTE — Assessment & Plan Note (Signed)
History of obstructive sleep apnea wearing CPAP half the time
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
## 2020-03-12 NOTE — Assessment & Plan Note (Signed)
History of CAD status post non-STEMI in [place] after which she was transferred to [facility] where he underwent cardiac catheterization revealing multivessel disease and finally transferred to [facility] where he underwent CABG x6 by Dr. Servando Snare 03/22/2017.

## 2020-03-12 NOTE — Assessment & Plan Note (Signed)
History of essential hypertension blood pressure measured today 106/70.  He was mistakenly taking his olmesartan/hydrochlorothiazide at double the dose for several days resulting in significant drop in his blood pressure we talked about the importance of accurate medication compliance.
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
## 2020-03-12 NOTE — Assessment & Plan Note (Signed)
BMI of 39, weight of 304, minimally changed

## 2020-03-12 NOTE — Assessment & Plan Note (Signed)
History of hyperlipidemia being followed by Dr. Debara Pickett in the lipid clinic with marked improvement in his lipid profile.  Continues to have hypertriglyceridemia probably related from dietary indiscretion alcohol.

## 2020-03-28 ENCOUNTER — Other Ambulatory Visit: Payer: Self-pay | Admitting: *Deleted

## 2020-03-28 DIAGNOSIS — I872 Venous insufficiency (chronic) (peripheral): Secondary | ICD-10-CM

## 2020-04-05 ENCOUNTER — Encounter: Payer: Self-pay | Admitting: Vascular Surgery

## 2020-04-05 ENCOUNTER — Ambulatory Visit (INDEPENDENT_AMBULATORY_CARE_PROVIDER_SITE_OTHER): Payer: Medicare Other | Admitting: Vascular Surgery

## 2020-04-05 ENCOUNTER — Ambulatory Visit (HOSPITAL_COMMUNITY)
Admission: RE | Admit: 2020-04-05 | Discharge: 2020-04-05 | Disposition: A | Discharge status: Home / Self Care | Payer: Medicare Other | Source: Ambulatory Visit | Attending: Vascular Surgery | Admitting: Vascular Surgery

## 2020-04-05 ENCOUNTER — Other Ambulatory Visit: Payer: Self-pay

## 2020-04-05 VITALS — BP 126/74 | HR 81 | Temp 97.7°F | Resp 20 | Ht 74.0 in | Wt 305.0 lb

## 2020-04-05 DIAGNOSIS — I872 Venous insufficiency (chronic) (peripheral): Secondary | ICD-10-CM

## 2020-04-05 NOTE — Progress Notes (Signed)
Patient ID: David Frost, male   DOB: 06/03/1951, 68 y.o.   MRN: [id number]  Reason for Consult: No chief complaint on file.   Referred by [name], MD  Subjective:     HPI:  David Frost is a 69 y.o. male history of coronary artery disease status post coronary artery bypass grafting in 2018.  He also has a history of atrial fibrillation and is followed by Dr. Debara Pickett for lipid management.  He is sent today for evaluation of peripheral arterial disease.  Other risk factors include diabetes and hypertension and he is a former 2 pack/day smoker.  He states that he sleeps in a chair with his legs dependent.  He does have compression stockings currently does not wear them.  The only exercise he does is on a recumbent bike as he has significant knee pain which is his chief complaint.  He does not have any tissue loss or ulceration.  Does have some weeping from his legs.  Was told he needs to lose 20 pounds prior to having knee surgery I which she has not lost any.  Past Medical History:  Diagnosis Date  . Anxiety   . Coronary artery disease    a. s/p CABG in 03/2017 with LIMA-LAD, reverse SVG-intermediate, sequential reverse SVG-OM1-dLCx, and sequential reverse SVG-PDA-PLB  . Diabetes mellitus without complication ([place])    type 2  . Dietary indiscretion   . Dyspnea   . GERD (gastroesophageal reflux disease)   . Hypertension   . Obesity   . Obstructive sleep apnea   . PAF (paroxysmal atrial fibrillation) ([place])    a. s/p DCCV in 04/2017  . Right bundle branch block    Family History  Problem Relation Age of Onset  . Heart attack Father    Past Surgical History:  Procedure Laterality Date  . CARDIAC CATHETERIZATION    . CARDIOVERSION N/A 05/04/2017   Procedure: CARDIOVERSION;  Surgeon: Skeet Latch, MD;  Location: [place];  Service: Cardiovascular;  Laterality: N/A;  . CORONARY ARTERY BYPASS GRAFT N/A 03/22/2017   Procedure: CORONARY ARTERY BYPASS GRAFTING (CABG)  x6, using the left internal mammary artery and the greater saphenous vein harvested endoscopically from the right thigh and calf and the left thigh. -LIMA to LAD -SVG to RAMUS INTERMEDIATE -SEQ SVG to OM1 and DISTAL LEFT CIRCUMFLEX -SEQ SVG to PDA and PLVB;  Surgeon: [name], MD;  Location: [place];  Service: Open Heart Surgery;  Laterality  . DOPPLER ECHOCARDIOGRAPHY  2010  . NM MYOVIEW LTD  2007  . TEE WITHOUT CARDIOVERSION N/A 03/22/2017   Procedure: TRANSESOPHAGEAL ECHOCARDIOGRAM (TEE);  Surgeon: [name], MD;  Location: [place];  Service: Open Heart Surgery;  Laterality: N/A;  . TONSILLECTOMY      Short Social History:  Social History   Tobacco Use  . Smoking status: Former Smoker    Years: 2.00    Types: Cigars    Quit date: 03/08/2017    Years since quitting: 3.0  . Smokeless tobacco: Never Used  Substance Use Topics  . Alcohol use: No    Alcohol/week: 70.0 standard drinks    Types: 70 Cans of beer per week    Comment: pt states not drinking since CABG    No Known Allergies  Current Outpatient Medications  Medication Sig Dispense Refill  . acetaminophen (TYLENOL) 325 MG tablet Take 2 tablets (650 mg total) by mouth every 6 (six) hours as needed for mild pain.    Marland Kitchen  ALPRAZolam (XANAX) 0.25 MG tablet Take 1 tablet (0.25 mg total) by mouth 2 (two) times daily as needed for anxiety. 14 tablet 0  . aspirin EC 81 MG tablet Take 1 tablet (81 mg total) by mouth daily. 90 tablet 3  . atorvastatin (LIPITOR) 40 MG tablet Take 1 tablet (40 mg total) by mouth daily. 90 tablet 3  . bimatoprost (LUMIGAN) 0.01 % SOLN Lumigan 0.01 % eye drops  INSTILL 1 DROP INTO LEFT EYE EVERY NIGHT AT BEDTIME    . brimonidine-timolol (COMBIGAN) 0.2-0.5 % ophthalmic solution Place 1 drop into both eyes 2 (two) times daily.     . brinzolamide (AZOPT) 1 % ophthalmic suspension 1 drop in both eye twice weekly    . Canagliflozin (INVOKANA) 300 MG TABS Take 300 mg by mouth daily.    Marland Kitchen icosapent  Ethyl (VASCEPA) 1 g capsule Take 2 capsules (2 g total) by mouth 2 (two) times daily. 120 capsule 11  . loratadine (CLARITIN) 10 MG tablet Take 10 mg by mouth daily.    . metoprolol succinate (TOPROL-XL) 50 MG 24 hr tablet Take 1 tablet (50 mg total) by mouth daily. 60 tablet 2  . Multiple Vitamins-Minerals (PRESERVISION AREDS 2 PO) Take 1 capsule by mouth 2 (two) times daily.    . nitroGLYCERIN (NITROSTAT) 0.4 MG SL tablet PLACE 1 TABLET UNDER THE TONGUE EVERY 5 MINUTES AS NEEDED FOR CHEST PAIN 25 tablet 0  . olmesartan-hydrochlorothiazide (BENICAR HCT) 40-12.5 MG tablet Take 1 tablet by mouth every morning.    Marland Kitchen PROAIR RESPICLICK 151 (90 Base) MCG/ACT AEPB Take 2 puffs by mouth as directed.  1  . Semaglutide, 1 MG/DOSE, (OZEMPIC, 1 MG/DOSE,) 2 MG/1.5ML SOPN Ozempic 1 mg/dose (2 mg/1.5 mL) subcutaneous pen injector  INJECT 1 MG UNDER THE SKIN ONCE A WEEK     No current facility-administered medications for this visit.    Review of Systems  Constitutional:  Constitutional negative. HENT: HENT negative.  Eyes: Eyes negative.  Respiratory: Respiratory negative.  Cardiovascular: Cardiovascular negative.  GI: Gastrointestinal negative.  Musculoskeletal: Musculoskeletal negative.  Neurological: Neurological negative. Hematologic: Hematologic/lymphatic negative.  Psychiatric: Psychiatric negative.        Objective:  Objective  Vitals:   04/05/20 1208  BP: 126/74  Pulse: 81  Resp: 20  Temp: 97.7 F (36.5 C)  SpO2: 97%    Physical Exam Constitutional:      Appearance: He is obese.  HENT:     Head: Normocephalic.     Nose:     Comments: Wearing a mask Eyes:     Pupils: Pupils are equal, round, and reactive to light.  Cardiovascular:     Rate and Rhythm: Normal rate.  Pulmonary:     Effort: Pulmonary effort is normal.  Abdominal:     General: Abdomen is flat.     Palpations: Abdomen is soft.  Musculoskeletal:        General: Swelling present. Normal range of motion.      Cervical back: Normal range of motion and neck supple.  Skin:    General: Skin is warm.     Capillary Refill: Capillary refill takes less than 2 seconds.     Comments: Bilateral legs are weeping in some areas, he has hemosiderin deposition throughout bilateral legs  Neurological:     General: No focal deficit present.     Mental Status: He is alert.  Psychiatric:        Mood and Affect: Mood normal.  Behavior: Behavior normal.        Thought Content: Thought content normal.        Judgment: Judgment normal.     Data: I have independently interpreted his ABIs to be 1.05 right with toe pressure 122 and left is 0.96 with toe pressure 136 and both waveforms are triphasic.     Assessment/Plan:     69 year old male with hemosiderin deposition bilateral legs.  Normal ABIs with palpable pulses.  I recommended continued exercise on his bike as well as pool exercise as he has access to a swimming pool that he could use daily.  I have also recommended sleeping with his legs elevated and also elevating his legs when recumbent and also wearing compression stockings daily.  We will get him to follow-up in a few months with lower extremity reflux studies although both saphenous veins have been harvested in the past for coronary artery bypass grafting.  He demonstrates good understanding.     Waynetta Sandy MD [facility]

## 2020-04-09 ENCOUNTER — Other Ambulatory Visit: Payer: Self-pay | Admitting: *Deleted

## 2020-04-09 DIAGNOSIS — I872 Venous insufficiency (chronic) (peripheral): Secondary | ICD-10-CM

## 2020-04-29 DIAGNOSIS — H35033 Hypertensive retinopathy, bilateral: Secondary | ICD-10-CM | POA: Diagnosis not present

## 2020-04-29 DIAGNOSIS — H353122 Nonexudative age-related macular degeneration, left eye, intermediate dry stage: Secondary | ICD-10-CM | POA: Diagnosis not present

## 2020-04-29 DIAGNOSIS — H35373 Puckering of macula, bilateral: Secondary | ICD-10-CM | POA: Diagnosis not present

## 2020-04-29 DIAGNOSIS — H353111 Nonexudative age-related macular degeneration, right eye, early dry stage: Secondary | ICD-10-CM | POA: Diagnosis not present

## 2020-05-10 DIAGNOSIS — E785 Hyperlipidemia, unspecified: Secondary | ICD-10-CM | POA: Diagnosis not present

## 2020-05-10 LAB — LIPID PANEL
Chol/HDL Ratio: 3.7 ratio (ref 0.0–5.0)
Cholesterol, Total: 147 mg/dL (ref 100–199)
HDL: 40 mg/dL (ref >39)
LDL Chol Calc (NIH): 50 mg/dL (ref 0–99)
Triglycerides: 381 mg/dL — ABNORMAL HIGH (ref 0–149)
VLDL Cholesterol Cal: 57 mg/dL — ABNORMAL HIGH (ref 5–40)

## 2020-05-16 ENCOUNTER — Telehealth (INDEPENDENT_AMBULATORY_CARE_PROVIDER_SITE_OTHER): Payer: Medicare Other | Admitting: Internal Medicine

## 2020-05-16 ENCOUNTER — Encounter: Payer: Self-pay | Admitting: Internal Medicine

## 2020-05-16 VITALS — BP 146/82 | HR 80 | Ht 74.0 in | Wt 300.0 lb

## 2020-05-16 DIAGNOSIS — E785 Hyperlipidemia, unspecified: Secondary | ICD-10-CM

## 2020-05-16 DIAGNOSIS — I2510 Atherosclerotic heart disease of native coronary artery without angina pectoris: Secondary | ICD-10-CM | POA: Diagnosis not present

## 2020-05-16 DIAGNOSIS — E782 Mixed hyperlipidemia: Secondary | ICD-10-CM

## 2020-05-16 NOTE — Progress Notes (Signed)
Virtual Visit via Video Note   This visit type was conducted due to national recommendations for restrictions regarding the COVID-19 Pandemic (e.g. social distancing) in an effort to limit this patient's exposure and mitigate transmission in our community.  Due to his co-morbid illnesses, this patient is at least at moderate risk for complications without adequate follow up.  This format is felt to be most appropriate for this patient at this time.  All issues noted in this document were discussed and addressed.  A limited physical exam was performed with this format.  Please refer to the patient's chart for his consent to telehealth for [facility].   Evaluation Performed:  Caregility video visit  Date:  05/16/2020   ID:  David Frost, DOB 1951/04/19, MRN [id number]  Patient Location:  [place] [address]  Provider location:   [address] [place] [address], [place] [address]  PCP:  [name], MD  Cardiologist:  Quay Burow, MD Electrophysiologist:  None   Chief Complaint:  No complaints  History of Present Illness:    David Frost is a 69 y.o. male who presents via audio/video conferencing for a telehealth visit today.  [name] is seen today via virtual visit.  He is currently referred by Dr. Gwenlyn Found for management of dyslipidemia.  Most recently he had labs in August 2020 by his primary care physician.  This demonstrated total cholesterol 165, triglycerides 456, HDL 37 and LDL 37.  He does not believe this was a fasting study.  He does have a history of bypass surgery and in the past after his surgery he noted that he was very strict on his diet and at one point his triglycerides were 135.  Since then he says he is "slacked up" on his diet and exercise.  Over the past 6 months however he converted to bedroom in his house to a gym.  He purchased an exercise bike and has been over hauling his diet.  He thinks his Frost are much improved.  We  talked about the significance of elevated triglycerides according to their increased residual risk.  Ultimately, with his coronary history, even with significant improvement in his triglycerides if they remain elevated despite exercise and dietary modifications, he is a good candidate for additional therapy.  05/16/2020  Mr. Dunn was seen today via video visit.  He was wholly apologetic about not following a strict diet.  He noted that his triglycerides have overall come down but trended up in the last several months.  He says that he is not been exercising like he used to and his diet is "terrible".  He is compliant with the Vascepa 2 g twice daily.  The patient does not have symptoms concerning for COVID-19 infection (fever, chills, cough, or new SHORTNESS OF BREATH).    Prior CV studies:   The following studies were reviewed today:  Deferred  PMHx:  Past Medical History:  Diagnosis Date  . Anxiety   . Coronary artery disease    a. s/p CABG in 03/2017 with LIMA-LAD, reverse SVG-intermediate, sequential reverse SVG-OM1-dLCx, and sequential reverse SVG-PDA-PLB  . Diabetes mellitus without complication ([place])    type 2  . Dietary indiscretion   . Dyspnea   . GERD (gastroesophageal reflux disease)   . Hypertension   . Obesity   . Obstructive sleep apnea   . PAF (paroxysmal atrial fibrillation) ([place])    a. s/p DCCV in 04/2017  . Right bundle branch block  Past Surgical History:  Procedure Laterality Date  . CARDIAC CATHETERIZATION    . CARDIOVERSION N/A 05/04/2017   Procedure: CARDIOVERSION;  Surgeon: Skeet Latch, MD;  Location: [place];  Service: Cardiovascular;  Laterality: N/A;  . CORONARY ARTERY BYPASS GRAFT N/A 03/22/2017   Procedure: CORONARY ARTERY BYPASS GRAFTING (CABG) x6, using the left internal mammary artery and the greater saphenous vein harvested endoscopically from the right thigh and calf and the left thigh. -LIMA to LAD -SVG to RAMUS INTERMEDIATE -SEQ  SVG to OM1 and DISTAL LEFT CIRCUMFLEX -SEQ SVG to PDA and PLVB;  Surgeon: [name], MD;  Location: [place];  Service: Open Heart Surgery;  Laterality  . DOPPLER ECHOCARDIOGRAPHY  2010  . NM MYOVIEW LTD  2007  . TEE WITHOUT CARDIOVERSION N/A 03/22/2017   Procedure: TRANSESOPHAGEAL ECHOCARDIOGRAM (TEE);  Surgeon: [name], MD;  Location: [place];  Service: Open Heart Surgery;  Laterality: N/A;  . TONSILLECTOMY      FAMHx:  Family History  Problem Relation Age of Onset  . Heart attack Father     SOCHx:   reports that he quit smoking about 3 years ago. His smoking use included cigars. He quit after 2.00 years of use. He has never used smokeless tobacco. He reports that he does not drink alcohol and does not use drugs.  ALLERGIES:  No Known Allergies  MEDS:  Current Meds  Medication Sig  . acetaminophen (TYLENOL) 325 MG tablet Take 2 tablets (650 mg total) by mouth every 6 (six) hours as needed for mild pain.  Marland Kitchen ALPRAZolam (XANAX) 0.25 MG tablet Take 1 tablet (0.25 mg total) by mouth 2 (two) times daily as needed for anxiety.  Marland Kitchen aspirin EC 81 MG tablet Take 1 tablet (81 mg total) by mouth daily.  Marland Kitchen atorvastatin (LIPITOR) 40 MG tablet Take 1 tablet (40 mg total) by mouth daily.  . bimatoprost (LUMIGAN) 0.01 % SOLN Lumigan 0.01 % eye drops  INSTILL 1 DROP INTO LEFT EYE EVERY NIGHT AT BEDTIME  . brinzolamide (AZOPT) 1 % ophthalmic suspension 1 drop in both eye twice weekly  . Canagliflozin (INVOKANA) 300 MG TABS Take 300 mg by mouth daily.  Marland Kitchen icosapent Ethyl (VASCEPA) 1 g capsule Take 2 capsules (2 g total) by mouth 2 (two) times daily.  Marland Kitchen loratadine (CLARITIN) 10 MG tablet Take 10 mg by mouth daily.  . metoprolol succinate (TOPROL-XL) 50 MG 24 hr tablet Take 1 tablet (50 mg total) by mouth daily.  . Multiple Vitamins-Minerals (PRESERVISION AREDS 2 PO) Take 1 capsule by mouth 2 (two) times daily.  . nitroGLYCERIN (NITROSTAT) 0.4 MG SL tablet PLACE 1 TABLET UNDER THE TONGUE  EVERY 5 MINUTES AS NEEDED FOR CHEST PAIN  . olmesartan-hydrochlorothiazide (BENICAR HCT) 40-12.5 MG tablet Take 1 tablet by mouth every morning.  Marland Kitchen PROAIR RESPICLICK 621 (90 Base) MCG/ACT AEPB Take 2 puffs by mouth as directed.  . Semaglutide, 1 MG/DOSE, (OZEMPIC, 1 MG/DOSE,) 2 MG/1.5ML SOPN Ozempic 1 mg/dose (2 mg/1.5 mL) subcutaneous pen injector  INJECT 1 MG UNDER THE SKIN ONCE A WEEK     ROS: Pertinent items noted in HPI and remainder of comprehensive ROS otherwise negative.  Labs/Other Tests and Data Reviewed:    Recent Labs: No results found for requested labs within last 8760 hours.   Recent Lipid Panel Lab Results  Component Value Date/Time   CHOL 147 05/10/2020 08:07 AM   TRIG 381 (H) 05/10/2020 08:07 AM   HDL 40 05/10/2020 08:07 AM  CHOLHDL 3.7 05/10/2020 08:07 AM   CHOLHDL 6.6 03/18/2017 05:03 AM   LDLCALC 50 05/10/2020 08:07 AM    Wt Readings from Last 3 Encounters:  05/16/20 300 lb (136.1 kg)  04/05/20 (!) 305 lb (138.3 kg)  03/12/20 (!) 304 lb 12.8 oz (138.3 kg)     Exam:    Vital Signs:  BP (!) 146/82   Pulse 80   Ht 6\' 2"  (1.88 m)   Wt 300 lb (136.1 kg)   SpO2 97%   BMI 38.52 kg/m    General appearance: alert and no distress Lungs: no audible respiratory difficulty Abdomen: obese Extremities: extremities normal, atraumatic, no cyanosis or edema Skin: Skin color, texture, turgor normal. No rashes or lesions Neurologic: Mental status: Alert, oriented, thought content appropriate Psych: Pleasant  ASSESSMENT & PLAN:    1. Dyslipidemia with high triglycerides, LDL goal less than 70 2. Coronary artery disease status post CABG 3. Type 2 diabetes-controlled 4. Essential hypertension 5. Morbid obesity 6. OSA-noncompliant with CPAP  Mr. Micalizzi had some improvement with his triglycerides, but his diet has slacked up over the last several months according to his own admission.  He says he is also been less physically active.  He used to ride a  stationary bike but has been too hard for him to do that.  He said he was going to work diligently to try to improve his Frost and we will plan to repeat lipid profile in 6 months.  COVID-19 Education: The signs and symptoms of COVID-19 were discussed with the patient and how to seek care for testing (follow up with PCP or arrange E-visit).  The importance of social distancing was discussed today.  Patient Risk:   After full review of this patients clinical status, I feel that they are at least moderate risk at this time.  Time:   Today, I have spent 15 minutes with the patient with telehealth technology discussing dyslipidemia, coronary disease, elevated triglycerides, type 2 diabetes.     Medication Adjustments/Labs and Tests Ordered: Current medicines are reviewed at length with the patient today.  Concerns regarding medicines are outlined above.   Tests Ordered: No orders of the defined types were placed in this encounter.   Medication Changes: No orders of the defined types were placed in this encounter.   Disposition:  in 6 month(s)  Pixie Casino, MD, [facility], [place] Director of the Advanced Lipid Disorders &  Cardiovascular Risk Reduction Clinic Diplomate of the American Board of Clinical Lipidology Attending Cardiologist  Direct Dial: [phone]  Fax: [phone]  Website:  [email or web address]  Pixie Casino, MD  05/16/2020 8:09 AM

## 2020-05-16 NOTE — Patient Instructions (Signed)
Medication Instructions:  Your physician recommends that you continue on your current medications as directed. Please refer to the Current Medication list given to you today.  *If you need a refill on your cardiac medications before your next appointment, please call your pharmacy*   Lab Work: FASTING lipid panel in 6 months to check cholesterol   If you have labs (blood work) drawn today and your tests are completely normal, you will receive your results only by: Marland Kitchen MyChart Message (if you have MyChart) OR . A paper copy in the mail If you have any lab test that is abnormal or we need to change your treatment, we will call you to review the results.   Testing/Procedures: NONE   Follow-Up: At [facility], you and your health needs are our priority.  As part of our continuing mission to provide you with exceptional heart care, we have created designated Provider Care Teams.  These Care Teams include your primary Cardiologist (physician) and Advanced Practice Providers (APPs -  Physician Assistants and Nurse Practitioners) who all work together to provide you with the care you need, when you need it.  We recommend signing up for the patient portal called "MyChart".  Sign up information is provided on this After Visit Summary.  MyChart is used to connect with patients for Virtual Visits (Telemedicine).  Patients are able to view lab/test results, encounter notes, upcoming appointments, etc.  Non-urgent messages can be sent to your provider as well.   To learn more about what you can do with MyChart, go to [email or web address].    Your next appointment:   6 month(s) - lipid clinic  The format for your next appointment:   In Person or Virtual  Provider:    Dr. Lyman Bishop    Other Instructions

## 2020-05-16 NOTE — Addendum Note (Signed)
Addended by: Fidel Levy on: 05/16/2020 08:20 AM   Modules accepted: Orders

## 2020-05-30 DIAGNOSIS — Z23 Encounter for immunization: Secondary | ICD-10-CM | POA: Diagnosis not present

## 2020-06-06 ENCOUNTER — Ambulatory Visit: Payer: Medicare Other | Admitting: General Practice

## 2020-06-12 ENCOUNTER — Ambulatory Visit (INDEPENDENT_AMBULATORY_CARE_PROVIDER_SITE_OTHER): Payer: Medicare Other | Admitting: Cardiovascular Disease

## 2020-06-12 ENCOUNTER — Other Ambulatory Visit: Payer: Self-pay

## 2020-06-12 ENCOUNTER — Encounter: Payer: Self-pay | Admitting: Cardiovascular Disease

## 2020-06-12 DIAGNOSIS — F1010 Alcohol abuse, uncomplicated: Secondary | ICD-10-CM

## 2020-06-12 DIAGNOSIS — I2510 Atherosclerotic heart disease of native coronary artery without angina pectoris: Secondary | ICD-10-CM | POA: Diagnosis not present

## 2020-06-12 DIAGNOSIS — I4891 Unspecified atrial fibrillation: Secondary | ICD-10-CM

## 2020-06-12 DIAGNOSIS — I4510 Unspecified right bundle-branch block: Secondary | ICD-10-CM | POA: Diagnosis not present

## 2020-06-12 DIAGNOSIS — I10 Essential (primary) hypertension: Secondary | ICD-10-CM

## 2020-06-12 DIAGNOSIS — I214 Non-ST elevation (NSTEMI) myocardial infarction: Secondary | ICD-10-CM | POA: Diagnosis not present

## 2020-06-12 DIAGNOSIS — E785 Hyperlipidemia, unspecified: Secondary | ICD-10-CM

## 2020-06-12 DIAGNOSIS — G4733 Obstructive sleep apnea (adult) (pediatric): Secondary | ICD-10-CM

## 2020-06-12 NOTE — Patient Instructions (Signed)
Medication Instructions:  Your physician recommends that you continue on your current medications as directed. Please refer to the Current Medication list given to you today.  *If you need a refill on your cardiac medications before your next appointment, please call your pharmacy*   Follow-Up: At [facility], you and your health needs are our priority.  As part of our continuing mission to provide you with exceptional heart care, we have created designated Provider Care Teams.  These Care Teams include your primary Cardiologist (physician) and Advanced Practice Providers (APPs -  Physician Assistants and Nurse Practitioners) who all work together to provide you with the care you need, when you need it.  We recommend signing up for the patient portal called "MyChart".  Sign up information is provided on this After Visit Summary.  MyChart is used to connect with patients for Virtual Visits (Telemedicine).  Patients are able to view lab/test results, encounter notes, upcoming appointments, etc.  Non-urgent messages can be sent to your provider as well.   To learn more about what you can do with MyChart, go to [email or web address].    Your next appointment:   6 month(s)  The format for your next appointment:   In Person  Provider:   You may see Quay Burow, MD or one of the following Advanced Practice Providers on your designated Care Team:    Kerin Ransom, PA-C  [place], [place]  Coletta Memos, [place]    Other Instructions You have been referred to Dennard Nip MD [facility] [place]. [place], [place] [address] [phone]

## 2020-06-12 NOTE — Assessment & Plan Note (Addendum)
History of hyperlipidemia on atorvastatin and Vascepa with lipid profile performed 05/10/2020 revealing total cholesterol of one hundred and forty-seven, LDL of fifty and HDL of forty.  His triglycerides were persistently elevated at three hundred eighty-one.

## 2020-06-12 NOTE — Assessment & Plan Note (Signed)
History of CAD status post cardiac catheterization in [place] after non-STEMI in [place] 03/15/2017.  He was transferred to [facility] where he underwent coronary artery bypass grafting x6 by Dr. Servando Snare 03/22/2017.  His postop course was uncomplicated.  He was readmitted shortly thereafter with A. fib with RVR and was placed on amiodarone anticoagulation.  Ultimately underwent cardioversion by Dr. Oval Linsey 05/04/2017.

## 2020-06-12 NOTE — Assessment & Plan Note (Signed)
History of periop A. fib as well as a recurrent episode because of missed medication.  He said no recurrent A. fib and is not on oral anticoagulant at this time.

## 2020-06-12 NOTE — Assessment & Plan Note (Signed)
Chronic. 

## 2020-06-12 NOTE — Assessment & Plan Note (Signed)
History of essential hypertension a blood pressure measured today 140/90.  He is on metoprolol, Benicar and hydrochlorothiazide.

## 2020-06-12 NOTE — Assessment & Plan Note (Signed)
Discontinue alcohol 2 weeks ago

## 2020-06-12 NOTE — Assessment & Plan Note (Signed)
History of morbid obesity with a BMI of forty, weight of three thirteen, unchanged over the last several years despite attempts at dietary modification.  We have talked about referral to a diet and weight loss center which she is now receptive to.  We will refer him him to Dr. Leafy Ro.

## 2020-06-12 NOTE — Progress Notes (Signed)
06/12/2020 [name]   1951/01/10  [id number]  Primary Physician [name], MD Primary Cardiologist: Lorretta Harp MD FACP, [place], [place], [place]  HPI:  David Frost is a 69 y.o.  severely overweight divorced Caucasian male father of 2 sonsNicki Reaper and Ryan)who I last sawsaw in the office 03/12/2020. He also sees Dr. Debara Pickett in the lipid clinic as recently as last month.  Has a history of obesity, hypertension, chronic right bundle branch block and symptoms compatible sleep apnea. He hasobstructive sleep apnea on CPAP.He does admit to dietary indiscretion. He says that he loved bagels and salt. He had a Myoview stress test in 2007 which was low risk and a 2-D echo to and 10 that showed normal LV function. He had a non-STEMI in [place] 03/15/17 and was transferred to [facility] where he underwent cardiac catheterization revealing multivessel disease. He was then transferred to [facility] where he underwent coronary artery bypass grafting 6 by Dr. Servando Snare on 03/22/17. His postoperative course was uncomplicated. Soon after discharge she was remitted with A. fib with RVR from whichhewas symptomatic from and was rate controlled, placed on amiodarone and oral anticoagulation.  On 05/04/17 he underwent outpatient DC cardioversion by Dr. Oval Linsey successfully to sinus rhythm with one shock. He has done well since.Unfortunately, he has not been able to lose weight over the last year. He has not exercised either he does have sleep apneabut does not use his CPAP. He sleeps in a chair.  He has been seen by Dr. Debara Pickett in the lipid clinic and is changing his diet.  His lipid profile has improved as have his triglycerides.  Continues to drink wine on a daily basis but does not drink caffeine.  He was seen in the ER in [place] this past Wednesday for hypotension, A. fib with RVR.  Apparently he was taking his olmesartan at twice the dose mistakenly.  He  was placed on IV infusion and converted in 4 hours.  Since I saw him 3 months ago he continues to do well.  His lipid profile has markedly improved.  He has stopped drinking alcohol 2 weeks ago and is drinking decaffeinated coffee and green tea.  He denies chest pain, shortness of breath or palpitations.  Said no recurrence of his A. fib.  Does check his blood pressure at home twice a day.  We had a long discussion about weight loss and his inability to do so.  We decided to take an alternate path and refer him to a diet and weight loss center.  Current Meds  Medication Sig  . acetaminophen (TYLENOL) 325 MG tablet Take 2 tablets (650 mg total) by mouth every 6 (six) hours as needed for mild pain.  Marland Kitchen ALPRAZolam (XANAX) 0.25 MG tablet Take 1 tablet (0.25 mg total) by mouth 2 (two) times daily as needed for anxiety.  Marland Kitchen aspirin EC 81 MG tablet Take 1 tablet (81 mg total) by mouth daily.  Marland Kitchen atorvastatin (LIPITOR) 40 MG tablet Take 1 tablet (40 mg total) by mouth daily.  . bimatoprost (LUMIGAN) 0.01 % SOLN Lumigan 0.01 % eye drops  INSTILL 1 DROP INTO LEFT EYE EVERY NIGHT AT BEDTIME  . brinzolamide (AZOPT) 1 % ophthalmic suspension 1 drop in both eye twice weekly  . Canagliflozin (INVOKANA) 300 MG TABS Take 300 mg by mouth daily.  Marland Kitchen icosapent Ethyl (VASCEPA) 1 g capsule Take 2 capsules (2 g total) by mouth 2 (two) times  daily.  . loratadine (CLARITIN) 10 MG tablet Take 10 mg by mouth daily.  . metoprolol succinate (TOPROL-XL) 50 MG 24 hr tablet Take 1 tablet (50 mg total) by mouth daily.  . Multiple Vitamins-Minerals (PRESERVISION AREDS 2 PO) Take 1 capsule by mouth 2 (two) times daily.  . nitroGLYCERIN (NITROSTAT) 0.4 MG SL tablet PLACE 1 TABLET UNDER THE TONGUE EVERY 5 MINUTES AS NEEDED FOR CHEST PAIN  . olmesartan-hydrochlorothiazide (BENICAR HCT) 40-12.5 MG tablet Take 1 tablet by mouth every morning.  Marland Kitchen PROAIR RESPICLICK 580 (90 Base) MCG/ACT AEPB Take 2 puffs by mouth as directed.  .  Semaglutide, 1 MG/DOSE, (OZEMPIC, 1 MG/DOSE,) 2 MG/1.5ML SOPN Ozempic 1 mg/dose (2 mg/1.5 mL) subcutaneous pen injector  INJECT 1 MG UNDER THE SKIN ONCE A WEEK     No Known Allergies  Social History   Socioeconomic History  . Marital status: Divorced    Spouse name: Not on file  . Number of children: Not on file  . Years of education: Not on file  . Highest education level: Not on file  Occupational History  . Not on file  Tobacco Use  . Smoking status: Former Smoker    Years: 2.00    Types: Cigars    Quit date: 03/08/2017    Years since quitting: 3.2  . Smokeless tobacco: Never Used  Vaping Use  . Vaping Use: Never used  Substance and Sexual Activity  . Alcohol use: No    Alcohol/week: 70.0 standard drinks    Types: 70 Cans of beer per week    Comment: pt states not drinking since CABG  . Drug use: No  . Sexual activity: Not on file  Other Topics Concern  . Not on file  Social History Narrative  . Not on file   Social Determinants of Health   Financial Resource Strain:   . Difficulty of Paying Living Expenses: Not on file  Food Insecurity:   . Worried About Charity fundraiser in the Last Year: Not on file  . Ran Out of Food in the Last Year: Not on file  Transportation Needs:   . Lack of Transportation (Medical): Not on file  . Lack of Transportation (Non-Medical): Not on file  Physical Activity:   . Days of Exercise per Week: Not on file  . Minutes of Exercise per Session: Not on file  Stress:   . Feeling of Stress : Not on file  Social Connections:   . Frequency of Communication with Friends and Family: Not on file  . Frequency of Social Gatherings with Friends and Family: Not on file  . Attends Religious Services: Not on file  . Active Member of Clubs or Organizations: Not on file  . Attends Archivist Meetings: Not on file  . Marital Status: Not on file  Intimate Partner Violence:   . Fear of Current or Ex-Partner: Not on file  . Emotionally  Abused: Not on file  . Physically Abused: Not on file  . Sexually Abused: Not on file     Review of Systems: General: negative for chills, fever, night sweats or weight changes.  Cardiovascular: negative for chest pain, dyspnea on exertion, edema, orthopnea, palpitations, paroxysmal nocturnal dyspnea or shortness of breath Dermatological: negative for rash Respiratory: negative for cough or wheezing Urologic: negative for hematuria Abdominal: negative for nausea, vomiting, diarrhea, bright red blood per rectum, melena, or hematemesis Neurologic: negative for visual changes, syncope, or dizziness All other systems reviewed and are otherwise  negative except as noted above.    Blood pressure 140/90, pulse 65, height 6\' 2"  (1.88 m), weight (!) 313 lb (142 kg), SpO2 97 %.  General appearance: alert and no distress Neck: no adenopathy, no carotid bruit, no JVD, supple, symmetrical, trachea midline and thyroid not enlarged, symmetric, no tenderness/mass/nodules Lungs: clear to auscultation bilaterally Heart: regular rate and rhythm, S1, S2 normal, no murmur, click, rub or gallop Extremities: extremities normal, atraumatic, no cyanosis or edema Pulses: 2+ and symmetric Skin: Skin color, texture, turgor normal. No rashes or lesions Neurologic: Alert and oriented X 3, normal strength and tone. Normal symmetric reflexes. Normal coordination and gait  EKG not performed today  ASSESSMENT AND PLAN:   OBESITY, UNSPECIFIED History of morbid obesity with a BMI of forty, weight of three thirteen, unchanged over the last several years despite attempts at dietary modification.  We have talked about referral to a diet and weight loss center which she is now receptive to.  We will refer him him to Dr. Leafy Ro.  Essential hypertension History of essential hypertension a blood pressure measured today 140/90.  He is on metoprolol, Benicar and hydrochlorothiazide.  Obstructive sleep apnea History of  obstructive sleep apnea on CPAP which she wears every other night.  Right bundle branch block Chronic  Non-ST elevation (NSTEMI) myocardial infarction [facility]) History of CAD status post cardiac catheterization in [place] after non-STEMI in [place] 03/15/2017.  He was transferred to [facility] where he underwent coronary artery bypass grafting x6 by Dr. Servando Snare 03/22/2017.  His postop course was uncomplicated.  He was readmitted shortly thereafter with A. fib with RVR and was placed on amiodarone anticoagulation.  Ultimately underwent cardioversion by Dr. Oval Linsey 05/04/2017.  Alcohol abuse Discontinue alcohol 2 weeks ago  Atrial fibrillation with RVR (HCC) History of periop A. fib as well as a recurrent episode because of missed medication.  He said no recurrent A. fib and is not on oral anticoagulant at this time.  Hyperlipidemia LDL goal <70 History of hyperlipidemia on atorvastatin and Vascepa with lipid profile performed 05/10/2020 revealing total cholesterol of one hundred and forty-seven, LDL of fifty and HDL of forty.  His triglycerides were persistently elevated at three hundred eighty-one.      Lorretta Harp MD FACP,FACC,FAHA, [facility] 06/12/2020 11:53 AM

## 2020-06-12 NOTE — Assessment & Plan Note (Signed)
History of obstructive sleep apnea on CPAP which she wears every other night.

## 2020-06-18 ENCOUNTER — Other Ambulatory Visit: Payer: Self-pay | Admitting: Cardiovascular Disease

## 2020-07-04 ENCOUNTER — Ambulatory Visit (INDEPENDENT_AMBULATORY_CARE_PROVIDER_SITE_OTHER): Payer: Self-pay | Admitting: Family Medicine

## 2020-07-11 DIAGNOSIS — E871 Hypo-osmolality and hyponatremia: Secondary | ICD-10-CM | POA: Diagnosis not present

## 2020-07-11 DIAGNOSIS — R609 Edema, unspecified: Secondary | ICD-10-CM | POA: Diagnosis not present

## 2020-07-11 DIAGNOSIS — I480 Paroxysmal atrial fibrillation: Secondary | ICD-10-CM | POA: Diagnosis not present

## 2020-07-11 DIAGNOSIS — I10 Essential (primary) hypertension: Secondary | ICD-10-CM | POA: Diagnosis not present

## 2020-07-11 DIAGNOSIS — E1151 Type 2 diabetes mellitus with diabetic peripheral angiopathy without gangrene: Secondary | ICD-10-CM | POA: Diagnosis not present

## 2020-07-11 DIAGNOSIS — I87319 Chronic venous hypertension (idiopathic) with ulcer of unspecified lower extremity: Secondary | ICD-10-CM | POA: Diagnosis not present

## 2020-07-15 DIAGNOSIS — I87319 Chronic venous hypertension (idiopathic) with ulcer of unspecified lower extremity: Secondary | ICD-10-CM | POA: Diagnosis not present

## 2020-07-15 DIAGNOSIS — R609 Edema, unspecified: Secondary | ICD-10-CM | POA: Diagnosis not present

## 2020-07-15 DIAGNOSIS — E871 Hypo-osmolality and hyponatremia: Secondary | ICD-10-CM | POA: Diagnosis not present

## 2020-07-15 DIAGNOSIS — E1151 Type 2 diabetes mellitus with diabetic peripheral angiopathy without gangrene: Secondary | ICD-10-CM | POA: Diagnosis not present

## 2020-07-15 DIAGNOSIS — I10 Essential (primary) hypertension: Secondary | ICD-10-CM | POA: Diagnosis not present

## 2020-07-18 ENCOUNTER — Ambulatory Visit (INDEPENDENT_AMBULATORY_CARE_PROVIDER_SITE_OTHER): Payer: Self-pay | Admitting: Family Medicine

## 2020-08-01 DIAGNOSIS — M1712 Unilateral primary osteoarthritis, left knee: Secondary | ICD-10-CM | POA: Diagnosis not present

## 2020-08-05 DIAGNOSIS — H35373 Puckering of macula, bilateral: Secondary | ICD-10-CM | POA: Diagnosis not present

## 2020-08-05 DIAGNOSIS — H35033 Hypertensive retinopathy, bilateral: Secondary | ICD-10-CM | POA: Diagnosis not present

## 2020-08-05 DIAGNOSIS — H353122 Nonexudative age-related macular degeneration, left eye, intermediate dry stage: Secondary | ICD-10-CM | POA: Diagnosis not present

## 2020-08-05 DIAGNOSIS — H353111 Nonexudative age-related macular degeneration, right eye, early dry stage: Secondary | ICD-10-CM | POA: Diagnosis not present

## 2020-08-08 DIAGNOSIS — M1712 Unilateral primary osteoarthritis, left knee: Secondary | ICD-10-CM | POA: Diagnosis not present

## 2020-08-14 DIAGNOSIS — M1712 Unilateral primary osteoarthritis, left knee: Secondary | ICD-10-CM | POA: Diagnosis not present

## 2020-08-19 ENCOUNTER — Telehealth: Payer: Self-pay | Admitting: Cardiovascular Disease

## 2020-08-19 NOTE — Telephone Encounter (Signed)
Spoke with patient, advised patient to save EKG on Apple watch app on his phone and save it as a PDF and advised patient he can attach it and send the EKG through MyChart by mesaage. Patient states he doesn't need to send one but just wanted to know how to in the event that he needed to.  Patient verbalized understanding of instructions.

## 2020-08-19 NOTE — Telephone Encounter (Signed)
Patient would like to know if there is a way for him to sync his apple watch to MyChart so that Dr. Gwenlyn Found can access his EKG results. If not, he is requesting an email address that he can forward his results to. Please assist.

## 2020-08-26 ENCOUNTER — Other Ambulatory Visit: Payer: Self-pay | Admitting: *Deleted

## 2020-08-26 ENCOUNTER — Other Ambulatory Visit: Payer: Self-pay | Admitting: Cardiothoracic Surgery

## 2020-08-26 DIAGNOSIS — N6320 Unspecified lump in the left breast, unspecified quadrant: Secondary | ICD-10-CM

## 2020-08-26 DIAGNOSIS — N6324 Unspecified lump in the left breast, lower inner quadrant: Secondary | ICD-10-CM

## 2020-08-26 DIAGNOSIS — Z951 Presence of aortocoronary bypass graft: Secondary | ICD-10-CM

## 2020-08-26 NOTE — Progress Notes (Signed)
David Frost contacted the office stating over the past month he has experienced an increase in size of a "knot" that formed s/p CABG in 2018 with localized tenderness. Pt denies any further heavy lifting of weights, which he contributed to pain in the knot previously. Per Dr. Servando Snare, orders placed for breast ultrasound and mammogram with needle aspiration if needed. Dr. Servando Snare will f/u with pt after imaging is performed. Pt made aware.

## 2020-09-05 ENCOUNTER — Other Ambulatory Visit: Payer: Self-pay | Admitting: Cardiothoracic Surgery

## 2020-09-05 DIAGNOSIS — N6324 Unspecified lump in the left breast, lower inner quadrant: Secondary | ICD-10-CM

## 2020-09-05 DIAGNOSIS — Z951 Presence of aortocoronary bypass graft: Secondary | ICD-10-CM

## 2020-09-05 DIAGNOSIS — N6320 Unspecified lump in the left breast, unspecified quadrant: Secondary | ICD-10-CM

## 2020-09-06 ENCOUNTER — Encounter (HOSPITAL_COMMUNITY): Payer: Medicare Other

## 2020-09-06 ENCOUNTER — Ambulatory Visit: Payer: Medicare Other

## 2020-10-17 ENCOUNTER — Other Ambulatory Visit: Payer: Self-pay

## 2020-10-17 ENCOUNTER — Ambulatory Visit
Admission: RE | Admit: 2020-10-17 | Discharge: 2020-10-17 | Disposition: A | Discharge status: Home / Self Care | Payer: Medicare Other | Source: Ambulatory Visit | Attending: Cardiothoracic Surgery | Admitting: Cardiothoracic Surgery

## 2020-10-17 DIAGNOSIS — N62 Hypertrophy of breast: Secondary | ICD-10-CM | POA: Diagnosis not present

## 2020-10-17 DIAGNOSIS — N6320 Unspecified lump in the left breast, unspecified quadrant: Secondary | ICD-10-CM

## 2020-10-17 DIAGNOSIS — N644 Mastodynia: Secondary | ICD-10-CM | POA: Diagnosis not present

## 2020-10-17 DIAGNOSIS — N6324 Unspecified lump in the left breast, lower inner quadrant: Secondary | ICD-10-CM

## 2020-10-17 DIAGNOSIS — Z951 Presence of aortocoronary bypass graft: Secondary | ICD-10-CM

## 2020-11-29 DIAGNOSIS — E1151 Type 2 diabetes mellitus with diabetic peripheral angiopathy without gangrene: Secondary | ICD-10-CM | POA: Diagnosis not present

## 2020-11-29 DIAGNOSIS — B027 Disseminated zoster: Secondary | ICD-10-CM | POA: Diagnosis not present

## 2020-12-12 DIAGNOSIS — D72829 Elevated white blood cell count, unspecified: Secondary | ICD-10-CM | POA: Diagnosis not present

## 2020-12-12 DIAGNOSIS — E782 Mixed hyperlipidemia: Secondary | ICD-10-CM | POA: Diagnosis not present

## 2020-12-12 DIAGNOSIS — M7989 Other specified soft tissue disorders: Secondary | ICD-10-CM | POA: Diagnosis not present

## 2020-12-12 DIAGNOSIS — L03115 Cellulitis of right lower limb: Secondary | ICD-10-CM | POA: Diagnosis not present

## 2020-12-13 DIAGNOSIS — L03115 Cellulitis of right lower limb: Secondary | ICD-10-CM | POA: Diagnosis not present

## 2020-12-13 DIAGNOSIS — E782 Mixed hyperlipidemia: Secondary | ICD-10-CM | POA: Diagnosis not present

## 2020-12-13 DIAGNOSIS — M7989 Other specified soft tissue disorders: Secondary | ICD-10-CM | POA: Diagnosis not present

## 2020-12-13 DIAGNOSIS — D72829 Elevated white blood cell count, unspecified: Secondary | ICD-10-CM | POA: Diagnosis not present

## 2020-12-16 DIAGNOSIS — M7989 Other specified soft tissue disorders: Secondary | ICD-10-CM | POA: Diagnosis not present

## 2020-12-16 DIAGNOSIS — D72829 Elevated white blood cell count, unspecified: Secondary | ICD-10-CM | POA: Diagnosis not present

## 2020-12-16 DIAGNOSIS — L03115 Cellulitis of right lower limb: Secondary | ICD-10-CM | POA: Diagnosis not present

## 2020-12-16 DIAGNOSIS — I10 Essential (primary) hypertension: Secondary | ICD-10-CM | POA: Diagnosis not present

## 2020-12-20 DIAGNOSIS — Z125 Encounter for screening for malignant neoplasm of prostate: Secondary | ICD-10-CM | POA: Diagnosis not present

## 2020-12-20 DIAGNOSIS — E119 Type 2 diabetes mellitus without complications: Secondary | ICD-10-CM | POA: Diagnosis not present

## 2020-12-20 DIAGNOSIS — E782 Mixed hyperlipidemia: Secondary | ICD-10-CM | POA: Diagnosis not present

## 2020-12-27 DIAGNOSIS — I25810 Atherosclerosis of coronary artery bypass graft(s) without angina pectoris: Secondary | ICD-10-CM | POA: Diagnosis not present

## 2020-12-27 DIAGNOSIS — R82998 Other abnormal findings in urine: Secondary | ICD-10-CM | POA: Diagnosis not present

## 2020-12-27 DIAGNOSIS — I87319 Chronic venous hypertension (idiopathic) with ulcer of unspecified lower extremity: Secondary | ICD-10-CM | POA: Diagnosis not present

## 2020-12-27 DIAGNOSIS — I480 Paroxysmal atrial fibrillation: Secondary | ICD-10-CM | POA: Diagnosis not present

## 2020-12-27 DIAGNOSIS — G4733 Obstructive sleep apnea (adult) (pediatric): Secondary | ICD-10-CM | POA: Diagnosis not present

## 2020-12-27 DIAGNOSIS — E1151 Type 2 diabetes mellitus with diabetic peripheral angiopathy without gangrene: Secondary | ICD-10-CM | POA: Diagnosis not present

## 2020-12-27 DIAGNOSIS — K219 Gastro-esophageal reflux disease without esophagitis: Secondary | ICD-10-CM | POA: Diagnosis not present

## 2020-12-27 DIAGNOSIS — I739 Peripheral vascular disease, unspecified: Secondary | ICD-10-CM | POA: Diagnosis not present

## 2020-12-27 DIAGNOSIS — I10 Essential (primary) hypertension: Secondary | ICD-10-CM | POA: Diagnosis not present

## 2020-12-27 DIAGNOSIS — Z0000 Encounter for general adult medical examination without abnormal findings: Secondary | ICD-10-CM | POA: Diagnosis not present

## 2021-01-08 DIAGNOSIS — H353131 Nonexudative age-related macular degeneration, bilateral, early dry stage: Secondary | ICD-10-CM | POA: Diagnosis not present

## 2021-01-08 DIAGNOSIS — H401131 Primary open-angle glaucoma, bilateral, mild stage: Secondary | ICD-10-CM | POA: Diagnosis not present

## 2021-01-08 DIAGNOSIS — H2512 Age-related nuclear cataract, left eye: Secondary | ICD-10-CM | POA: Diagnosis not present

## 2021-01-08 DIAGNOSIS — Z961 Presence of intraocular lens: Secondary | ICD-10-CM | POA: Diagnosis not present

## 2021-01-19 ENCOUNTER — Other Ambulatory Visit: Payer: Self-pay | Admitting: Internal Medicine

## 2021-01-30 DIAGNOSIS — L739 Follicular disorder, unspecified: Secondary | ICD-10-CM | POA: Diagnosis not present

## 2021-02-03 DIAGNOSIS — H35033 Hypertensive retinopathy, bilateral: Secondary | ICD-10-CM | POA: Diagnosis not present

## 2021-02-03 DIAGNOSIS — H353111 Nonexudative age-related macular degeneration, right eye, early dry stage: Secondary | ICD-10-CM | POA: Diagnosis not present

## 2021-02-03 DIAGNOSIS — H35373 Puckering of macula, bilateral: Secondary | ICD-10-CM | POA: Diagnosis not present

## 2021-02-03 DIAGNOSIS — H353122 Nonexudative age-related macular degeneration, left eye, intermediate dry stage: Secondary | ICD-10-CM | POA: Diagnosis not present

## 2021-02-07 ENCOUNTER — Ambulatory Visit: Payer: Medicare Other | Admitting: Cardiovascular Disease

## 2021-02-19 DIAGNOSIS — Z23 Encounter for immunization: Secondary | ICD-10-CM | POA: Diagnosis not present

## 2021-04-14 ENCOUNTER — Other Ambulatory Visit: Payer: Self-pay | Admitting: Internal Medicine

## 2021-04-16 ENCOUNTER — Ambulatory Visit: Payer: Medicare Other | Admitting: Cardiovascular Disease

## 2021-04-17 DIAGNOSIS — M1712 Unilateral primary osteoarthritis, left knee: Secondary | ICD-10-CM | POA: Diagnosis not present

## 2021-05-22 DIAGNOSIS — M1712 Unilateral primary osteoarthritis, left knee: Secondary | ICD-10-CM | POA: Diagnosis not present

## 2021-05-22 DIAGNOSIS — M25562 Pain in left knee: Secondary | ICD-10-CM | POA: Diagnosis not present

## 2021-05-27 DIAGNOSIS — M65342 Trigger finger, left ring finger: Secondary | ICD-10-CM | POA: Diagnosis not present

## 2021-05-27 DIAGNOSIS — M79642 Pain in left hand: Secondary | ICD-10-CM | POA: Diagnosis not present

## 2021-06-05 DIAGNOSIS — M1712 Unilateral primary osteoarthritis, left knee: Secondary | ICD-10-CM | POA: Diagnosis not present

## 2021-06-12 DIAGNOSIS — M1712 Unilateral primary osteoarthritis, left knee: Secondary | ICD-10-CM | POA: Diagnosis not present

## 2021-07-22 ENCOUNTER — Telehealth: Payer: Self-pay

## 2021-07-22 ENCOUNTER — Other Ambulatory Visit: Payer: Self-pay | Admitting: Cardiovascular Disease

## 2021-07-22 NOTE — Telephone Encounter (Signed)
Left message for pt to call back  °

## 2021-07-23 ENCOUNTER — Other Ambulatory Visit: Payer: Self-pay

## 2021-07-23 ENCOUNTER — Ambulatory Visit (INDEPENDENT_AMBULATORY_CARE_PROVIDER_SITE_OTHER): Payer: Medicare Other | Admitting: Cardiovascular Disease

## 2021-07-23 ENCOUNTER — Encounter: Payer: Self-pay | Admitting: Cardiovascular Disease

## 2021-07-23 VITALS — BP 126/72 | HR 75 | Ht 75.0 in | Wt 315.0 lb

## 2021-07-23 DIAGNOSIS — I4510 Unspecified right bundle-branch block: Secondary | ICD-10-CM

## 2021-07-23 DIAGNOSIS — I4891 Unspecified atrial fibrillation: Secondary | ICD-10-CM | POA: Diagnosis not present

## 2021-07-23 DIAGNOSIS — G4733 Obstructive sleep apnea (adult) (pediatric): Secondary | ICD-10-CM

## 2021-07-23 DIAGNOSIS — I10 Essential (primary) hypertension: Secondary | ICD-10-CM

## 2021-07-23 DIAGNOSIS — E785 Hyperlipidemia, unspecified: Secondary | ICD-10-CM

## 2021-07-23 DIAGNOSIS — I214 Non-ST elevation (NSTEMI) myocardial infarction: Secondary | ICD-10-CM | POA: Diagnosis not present

## 2021-07-23 LAB — HEPATIC FUNCTION PANEL
ALT: 36 IU/L (ref 0–44)
AST: 18 IU/L (ref 0–40)
Albumin: 4.3 g/dL (ref 3.7–4.7)
Alkaline Phosphatase: 78 IU/L (ref 44–121)
Bilirubin Total: 0.5 mg/dL (ref 0.0–1.2)
Bilirubin, Direct: 0.14 mg/dL (ref 0.00–0.40)
Total Protein: 6.8 g/dL (ref 6.0–8.5)

## 2021-07-23 LAB — LIPID PANEL
Chol/HDL Ratio: 4.2 ratio (ref 0.0–5.0)
Cholesterol, Total: 155 mg/dL (ref 100–199)
HDL: 37 mg/dL — ABNORMAL LOW (ref >39)
LDL Chol Calc (NIH): 54 mg/dL (ref 0–99)
Triglycerides: 424 mg/dL — ABNORMAL HIGH (ref 0–149)
VLDL Cholesterol Cal: 64 mg/dL — ABNORMAL HIGH (ref 5–40)

## 2021-07-23 MED ORDER — ICOSAPENT ETHYL 1 G PO CAPS: 2.0000 g | ORAL_CAPSULE | Freq: Two times a day (BID) | ORAL | 3 refills | Status: DC

## 2021-07-23 NOTE — Assessment & Plan Note (Signed)
Ongoing alcohol abuse.  He drinks Chardonnay on a daily basis at dinner

## 2021-07-23 NOTE — Assessment & Plan Note (Signed)
History of obstructive sleep apnea.  He wears his CPAP 2 nights a week.

## 2021-07-23 NOTE — Assessment & Plan Note (Signed)
History of CAD status post non-STEMI 03/15/2017.  He underwent cardiac catheterization in [place] and was transferred to [facility] where he underwent CABG x6 by Dr. Servando Snare 03/22/2017.  His postop course was uncomplicated.  He denies chest pain or shortness of breath.

## 2021-07-23 NOTE — Assessment & Plan Note (Signed)
History of essential hypertension blood pressure measured today at 126/72.  He is on metoprolol, Benicar and hydrochlorothiazide.

## 2021-07-23 NOTE — Patient Instructions (Signed)
Medication Instructions:  Your physician recommends that you continue on your current medications as directed. Please refer to the Current Medication list given to you today.  *If you need a refill on your cardiac medications before your next appointment, please call your pharmacy*   Lab Work: Your physician recommends that you have labs drawn today: Lipid/ Liver profile.  If you have labs (blood work) drawn today and your tests are completely normal, you will receive your results only by: [place] (if you have MyChart) OR A paper copy in the mail If you have any lab test that is abnormal or we need to change your treatment, we will call you to review the results.   Follow-Up: At [facility], you and your health needs are our priority.  As part of our continuing mission to provide you with exceptional heart care, we have created designated Provider Care Teams.  These Care Teams include your primary Cardiologist (physician) and Advanced Practice Providers (APPs -  Physician Assistants and Nurse Practitioners) who all work together to provide you with the care you need, when you need it.  We recommend signing up for the patient portal called "MyChart".  Sign up information is provided on this After Visit Summary.  MyChart is used to connect with patients for Virtual Visits (Telemedicine).  Patients are able to view lab/test results, encounter notes, upcoming appointments, etc.  Non-urgent messages can be sent to your provider as well.   To learn more about what you can do with MyChart, go to [email or web address].    Your next appointment:   12 month(s)  The format for your next appointment:   In Person  Provider:   Quay Burow, MD

## 2021-07-23 NOTE — Assessment & Plan Note (Signed)
History of hyperlipidemia on atorvastatin and Vascepa with lipid profile performed 05/10/2020 revealing total cholesterol 147, LDL 50 and HDL of 40.  His triglyceride level at that time was 381.  He has since been put on Vascepa.  We will recheck a lipid liver profile today.

## 2021-07-23 NOTE — Progress Notes (Signed)
07/23/2021 David Frost   March 29, 1951  [id number]  Primary Physician [name], MD Primary Cardiologist: Lorretta Harp MD FACP, [place], [place], [place]  HPI:  David Frost is a 70 y.o.   severely overweight divorced Caucasian male father of 2 sons [name] and [name])  who I last saw saw in the office 06/12/2020. He also sees Dr. Debara Pickett in the lipid clinic as recently as last month.  Has a history of obesity, hypertension, chronic right bundle branch  block and symptoms compatible sleep apnea. He has obstructive sleep apnea on CPAP. He does admit to dietary indiscretion. He says that he loved bagels and salt. He had a Myoview stress test in 2007 which was low risk and a 2-D echo to and 10 that showed normal LV function. He had a non-STEMI in [place] 03/15/17 and was transferred to [facility] where he underwent cardiac catheterization revealing multivessel disease. He was then transferred to [facility] where he underwent coronary artery bypass grafting 6 by Dr. Servando Snare on 03/22/17. His postoperative course was uncomplicated. Soon after discharge she was remitted with A. fib with RVR from which he was symptomatic from and was rate controlled, placed on amiodarone and oral anticoagulation.   On 05/04/17 he underwent outpatient DC cardioversion by Dr. Oval Linsey successfully to sinus rhythm with one shock. He has done well since.  Unfortunately, he has not been able to lose weight over the last year.  He has not exercised either he does have sleep apnea but does not use his CPAP.  He sleeps in a chair.   He has been seen by Dr. Debara Pickett in the lipid clinic and is changing his diet.  His lipid profile has improved as have his triglycerides.  Continues to drink wine on a daily basis but does not drink caffeine.  He was seen in the ER in [place] this past Wednesday for hypotension, A. fib with RVR.  Apparently he was taking his olmesartan at twice the dose mistakenly.  He  was placed on IV infusion and converted in 4 hours.   Since I saw him in the office a year ago he continues to do well.  I had referred him to the [facility] diet and wellness center which he never pursued for weight loss.  He has had no recurrence of his A. fib.  He does wear an apple watch.  We talked about abstinence from alcohol however he drinks white wine on a daily basis.  He has limited his caffeine intake.  He denies chest pain or shortness of breath.   Current Meds  Medication Sig   acetaminophen (TYLENOL) 325 MG tablet Take 2 tablets (650 mg total) by mouth every 6 (six) hours as needed for mild pain.   ALPRAZolam (XANAX) 0.25 MG tablet Take 1 tablet (0.25 mg total) by mouth 2 (two) times daily as needed for anxiety.   aspirin EC 81 MG tablet Take 1 tablet (81 mg total) by mouth daily.   atorvastatin (LIPITOR) 40 MG tablet TAKE 1 TABLET(40 MG) BY MOUTH DAILY   bimatoprost (LUMIGAN) 0.01 % SOLN Lumigan 0.01 % eye drops  INSTILL 1 DROP INTO LEFT EYE EVERY NIGHT AT BEDTIME   brinzolamide (AZOPT) 1 % ophthalmic suspension 1 drop in both eye twice weekly   canagliflozin (INVOKANA) 300 MG TABS tablet Take 300 mg by mouth daily.   loratadine (CLARITIN) 10 MG tablet Take 10 mg by mouth daily.   metoprolol  succinate (TOPROL-XL) 50 MG 24 hr tablet Take 1 tablet (50 mg total) by mouth daily.   Multiple Vitamins-Minerals (PRESERVISION AREDS 2 PO) Take 1 capsule by mouth 2 (two) times daily.   nitroGLYCERIN (NITROSTAT) 0.4 MG SL tablet PLACE 1 TABLET UNDER THE TONGUE EVERY 5 MINUTES AS NEEDED FOR CHEST PAIN   olmesartan-hydrochlorothiazide (BENICAR HCT) 40-12.5 MG tablet Take 1 tablet by mouth every morning.   PROAIR RESPICLICK 678 (90 Base) MCG/ACT AEPB Take 2 puffs by mouth as directed.   Semaglutide, 1 MG/DOSE, (OZEMPIC, 1 MG/DOSE,) 2 MG/1.5ML SOPN Ozempic 1 mg/dose (2 mg/1.5 mL) subcutaneous pen injector  INJECT 1 MG UNDER THE SKIN ONCE A WEEK   [DISCONTINUED] icosapent Ethyl (VASCEPA) 1 g capsule  TAKE 2 CAPSULES(2 GRAMS) BY MOUTH TWICE DAILY     No Known Allergies  Social History   Socioeconomic History   Marital status: Divorced    Spouse name: Not on file   Number of children: Not on file   Years of education: Not on file   Highest education level: Not on file  Occupational History   Not on file  Tobacco Use   Smoking status: Former    Types: Cigars    Quit date: 03/08/2017    Years since quitting: 4.3   Smokeless tobacco: Never  Vaping Use   Vaping Use: Never used  Substance and Sexual Activity   Alcohol use: No    Alcohol/week: 70.0 standard drinks    Types: 70 Cans of beer per week    Comment: pt states not drinking since CABG   Drug use: No   Sexual activity: Not on file  Other Topics Concern   Not on file  Social History Narrative   Not on file   Social Determinants of Health   Financial Resource Strain: Not on file  Food Insecurity: Not on file  Transportation Needs: Not on file  Physical Activity: Not on file  Stress: Not on file  Social Connections: Not on file  Intimate Partner Violence: Not on file     Review of Systems: General: negative for chills, fever, night sweats or weight changes.  Cardiovascular: negative for chest pain, dyspnea on exertion, edema, orthopnea, palpitations, paroxysmal nocturnal dyspnea or shortness of breath Dermatological: negative for rash Respiratory: negative for cough or wheezing Urologic: negative for hematuria Abdominal: negative for nausea, vomiting, diarrhea, bright red blood per rectum, melena, or hematemesis Neurologic: negative for visual changes, syncope, or dizziness All other systems reviewed and are otherwise negative except as noted above.    Blood pressure 126/72, pulse 75, height 6\' 3"  (1.905 m), weight (!) 315 lb (142.9 kg).  General appearance: alert and no distress Neck: no adenopathy, no carotid bruit, no JVD, supple, symmetrical, trachea midline, and thyroid not enlarged, symmetric, no  tenderness/mass/nodules Lungs: clear to auscultation bilaterally Heart: regular rate and rhythm, S1, S2 normal, no murmur, click, rub or gallop Extremities: extremities normal, atraumatic, no cyanosis or edema Pulses: 2+ and symmetric Skin: Skin color, texture, turgor normal. No rashes or lesions Neurologic: Grossly normal  EKG sinus rhythm at 75 with right bundle branch block.  I personally reviewed this EKG.  ASSESSMENT AND PLAN:   OBESITY, UNSPECIFIED History of morbid obesity.  His weight is remained unchanged for the last year.  I did refer him to the [facility] diet and wellness center which he ultimately decided not to pursue.  Essential hypertension History of essential hypertension blood pressure measured today at 126/72.  He is on metoprolol,  Benicar and hydrochlorothiazide.  Obstructive sleep apnea History of obstructive sleep apnea.  He wears his CPAP 2 nights a week.  Right bundle branch block Chronic  Alcohol abuse Ongoing alcohol abuse.  He drinks Chardonnay on a daily basis at dinner  Atrial fibrillation with RVR ([place]) History of PAF in the past status post DC cardioversion maintaining sinus rhythm.  Patient currently is not on oral anticoagulant.  He has not had recurrence.  Hyperlipidemia LDL goal <70 History of hyperlipidemia on atorvastatin and Vascepa with lipid profile performed 05/10/2020 revealing total cholesterol 147, LDL 50 and HDL of 40.  His triglyceride level at that time was 381.  He has since been put on Vascepa.  We will recheck a lipid liver profile today.  NSTEMI (non-ST elevated myocardial infarction) ([place]) History of CAD status post non-STEMI 03/15/2017.  He underwent cardiac catheterization in [place] and was transferred to [facility] where he underwent CABG x6 by Dr. Servando Snare 03/22/2017.  His postop course was uncomplicated.  He denies chest pain or shortness of breath.     Lorretta Harp MD FACP,FACC,FAHA,  [facility] 07/23/2021 8:39 AM

## 2021-07-23 NOTE — Assessment & Plan Note (Signed)
History of morbid obesity.  His weight is remained unchanged for the last year.  I did refer him to the [facility] center which he ultimately decided not to pursue.

## 2021-07-23 NOTE — Assessment & Plan Note (Signed)
Chronic. 

## 2021-07-23 NOTE — Assessment & Plan Note (Signed)
History of PAF in the past status post DC cardioversion maintaining sinus rhythm.  Patient currently is not on oral anticoagulant.  He has not had recurrence.

## 2021-07-31 DIAGNOSIS — Z23 Encounter for immunization: Secondary | ICD-10-CM | POA: Diagnosis not present

## 2021-08-05 DIAGNOSIS — H353132 Nonexudative age-related macular degeneration, bilateral, intermediate dry stage: Secondary | ICD-10-CM | POA: Diagnosis not present

## 2021-08-05 DIAGNOSIS — H35373 Puckering of macula, bilateral: Secondary | ICD-10-CM | POA: Diagnosis not present

## 2021-08-05 DIAGNOSIS — E119 Type 2 diabetes mellitus without complications: Secondary | ICD-10-CM | POA: Diagnosis not present

## 2021-08-05 DIAGNOSIS — H35033 Hypertensive retinopathy, bilateral: Secondary | ICD-10-CM | POA: Diagnosis not present

## 2021-08-31 ENCOUNTER — Other Ambulatory Visit: Payer: Self-pay | Admitting: Cardiovascular Disease

## 2021-09-09 ENCOUNTER — Telehealth: Payer: Self-pay | Admitting: Cardiovascular Disease

## 2021-09-09 NOTE — Telephone Encounter (Signed)
David Frost is calling stating he has a philips aed defibrillator in his office which he thinks needs the pads replaced. He is wanting to know if there is someone we know of locally they can have come in to teach them a course to refresh them on how to use it. He states he reached out to [facility] and they were clueless. Please advise.

## 2021-09-09 NOTE — Telephone Encounter (Signed)
Returned pt's call. I looked up David Frost' customer service number for him and advised him they may be able to find someone locally to teach a class for him. He verbalized understanding and thanked me for calling.

## 2021-09-11 DIAGNOSIS — H5202 Hypermetropia, left eye: Secondary | ICD-10-CM | POA: Diagnosis not present

## 2021-09-11 DIAGNOSIS — H2512 Age-related nuclear cataract, left eye: Secondary | ICD-10-CM | POA: Diagnosis not present

## 2021-09-11 DIAGNOSIS — Z961 Presence of intraocular lens: Secondary | ICD-10-CM | POA: Diagnosis not present

## 2021-09-11 DIAGNOSIS — H401131 Primary open-angle glaucoma, bilateral, mild stage: Secondary | ICD-10-CM | POA: Diagnosis not present

## 2021-10-20 DIAGNOSIS — R0981 Nasal congestion: Secondary | ICD-10-CM | POA: Diagnosis not present

## 2021-10-20 DIAGNOSIS — Z1152 Encounter for screening for COVID-19: Secondary | ICD-10-CM | POA: Diagnosis not present

## 2021-10-20 DIAGNOSIS — J069 Acute upper respiratory infection, unspecified: Secondary | ICD-10-CM | POA: Diagnosis not present

## 2021-10-20 DIAGNOSIS — I480 Paroxysmal atrial fibrillation: Secondary | ICD-10-CM | POA: Diagnosis not present

## 2021-10-20 DIAGNOSIS — J029 Acute pharyngitis, unspecified: Secondary | ICD-10-CM | POA: Diagnosis not present

## 2021-10-20 DIAGNOSIS — I10 Essential (primary) hypertension: Secondary | ICD-10-CM | POA: Diagnosis not present

## 2021-10-20 DIAGNOSIS — R051 Acute cough: Secondary | ICD-10-CM | POA: Diagnosis not present

## 2021-12-18 DIAGNOSIS — I87319 Chronic venous hypertension (idiopathic) with ulcer of unspecified lower extremity: Secondary | ICD-10-CM | POA: Diagnosis not present

## 2021-12-18 DIAGNOSIS — G4733 Obstructive sleep apnea (adult) (pediatric): Secondary | ICD-10-CM | POA: Diagnosis not present

## 2021-12-18 DIAGNOSIS — I25810 Atherosclerosis of coronary artery bypass graft(s) without angina pectoris: Secondary | ICD-10-CM | POA: Diagnosis not present

## 2021-12-18 DIAGNOSIS — I739 Peripheral vascular disease, unspecified: Secondary | ICD-10-CM | POA: Diagnosis not present

## 2021-12-18 DIAGNOSIS — I10 Essential (primary) hypertension: Secondary | ICD-10-CM | POA: Diagnosis not present

## 2022-01-27 DIAGNOSIS — E782 Mixed hyperlipidemia: Secondary | ICD-10-CM | POA: Diagnosis not present

## 2022-01-27 DIAGNOSIS — Z79899 Other long term (current) drug therapy: Secondary | ICD-10-CM | POA: Diagnosis not present

## 2022-01-27 DIAGNOSIS — E1151 Type 2 diabetes mellitus with diabetic peripheral angiopathy without gangrene: Secondary | ICD-10-CM | POA: Diagnosis not present

## 2022-01-27 DIAGNOSIS — I10 Essential (primary) hypertension: Secondary | ICD-10-CM | POA: Diagnosis not present

## 2022-02-13 DIAGNOSIS — I87339 Chronic venous hypertension (idiopathic) with ulcer and inflammation of unspecified lower extremity: Secondary | ICD-10-CM | POA: Diagnosis not present

## 2022-02-13 DIAGNOSIS — I872 Venous insufficiency (chronic) (peripheral): Secondary | ICD-10-CM | POA: Diagnosis not present

## 2022-02-13 DIAGNOSIS — G4733 Obstructive sleep apnea (adult) (pediatric): Secondary | ICD-10-CM | POA: Diagnosis not present

## 2022-02-13 DIAGNOSIS — I25810 Atherosclerosis of coronary artery bypass graft(s) without angina pectoris: Secondary | ICD-10-CM | POA: Diagnosis not present

## 2022-02-13 DIAGNOSIS — R82998 Other abnormal findings in urine: Secondary | ICD-10-CM | POA: Diagnosis not present

## 2022-02-13 DIAGNOSIS — I739 Peripheral vascular disease, unspecified: Secondary | ICD-10-CM | POA: Diagnosis not present

## 2022-02-13 DIAGNOSIS — I10 Essential (primary) hypertension: Secondary | ICD-10-CM | POA: Diagnosis not present

## 2022-02-13 DIAGNOSIS — Z0000 Encounter for general adult medical examination without abnormal findings: Secondary | ICD-10-CM | POA: Diagnosis not present

## 2022-02-13 DIAGNOSIS — E1151 Type 2 diabetes mellitus with diabetic peripheral angiopathy without gangrene: Secondary | ICD-10-CM | POA: Diagnosis not present

## 2022-02-13 DIAGNOSIS — I480 Paroxysmal atrial fibrillation: Secondary | ICD-10-CM | POA: Diagnosis not present

## 2022-02-13 DIAGNOSIS — E782 Mixed hyperlipidemia: Secondary | ICD-10-CM | POA: Diagnosis not present

## 2022-02-13 DIAGNOSIS — Z1339 Encounter for screening examination for other mental health and behavioral disorders: Secondary | ICD-10-CM | POA: Diagnosis not present

## 2022-02-13 DIAGNOSIS — Z1331 Encounter for screening for depression: Secondary | ICD-10-CM | POA: Diagnosis not present

## 2022-02-18 DIAGNOSIS — M1712 Unilateral primary osteoarthritis, left knee: Secondary | ICD-10-CM | POA: Diagnosis not present

## 2022-02-26 DIAGNOSIS — M1712 Unilateral primary osteoarthritis, left knee: Secondary | ICD-10-CM | POA: Diagnosis not present

## 2022-03-16 DIAGNOSIS — I739 Peripheral vascular disease, unspecified: Secondary | ICD-10-CM | POA: Diagnosis not present

## 2022-03-16 DIAGNOSIS — L97919 Non-pressure chronic ulcer of unspecified part of right lower leg with unspecified severity: Secondary | ICD-10-CM | POA: Diagnosis not present

## 2022-03-16 DIAGNOSIS — I87311 Chronic venous hypertension (idiopathic) with ulcer of right lower extremity: Secondary | ICD-10-CM | POA: Diagnosis not present

## 2022-03-20 DIAGNOSIS — Z1212 Encounter for screening for malignant neoplasm of rectum: Secondary | ICD-10-CM | POA: Diagnosis not present

## 2022-03-20 DIAGNOSIS — Z1211 Encounter for screening for malignant neoplasm of colon: Secondary | ICD-10-CM | POA: Diagnosis not present

## 2022-04-03 DIAGNOSIS — M170 Bilateral primary osteoarthritis of knee: Secondary | ICD-10-CM | POA: Diagnosis not present

## 2022-04-16 DIAGNOSIS — M1711 Unilateral primary osteoarthritis, right knee: Secondary | ICD-10-CM | POA: Diagnosis not present

## 2022-04-23 DIAGNOSIS — M1711 Unilateral primary osteoarthritis, right knee: Secondary | ICD-10-CM | POA: Diagnosis not present

## 2022-04-26 ENCOUNTER — Encounter (HOSPITAL_COMMUNITY): Payer: Self-pay | Admitting: Emergency Medicine

## 2022-04-26 ENCOUNTER — Other Ambulatory Visit: Payer: Self-pay

## 2022-04-26 ENCOUNTER — Emergency Department (HOSPITAL_COMMUNITY)
Admission: EM | Admit: 2022-04-26 | Discharge: 2022-04-26 | Disposition: A | Discharge status: Home / Self Care | Payer: Medicare Other | Attending: Emergency Medicine | Admitting: Emergency Medicine

## 2022-04-26 DIAGNOSIS — K5641 Fecal impaction: Secondary | ICD-10-CM | POA: Insufficient documentation

## 2022-04-26 DIAGNOSIS — K5900 Constipation, unspecified: Secondary | ICD-10-CM | POA: Diagnosis present

## 2022-04-26 DIAGNOSIS — Z794 Long term (current) use of insulin: Secondary | ICD-10-CM | POA: Insufficient documentation

## 2022-04-26 DIAGNOSIS — Z7982 Long term (current) use of aspirin: Secondary | ICD-10-CM | POA: Diagnosis not present

## 2022-04-26 MED ORDER — FLEET ENEMA 7-19 GM/118ML RE ENEM
1.0000 | ENEMA | Freq: Once | RECTAL | Status: AC
  Administered 2022-04-26: 1 via RECTAL
  Filled 2022-04-26: qty 1

## 2022-04-26 NOTE — ED Triage Notes (Signed)
Pt came in from home due to constipation.  Pt has tried 3 fleet enemas and prune juice with no results.  Pt states that this is not common for him and that he "goes every morning".  Pt states that his abdomen is more distended than normal.

## 2022-04-26 NOTE — Discharge Instructions (Signed)
Take 8 scoops of miralax in 32oz of whatever you would like to drink.(Gatorade comes in this size) You can also use a fleets enema which you can buy over the counter at the pharmacy.  Return for worsening abdominal pain, vomiting or fever. ? ?

## 2022-04-26 NOTE — ED Provider Notes (Signed)
[facility] EMERGENCY DEPARTMENT Provider Note   CSN: [id number] Arrival date & time: 04/26/22  0841     History  Chief Complaint  Patient presents with   Constipation    David Frost is a 71 y.o. male.  71 yo M with a chief complaints of difficulty moving his bowels.  Tells me that for the past 3 days he has had some worsening difficulty.  Typically has some issue having a bowel movements and has been doing prune juice every morning for some time.  Today he has been unable to move his bowels at all and has tried 3 enemas and prune juice without improvement.  Feels like his abdomen has been a bit more distended than normal.  No history of abdominal surgery.  No nausea or vomiting.  No fevers.   Constipation      Home Medications Prior to Admission medications   Medication Sig Start Date End Date Taking? Authorizing Provider  acetaminophen (TYLENOL) 325 MG tablet Take 2 tablets (650 mg total) by mouth every 6 (six) hours as needed for mild pain. 03/27/17   Barrett, Erin R, PA-C  ALPRAZolam (XANAX) 0.25 MG tablet Take 1 tablet (0.25 mg total) by mouth 2 (two) times daily as needed for anxiety. 03/27/17   Barrett, Lodema Hong, PA-C  aspirin EC 81 MG tablet Take 1 tablet (81 mg total) by mouth daily. 09/23/17   Barrett, Evelene Croon, PA-C  atorvastatin (LIPITOR) 40 MG tablet TAKE 1 TABLET(40 MG) BY MOUTH DAILY 09/01/21   Lorretta Harp, MD  bimatoprost (LUMIGAN) 0.01 % SOLN Lumigan 0.01 % eye drops  INSTILL 1 DROP INTO LEFT EYE EVERY NIGHT AT BEDTIME    [provider]  brinzolamide (AZOPT) 1 % ophthalmic suspension 1 drop in both eye twice weekly    [provider]  canagliflozin (INVOKANA) 300 MG TABS tablet Take 300 mg by mouth daily.    [provider]  icosapent Ethyl (VASCEPA) 1 g capsule Take 2 capsules (2 g total) by mouth 2 (two) times daily. 07/23/21   Lorretta Harp, MD  loratadine (CLARITIN) 10 MG tablet Take 10 mg by mouth daily.     [provider]  metoprolol succinate (TOPROL-XL) 50 MG 24 hr tablet Take 1 tablet (50 mg total) by mouth daily. 09/06/19   Lorretta Harp, MD  Multiple Vitamins-Minerals (PRESERVISION AREDS 2 PO) Take 1 capsule by mouth 2 (two) times daily.    [provider]  nitroGLYCERIN (NITROSTAT) 0.4 MG SL tablet PLACE 1 TABLET UNDER THE TONGUE EVERY 5 MINUTES AS NEEDED FOR CHEST PAIN 02/26/20   Lorretta Harp, MD  olmesartan-hydrochlorothiazide (BENICAR HCT) 40-12.5 MG tablet Take 1 tablet by mouth every morning. 02/15/20   [provider]  PROAIR RESPICLICK 315 (90 Base) MCG/ACT AEPB Take 2 puffs by mouth as directed. 02/24/17   [provider]  Semaglutide, 1 MG/DOSE, (OZEMPIC, 1 MG/DOSE,) 2 MG/1.5ML SOPN Ozempic 1 mg/dose (2 mg/1.5 mL) subcutaneous pen injector  INJECT 1 MG UNDER THE SKIN ONCE A WEEK    [provider]      Allergies    Patient has no known allergies.    Review of Systems   Review of Systems  Gastrointestinal:  Positive for constipation.    Physical Exam Updated Vital Signs BP (!) 147/77 (BP Location: Right Arm)   Pulse 83   Temp 97.6 F (36.4 C) (Oral)   Resp (!) 24   Ht '6\' 2"'$  (1.88  m)   Wt (!) 140.6 kg   SpO2 96%   BMI 39.80 kg/m  Physical Exam Vitals and nursing note reviewed.  Constitutional:      Appearance: He is well-developed.  HENT:     Head: Normocephalic and atraumatic.  Eyes:     Pupils: Pupils are equal, round, and reactive to light.  Neck:     Vascular: No JVD.  Cardiovascular:     Rate and Rhythm: Normal rate and regular rhythm.     Heart sounds: No murmur heard.    No friction rub. No gallop.  Pulmonary:     Effort: No respiratory distress.     Breath sounds: No wheezing.  Abdominal:     General: There is no distension.     Tenderness: There is no abdominal tenderness. There is no guarding or rebound.     Comments: Distended per the patient.  No appreciable abdominal discomfort.   Musculoskeletal:        General: Normal range of motion.     Cervical back: Normal range of motion and neck supple.  Skin:    Coloration: Skin is not pale.     Findings: No rash.  Neurological:     Mental Status: He is alert and oriented to person, place, and time.  Psychiatric:        Behavior: Behavior normal.     ED Results / Procedures / Treatments   Labs (all labs ordered are listed, but only abnormal results are displayed) Labs Reviewed - No data to display  EKG None  Radiology No results found.  Procedures Fecal disimpaction  Date/Time: 04/26/2022 10:07 AM  Performed by: Deno Etienne, DO Authorized by: Deno Etienne, DO  Consent: Verbal consent obtained. Risks and benefits: risks, benefits and alternatives were discussed Consent given by: patient Patient understanding: patient states understanding of the procedure being performed Patient consent: the patient's understanding of the procedure matches consent given Patient identity confirmed: verbally with patient Time out: Immediately prior to procedure a "time out" was called to verify the correct patient, procedure, equipment, support staff and site/side marked as required. Local anesthesia used: no  Anesthesia: Local anesthesia used: no  Sedation: Patient sedated: no  Patient tolerance: patient tolerated the procedure well with no immediate complications       Medications Ordered in ED Medications  sodium phosphate (FLEET) 7-19 GM/118ML enema 1 enema (1 enema Rectal Given 04/26/22 0921)    ED Course/ Medical Decision Making/ A&P                           Medical Decision Making Risk OTC drugs.   71 yo M with a chief complaints of constipation.  The patient tells me that he has had difficulty moving his bowels.  This been going on for some time but worsening over the past 3 days.  He tried 3 enemas this morning and prune juice without improvement.  He tells me that he needs some relief.  He is  well-appearing and nontoxic.  Has a benign abdominal exam.  Triage vitals with tachypnea but no appreciable tachypnea on my exam.  I discussed different modalities of evaluation including labs and CT imaging.  I discussed the rectal exam with fecal disimpaction.  Patient is requesting a trial of an enema first.  After talking with him about the procedure it sounds like he has not been using the enema properly at home.  We will attempt here.  No significant output with initial enema.  Rectal exam with hard clay consistency stool in the rectum.  Disimpacted up to the length of my finger.  Patient will go home and attempt a MiraLAX cleanout.  PCP follow-up.  10:08 AM:  I have discussed the diagnosis/risks/treatment options with the patient and family.  Evaluation and diagnostic testing in the emergency department does not suggest an emergent condition requiring admission or immediate intervention beyond what has been performed at this time.  They will follow up with  PCP. We also discussed returning to the ED immediately if new or worsening sx occur. We discussed the sx which are most concerning (e.g., sudden worsening pain, fever, inability to tolerate by mouth) that necessitate immediate return. Medications administered to the patient during their visit and any new prescriptions provided to the patient are listed below.  Medications given during this visit Medications  sodium phosphate (FLEET) 7-19 GM/118ML enema 1 enema (1 enema Rectal Given 04/26/22 0921)     The patient appears reasonably screen and/or stabilized for discharge and I doubt any other medical condition or other [facility] requiring further screening, evaluation, or treatment in the ED at this time prior to discharge.            Final Clinical Impression(s) / ED Diagnoses Final diagnoses:  Fecal impaction in rectum [facility])    Rx / DC Orders ED Discharge Orders     None         Deno Etienne, DO 04/26/22 1008

## 2022-04-26 NOTE — ED Notes (Signed)
Pt verbalizes understanding of discharge instructions. Opportunity for questions and answers were provided. Pt discharged from the ED.   ?

## 2022-04-26 NOTE — ED Notes (Signed)
Pt reports no relief from enema.

## 2022-04-29 ENCOUNTER — Encounter (INDEPENDENT_AMBULATORY_CARE_PROVIDER_SITE_OTHER): Payer: Self-pay

## 2022-04-30 DIAGNOSIS — M1711 Unilateral primary osteoarthritis, right knee: Secondary | ICD-10-CM | POA: Diagnosis not present

## 2022-05-13 DIAGNOSIS — Z0289 Encounter for other administrative examinations: Secondary | ICD-10-CM

## 2022-05-19 ENCOUNTER — Encounter (INDEPENDENT_AMBULATORY_CARE_PROVIDER_SITE_OTHER): Payer: Self-pay | Admitting: Family Medicine

## 2022-05-19 ENCOUNTER — Ambulatory Visit (INDEPENDENT_AMBULATORY_CARE_PROVIDER_SITE_OTHER): Payer: Medicare Other | Admitting: Family Medicine

## 2022-05-19 VITALS — BP 142/76 | HR 62 | Temp 97.5°F | Ht 71.0 in | Wt 319.0 lb

## 2022-05-19 DIAGNOSIS — R5383 Other fatigue: Secondary | ICD-10-CM

## 2022-05-19 DIAGNOSIS — E1169 Type 2 diabetes mellitus with other specified complication: Secondary | ICD-10-CM | POA: Diagnosis not present

## 2022-05-19 DIAGNOSIS — E1165 Type 2 diabetes mellitus with hyperglycemia: Secondary | ICD-10-CM

## 2022-05-19 DIAGNOSIS — E1159 Type 2 diabetes mellitus with other circulatory complications: Secondary | ICD-10-CM | POA: Diagnosis not present

## 2022-05-19 DIAGNOSIS — G4733 Obstructive sleep apnea (adult) (pediatric): Secondary | ICD-10-CM

## 2022-05-19 DIAGNOSIS — R0602 Shortness of breath: Secondary | ICD-10-CM | POA: Diagnosis not present

## 2022-05-19 DIAGNOSIS — I152 Hypertension secondary to endocrine disorders: Secondary | ICD-10-CM

## 2022-05-19 DIAGNOSIS — F1010 Alcohol abuse, uncomplicated: Secondary | ICD-10-CM

## 2022-05-19 DIAGNOSIS — Z1331 Encounter for screening for depression: Secondary | ICD-10-CM | POA: Diagnosis not present

## 2022-05-19 DIAGNOSIS — Z951 Presence of aortocoronary bypass graft: Secondary | ICD-10-CM

## 2022-05-19 DIAGNOSIS — E785 Hyperlipidemia, unspecified: Secondary | ICD-10-CM | POA: Diagnosis not present

## 2022-05-19 DIAGNOSIS — Z6841 Body Mass Index (BMI) 40.0 and over, adult: Secondary | ICD-10-CM

## 2022-05-19 DIAGNOSIS — Z7985 Long-term (current) use of injectable non-insulin antidiabetic drugs: Secondary | ICD-10-CM

## 2022-05-19 DIAGNOSIS — E559 Vitamin D deficiency, unspecified: Secondary | ICD-10-CM

## 2022-05-19 DIAGNOSIS — Z7984 Long term (current) use of oral hypoglycemic drugs: Secondary | ICD-10-CM

## 2022-05-20 LAB — CBC WITH DIFFERENTIAL/PLATELET
Basophils Absolute: 0 10*3/uL (ref 0.0–0.2)
Basos: 1 %
EOS (ABSOLUTE): 0.2 10*3/uL (ref 0.0–0.4)
Eos: 3 %
Hematocrit: 43.2 % (ref 37.5–51.0)
Hemoglobin: 14.6 g/dL (ref 13.0–17.7)
Immature Grans (Abs): 0 10*3/uL (ref 0.0–0.1)
Immature Granulocytes: 0 %
Lymphocytes Absolute: 3 10*3/uL (ref 0.7–3.1)
Lymphs: 38 %
MCH: 30.3 pg (ref 26.6–33.0)
MCHC: 33.8 g/dL (ref 31.5–35.7)
MCV: 90 fL (ref 79–97)
Monocytes Absolute: 0.8 10*3/uL (ref 0.1–0.9)
Monocytes: 10 %
Neutrophils Absolute: 3.7 10*3/uL (ref 1.4–7.0)
Neutrophils: 48 %
Platelets: 185 10*3/uL (ref 150–450)
RBC: 4.82 x10E6/uL (ref 4.14–5.80)
RDW: 13.3 % (ref 11.6–15.4)
WBC: 7.8 10*3/uL (ref 3.4–10.8)

## 2022-05-20 LAB — COMPREHENSIVE METABOLIC PANEL
ALT: 31 IU/L (ref 0–44)
AST: 13 IU/L (ref 0–40)
Albumin/Globulin Ratio: 1.7 (ref 1.2–2.2)
Albumin: 4.2 g/dL (ref 3.8–4.8)
Alkaline Phosphatase: 97 IU/L (ref 44–121)
BUN/Creatinine Ratio: 23 (ref 10–24)
BUN: 22 mg/dL (ref 8–27)
Bilirubin Total: 0.7 mg/dL (ref 0.0–1.2)
CO2: 24 mmol/L (ref 20–29)
Calcium: 9.1 mg/dL (ref 8.6–10.2)
Chloride: 96 mmol/L (ref 96–106)
Creatinine, Ser: 0.94 mg/dL (ref 0.76–1.27)
Globulin, Total: 2.5 g/dL (ref 1.5–4.5)
Glucose: 122 mg/dL — ABNORMAL HIGH (ref 70–99)
Potassium: 4.4 mmol/L (ref 3.5–5.2)
Sodium: 135 mmol/L (ref 134–144)
Total Protein: 6.7 g/dL (ref 6.0–8.5)
eGFR: 86 mL/min/{1.73_m2} (ref >59)

## 2022-05-20 LAB — T3: T3, Total: 111 ng/dL (ref 71–180)

## 2022-05-20 LAB — INSULIN, RANDOM: INSULIN: 28.2 u[IU]/mL — ABNORMAL HIGH (ref 2.6–24.9)

## 2022-05-20 LAB — LIPID PANEL WITH LDL/HDL RATIO
Cholesterol, Total: 145 mg/dL (ref 100–199)
HDL: 35 mg/dL — ABNORMAL LOW (ref >39)
LDL Chol Calc (NIH): 55 mg/dL (ref 0–99)
LDL/HDL Ratio: 1.6 ratio (ref 0.0–3.6)
Triglycerides: 361 mg/dL — ABNORMAL HIGH (ref 0–149)
VLDL Cholesterol Cal: 55 mg/dL — ABNORMAL HIGH (ref 5–40)

## 2022-05-20 LAB — VITAMIN D 25 HYDROXY (VIT D DEFICIENCY, FRACTURES): Vit D, 25-Hydroxy: 22.7 ng/mL — ABNORMAL LOW (ref 30.0–100.0)

## 2022-05-20 LAB — HEMOGLOBIN A1C
Est. average glucose Bld gHb Est-mCnc: 151 mg/dL
Hgb A1c MFr Bld: 6.9 % — ABNORMAL HIGH (ref 4.8–5.6)

## 2022-05-20 LAB — TSH: TSH: 1.22 u[IU]/mL (ref 0.450–4.500)

## 2022-05-20 LAB — T4, FREE: Free T4: 1.19 ng/dL (ref 0.82–1.77)

## 2022-05-25 NOTE — Progress Notes (Signed)
Chief Complaint:   OBESITY David Frost (MR# [id number]) is a 71 y.o. male who presents for evaluation and treatment of obesity and related comorbidities. Current BMI is Body mass index is 44.49 kg/m. David Frost has been struggling with his weight for many years and has been unsuccessful in either losing weight, maintaining weight loss, or reaching his healthy weight goal.  David Frost is currently in the action stage of change and ready to dedicate time achieving and maintaining a healthier weight. David Frost is interested in becoming our patient and working on intensive lifestyle modifications including (but not limited to) diet and exercise for weight loss.  Chamar is referred here by [facility]. He needs a bilateral replacement. David Frost voices that he has done all different meal plans. He drinks 1 to 2 bottles of wine more nights than not. At 6:00am, he goes to [employer] and gets 3 scrambled eggs, 3 pieces of bacon, and decaf coffee. He feels satisfied. At 10:00am, David Frost eats a can of tuna fish or tomatoes. Lunch is hit or miss. It may be [employer] salad bar with spinach, red pepper, tomatoes, and mushrooms or corned beef, bread, and water. Dinner is [employer], vegetables, and rice.  David Frost's habits were reviewed today and are as follows: His family eats meals together, he thinks his family will eat healthier with him, his desired weight loss is 69 pounds, he started gaining weight 20 years ago, his heaviest weight ever was 324 pounds, he is frequently drinking liquids with calories, he frequently makes poor food choices, he has problems with excessive hunger, and he sometimes eats larger portions than normal.  Depression Screen Moriah's Food and Mood (modified PHQ-9) score was 22.     05/19/2022   11:29 AM  Depression screen PHQ 2/9  Decreased Interest 3  Down, Depressed, Hopeless 3  PHQ - 2 Score 6  Altered sleeping 3  Tired, decreased energy 3  Change  in appetite 3  Feeling bad or failure about yourself  2  Trouble concentrating 2  Moving slowly or fidgety/restless 3  Suicidal thoughts 0  PHQ-9 Score 22  Difficult doing work/chores Very difficult   Subjective:   1. Other fatigue David Frost's EKG showed a Right bundle branch block which is the same as his previous EKG. David Frost admits to daytime somnolence and admits to waking up still tired. Patient has a history of symptoms of Epworth sleepiness scale and hypertension. David Frost generally gets 4 hours of sleep per night, and states that he has generally restless sleep. Snoring is present. Apneic episodes are not present. Epworth Sleepiness Score is 19.    2. SOBOE (shortness of breath on exertion) David Frost notes increasing shortness of breath with exercising and seems to be worsening over time with weight gain. He notes getting out of breath sooner with activity than he used to. This has not gotten worse recently. Benzion denies shortness of breath at rest or orthopnea.   3. Type 2 diabetes mellitus with hyperglycemia, without long-term current use of insulin (HCC) Lanier was previously on Invokana, but is now on Jardiance and Ozempic '1mg'$ .  4. Hypertension associated with diabetes (David Frost) David Frost started medication 6 years ago and he is on olmesartan-HCTZ currently. His blood pressure is slightly elevated today.  5. Hyperlipidemia associated with type 2 diabetes mellitus (David Frost) David Frost's LDL is 50, HDL is 40, and his Triglycerides are 381. He has a history of CABG and is on a statin.  6. 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 He does not have any real symptoms.  7. OSA (obstructive sleep apnea) Garrison has a CPAP that he wears nightly. He feels better with the CPAP.  8. Vitamin D deficiency David Frost notes fatigue and is not on Vitamin D supplementation.  9. Alcohol abuse David Frost drinks 1 to 2 bottles of wine per night. He voices negative to all 4 CAGE  questions.  Assessment/Plan:   1. Other fatigue Jermond does feel that his weight is causing his energy to be lower than it should be. Fatigue may be related to obesity, depression or many other causes. Labs will be ordered, and in the meanwhile, Loney will focus on self care including making healthy food choices, increasing physical activity and focusing on stress reduction.  - EKG 12-Lead - CBC with Differential/Platelet - T3 - T4, free - TSH  2. SOBOE (shortness of breath on exertion) Ladarren does feel that he gets out of breath more easily that he used to when he exercises. David Frost's shortness of breath appears to be obesity related and exercise induced. He has agreed to work on weight loss and gradually increase exercise to treat his exercise induced shortness of breath. Will continue to monitor closely.  3. Type 2 diabetes mellitus with hyperglycemia, without long-term current use of insulin (David Frost) Labs were obtained today and Comer agrees to follow up as directed.  - Hemoglobin A1c - Insulin, random  4. Hypertension associated with diabetes ([place]) Labs were drawn today and will be discussed at the next office visit.   - Comprehensive metabolic panel  5. Hyperlipidemia associated with type 2 diabetes mellitus (David Frost) Labs were ordered and he agrees to follow up at the agreed upon time  - Lipid Panel With LDL/HDL Ratio  6. Hx of CABG David Frost agrees to follow up with Dr Gwenlyn Found.  7. OSA (obstructive sleep apnea) We will follow up on compliance at his next appointment.  8. Vitamin D deficiency Labs were drawn today and he agrees to follow up as directed.  - VITAMIN D 25 Hydroxy (Vit-D Deficiency, Fractures)  9. Alcohol abuse We will follow up on Hughes's intake at the next appointment.  10. Depression screening David Frost had a positive depression screening. Depression is commonly associated with obesity and often results in emotional eating behaviors. We will monitor this  closely and work on CBT to help improve the non-hunger eating patterns. Referral to Psychology may be required if no improvement is seen as he continues in our clinic.  11. Class 3 severe obesity with serious comorbidity and body mass index (BMI) of 40.0 to 44.9 in adult, unspecified obesity type (HCC) David Frost is currently in the action stage of change and his goal is to continue with weight loss efforts. I recommend David Frost begin the structured treatment plan as follows:  He has agreed to the Category 4 Plan.  Exercise goals: No exercise has been prescribed at this time.   Behavioral modification strategies: increasing lean protein intake, meal planning and cooking strategies, and keeping healthy foods in the home.  He was informed of the importance of frequent follow-up visits to maximize his success with intensive lifestyle modifications for his multiple health conditions. He was informed we would discuss his lab results at his next visit unless there is a critical issue that needs to be addressed sooner. David Frost agreed to keep his next visit at the agreed upon time to discuss these results.  Objective:  Blood pressure (!) 142/76, pulse 62, temperature (!) 97.5 F (36.4 C), height '5\' 11"'$  (1.803 m), weight (!) 319 lb (144.7 kg), SpO2 95 %. Body mass index is 44.49 kg/m.  EKG: Right bundle branch block, rate 63.  Indirect Calorimeter completed today shows a VO2 of 347 and a REE of 2390.  His calculated basal metabolic rate is 1751 thus his basal metabolic rate is worse than expected.  General: Cooperative, alert, well developed, in no acute distress. HEENT: Conjunctivae and lids unremarkable. Cardiovascular: Regular rhythm.  Lungs: Normal work of breathing. Neurologic: No focal deficits.   Lab Results  Component Value Date   CREATININE 0.94 05/19/2022   BUN 22 05/19/2022   NA 135 05/19/2022   K 4.4 05/19/2022   CL 96 05/19/2022   CO2 24 05/19/2022   Lab Results  Component Value  Date   ALT 31 05/19/2022   AST 13 05/19/2022   ALKPHOS 97 05/19/2022   BILITOT 0.7 05/19/2022   Lab Results  Component Value Date   HGBA1C 6.9 (H) 05/19/2022   HGBA1C 6.0 (H) 03/18/2017   Lab Results  Component Value Date   INSULIN 28.2 (H) 05/19/2022   Lab Results  Component Value Date   TSH 1.220 05/19/2022   Lab Results  Component Value Date   CHOL 145 05/19/2022   HDL 35 (L) 05/19/2022   LDLCALC 55 05/19/2022   TRIG 361 (H) 05/19/2022   CHOLHDL 4.2 07/23/2021   Lab Results  Component Value Date   WBC 7.8 05/19/2022   HGB 14.6 05/19/2022   HCT 43.2 05/19/2022   MCV 90 05/19/2022   PLT 185 05/19/2022   No results found for: "IRON", "TIBC", "FERRITIN" Obesity Behavioral Intervention:   Approximately 15 minutes were spent on the discussion below.  ASK: We discussed the diagnosis of obesity with David Frost today and [place] agreed to give [place] permission to discuss obesity behavioral modification therapy today.  ASSESS: Yaasir has the diagnosis of obesity and his BMI today is 44.6. David Frost is in the action stage of change.   ADVISE: Samel was educated on the multiple health risks of obesity as well as the benefit of weight loss to improve his health. He was advised of the need for long term treatment and the importance of lifestyle modifications to improve his current health and to decrease his risk of future health problems.  AGREE: Multiple dietary modification options and treatment options were discussed and David Frost agreed to follow the recommendations documented in the above note.  ARRANGE: Kallon was educated on the importance of frequent visits to treat obesity as outlined per CMS and USPSTF guidelines and agreed to schedule his next follow up appointment today.  Attestation Statements:   Reviewed by clinician on day of visit: allergies, medications, problem list, medical history, surgical history, family history, social history, and previous encounter  notes.  Lenward Chancellor, CMA, am acting as transcriptionist for Coralie Common, MD This is the patient's first visit at [facility]. The patient's NEW PATIENT PACKET was reviewed at length. Included in the packet: current and past health history, medications, allergies, ROS, gynecologic history (women only), surgical history, family history, social history, weight history, weight loss surgery history (for those that have had weight loss surgery), nutritional evaluation, mood and food questionnaire, PHQ9, Epworth questionnaire, sleep habits questionnaire, patient life and health improvement goals questionnaire. These will all be scanned into the patient's chart under media.   During the visit, I independently reviewed the patient's EKG,  bioimpedance scale results, and indirect calorimeter results. I used this information to tailor a meal plan for the patient that will help him to lose weight and will improve his obesity-related conditions going forward. I performed a medically necessary appropriate examination and/or evaluation. I discussed the assessment and treatment plan with the patient. The patient was provided an opportunity to ask questions and all were answered. The patient agreed with the plan and demonstrated an understanding of the instructions. Labs were ordered at this visit and will be reviewed at the next visit unless more critical results need to be addressed immediately. Clinical information was updated and documented in the EMR.   Time spent on visit including pre-visit chart review and post-visit care was 40 minutes.    I have reviewed the above documentation for accuracy and completeness, and I agree with the above. - Coralie Common, MD

## 2022-05-27 ENCOUNTER — Emergency Department (HOSPITAL_BASED_OUTPATIENT_CLINIC_OR_DEPARTMENT_OTHER)
Admission: EM | Admit: 2022-05-27 | Discharge: 2022-05-27 | Disposition: A | Discharge status: Home / Self Care | Payer: Medicare Other | Attending: Emergency Medicine | Admitting: Emergency Medicine

## 2022-05-27 ENCOUNTER — Emergency Department (HOSPITAL_BASED_OUTPATIENT_CLINIC_OR_DEPARTMENT_OTHER): Payer: Medicare Other

## 2022-05-27 ENCOUNTER — Encounter (HOSPITAL_BASED_OUTPATIENT_CLINIC_OR_DEPARTMENT_OTHER): Payer: Self-pay

## 2022-05-27 DIAGNOSIS — I2510 Atherosclerotic heart disease of native coronary artery without angina pectoris: Secondary | ICD-10-CM | POA: Diagnosis not present

## 2022-05-27 DIAGNOSIS — E119 Type 2 diabetes mellitus without complications: Secondary | ICD-10-CM | POA: Diagnosis not present

## 2022-05-27 DIAGNOSIS — R000 Tachycardia, unspecified: Secondary | ICD-10-CM | POA: Diagnosis not present

## 2022-05-27 DIAGNOSIS — Z20822 Contact with and (suspected) exposure to covid-19: Secondary | ICD-10-CM | POA: Diagnosis not present

## 2022-05-27 DIAGNOSIS — R079 Chest pain, unspecified: Secondary | ICD-10-CM | POA: Diagnosis not present

## 2022-05-27 DIAGNOSIS — Z7982 Long term (current) use of aspirin: Secondary | ICD-10-CM | POA: Diagnosis not present

## 2022-05-27 DIAGNOSIS — I10 Essential (primary) hypertension: Secondary | ICD-10-CM | POA: Insufficient documentation

## 2022-05-27 DIAGNOSIS — Z951 Presence of aortocoronary bypass graft: Secondary | ICD-10-CM | POA: Diagnosis not present

## 2022-05-27 DIAGNOSIS — Z79899 Other long term (current) drug therapy: Secondary | ICD-10-CM | POA: Insufficient documentation

## 2022-05-27 LAB — CBC
HCT: 42.1 % (ref 39.0–52.0)
Hemoglobin: 14.6 g/dL (ref 13.0–17.0)
MCH: 30.7 pg (ref 26.0–34.0)
MCHC: 34.7 g/dL (ref 30.0–36.0)
MCV: 88.4 fL (ref 80.0–100.0)
Platelets: 173 10*3/uL (ref 150–400)
RBC: 4.76 MIL/uL (ref 4.22–5.81)
RDW: 13.1 % (ref 11.5–15.5)
WBC: 10.2 10*3/uL (ref 4.0–10.5)
nRBC: 0 % (ref 0.0–0.2)

## 2022-05-27 LAB — BASIC METABOLIC PANEL
Anion gap: 10 (ref 5–15)
BUN: 31 mg/dL — ABNORMAL HIGH (ref 8–23)
CO2: 24 mmol/L (ref 22–32)
Calcium: 9 mg/dL (ref 8.9–10.3)
Chloride: 101 mmol/L (ref 98–111)
Creatinine, Ser: 1.05 mg/dL (ref 0.61–1.24)
GFR, Estimated: >60 mL/min (ref >60)
Glucose, Bld: 171 mg/dL — ABNORMAL HIGH (ref 70–99)
Potassium: 4 mmol/L (ref 3.5–5.1)
Sodium: 135 mmol/L (ref 135–145)

## 2022-05-27 LAB — SARS CORONAVIRUS 2 BY RT PCR: SARS Coronavirus 2 by RT PCR: NEGATIVE

## 2022-05-27 LAB — TSH: TSH: 1.903 u[IU]/mL (ref 0.350–4.500)

## 2022-05-27 LAB — D-DIMER, QUANTITATIVE: D-Dimer, Quant: 0.34 ug/mL-FEU (ref 0.00–0.50)

## 2022-05-27 LAB — TROPONIN I (HIGH SENSITIVITY): Troponin I (High Sensitivity): 8 ng/L (ref <18)

## 2022-05-27 MED ORDER — METOPROLOL TARTRATE 5 MG/5ML IV SOLN
5.0000 mg | Freq: Once | INTRAVENOUS | Status: AC
  Administered 2022-05-27: 5 mg via INTRAVENOUS
  Filled 2022-05-27: qty 5

## 2022-05-27 NOTE — ED Notes (Signed)
Discharge instructions reviewed with patient. Patient verbalizes understanding, no further questions at this time/ follow up information provided. No acute distress noted at time of departure.

## 2022-05-27 NOTE — ED Triage Notes (Addendum)
C/o chest tightness, shortness of breath. Hx of a.fib. States watch has been alarming that HR was elevated. Missed metoprolol dose last night. Hx of multiple bypass.  HR in 130s upon entering triage room

## 2022-05-27 NOTE — ED Provider Notes (Signed)
[facility] EMERGENCY DEPARTMENT Provider Note   CSN: [id number] Arrival date & time: 05/27/22  1335     History  Chief Complaint  Patient presents with   Chest Pain    David Frost is a 71 y.o. male.  Pt is a 71 yo male with a pmhx significant for CAD s/p CABG, RBBB, HTN, obesity, DM2, GERD, and afib (s/p cardioversion in 2018; not on thinners as he's had no recurrence).  Pt said his watch has told him that his HR was over 100.  He thinks he missed his metoprolol last night.  He is anxious and has been dealing with some stress at work.  Pt took a Xanax, but it did not help.  Pt denies any cp.       Home Medications Prior to Admission medications   Medication Sig Start Date End Date Taking? Authorizing Provider  acetaminophen (TYLENOL) 325 MG tablet Take 2 tablets (650 mg total) by mouth every 6 (six) hours as needed for mild pain. 03/27/17   Barrett, Erin R, PA-C  ALPRAZolam (XANAX) 0.25 MG tablet Take 1 tablet (0.25 mg total) by mouth 2 (two) times daily as needed for anxiety. 03/27/17   Barrett, Lodema Hong, PA-C  aspirin EC 81 MG tablet Take 1 tablet (81 mg total) by mouth daily. 09/23/17   Barrett, Evelene Croon, PA-C  atorvastatin (LIPITOR) 40 MG tablet TAKE 1 TABLET(40 MG) BY MOUTH DAILY 09/01/21   Lorretta Harp, MD  bimatoprost (LUMIGAN) 0.01 % SOLN Lumigan 0.01 % eye drops  INSTILL 1 DROP INTO LEFT EYE EVERY NIGHT AT BEDTIME    [provider]  brinzolamide (AZOPT) 1 % ophthalmic suspension 1 drop in both eye twice weekly    [provider]  canagliflozin (INVOKANA) 300 MG TABS tablet Take 300 mg by mouth daily.    [provider]  icosapent Ethyl (VASCEPA) 1 g capsule Take 2 capsules (2 g total) by mouth 2 (two) times daily. 07/23/21   Lorretta Harp, MD  loratadine (CLARITIN) 10 MG tablet Take 10 mg by mouth daily.    [provider]  metoprolol succinate (TOPROL-XL) 50 MG 24 hr tablet Take 1 tablet (50 mg total) by mouth daily.  09/06/19   Lorretta Harp, MD  Multiple Vitamins-Minerals (PRESERVISION AREDS 2 PO) Take 1 capsule by mouth 2 (two) times daily.    [provider]  nitroGLYCERIN (NITROSTAT) 0.4 MG SL tablet PLACE 1 TABLET UNDER THE TONGUE EVERY 5 MINUTES AS NEEDED FOR CHEST PAIN 02/26/20   Lorretta Harp, MD  olmesartan-hydrochlorothiazide (BENICAR HCT) 40-12.5 MG tablet Take 1 tablet by mouth every morning. 02/15/20   [provider]  PROAIR RESPICLICK 785 (90 Base) MCG/ACT AEPB Take 2 puffs by mouth as directed. 02/24/17   [provider]  Semaglutide, 1 MG/DOSE, (OZEMPIC, 1 MG/DOSE,) 2 MG/1.5ML SOPN Ozempic 1 mg/dose (2 mg/1.5 mL) subcutaneous pen injector  INJECT 1 MG UNDER THE SKIN ONCE A WEEK    [provider]      Allergies    Patient has no known allergies.    Review of Systems   Review of Systems  Cardiovascular:  Positive for palpitations.  All other systems reviewed and are negative.   Physical Exam Updated Vital Signs BP 122/70   Pulse 81   Temp 98 F (36.7 C) (Oral)   Resp 20   Ht '5\' 11"'$  (1.803 m)   Wt (!) 145.2 kg   SpO2 95%  BMI 44.63 kg/m  Physical Exam Vitals and nursing note reviewed.  Constitutional:      Appearance: He is well-developed. He is obese.  HENT:     Head: Normocephalic and atraumatic.  Eyes:     Extraocular Movements: Extraocular movements intact.     Pupils: Pupils are equal, round, and reactive to light.  Cardiovascular:     Rate and Rhythm: Regular rhythm. Tachycardia present.     Heart sounds: Normal heart sounds.  Pulmonary:     Effort: Pulmonary effort is normal.     Breath sounds: Normal breath sounds.  Abdominal:     General: Bowel sounds are normal.     Palpations: Abdomen is soft.  Musculoskeletal:        General: Normal range of motion.     Cervical back: Normal range of motion and neck supple.     Right lower leg: Edema present.     Left lower leg: Edema present.     Comments: Chronic venous  changes BLE  Skin:    General: Skin is warm.     Capillary Refill: Capillary refill takes less than 2 seconds.  Neurological:     General: No focal deficit present.     Mental Status: He is alert and oriented to person, place, and time.  Psychiatric:        Mood and Affect: Mood normal.        Behavior: Behavior normal.     ED Results / Procedures / Treatments   Labs (all labs ordered are listed, but only abnormal results are displayed) Labs Reviewed  BASIC METABOLIC PANEL - Abnormal; Notable for the following components:      Result Value   Glucose, Bld 171 (*)    BUN 31 (*)    All other components within normal limits  SARS CORONAVIRUS 2 BY RT PCR  CBC  D-DIMER, QUANTITATIVE  TSH  TROPONIN I (HIGH SENSITIVITY)  TROPONIN I (HIGH SENSITIVITY)    EKG EKG Interpretation  Date/Time:  Wednesday May 27 2022 13:47:03 EDT Ventricular Rate:  113 PR Interval:  184 QRS Duration: 148 QT Interval:  380 QTC Calculation: 521 R Axis:   264 Text Interpretation: Sinus tachycardia Right bundle branch block Anterior infarct , age undetermined Abnormal ECG When compared with ECG of 04-May-2017 13:53, PREVIOUS ECG IS PRESENT Since last tracing rate faster Confirmed by Isla Pence [phone]) on 05/27/2022 1:51:16 PM  Radiology DG Chest Port 1 View  Result Date: 05/27/2022 CLINICAL DATA:  Chest pain and tightness. EXAM: PORTABLE CHEST 1 VIEW COMPARISON:  Two-view chest x-ray 10/18/2017 FINDINGS: Heart is enlarged. Median sternotomy noted. No edema or effusion is present. No focal airspace disease is present. The visualized soft tissues and bony thorax are otherwise unremarkable. IMPRESSION: 1. Cardiomegaly without failure. 2. No acute cardiopulmonary disease. Electronically Signed   By: San Morelle M.D.   On: 05/27/2022 14:14    Procedures Procedures    Medications Ordered in ED Medications  metoprolol tartrate (LOPRESSOR) injection 5 mg (5 mg Intravenous Given 05/27/22 1426)     ED Course/ Medical Decision Making/ A&P                           Medical Decision Making Amount and/or Complexity of Data Reviewed Labs: ordered. Radiology: ordered.  Risk Prescription drug management.   This patient presents to the ED for concern of tachycardia, this involves an extensive number of treatment options, and is a  complaint that carries with it a high risk of complications and morbidity.  The differential diagnosis includes afib, sinus tachy, vt, infection, pe   Co morbidities that complicate the patient evaluation  CAD s/p CABG, RBBB, HTN, obesity, DM2, GERD, and afib (s/p cardioversion in 2018; not on thinners as he's had no recurrence)   Additional history obtained:  Additional history obtained from epic chart review External records from outside source obtained and reviewed including son   Lab Tests:  I Ordered, and personally interpreted labs.  The pertinent results include:  cbc nl, bmp nl other than bs 171; trop nl    Imaging Studies ordered:  I ordered imaging studies including cxr  I independently visualized and interpreted imaging which showed  IMPRESSION:  1. Cardiomegaly without failure.  2. No acute cardiopulmonary disease.   I agree with the radiologist interpretation   Cardiac Monitoring:  The patient was maintained on a cardiac monitor.  I personally viewed and interpreted the cardiac monitored which showed an underlying rhythm of: sinus tachy   Medicines ordered and prescription drug management:  I ordered medication including lopressor  for tachycardia  Reevaluation of the patient after these medicines showed that the patient improved I have reviewed the patients home medicines and have made adjustments as needed   Test Considered:  ct   Critical Interventions:  lopressor   Problem List / ED Course:  Sinus tachycardia:  HR down to the 80s after 5 mg lopressor.  Pt feels much better.  Ddimer neg.  Trop nl.  Pt just  started with the [facility].  He is encouraged to keep that up.  He is to take his normal home meds.  Return if worse.  F/u with pcp.   Reevaluation:  After the interventions noted above, I reevaluated the patient and found that they have :improved   Social Determinants of Health:  Lives at home   Dispostion:  After consideration of the diagnostic results and the patients response to treatment, I feel that the patent would benefit from discharge with outpatient f/u.          Final Clinical Impression(s) / ED Diagnoses Final diagnoses:  Sinus tachycardia    Rx / DC Orders ED Discharge Orders     None         Isla Pence, MD 05/27/22 1524

## 2022-06-02 ENCOUNTER — Ambulatory Visit (INDEPENDENT_AMBULATORY_CARE_PROVIDER_SITE_OTHER): Payer: Medicare Other | Admitting: Family Medicine

## 2022-06-02 ENCOUNTER — Encounter (INDEPENDENT_AMBULATORY_CARE_PROVIDER_SITE_OTHER): Payer: Self-pay | Admitting: Family Medicine

## 2022-06-02 VITALS — BP 121/68 | HR 85 | Temp 97.7°F | Ht 71.0 in | Wt 318.0 lb

## 2022-06-02 DIAGNOSIS — E1159 Type 2 diabetes mellitus with other circulatory complications: Secondary | ICD-10-CM | POA: Diagnosis not present

## 2022-06-02 DIAGNOSIS — E785 Hyperlipidemia, unspecified: Secondary | ICD-10-CM | POA: Diagnosis not present

## 2022-06-02 DIAGNOSIS — Z6841 Body Mass Index (BMI) 40.0 and over, adult: Secondary | ICD-10-CM | POA: Diagnosis not present

## 2022-06-02 DIAGNOSIS — E559 Vitamin D deficiency, unspecified: Secondary | ICD-10-CM | POA: Diagnosis not present

## 2022-06-02 DIAGNOSIS — E1169 Type 2 diabetes mellitus with other specified complication: Secondary | ICD-10-CM | POA: Diagnosis not present

## 2022-06-02 DIAGNOSIS — I152 Hypertension secondary to endocrine disorders: Secondary | ICD-10-CM

## 2022-06-02 DIAGNOSIS — E1165 Type 2 diabetes mellitus with hyperglycemia: Secondary | ICD-10-CM | POA: Diagnosis not present

## 2022-06-02 DIAGNOSIS — E669 Obesity, unspecified: Secondary | ICD-10-CM | POA: Diagnosis not present

## 2022-06-02 DIAGNOSIS — Z7985 Long-term (current) use of injectable non-insulin antidiabetic drugs: Secondary | ICD-10-CM

## 2022-06-02 MED ORDER — OZEMPIC (0.25 OR 0.5 MG/DOSE) 2 MG/3ML ~~LOC~~ SOPN: 0.2500 mg | PEN_INJECTOR | SUBCUTANEOUS | 0 refills | Status: DC

## 2022-06-02 MED ORDER — VITAMIN D (ERGOCALCIFEROL) 1.25 MG (50000 UNIT) PO CAPS: 50000.0000 [IU] | ORAL_CAPSULE | ORAL | 0 refills | Status: DC

## 2022-06-04 NOTE — Progress Notes (Unsigned)
Chief Complaint:   OBESITY David Frost is here to discuss his progress with his obesity treatment plan along with follow-up of his obesity related diagnoses. David Frost is on the Category 4 Plan and states he is following his eating plan approximately 75-80% of the time. David Frost states he is riding bike 20-30 minutes 1-2 times per week.  Today's visit was #: 2 Starting weight: 319 lbs Starting date: 05/19/2022 Today's weight: 318 lbs Today's date: 06/02/2022 Total lbs lost to date: 1 lb Total lbs lost since last in-office visit: 1  Interim History: Getting oriented to meal plan. Wondering about avocados, prune juice. Eating on plan--eating the designated portion of protein. Not necessarily always getting all meat in. No plans for the next few weeks.  Subjective:   1. Hypertension associated with diabetes (David Frost) David Frost's blood pressure within normal limits today.  2. Vitamin D deficiency Labs discussed during visit today. David Frost is on supplement. Note fatigue. Vit D level of 22.7.  3. Hyperlipidemia associated with type 2 diabetes mellitus (David Frost) Labs discussed during visit today. David Frost is on Vascepa with no real change in Triglycerides. LDL 55, HDL 35, Trigly 361.  4. Type 2 diabetes mellitus with hyperglycemia, without long-term current use of insulin (David Frost) Labs discussed during visit today. David Frost on [place]. Last A1c 6.9. He stopped Ozempic due to feelings of bloat. Insulin 28.2.  Assessment/Plan:   1. Hypertension associated with diabetes (David Frost) Follow up blood pressure at next appointment.  2. Vitamin D deficiency Start Vit D 50k IU once a week for 1 month with 0 refills. Will retest Vit D in 3 months.  -Start Vitamin D, Ergocalciferol, (DRISDOL) 1.25 MG (50000 UNIT) CAPS capsule; Take 1 capsule (50,000 Units total) by mouth every 7 (seven) days.  Dispense: 4 capsule; Refill: 0  3. Hyperlipidemia associated with type 2 diabetes mellitus (David Frost) Follow up with Dr.  Gwenlyn Frost about questions addition of Fenofibrate.   4. Type 2 diabetes mellitus with hyperglycemia, without long-term current use of insulin (David Frost) Decrease Ozempic to 0.25 mg weekly for 1 month with 0 refills. STOP Jardiance.  -Decrease Semaglutide,0.25 or 0.'5MG'$ /DOS, (OZEMPIC, 0.25 OR 0.5 MG/DOSE,) 2 MG/3ML SOPN; Inject 0.25 mg into the skin once a week.  Dispense: 3 mL; Refill: 0  5. Obesity with current BMI of 44.4 David Frost is currently in the action stage of change. As such, his goal is to continue with weight loss efforts. He has agreed to the Category 4 Plan.   Exercise goals: All adults should avoid inactivity. Some physical activity is better than none, and adults who participate in any amount of physical activity gain some health benefits.  Behavioral modification strategies: increasing lean protein intake, decreasing eating out, and meal planning and cooking strategies.  David Frost has agreed to follow-up with our clinic in 2 weeks. He was informed of the importance of frequent follow-up visits to maximize his success with intensive lifestyle modifications for his multiple health conditions.   Objective:   Blood pressure 121/68, pulse 85, temperature 97.7 F (36.5 C), height '5\' 11"'$  (1.803 m), weight (!) 318 lb (144.2 kg), SpO2 96 %. Body mass index is 44.35 kg/m.  General: Cooperative, alert, well developed, in no acute distress. HEENT: Conjunctivae and lids unremarkable. Cardiovascular: Regular rhythm.  Lungs: Normal work of breathing. Neurologic: No focal deficits.   Lab Results  Component Value Date   CREATININE 1.05 05/27/2022   BUN 31 (H) 05/27/2022   NA 135 05/27/2022   K 4.0 05/27/2022  CL 101 05/27/2022   CO2 24 05/27/2022   Lab Results  Component Value Date   ALT 31 05/19/2022   AST 13 05/19/2022   ALKPHOS 97 05/19/2022   BILITOT 0.7 05/19/2022   Lab Results  Component Value Date   HGBA1C 6.9 (H) 05/19/2022   HGBA1C 6.0 (H) 03/18/2017   Lab Results   Component Value Date   INSULIN 28.2 (H) 05/19/2022   Lab Results  Component Value Date   TSH 1.903 05/27/2022   Lab Results  Component Value Date   CHOL 145 05/19/2022   HDL 35 (L) 05/19/2022   LDLCALC 55 05/19/2022   TRIG 361 (H) 05/19/2022   CHOLHDL 4.2 07/23/2021   Lab Results  Component Value Date   VD25OH 22.7 (L) 05/19/2022   Lab Results  Component Value Date   WBC 10.2 05/27/2022   HGB 14.6 05/27/2022   HCT 42.1 05/27/2022   MCV 88.4 05/27/2022   PLT 173 05/27/2022   No results Frost for: "IRON", "TIBC", "FERRITIN"  Attestation Statements:   Reviewed by clinician on day of visit: allergies, medications, problem list, medical history, surgical history, family history, social history, and previous encounter notes.  Time spent on visit including pre-visit chart review and post-visit care and charting was 50 minutes.   I, David Frost, RMA am acting as transcriptionist for David Common, MD. I have reviewed the above documentation for accuracy and completeness, and I agree with the above. David Common, MD'

## 2022-06-08 ENCOUNTER — Other Ambulatory Visit (INDEPENDENT_AMBULATORY_CARE_PROVIDER_SITE_OTHER): Payer: Self-pay | Admitting: Family Medicine

## 2022-06-08 DIAGNOSIS — E559 Vitamin D deficiency, unspecified: Secondary | ICD-10-CM

## 2022-06-09 DIAGNOSIS — L97929 Non-pressure chronic ulcer of unspecified part of left lower leg with unspecified severity: Secondary | ICD-10-CM | POA: Diagnosis not present

## 2022-06-09 DIAGNOSIS — E1151 Type 2 diabetes mellitus with diabetic peripheral angiopathy without gangrene: Secondary | ICD-10-CM | POA: Diagnosis not present

## 2022-06-09 DIAGNOSIS — I87312 Chronic venous hypertension (idiopathic) with ulcer of left lower extremity: Secondary | ICD-10-CM | POA: Diagnosis not present

## 2022-06-09 DIAGNOSIS — I739 Peripheral vascular disease, unspecified: Secondary | ICD-10-CM | POA: Diagnosis not present

## 2022-06-09 DIAGNOSIS — R609 Edema, unspecified: Secondary | ICD-10-CM | POA: Diagnosis not present

## 2022-06-10 ENCOUNTER — Telehealth (INDEPENDENT_AMBULATORY_CARE_PROVIDER_SITE_OTHER): Payer: Self-pay | Admitting: Family Medicine

## 2022-06-10 NOTE — Telephone Encounter (Signed)
Patient wants to know what his daily calorie intake should be. He would like a call back.

## 2022-06-10 NOTE — Telephone Encounter (Signed)
Spoke w/ pt- Pt aware the calorie range is 1650-1800 calories and 120 grams of protein.

## 2022-06-29 ENCOUNTER — Ambulatory Visit (INDEPENDENT_AMBULATORY_CARE_PROVIDER_SITE_OTHER): Payer: Medicare Other | Admitting: Family Medicine

## 2022-06-30 ENCOUNTER — Ambulatory Visit (INDEPENDENT_AMBULATORY_CARE_PROVIDER_SITE_OTHER): Payer: Medicare Other | Admitting: Family Medicine

## 2022-07-06 ENCOUNTER — Encounter (INDEPENDENT_AMBULATORY_CARE_PROVIDER_SITE_OTHER): Payer: Self-pay | Admitting: Family Medicine

## 2022-07-06 ENCOUNTER — Ambulatory Visit (INDEPENDENT_AMBULATORY_CARE_PROVIDER_SITE_OTHER): Payer: Medicare Other | Admitting: Family Medicine

## 2022-07-06 VITALS — BP 129/74 | HR 66 | Temp 97.7°F | Ht 71.0 in | Wt 315.0 lb

## 2022-07-06 DIAGNOSIS — Z7985 Long-term (current) use of injectable non-insulin antidiabetic drugs: Secondary | ICD-10-CM | POA: Diagnosis not present

## 2022-07-06 DIAGNOSIS — E1165 Type 2 diabetes mellitus with hyperglycemia: Secondary | ICD-10-CM | POA: Diagnosis not present

## 2022-07-06 DIAGNOSIS — E669 Obesity, unspecified: Secondary | ICD-10-CM | POA: Diagnosis not present

## 2022-07-06 DIAGNOSIS — Z6841 Body Mass Index (BMI) 40.0 and over, adult: Secondary | ICD-10-CM | POA: Diagnosis not present

## 2022-07-06 DIAGNOSIS — E559 Vitamin D deficiency, unspecified: Secondary | ICD-10-CM

## 2022-07-06 MED ORDER — VITAMIN D (ERGOCALCIFEROL) 1.25 MG (50000 UNIT) PO CAPS: 50000.0000 [IU] | ORAL_CAPSULE | ORAL | 0 refills | Status: DC

## 2022-07-06 MED ORDER — OZEMPIC (0.25 OR 0.5 MG/DOSE) 2 MG/3ML ~~LOC~~ SOPN: 0.2500 mg | PEN_INJECTOR | SUBCUTANEOUS | 0 refills | Status: DC

## 2022-07-09 NOTE — Progress Notes (Signed)
Chief Complaint:   OBESITY Melanie is here to discuss his progress with his obesity treatment plan along with follow-up of his obesity related diagnoses. Percell is on the Category 4 Plan and states he is following his eating plan approximately 80% of the time. Timoteo states he is bike riding 20-30 minutes 3 times per week.  Today's visit was #: 3 Starting weight: 319 lbs Starting date: 05/19/2022 Today's weight: 315 lbs Today's date: 07/06/2022 Total lbs lost to date: 4 lbs Total lbs lost since last in-office visit: 3  Interim History: Hussien was out of town at time of last appointment. Following plan 80% of time. Very rarely eating off plan but is being mindful of choices. Eating string cheese, artichoke hearts and garbanzo beans and protein drink for snack calories.  Subjective:   1. Vitamin D deficiency Tramar is currently taking prescription Vit D 50,000 IU once a week.  He notes fatigue. Last Vit D level of 22.7 in Aug 2023.  2. Type 2 diabetes mellitus with hyperglycemia, without long-term current use of insulin (HCC) Jarian is on Ozempic. Last A1c 6.9, insulin 28.2.  Assessment/Plan:   1. Vitamin D deficiency We will refill Vit D 50,000 IU once a week for 1 month with 0 refills.  -Refill Vitamin D, Ergocalciferol, (DRISDOL) 1.25 MG (50000 UNIT) CAPS capsule; Take 1 capsule (50,000 Units total) by mouth every 7 (seven) days.  Dispense: 8 capsule; Refill: 0  2. Type 2 diabetes mellitus with hyperglycemia, without long-term current use of insulin (HCC) We will refill Ozempic 0.25 mg once weekly for 1 month with 0 refills.  -Refill Semaglutide,0.25 or 0.'5MG'$ /DOS, (OZEMPIC, 0.25 OR 0.5 MG/DOSE,) 2 MG/3ML SOPN; Inject 0.25 mg into the skin once a week.  Dispense: 3 mL; Refill: 0  3. Obesity with current BMI of 44.0 Hunter is currently in the action stage of change. As such, his goal is to continue with weight loss efforts. He has agreed to the Category 4 Plan.   Exercise  goals: All adults should avoid inactivity. Some physical activity is better than none, and adults who participate in any amount of physical activity gain some health benefits.  Behavioral modification strategies: increasing lean protein intake, meal planning and cooking strategies, keeping healthy foods in the home, and planning for success.  Ilia has agreed to follow-up with our clinic in 4 weeks. He was informed of the importance of frequent follow-up visits to maximize his success with intensive lifestyle modifications for his multiple health conditions.   Objective:   Blood pressure 129/74, pulse 66, temperature 97.7 F (36.5 C), height '5\' 11"'$  (1.803 m), weight (!) 315 lb (142.9 kg), SpO2 96 %. Body mass index is 43.93 kg/m.  General: Cooperative, alert, well developed, in no acute distress. HEENT: Conjunctivae and lids unremarkable. Cardiovascular: Regular rhythm.  Lungs: Normal work of breathing. Neurologic: No focal deficits.   Lab Results  Component Value Date   CREATININE 1.05 05/27/2022   BUN 31 (H) 05/27/2022   NA 135 05/27/2022   K 4.0 05/27/2022   CL 101 05/27/2022   CO2 24 05/27/2022   Lab Results  Component Value Date   ALT 31 05/19/2022   AST 13 05/19/2022   ALKPHOS 97 05/19/2022   BILITOT 0.7 05/19/2022   Lab Results  Component Value Date   HGBA1C 6.9 (H) 05/19/2022   HGBA1C 6.0 (H) 03/18/2017   Lab Results  Component Value Date   INSULIN 28.2 (H) 05/19/2022   Lab Results  Component Value Date   TSH 1.903 05/27/2022   Lab Results  Component Value Date   CHOL 145 05/19/2022   HDL 35 (L) 05/19/2022   LDLCALC 55 05/19/2022   TRIG 361 (H) 05/19/2022   CHOLHDL 4.2 07/23/2021   Lab Results  Component Value Date   VD25OH 22.7 (L) 05/19/2022   Lab Results  Component Value Date   WBC 10.2 05/27/2022   HGB 14.6 05/27/2022   HCT 42.1 05/27/2022   MCV 88.4 05/27/2022   PLT 173 05/27/2022   No results found for: "IRON", "TIBC",  "FERRITIN"  Obesity Behavioral Intervention:   Approximately 15 minutes were spent on the discussion below.  ASK: We discussed the diagnosis of obesity with Kalman Shan today and Michell agreed to give [place] permission to discuss obesity behavioral modification therapy today.  ASSESS: Rishaan has the diagnosis of obesity and his BMI today is 44.0. Rayquon is in the action stage of change.   ADVISE: Kapil was educated on the multiple health risks of obesity as well as the benefit of weight loss to improve his health. He was advised of the need for long term treatment and the importance of lifestyle modifications to improve his current health and to decrease his risk of future health problems.  AGREE: Multiple dietary modification options and treatment options were discussed and Dickey agreed to follow the recommendations documented in the above note.  ARRANGE: Khadar was educated on the importance of frequent visits to treat obesity as outlined per CMS and USPSTF guidelines and agreed to schedule his next follow up appointment today.  Attestation Statements:   Reviewed by clinician on day of visit: allergies, medications, problem list, medical history, surgical history, family history, social history, and previous encounter notes.  I, Elnora Morrison, RMA am acting as transcriptionist for Coralie Common, MD.  I have reviewed the above documentation for accuracy and completeness, and I agree with the above. - Coralie Common, MD

## 2022-07-31 DIAGNOSIS — H401131 Primary open-angle glaucoma, bilateral, mild stage: Secondary | ICD-10-CM | POA: Diagnosis not present

## 2022-07-31 DIAGNOSIS — H353121 Nonexudative age-related macular degeneration, left eye, early dry stage: Secondary | ICD-10-CM | POA: Diagnosis not present

## 2022-08-02 ENCOUNTER — Other Ambulatory Visit: Payer: Self-pay | Admitting: Cardiovascular Disease

## 2022-08-04 ENCOUNTER — Other Ambulatory Visit: Payer: Self-pay | Admitting: Cardiovascular Disease

## 2022-08-06 ENCOUNTER — Encounter (INDEPENDENT_AMBULATORY_CARE_PROVIDER_SITE_OTHER): Payer: Self-pay | Admitting: Family Medicine

## 2022-08-06 ENCOUNTER — Ambulatory Visit (INDEPENDENT_AMBULATORY_CARE_PROVIDER_SITE_OTHER): Payer: Medicare Other | Admitting: Family Medicine

## 2022-08-06 VITALS — BP 137/67 | HR 73 | Temp 98.0°F | Ht 71.0 in | Wt 313.0 lb

## 2022-08-06 DIAGNOSIS — E1165 Type 2 diabetes mellitus with hyperglycemia: Secondary | ICD-10-CM | POA: Diagnosis not present

## 2022-08-06 DIAGNOSIS — Z7985 Long-term (current) use of injectable non-insulin antidiabetic drugs: Secondary | ICD-10-CM | POA: Diagnosis not present

## 2022-08-06 DIAGNOSIS — Z6841 Body Mass Index (BMI) 40.0 and over, adult: Secondary | ICD-10-CM

## 2022-08-06 DIAGNOSIS — E559 Vitamin D deficiency, unspecified: Secondary | ICD-10-CM | POA: Diagnosis not present

## 2022-08-06 DIAGNOSIS — E669 Obesity, unspecified: Secondary | ICD-10-CM

## 2022-08-06 MED ORDER — VITAMIN D (ERGOCALCIFEROL) 1.25 MG (50000 UNIT) PO CAPS: 50000.0000 [IU] | ORAL_CAPSULE | ORAL | 0 refills | Status: DC

## 2022-08-12 ENCOUNTER — Other Ambulatory Visit (INDEPENDENT_AMBULATORY_CARE_PROVIDER_SITE_OTHER): Payer: Self-pay | Admitting: Family Medicine

## 2022-08-12 DIAGNOSIS — E1165 Type 2 diabetes mellitus with hyperglycemia: Secondary | ICD-10-CM

## 2022-08-16 ENCOUNTER — Other Ambulatory Visit: Payer: Self-pay | Admitting: Cardiovascular Disease

## 2022-08-17 DIAGNOSIS — E782 Mixed hyperlipidemia: Secondary | ICD-10-CM | POA: Diagnosis not present

## 2022-08-17 DIAGNOSIS — Z23 Encounter for immunization: Secondary | ICD-10-CM | POA: Diagnosis not present

## 2022-08-17 DIAGNOSIS — I10 Essential (primary) hypertension: Secondary | ICD-10-CM | POA: Diagnosis not present

## 2022-08-17 DIAGNOSIS — R21 Rash and other nonspecific skin eruption: Secondary | ICD-10-CM | POA: Diagnosis not present

## 2022-08-17 DIAGNOSIS — I25810 Atherosclerosis of coronary artery bypass graft(s) without angina pectoris: Secondary | ICD-10-CM | POA: Diagnosis not present

## 2022-08-17 DIAGNOSIS — I739 Peripheral vascular disease, unspecified: Secondary | ICD-10-CM | POA: Diagnosis not present

## 2022-08-17 DIAGNOSIS — G4733 Obstructive sleep apnea (adult) (pediatric): Secondary | ICD-10-CM | POA: Diagnosis not present

## 2022-08-17 DIAGNOSIS — I87319 Chronic venous hypertension (idiopathic) with ulcer of unspecified lower extremity: Secondary | ICD-10-CM | POA: Diagnosis not present

## 2022-08-17 DIAGNOSIS — E1151 Type 2 diabetes mellitus with diabetic peripheral angiopathy without gangrene: Secondary | ICD-10-CM | POA: Diagnosis not present

## 2022-08-18 ENCOUNTER — Other Ambulatory Visit: Payer: Self-pay | Admitting: Cardiovascular Disease

## 2022-08-19 NOTE — Progress Notes (Signed)
Chief Complaint:   OBESITY David Frost is here to discuss his progress with his obesity treatment plan along with follow-up of his obesity related diagnoses. David Frost is on the Category 4 Plan and states he is following his eating plan approximately 90% of the time. David Frost states he is bike riding 20 minutes 3 times per week.  Today's visit was #: 4 Starting weight: 319 lbs Starting date: 05/19/2022 Today's weight: 313 lbs Today's date: 08/06/2022 Total lbs lost to date: 6 lbs Total lbs lost since last in-office visit: 2  Interim History: David Frost is sticking to plan as consistently as he can--eating lots of protein-chicken, cottage cheese. He voices he is weighing meat and getting 4 oz at supper. Drinking [employer] Protein shakes daily. He would like more consistent nutrition education.  Subjective:   1. Type 2 diabetes mellitus with hyperglycemia, without long-term current use of insulin (HCC) David Frost is on Ozempic 0.25 mg weekly. Denies GI side effects.  2. Vitamin D deficiency David Frost's last Vit D level of 22.7. Denies any nausea, vomiting or muscle weakness. He notes fatigue.  Assessment/Plan:   1. Type 2 diabetes mellitus with hyperglycemia, without long-term current use of insulin (David Frost) Continue Ozempic without any changes in dose.  2. Vitamin D deficiency We will refill Vit D 50K IU once a week for 1 month with 0 refills.  -Refill Vitamin D, Ergocalciferol, (DRISDOL) 1.25 MG (50000 UNIT) CAPS capsule; Take 1 capsule (50,000 Units total) by mouth every 7 (seven) days.  Dispense: 8 capsule; Refill: 0  3. Obesity with current BMI of 43.7 David Frost is currently in the action stage of change. As such, his goal is to continue with weight loss efforts. He has agreed to the Category 4 Plan.   Exercise goals: All adults should avoid inactivity. Some physical activity is better than none, and adults who participate in any amount of physical activity gain some health  benefits.  Behavioral modification strategies: increasing lean protein intake, meal planning and cooking strategies, keeping healthy foods in the home, and planning for success.  David Frost has agreed to follow-up with our clinic in 3 weeks. He was informed of the importance of frequent follow-up visits to maximize his success with intensive lifestyle modifications for his multiple health conditions.   Objective:   Blood pressure 137/67, pulse 73, temperature 98 F (36.7 C), height '5\' 11"'$  (1.803 m), weight (!) 313 lb (142 kg), SpO2 98 %. Body mass index is 43.65 kg/m.  General: Cooperative, alert, well developed, in no acute distress. HEENT: Conjunctivae and lids unremarkable. Cardiovascular: Regular rhythm.  Lungs: Normal work of breathing. Neurologic: No focal deficits.   Lab Results  Component Value Date   CREATININE 1.05 05/27/2022   BUN 31 (H) 05/27/2022   NA 135 05/27/2022   K 4.0 05/27/2022   CL 101 05/27/2022   CO2 24 05/27/2022   Lab Results  Component Value Date   ALT 31 05/19/2022   AST 13 05/19/2022   ALKPHOS 97 05/19/2022   BILITOT 0.7 05/19/2022   Lab Results  Component Value Date   HGBA1C 6.9 (H) 05/19/2022   HGBA1C 6.0 (H) 03/18/2017   Lab Results  Component Value Date   INSULIN 28.2 (H) 05/19/2022   Lab Results  Component Value Date   TSH 1.903 05/27/2022   Lab Results  Component Value Date   CHOL 145 05/19/2022   HDL 35 (L) 05/19/2022   LDLCALC 55 05/19/2022   TRIG 361 (H) 05/19/2022   CHOLHDL  4.2 07/23/2021   Lab Results  Component Value Date   VD25OH 22.7 (L) 05/19/2022   Lab Results  Component Value Date   WBC 10.2 05/27/2022   HGB 14.6 05/27/2022   HCT 42.1 05/27/2022   MCV 88.4 05/27/2022   PLT 173 05/27/2022   No results found for: "IRON", "TIBC", "FERRITIN"  Attestation Statements:   Reviewed by clinician on day of visit: allergies, medications, problem list, medical history, surgical history, family history, social history,  and previous encounter notes.  I, Elnora Morrison, RMA am acting as transcriptionist for Coralie Common, MD.  I have reviewed the above documentation for accuracy and completeness, and I agree with the above. - Coralie Common, MD

## 2022-08-20 ENCOUNTER — Encounter (INDEPENDENT_AMBULATORY_CARE_PROVIDER_SITE_OTHER): Payer: Self-pay | Admitting: Family Medicine

## 2022-08-20 ENCOUNTER — Ambulatory Visit (INDEPENDENT_AMBULATORY_CARE_PROVIDER_SITE_OTHER): Payer: Medicare Other | Admitting: Family Medicine

## 2022-08-20 VITALS — BP 144/64 | HR 78 | Temp 97.8°F | Ht 71.0 in | Wt 313.0 lb

## 2022-08-20 DIAGNOSIS — Z7985 Long-term (current) use of injectable non-insulin antidiabetic drugs: Secondary | ICD-10-CM

## 2022-08-20 DIAGNOSIS — E1159 Type 2 diabetes mellitus with other circulatory complications: Secondary | ICD-10-CM | POA: Diagnosis not present

## 2022-08-20 DIAGNOSIS — E559 Vitamin D deficiency, unspecified: Secondary | ICD-10-CM

## 2022-08-20 DIAGNOSIS — E669 Obesity, unspecified: Secondary | ICD-10-CM | POA: Diagnosis not present

## 2022-08-20 DIAGNOSIS — I152 Hypertension secondary to endocrine disorders: Secondary | ICD-10-CM

## 2022-08-20 DIAGNOSIS — Z6841 Body Mass Index (BMI) 40.0 and over, adult: Secondary | ICD-10-CM

## 2022-08-20 DIAGNOSIS — E1165 Type 2 diabetes mellitus with hyperglycemia: Secondary | ICD-10-CM

## 2022-08-20 MED ORDER — OZEMPIC (0.25 OR 0.5 MG/DOSE) 2 MG/3ML ~~LOC~~ SOPN: 0.5000 mg | PEN_INJECTOR | SUBCUTANEOUS | 0 refills | Status: DC

## 2022-08-20 MED ORDER — VITAMIN D (ERGOCALCIFEROL) 1.25 MG (50000 UNIT) PO CAPS: 50000.0000 [IU] | ORAL_CAPSULE | ORAL | 0 refills | Status: DC

## 2022-08-26 DIAGNOSIS — Z23 Encounter for immunization: Secondary | ICD-10-CM | POA: Diagnosis not present

## 2022-09-03 NOTE — Progress Notes (Signed)
Chief Complaint:   OBESITY David Frost is here to discuss his progress with his obesity treatment plan along with follow-up of his obesity related diagnoses. David Frost is on the Category 4 Plan and states he is following his eating plan approximately 90% of the time. David Frost states he is riding stationary bike for 30 minutes 3 times per week.  Today's visit was #: 5 Starting weight: 319 lbs Starting date: 05/19/22 Today's weight: 313 lbs Today's date: 08/20/22 Total lbs lost to date: 6 Total lbs lost since last in-office visit: 0  Interim History: Patient is logging food now.  Average 1794 cal and 139 g of protein.  Had a low-key Thanksgiving.  Just found out his sister had a stroke and is unsure of her status.  Subjective:   1. Type 2 diabetes mellitus with hyperglycemia, without long-term current use of insulin (HCC) On 0.25 mg subq Ozempic. No GI symptoms noted on starting dose.  2. Hypertension associated with diabetes ([place]) Just saw PCP last week and blood pressure was within normal limits. Blood pressure elevated today.  No chest pain/chest pressure/headache.  3. Vitamin D deficiency On prescription vitamin D. No nausea, vomiting, muscle weakness.  Assessment/Plan:   1. Type 2 diabetes mellitus with hyperglycemia, without long-term current use of insulin (HCC) Refill and increase dose: - Semaglutide,0.25 or 0.'5MG'$ /DOS, (OZEMPIC, 0.25 OR 0.5 MG/DOSE,) 2 MG/3ML SOPN; Inject 0.5 mg into the skin once a week.  Dispense: 3 mL; Refill: 0  2. Hypertension associated with diabetes ([place]) Follow-up blood pressure at next appointment.  3. Vitamin D deficiency Refill: - Vitamin D, Ergocalciferol, (DRISDOL) 1.25 MG (50000 UNIT) CAPS capsule; Take 1 capsule (50,000 Units total) by mouth every 7 (seven) days.  Dispense: 8 capsule; Refill: 0  4. Obesity with current BMI of 43.7 David Frost is currently in the action stage of change. As such, his goal is to continue with weight loss efforts.  He has agreed to the Category 4 Plan.   Exercise goals: All adults should avoid inactivity. Some physical activity is better than none, and adults who participate in any amount of physical activity gain some health benefits.  Behavioral modification strategies: increasing lean protein intake, meal planning and cooking strategies, keeping healthy foods in the home, and planning for success.  David Frost has agreed to follow-up with our clinic in 3 weeks. He was informed of the importance of frequent follow-up visits to maximize his success with intensive lifestyle modifications for his multiple health conditions.    Objective:   Blood pressure (!) 144/64, pulse 78, temperature 97.8 F (36.6 C), height '5\' 11"'$  (1.803 m), weight (!) 313 lb (142 kg), SpO2 96 %. Body mass index is 43.65 kg/m.  General: Cooperative, alert, well developed, in no acute distress. HEENT: Conjunctivae and lids unremarkable. Cardiovascular: Regular rhythm.  Lungs: Normal work of breathing. Neurologic: No focal deficits.   Lab Results  Component Value Date   CREATININE 1.05 05/27/2022   BUN 31 (H) 05/27/2022   NA 135 05/27/2022   K 4.0 05/27/2022   CL 101 05/27/2022   CO2 24 05/27/2022   Lab Results  Component Value Date   ALT 31 05/19/2022   AST 13 05/19/2022   ALKPHOS 97 05/19/2022   BILITOT 0.7 05/19/2022   Lab Results  Component Value Date   HGBA1C 6.9 (H) 05/19/2022   HGBA1C 6.0 (H) 03/18/2017   Lab Results  Component Value Date   INSULIN 28.2 (H) 05/19/2022   Lab Results  Component Value  Date   TSH 1.903 05/27/2022   Lab Results  Component Value Date   CHOL 145 05/19/2022   HDL 35 (L) 05/19/2022   LDLCALC 55 05/19/2022   TRIG 361 (H) 05/19/2022   CHOLHDL 4.2 07/23/2021   Lab Results  Component Value Date   VD25OH 22.7 (L) 05/19/2022   Lab Results  Component Value Date   WBC 10.2 05/27/2022   HGB 14.6 05/27/2022   HCT 42.1 05/27/2022   MCV 88.4 05/27/2022   PLT 173 05/27/2022    No results found for: "IRON", "TIBC", "FERRITIN"   Attestation Statements:   Reviewed by clinician on day of visit: allergies, medications, problem list, medical history, surgical history, family history, social history, and previous encounter notes.  I, Dawn Whitmire, FNP-C, am acting as Location manager for Coralie Common, MD.  I have reviewed the above documentation for accuracy and completeness, and I agree with the above. - Coralie Common, MD

## 2022-09-09 ENCOUNTER — Other Ambulatory Visit: Payer: Self-pay | Admitting: Cardiovascular Disease

## 2022-09-10 ENCOUNTER — Encounter (INDEPENDENT_AMBULATORY_CARE_PROVIDER_SITE_OTHER): Payer: Self-pay | Admitting: Family Medicine

## 2022-09-10 ENCOUNTER — Ambulatory Visit (INDEPENDENT_AMBULATORY_CARE_PROVIDER_SITE_OTHER): Payer: Medicare Other | Admitting: Family Medicine

## 2022-09-10 VITALS — BP 129/71 | HR 73 | Temp 97.9°F | Ht 71.0 in | Wt 313.0 lb

## 2022-09-10 DIAGNOSIS — E1165 Type 2 diabetes mellitus with hyperglycemia: Secondary | ICD-10-CM

## 2022-09-10 DIAGNOSIS — Z6841 Body Mass Index (BMI) 40.0 and over, adult: Secondary | ICD-10-CM | POA: Diagnosis not present

## 2022-09-10 DIAGNOSIS — E559 Vitamin D deficiency, unspecified: Secondary | ICD-10-CM

## 2022-09-10 DIAGNOSIS — Z7985 Long-term (current) use of injectable non-insulin antidiabetic drugs: Secondary | ICD-10-CM

## 2022-09-10 DIAGNOSIS — E669 Obesity, unspecified: Secondary | ICD-10-CM

## 2022-09-10 MED ORDER — OZEMPIC (0.25 OR 0.5 MG/DOSE) 2 MG/3ML ~~LOC~~ SOPN: 0.5000 mg | PEN_INJECTOR | SUBCUTANEOUS | 0 refills | Status: DC

## 2022-09-10 MED ORDER — VITAMIN D (ERGOCALCIFEROL) 1.25 MG (50000 UNIT) PO CAPS: 50000.0000 [IU] | ORAL_CAPSULE | ORAL | 0 refills | Status: DC

## 2022-09-28 NOTE — Progress Notes (Signed)
Chief Complaint:   OBESITY David Frost is here to discuss his progress with his obesity treatment plan along with follow-up of his obesity related diagnoses. David Frost is on the Category 4 Plan and states he is following his eating plan approximately 80% of the time. David Frost states he is exercising 0 minutes 0 times per week.  Today's visit was #: 6 Starting weight: 319 lbs Starting date: 05/19/2022 Today's weight: 313 lbs Today's date: 09/10/2022 Total lbs lost to date: 6 lbs Total lbs lost since last in-office visit: 0  Interim History: David Frost voices weight is staying in so is full quantity.  Average calories are 1804 and average protein is 102.  Occasionally skipping meals.  Not hungry so then may not eat meals.  Subjective:   1. Type 2 diabetes mellitus with hyperglycemia, without long-term current use of insulin (David Frost) David Frost without GI side effects.  Last A1c increased.  2. Vitamin D deficiency David Frost is currently taking prescription Vit D 50,000 IU once a week. Denies any nausea, vomiting or muscle weakness.  Assessment/Plan:   1. Type 2 diabetes mellitus with hyperglycemia, without long-term current use of insulin (David Frost) We will refill Frost 0.5 mg subcu once weekly for 1 month with 0 refills.  -Refill Semaglutide,0.25 or 0.'5MG'$ /DOS, (Frost, 0.25 OR 0.5 MG/DOSE,) 2 MG/3ML SOPN; Inject 0.5 mg into the skin once a week.  Dispense: 3 mL; Refill: 0  2. Vitamin D deficiency We will refill Vit D 50K IU once a week for 1 month with 0 refills.  Labs at next appointment.  -Refill Vitamin D, Ergocalciferol, (DRISDOL) 1.25 MG (50000 UNIT) CAPS capsule; Take 1 capsule (50,000 Units total) by mouth every 7 (seven) days.  Dispense: 8 capsule; Refill: 0  3. Obesity with current BMI of 43.7 David Frost is currently in the action stage of change. As such, his goal is to continue with weight loss efforts. He has agreed to the Category 4 Plan and keeping a food journal and adhering to  recommended goals of 2000 calories and 150+grams of protein daily.   Exercise goals: No exercise has been prescribed at this time.  Behavioral modification strategies: increasing lean protein intake, meal planning and cooking strategies, keeping healthy foods in the home, and planning for success.  David Frost has agreed to follow-up with our clinic in 4 weeks. He was informed of the importance of frequent follow-up visits to maximize his success with intensive lifestyle modifications for his multiple health conditions.   Objective:   Blood pressure 129/71, pulse 73, temperature 97.9 F (36.6 C), height '5\' 11"'$  (1.803 m), weight (!) 313 lb (142 kg), SpO2 95 %. Body mass index is 43.65 kg/m.  General: Cooperative, alert, well developed, in no acute distress. HEENT: Conjunctivae and lids unremarkable. Cardiovascular: Regular rhythm.  Lungs: Normal work of breathing. Neurologic: No focal deficits.   Lab Results  Component Value Date   CREATININE 1.05 05/27/2022   BUN 31 (H) 05/27/2022   NA 135 05/27/2022   K 4.0 05/27/2022   CL 101 05/27/2022   CO2 24 05/27/2022   Lab Results  Component Value Date   ALT 31 05/19/2022   AST 13 05/19/2022   ALKPHOS 97 05/19/2022   BILITOT 0.7 05/19/2022   Lab Results  Component Value Date   HGBA1C 6.9 (H) 05/19/2022   HGBA1C 6.0 (H) 03/18/2017   Lab Results  Component Value Date   INSULIN 28.2 (H) 05/19/2022   Lab Results  Component Value Date   TSH  1.903 05/27/2022   Lab Results  Component Value Date   CHOL 145 05/19/2022   HDL 35 (L) 05/19/2022   LDLCALC 55 05/19/2022   TRIG 361 (H) 05/19/2022   CHOLHDL 4.2 07/23/2021   Lab Results  Component Value Date   VD25OH 22.7 (L) 05/19/2022   Lab Results  Component Value Date   WBC 10.2 05/27/2022   HGB 14.6 05/27/2022   HCT 42.1 05/27/2022   MCV 88.4 05/27/2022   PLT 173 05/27/2022   No results found for: "IRON", "TIBC", "FERRITIN"  Attestation Statements:   Reviewed by  clinician on day of visit: allergies, medications, problem list, medical history, surgical history, family history, social history, and previous encounter notes.  I, Elnora Morrison, RMA am acting as transcriptionist for Coralie Common, MD. I have reviewed the above documentation for accuracy and completeness, and I agree with the above. - Coralie Common, MD

## 2022-10-08 DIAGNOSIS — G8929 Other chronic pain: Secondary | ICD-10-CM | POA: Diagnosis not present

## 2022-10-08 DIAGNOSIS — M21861 Other specified acquired deformities of right lower leg: Secondary | ICD-10-CM | POA: Diagnosis not present

## 2022-10-08 DIAGNOSIS — M21862 Other specified acquired deformities of left lower leg: Secondary | ICD-10-CM | POA: Diagnosis not present

## 2022-10-08 DIAGNOSIS — M25562 Pain in left knee: Secondary | ICD-10-CM | POA: Diagnosis not present

## 2022-10-08 DIAGNOSIS — M170 Bilateral primary osteoarthritis of knee: Secondary | ICD-10-CM | POA: Diagnosis not present

## 2022-10-08 DIAGNOSIS — M25561 Pain in right knee: Secondary | ICD-10-CM | POA: Diagnosis not present

## 2022-10-15 ENCOUNTER — Ambulatory Visit (INDEPENDENT_AMBULATORY_CARE_PROVIDER_SITE_OTHER): Payer: Medicare Other | Admitting: Family Medicine

## 2022-10-15 ENCOUNTER — Encounter (INDEPENDENT_AMBULATORY_CARE_PROVIDER_SITE_OTHER): Payer: Self-pay | Admitting: Family Medicine

## 2022-10-15 VITALS — BP 159/68 | HR 97 | Temp 98.1°F | Ht 71.0 in | Wt 305.0 lb

## 2022-10-15 DIAGNOSIS — E669 Obesity, unspecified: Secondary | ICD-10-CM | POA: Diagnosis not present

## 2022-10-15 DIAGNOSIS — Z6841 Body Mass Index (BMI) 40.0 and over, adult: Secondary | ICD-10-CM

## 2022-10-15 DIAGNOSIS — E559 Vitamin D deficiency, unspecified: Secondary | ICD-10-CM

## 2022-10-15 DIAGNOSIS — E1169 Type 2 diabetes mellitus with other specified complication: Secondary | ICD-10-CM | POA: Diagnosis not present

## 2022-10-15 DIAGNOSIS — E785 Hyperlipidemia, unspecified: Secondary | ICD-10-CM | POA: Diagnosis not present

## 2022-10-15 DIAGNOSIS — Z7985 Long-term (current) use of injectable non-insulin antidiabetic drugs: Secondary | ICD-10-CM

## 2022-10-15 DIAGNOSIS — E1165 Type 2 diabetes mellitus with hyperglycemia: Secondary | ICD-10-CM

## 2022-10-17 LAB — COMPREHENSIVE METABOLIC PANEL
ALT: 45 IU/L — ABNORMAL HIGH (ref 0–44)
AST: 17 IU/L (ref 0–40)
Albumin/Globulin Ratio: 1.8 (ref 1.2–2.2)
Albumin: 4.3 g/dL (ref 3.8–4.8)
Alkaline Phosphatase: 66 IU/L (ref 44–121)
BUN/Creatinine Ratio: 33 — ABNORMAL HIGH (ref 10–24)
BUN: 31 mg/dL — ABNORMAL HIGH (ref 8–27)
Bilirubin Total: 0.6 mg/dL (ref 0.0–1.2)
CO2: 23 mmol/L (ref 20–29)
Calcium: 9.1 mg/dL (ref 8.6–10.2)
Chloride: 97 mmol/L (ref 96–106)
Creatinine, Ser: 0.93 mg/dL (ref 0.76–1.27)
Globulin, Total: 2.4 g/dL (ref 1.5–4.5)
Glucose: 103 mg/dL — ABNORMAL HIGH (ref 70–99)
Potassium: 5.2 mmol/L (ref 3.5–5.2)
Sodium: 135 mmol/L (ref 134–144)
Total Protein: 6.7 g/dL (ref 6.0–8.5)
eGFR: 87 mL/min/{1.73_m2} (ref >59)

## 2022-10-17 LAB — LIPID PANEL WITH LDL/HDL RATIO
Cholesterol, Total: 168 mg/dL (ref 100–199)
HDL: 49 mg/dL (ref >39)
LDL Chol Calc (NIH): 75 mg/dL (ref 0–99)
LDL/HDL Ratio: 1.5 ratio (ref 0.0–3.6)
Triglycerides: 275 mg/dL — ABNORMAL HIGH (ref 0–149)
VLDL Cholesterol Cal: 44 mg/dL — ABNORMAL HIGH (ref 5–40)

## 2022-10-17 LAB — VITAMIN D 25 HYDROXY (VIT D DEFICIENCY, FRACTURES): Vit D, 25-Hydroxy: 27.8 ng/mL — ABNORMAL LOW (ref 30.0–100.0)

## 2022-10-17 LAB — HEMOGLOBIN A1C
Est. average glucose Bld gHb Est-mCnc: 143 mg/dL
Hgb A1c MFr Bld: 6.6 % — ABNORMAL HIGH (ref 4.8–5.6)

## 2022-10-17 LAB — INSULIN, RANDOM: INSULIN: 13.7 u[IU]/mL (ref 2.6–24.9)

## 2022-10-21 ENCOUNTER — Ambulatory Visit: Payer: Medicare Other | Attending: Cardiovascular Disease | Admitting: Cardiovascular Disease

## 2022-10-21 ENCOUNTER — Encounter: Payer: Self-pay | Admitting: Cardiovascular Disease

## 2022-10-21 VITALS — BP 142/60 | HR 84 | Ht 72.0 in | Wt 314.6 lb

## 2022-10-21 DIAGNOSIS — I10 Essential (primary) hypertension: Secondary | ICD-10-CM

## 2022-10-21 DIAGNOSIS — I4891 Unspecified atrial fibrillation: Secondary | ICD-10-CM | POA: Diagnosis not present

## 2022-10-21 DIAGNOSIS — I4510 Unspecified right bundle-branch block: Secondary | ICD-10-CM | POA: Diagnosis not present

## 2022-10-21 DIAGNOSIS — I214 Non-ST elevation (NSTEMI) myocardial infarction: Secondary | ICD-10-CM

## 2022-10-21 DIAGNOSIS — E785 Hyperlipidemia, unspecified: Secondary | ICD-10-CM | POA: Diagnosis not present

## 2022-10-21 DIAGNOSIS — G4733 Obstructive sleep apnea (adult) (pediatric): Secondary | ICD-10-CM | POA: Diagnosis not present

## 2022-10-21 DIAGNOSIS — Z6841 Body Mass Index (BMI) 40.0 and over, adult: Secondary | ICD-10-CM | POA: Diagnosis not present

## 2022-10-21 DIAGNOSIS — E66813 Obesity, class 3: Secondary | ICD-10-CM

## 2022-10-21 NOTE — Assessment & Plan Note (Signed)
History of essential hypertension blood pressure measured today at 142/60.  He is on metoprolol, Benicar and hydrochlorothiazide.

## 2022-10-21 NOTE — Progress Notes (Signed)
10/21/2022 David Frost   May 05, 1951  [id number]  Primary Physician Donnajean Lopes, MD Primary Cardiologist: Lorretta Harp MD FACP, [place], [place], [place]  HPI:  David Frost is a 72 y.o.  severely overweight divorced Caucasian male father of 2 sons David Frost and David Frost)  who I last saw saw in the office 07/23/2021. He also sees Dr. Debara Pickett in the lipid clinic as recently as last month.  Has a history of obesity, hypertension, chronic right bundle branch  block and symptoms compatible sleep apnea. He has obstructive sleep apnea on CPAP. He does admit to dietary indiscretion. He says that he loved bagels and salt. He had a Myoview stress test in 2007 which was low risk and a 2-D echo to and 10 that showed normal LV function. He had a non-STEMI in [place] 03/15/17 and was transferred to [facility] where he underwent cardiac catheterization revealing multivessel disease. He was then transferred to [facility] where he underwent coronary artery bypass grafting 6 by Dr. Servando Snare on 03/22/17. His postoperative course was uncomplicated. Soon after discharge she was remitted with A. fib with RVR from which he was symptomatic from and was rate controlled, placed on amiodarone and oral anticoagulation.   On 05/04/17 he underwent outpatient DC cardioversion by Dr. Oval Linsey successfully to sinus rhythm with one shock. He has done well since.  Unfortunately, he has not been able to lose weight over the last year.  He has not exercised either he does have sleep apnea but does not use his CPAP.  He sleeps in a chair.   He has been seen by Dr. Debara Pickett in the lipid clinic and is changing his diet.  His lipid profile has improved as have his triglycerides.  Continues to drink wine on a daily basis but does not drink caffeine.  He was seen in the ER in [place] this past Wednesday for hypotension, A. fib with RVR.  Apparently he was taking his olmesartan at twice the dose mistakenly.  He  was placed on IV infusion and converted in 4 hours.   Since I saw him in the office a year ago he continues to do well.  I had referred him to the [facility] diet and wellness center which he currently is being followed by with intent to lose enough weight to undergo bilateral knee replacement.  He has had no recurrent A-fib.  He otherwise denies chest pain or shortness of breath.     Current Meds  Medication Sig   acetaminophen (TYLENOL) 325 MG tablet Take 2 tablets (650 mg total) by mouth every 6 (six) hours as needed for mild pain.   ALPRAZolam (XANAX) 0.25 MG tablet Take 1 tablet (0.25 mg total) by mouth 2 (two) times daily as needed for anxiety.   aspirin EC 81 MG tablet Take 1 tablet (81 mg total) by mouth daily.   atorvastatin (LIPITOR) 40 MG tablet TAKE 1 TABLET(40 MG) BY MOUTH DAILY   bimatoprost (LUMIGAN) 0.01 % SOLN Lumigan 0.01 % eye drops  INSTILL 1 DROP INTO LEFT EYE EVERY NIGHT AT BEDTIME   icosapent Ethyl (VASCEPA) 1 g capsule TAKE 2 CAPSULES(2 GRAMS) BY MOUTH TWICE DAILY   loratadine (CLARITIN) 10 MG tablet Take 10 mg by mouth daily.   metoprolol succinate (TOPROL-XL) 50 MG 24 hr tablet Take 1 tablet (50 mg total) by mouth daily.   Multiple Vitamins-Minerals (PRESERVISION AREDS 2 PO) Take 1 capsule by mouth 2 (two) times  daily.   nitroGLYCERIN (NITROSTAT) 0.4 MG SL tablet PLACE 1 TABLET UNDER THE TONGUE EVERY 5 MINUTES AS NEEDED FOR CHEST PAIN   olmesartan-hydrochlorothiazide (BENICAR HCT) 40-12.5 MG tablet Take 1 tablet by mouth every morning.   PROAIR RESPICLICK 500 (90 Base) MCG/ACT AEPB Take 2 puffs by mouth as directed.   Semaglutide,0.25 or 0.'5MG'$ /DOS, (OZEMPIC, 0.25 OR 0.5 MG/DOSE,) 2 MG/3ML SOPN Inject 0.5 mg into the skin once a week.   Vitamin D, Ergocalciferol, (DRISDOL) 1.25 MG (50000 UNIT) CAPS capsule Take 1 capsule (50,000 Units total) by mouth every 7 (seven) days.     Allergies  Allergen Reactions   Zolpidem     Other Reaction(s): Unknown    Social History    Socioeconomic History   Marital status: Divorced    Spouse name: Not on file   Number of children: Not on file   Years of education: Not on file   Highest education level: Not on file  Occupational History   Occupation: Retired Armed forces operational officer, still work part time  Tobacco Use   Smoking status: Former    Types: Cigars    Quit date: 03/08/2017    Years since quitting: 5.6   Smokeless tobacco: Never  Vaping Use   Vaping Use: Never used  Substance and Sexual Activity   Alcohol use: No    Alcohol/week: 70.0 standard drinks of alcohol    Types: 70 Cans of beer per week    Comment: pt states not drinking since CABG   Drug use: No   Sexual activity: Not on file  Other Topics Concern   Not on file  Social History Narrative   Not on file   Social Determinants of Health   Financial Resource Strain: Not on file  Food Insecurity: Not on file  Transportation Needs: Not on file  Physical Activity: Not on file  Stress: Not on file  Social Connections: Not on file  Intimate Partner Violence: Not on file     Review of Systems: General: negative for chills, fever, night sweats or weight changes.  Cardiovascular: negative for chest pain, dyspnea on exertion, edema, orthopnea, palpitations, paroxysmal nocturnal dyspnea or shortness of breath Dermatological: negative for rash Respiratory: negative for cough or wheezing Urologic: negative for hematuria Abdominal: negative for nausea, vomiting, diarrhea, bright red blood per rectum, melena, or hematemesis Neurologic: negative for visual changes, syncope, or dizziness All other systems reviewed and are otherwise negative except as noted above.    Blood pressure (!) 142/60, pulse 84, height 6' (1.829 m), weight (!) 314 lb 9.6 oz (142.7 kg), SpO2 96 %.  General appearance: alert and no distress Neck: no adenopathy, no carotid bruit, no JVD, supple, symmetrical, trachea midline, and thyroid not enlarged, symmetric, no  tenderness/mass/nodules Lungs: clear to auscultation bilaterally Heart: regular rate and rhythm, S1, S2 normal, no murmur, click, rub or gallop Extremities: extremities normal, atraumatic, no cyanosis or edema Pulses: 2+ and symmetric Skin: Skin color, texture, turgor normal. No rashes or lesions Neurologic: Grossly normal  EKG sinus rhythm at 84 with left axis deviation and right bundle branch block.  I personally reviewed this EKG.  ASSESSMENT AND PLAN:   Obesity, unspecified History of morbid obesity with a BMI of 42.  He is being seen by the [facility] for weight loss.  Essential hypertension History of essential hypertension blood pressure measured today at 142/60.  He is on metoprolol, Benicar and hydrochlorothiazide.  Obstructive sleep apnea History of obstructive sleep apnea on BiPAP.  Right bundle branch block Chronic  NSTEMI (non-ST elevated myocardial infarction) ([place]) History of CAD status post coronary bypass grafting x 6 by Dr. Servando Snare 03/22/2017 after cardiac catheterization revealed multivessel disease.  He has been asymptomatic from this.  Atrial fibrillation with RVR (HCC) History of PAF maintaining sinus rhythm currently not on oral anticoagulation.  Hyperlipidemia LDL goal <70 History of on statin therapy with lipid profile performed 10/15/2022 revealing total cholesterol 168, LDL 75 and HDL 49.     Lorretta Harp MD FACP,FACC,FAHA, [facility] 10/21/2022 12:08 PM

## 2022-10-21 NOTE — Assessment & Plan Note (Signed)
History of on statin therapy with lipid profile performed 10/15/2022 revealing total cholesterol 168, LDL 75 and HDL 49.

## 2022-10-21 NOTE — Assessment & Plan Note (Signed)
History of PAF maintaining sinus rhythm currently not on oral anticoagulation.

## 2022-10-21 NOTE — Assessment & Plan Note (Signed)
History of CAD status post coronary bypass grafting x 6 by Dr. Servando Snare 03/22/2017 after cardiac catheterization revealed multivessel disease.  He has been asymptomatic from this.

## 2022-10-21 NOTE — Assessment & Plan Note (Signed)
History of morbid obesity with a BMI of 42.  He is being seen by the [facility] for weight loss.

## 2022-10-21 NOTE — Patient Instructions (Signed)

## 2022-10-21 NOTE — Assessment & Plan Note (Signed)
History of obstructive sleep apnea on BiPAP 

## 2022-10-21 NOTE — Assessment & Plan Note (Signed)
Chronic. 

## 2022-10-22 ENCOUNTER — Other Ambulatory Visit (INDEPENDENT_AMBULATORY_CARE_PROVIDER_SITE_OTHER): Payer: Self-pay | Admitting: Family Medicine

## 2022-10-22 DIAGNOSIS — E1165 Type 2 diabetes mellitus with hyperglycemia: Secondary | ICD-10-CM

## 2022-10-26 ENCOUNTER — Encounter (INDEPENDENT_AMBULATORY_CARE_PROVIDER_SITE_OTHER): Payer: Self-pay

## 2022-10-26 ENCOUNTER — Other Ambulatory Visit (INDEPENDENT_AMBULATORY_CARE_PROVIDER_SITE_OTHER): Payer: Self-pay

## 2022-10-26 ENCOUNTER — Telehealth (INDEPENDENT_AMBULATORY_CARE_PROVIDER_SITE_OTHER): Payer: Self-pay | Admitting: Family Medicine

## 2022-10-26 DIAGNOSIS — E1165 Type 2 diabetes mellitus with hyperglycemia: Secondary | ICD-10-CM

## 2022-10-26 MED ORDER — OZEMPIC (0.25 OR 0.5 MG/DOSE) 2 MG/3ML ~~LOC~~ SOPN: 0.5000 mg | PEN_INJECTOR | SUBCUTANEOUS | 0 refills | Status: DC

## 2022-10-26 NOTE — Telephone Encounter (Signed)
Pt called stating that he needs a refill of Ozempic. Pt is completely out of medication. Please call pt at [phone].

## 2022-10-26 NOTE — Telephone Encounter (Signed)
Message sent to pt-CS

## 2022-10-27 NOTE — Progress Notes (Unsigned)
Chief Complaint:   OBESITY Allyn is here to discuss his progress with his obesity treatment plan along with follow-up of his obesity related diagnoses. Amilcar is on the Category 4 Plan and keeping a food journal and adhering to recommended goals of 2000 calories and 180 grams of protein and states he is following his eating plan approximately 100% of the time. Braylon states he is stationary bike 20 minutes 3 times per week.  Today's visit was #: 7 Starting weight: 319 lbs Starting date: 05/19/2022 Today's weight: 305 lbs Today's date: 10/15/2022 Total lbs lost to date: 14 lbs Total lbs lost since last in-office visit: 8  Interim History: Hans stayed local for the holidays and spent time with his family.  He is still consistently low on calories and protein.  Feels somewhat disappointed in weight loss.  Still looking to get to BMI of 40.  Subjective:   1. Type 2 diabetes mellitus with hyperglycemia, without long-term current use of insulin (HCC) A1c 6.9, insulin 28.2.  On Ozempic 0.5 mg subQ.  2. Vitamin D deficiency Wong is currently taking prescription Vit D 50,000 IU once a week.  Denies any nausea, vomiting or muscle weakness.  He notes fatigue.  3. Hyperlipidemia associated with type 2 diabetes mellitus ([place]) Wyley is on Vascepa and Lipitor.  No side effects noted.  Assessment/Plan:   1. Type 2 diabetes mellitus with hyperglycemia, without long-term current use of insulin (HCC) We will obtain labs today.  Will refill Ozempic 0.5 mg SubQ weekly for 1 month with 0 refills.  - Comprehensive metabolic panel - Hemoglobin A1c - Insulin, random  2. Vitamin D deficiency We will obtain labs today.  - VITAMIN D 25 Hydroxy (Vit-D Deficiency, Fractures)  3. Hyperlipidemia associated with type 2 diabetes mellitus ([place]) We will obtain labs today.  - Lipid Panel With LDL/HDL Ratio  4. Obesity with current BMI of 42.6 Coreon is currently in the action stage of change. As  such, his goal is to continue with weight loss efforts. He has agreed to the Category 4 Plan and keeping a food journal and adhering to recommended goals of 2000 calories and 150+ grams of protein daily.   Exercise goals: Older adults should follow the adult guidelines. When older adults cannot meet the adult guidelines, they should be as physically active as their abilities and conditions will allow.   Behavioral modification strategies: increasing lean protein intake, meal planning and cooking strategies, keeping healthy foods in the home, avoiding temptations, and planning for success.  Aristidis has agreed to follow-up with our clinic in 4 weeks. He was informed of the importance of frequent follow-up visits to maximize his success with intensive lifestyle modifications for his multiple health conditions.   Darrnell was informed we would discuss his lab results at his next visit unless there is a critical issue that needs to be addressed sooner. Marlin agreed to keep his next visit at the agreed upon time to discuss these results.  Objective:   Blood pressure (!) 159/68, pulse 97, temperature 98.1 F (36.7 C), height '5\' 11"'$  (1.803 m), weight (!) 305 lb (138.3 kg), SpO2 99 %. Body mass index is 42.54 kg/m.  General: Cooperative, alert, well developed, in no acute distress. HEENT: Conjunctivae and lids unremarkable. Cardiovascular: Regular rhythm.  Lungs: Normal work of breathing. Neurologic: No focal deficits.   Lab Results  Component Value Date   CREATININE 0.93 10/15/2022   BUN 31 (H) 10/15/2022   NA 135 10/15/2022  K 5.2 10/15/2022   CL 97 10/15/2022   CO2 23 10/15/2022   Lab Results  Component Value Date   ALT 45 (H) 10/15/2022   AST 17 10/15/2022   ALKPHOS 66 10/15/2022   BILITOT 0.6 10/15/2022   Lab Results  Component Value Date   HGBA1C 6.6 (H) 10/15/2022   HGBA1C 6.9 (H) 05/19/2022   HGBA1C 6.0 (H) 03/18/2017   Lab Results  Component Value Date   INSULIN 13.7  10/15/2022   INSULIN 28.2 (H) 05/19/2022   Lab Results  Component Value Date   TSH 1.903 05/27/2022   Lab Results  Component Value Date   CHOL 168 10/15/2022   HDL 49 10/15/2022   LDLCALC 75 10/15/2022   TRIG 275 (H) 10/15/2022   CHOLHDL 4.2 07/23/2021   Lab Results  Component Value Date   VD25OH 27.8 (L) 10/15/2022   VD25OH 22.7 (L) 05/19/2022   Lab Results  Component Value Date   WBC 10.2 05/27/2022   HGB 14.6 05/27/2022   HCT 42.1 05/27/2022   MCV 88.4 05/27/2022   PLT 173 05/27/2022   No results found for: "IRON", "TIBC", "FERRITIN"  Attestation Statements:   Reviewed by clinician on day of visit: allergies, medications, problem list, medical history, surgical history, family history, social history, and previous encounter notes.  I, Elnora Morrison, RMA am acting as transcriptionist for Coralie Common, MD.  I have reviewed the above documentation for accuracy and completeness, and I agree with the above. - Coralie Common, MD

## 2022-11-10 ENCOUNTER — Ambulatory Visit (INDEPENDENT_AMBULATORY_CARE_PROVIDER_SITE_OTHER): Payer: Medicare Other | Admitting: Physician Assistant

## 2022-11-10 ENCOUNTER — Encounter (INDEPENDENT_AMBULATORY_CARE_PROVIDER_SITE_OTHER): Payer: Self-pay | Admitting: Physician Assistant

## 2022-11-10 VITALS — BP 134/81 | HR 69 | Temp 97.7°F | Ht 71.0 in | Wt 309.0 lb

## 2022-11-10 DIAGNOSIS — E1165 Type 2 diabetes mellitus with hyperglycemia: Secondary | ICD-10-CM

## 2022-11-10 DIAGNOSIS — E669 Obesity, unspecified: Secondary | ICD-10-CM

## 2022-11-10 DIAGNOSIS — Z6841 Body Mass Index (BMI) 40.0 and over, adult: Secondary | ICD-10-CM | POA: Diagnosis not present

## 2022-11-10 DIAGNOSIS — E559 Vitamin D deficiency, unspecified: Secondary | ICD-10-CM | POA: Diagnosis not present

## 2022-11-10 DIAGNOSIS — Z7985 Long-term (current) use of injectable non-insulin antidiabetic drugs: Secondary | ICD-10-CM | POA: Diagnosis not present

## 2022-11-10 NOTE — Progress Notes (Signed)
Chief Complaint:   OBESITY David Frost is here to discuss his progress with his obesity treatment plan along with follow-up of his obesity related diagnoses. David Frost is on the Category 4 Plan and states he is following his eating plan approximately 85-90% of the time. David Frost states he is riding stationary bike 30 minutes 3 times per week.  Today's visit was #: 8 Starting weight: 319 lbs Starting date: 05/19/2022 Today's weight: 309 lbs Today's date: 11/10/2022 Total lbs lost to date: 9 lbs Total lbs lost since last in-office visit: + 4 lbs  Interim History: David Frost is doing better meeting calorie and protein needs. Calories ~ 2000/Protein 140-150 grams daily.  Hunger is not well controlled and stays hungry despite Ozempic 0.5 mg weekly. Reports has taken Ozempic for several years and has never felt it helped control his hunger.  Has quit drinking any wine for the past 1-2 months.  Eating very little bread at times- a bagel at times. Does not like David Frost delightful bread.  We discussed having the right mix of protein to carbohydrate to fat to promote weight loss.  He is frustrated by the slow progress with weight loss.  We also discussed he may have better suppression of appetite/hunger on [facility] and he would like to discuss further with David Frost at the next visit.  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 Goal of BMI of 40 to have knee replacement.   Subjective:   1. Type 2 diabetes mellitus with hyperglycemia, without long-term current use of insulin (HCC) A1c 6.9/Insulin 28.2- not at goal. On Ozempic 0.5 mg weekly. No side effects with Ozempic, but also does not note any weight loss since started Ozempic several years ago.   2. Vitamin D deficiency Vit D level 27.8- Not at goal. On Ergocalciferol 50,000 IU weekly. Reports no side effects with Ergocalciferol  3. Obesity ([place])- Start BMI 44.49   4. BMI 40.0-44.9, adult (HCC)  Current BMI 43.1    Assessment/Plan:   1. Type 2 diabetes mellitus with hyperglycemia, without long-term current use of insulin (HCC) Continue Ozempic 0.5 mg weekly.  Continue to work on decreasing simple carbohydrates, increasing lean protein and exercise to promote weight loss and improve glycemic control.  He would like to possibly consider switching to [facility] as noted.   2. Vitamin D deficiency Continue Ergocalciferol 50,000 IU weekly. Recheck vitamin D level 2-3 times yearly to avoid over supplementation.   3. Obesity ([place])- Start BMI 44.49   4. BMI 40.0-44.9, adult ([place]) Current BMI 43.1   David Frost is currently in the action stage of change. As such, his goal is to continue with weight loss efforts. He has agreed to the Category 4 Plan and keeping a food journal and adhering to recommended goals of 2000 calories and 140+ grams of protein.   Exercise goals: Older adults should follow the adult guidelines. When older adults cannot meet the adult guidelines, they should be as physically active as their abilities and conditions will allow.   Behavioral modification strategies: increasing lean protein intake, decreasing simple carbohydrates, avoiding temptations, planning for success, and keeping a strict food journal.  David Frost has agreed to follow-up with our clinic in 2 weeks. He was informed of the importance of frequent follow-up visits to maximize his success with intensive lifestyle modifications for his multiple health conditions.     Objective:   Blood pressure 134/81, pulse 69, temperature 97.7 F (36.5 C), height  $5' 11"p$  (1.803 m), weight (!) 309 lb (140.2 kg), SpO2 97 %. Body mass index is 43.1 kg/m.  General: Cooperative, alert, well developed, in no acute distress. HEENT: Conjunctivae and lids unremarkable. Cardiovascular: Regular rhythm.  Lungs: Normal work of breathing. Neurologic: No focal deficits.   Lab Results  Component Value Date   CREATININE 0.93  10/15/2022   BUN 31 (H) 10/15/2022   NA 135 10/15/2022   K 5.2 10/15/2022   CL 97 10/15/2022   CO2 23 10/15/2022   Lab Results  Component Value Date   ALT 45 (H) 10/15/2022   AST 17 10/15/2022   ALKPHOS 66 10/15/2022   BILITOT 0.6 10/15/2022   Lab Results  Component Value Date   HGBA1C 6.6 (H) 10/15/2022   HGBA1C 6.9 (H) 05/19/2022   HGBA1C 6.0 (H) 03/18/2017   Lab Results  Component Value Date   INSULIN 13.7 10/15/2022   INSULIN 28.2 (H) 05/19/2022   Lab Results  Component Value Date   TSH 1.903 05/27/2022   Lab Results  Component Value Date   CHOL 168 10/15/2022   HDL 49 10/15/2022   LDLCALC 75 10/15/2022   TRIG 275 (H) 10/15/2022   CHOLHDL 4.2 07/23/2021   Lab Results  Component Value Date   VD25OH 27.8 (L) 10/15/2022   VD25OH 22.7 (L) 05/19/2022   Lab Results  Component Value Date   WBC 10.2 05/27/2022   HGB 14.6 05/27/2022   HCT 42.1 05/27/2022   MCV 88.4 05/27/2022   PLT 173 05/27/2022   No results found for: "IRON", "TIBC", "FERRITIN"  Obesity Behavioral Intervention:   Approximately 15 minutes were spent on the discussion below.  ASK: We discussed the diagnosis of obesity with David Frost today and Md. agreed to give [place] permission to discuss obesity behavioral modification therapy today.  ASSESS: David Frost has the diagnosis of obesity and his BMI today is 43.1. David Frost is in the action stage of change.   ADVISE: David Frost was educated on the multiple health risks of obesity as well as the benefit of weight loss to improve his health. He was advised of the need for long term treatment and the importance of lifestyle modifications to improve his current health and to decrease his risk of future health problems.  AGREE: Multiple dietary modification options and treatment options were discussed and David Frost agreed to follow the recommendations documented in the above note.  ARRANGE: David Frost was educated on the importance of frequent visits to treat obesity  as outlined per CMS and USPSTF guidelines and agreed to schedule his next follow up appointment today.  Attestation Statements:   Reviewed by clinician on day of visit: allergies, medications, problem list, medical history, surgical history, family history, social history, and previous encounter notes.  Time spent on visit including pre-visit chart review and post-visit care and charting was 35 minutes.

## 2022-11-11 ENCOUNTER — Ambulatory Visit (INDEPENDENT_AMBULATORY_CARE_PROVIDER_SITE_OTHER): Payer: Medicare Other | Admitting: Family Medicine

## 2022-11-16 ENCOUNTER — Ambulatory Visit (INDEPENDENT_AMBULATORY_CARE_PROVIDER_SITE_OTHER): Payer: Medicare Other | Admitting: Family Medicine

## 2022-11-21 ENCOUNTER — Other Ambulatory Visit: Payer: Self-pay | Admitting: Cardiovascular Disease

## 2022-11-21 ENCOUNTER — Other Ambulatory Visit (INDEPENDENT_AMBULATORY_CARE_PROVIDER_SITE_OTHER): Payer: Self-pay | Admitting: Family Medicine

## 2022-11-21 DIAGNOSIS — E1165 Type 2 diabetes mellitus with hyperglycemia: Secondary | ICD-10-CM

## 2022-11-24 ENCOUNTER — Ambulatory Visit (INDEPENDENT_AMBULATORY_CARE_PROVIDER_SITE_OTHER): Payer: BLUE CROSS/BLUE SHIELD | Admitting: Family Medicine

## 2022-11-25 ENCOUNTER — Other Ambulatory Visit: Payer: Self-pay | Admitting: Cardiovascular Disease

## 2022-12-10 DIAGNOSIS — I872 Venous insufficiency (chronic) (peripheral): Secondary | ICD-10-CM | POA: Diagnosis not present

## 2023-01-12 DIAGNOSIS — I872 Venous insufficiency (chronic) (peripheral): Secondary | ICD-10-CM | POA: Diagnosis not present

## 2023-02-04 DIAGNOSIS — M170 Bilateral primary osteoarthritis of knee: Secondary | ICD-10-CM | POA: Diagnosis not present

## 2023-02-04 DIAGNOSIS — H401131 Primary open-angle glaucoma, bilateral, mild stage: Secondary | ICD-10-CM | POA: Diagnosis not present

## 2023-02-04 DIAGNOSIS — H2512 Age-related nuclear cataract, left eye: Secondary | ICD-10-CM | POA: Diagnosis not present

## 2023-02-04 DIAGNOSIS — H5211 Myopia, right eye: Secondary | ICD-10-CM | POA: Diagnosis not present

## 2023-02-04 DIAGNOSIS — H5202 Hypermetropia, left eye: Secondary | ICD-10-CM | POA: Diagnosis not present

## 2023-03-03 DIAGNOSIS — I87311 Chronic venous hypertension (idiopathic) with ulcer of right lower extremity: Secondary | ICD-10-CM | POA: Diagnosis not present

## 2023-03-03 DIAGNOSIS — L97919 Non-pressure chronic ulcer of unspecified part of right lower leg with unspecified severity: Secondary | ICD-10-CM | POA: Diagnosis not present

## 2023-03-03 DIAGNOSIS — I87312 Chronic venous hypertension (idiopathic) with ulcer of left lower extremity: Secondary | ICD-10-CM | POA: Diagnosis not present

## 2023-03-03 DIAGNOSIS — L97929 Non-pressure chronic ulcer of unspecified part of left lower leg with unspecified severity: Secondary | ICD-10-CM | POA: Diagnosis not present

## 2023-03-03 DIAGNOSIS — E1151 Type 2 diabetes mellitus with diabetic peripheral angiopathy without gangrene: Secondary | ICD-10-CM | POA: Diagnosis not present

## 2023-03-03 DIAGNOSIS — E559 Vitamin D deficiency, unspecified: Secondary | ICD-10-CM | POA: Diagnosis not present

## 2023-03-10 ENCOUNTER — Ambulatory Visit (HOSPITAL_BASED_OUTPATIENT_CLINIC_OR_DEPARTMENT_OTHER): Payer: BLUE CROSS/BLUE SHIELD | Admitting: Physician Assistant

## 2023-03-10 DIAGNOSIS — L97919 Non-pressure chronic ulcer of unspecified part of right lower leg with unspecified severity: Secondary | ICD-10-CM | POA: Diagnosis not present

## 2023-03-10 DIAGNOSIS — E1151 Type 2 diabetes mellitus with diabetic peripheral angiopathy without gangrene: Secondary | ICD-10-CM | POA: Diagnosis not present

## 2023-03-10 DIAGNOSIS — I87311 Chronic venous hypertension (idiopathic) with ulcer of right lower extremity: Secondary | ICD-10-CM | POA: Diagnosis not present

## 2023-03-10 DIAGNOSIS — I87312 Chronic venous hypertension (idiopathic) with ulcer of left lower extremity: Secondary | ICD-10-CM | POA: Diagnosis not present

## 2023-03-10 DIAGNOSIS — L97929 Non-pressure chronic ulcer of unspecified part of left lower leg with unspecified severity: Secondary | ICD-10-CM | POA: Diagnosis not present

## 2023-05-07 DIAGNOSIS — I10 Essential (primary) hypertension: Secondary | ICD-10-CM | POA: Diagnosis not present

## 2023-05-07 DIAGNOSIS — Z1212 Encounter for screening for malignant neoplasm of rectum: Secondary | ICD-10-CM | POA: Diagnosis not present

## 2023-05-07 DIAGNOSIS — Z125 Encounter for screening for malignant neoplasm of prostate: Secondary | ICD-10-CM | POA: Diagnosis not present

## 2023-05-07 DIAGNOSIS — E782 Mixed hyperlipidemia: Secondary | ICD-10-CM | POA: Diagnosis not present

## 2023-05-07 DIAGNOSIS — E1151 Type 2 diabetes mellitus with diabetic peripheral angiopathy without gangrene: Secondary | ICD-10-CM | POA: Diagnosis not present

## 2023-05-10 DIAGNOSIS — I25810 Atherosclerosis of coronary artery bypass graft(s) without angina pectoris: Secondary | ICD-10-CM | POA: Diagnosis not present

## 2023-05-10 DIAGNOSIS — E782 Mixed hyperlipidemia: Secondary | ICD-10-CM | POA: Diagnosis not present

## 2023-05-10 DIAGNOSIS — I252 Old myocardial infarction: Secondary | ICD-10-CM | POA: Diagnosis not present

## 2023-05-10 DIAGNOSIS — M170 Bilateral primary osteoarthritis of knee: Secondary | ICD-10-CM | POA: Diagnosis not present

## 2023-05-10 DIAGNOSIS — J310 Chronic rhinitis: Secondary | ICD-10-CM | POA: Diagnosis not present

## 2023-05-10 DIAGNOSIS — Z1331 Encounter for screening for depression: Secondary | ICD-10-CM | POA: Diagnosis not present

## 2023-05-10 DIAGNOSIS — Z1339 Encounter for screening examination for other mental health and behavioral disorders: Secondary | ICD-10-CM | POA: Diagnosis not present

## 2023-05-10 DIAGNOSIS — R82998 Other abnormal findings in urine: Secondary | ICD-10-CM | POA: Diagnosis not present

## 2023-05-10 DIAGNOSIS — M1711 Unilateral primary osteoarthritis, right knee: Secondary | ICD-10-CM | POA: Diagnosis not present

## 2023-05-10 DIAGNOSIS — I872 Venous insufficiency (chronic) (peripheral): Secondary | ICD-10-CM | POA: Diagnosis not present

## 2023-05-10 DIAGNOSIS — E1151 Type 2 diabetes mellitus with diabetic peripheral angiopathy without gangrene: Secondary | ICD-10-CM | POA: Diagnosis not present

## 2023-05-10 DIAGNOSIS — Z0000 Encounter for general adult medical examination without abnormal findings: Secondary | ICD-10-CM | POA: Diagnosis not present

## 2023-05-10 DIAGNOSIS — G4733 Obstructive sleep apnea (adult) (pediatric): Secondary | ICD-10-CM | POA: Diagnosis not present

## 2023-05-10 DIAGNOSIS — I739 Peripheral vascular disease, unspecified: Secondary | ICD-10-CM | POA: Diagnosis not present

## 2023-05-10 DIAGNOSIS — I10 Essential (primary) hypertension: Secondary | ICD-10-CM | POA: Diagnosis not present

## 2023-05-17 ENCOUNTER — Emergency Department (HOSPITAL_BASED_OUTPATIENT_CLINIC_OR_DEPARTMENT_OTHER): Payer: Medicare Other

## 2023-05-17 ENCOUNTER — Other Ambulatory Visit: Payer: Self-pay

## 2023-05-17 ENCOUNTER — Inpatient Hospital Stay (HOSPITAL_BASED_OUTPATIENT_CLINIC_OR_DEPARTMENT_OTHER)
Admission: EM | Admit: 2023-05-17 | Discharge: 2023-05-20 | DRG: 308 | Disposition: A | Discharge status: Home / Self Care | Payer: Medicare Other | Attending: Internal Medicine | Admitting: Internal Medicine

## 2023-05-17 DIAGNOSIS — Z7982 Long term (current) use of aspirin: Secondary | ICD-10-CM

## 2023-05-17 DIAGNOSIS — E785 Hyperlipidemia, unspecified: Secondary | ICD-10-CM | POA: Diagnosis not present

## 2023-05-17 DIAGNOSIS — F411 Generalized anxiety disorder: Secondary | ICD-10-CM | POA: Diagnosis not present

## 2023-05-17 DIAGNOSIS — R6510 Systemic inflammatory response syndrome (SIRS) of non-infectious origin without acute organ dysfunction: Secondary | ICD-10-CM | POA: Diagnosis present

## 2023-05-17 DIAGNOSIS — F988 Other specified behavioral and emotional disorders with onset usually occurring in childhood and adolescence: Secondary | ICD-10-CM | POA: Diagnosis present

## 2023-05-17 DIAGNOSIS — I2510 Atherosclerotic heart disease of native coronary artery without angina pectoris: Secondary | ICD-10-CM | POA: Diagnosis not present

## 2023-05-17 DIAGNOSIS — Z6841 Body Mass Index (BMI) 40.0 and over, adult: Secondary | ICD-10-CM

## 2023-05-17 DIAGNOSIS — Z7951 Long term (current) use of inhaled steroids: Secondary | ICD-10-CM

## 2023-05-17 DIAGNOSIS — Z7985 Long-term (current) use of injectable non-insulin antidiabetic drugs: Secondary | ICD-10-CM | POA: Diagnosis not present

## 2023-05-17 DIAGNOSIS — I50813 Acute on chronic right heart failure: Secondary | ICD-10-CM | POA: Diagnosis not present

## 2023-05-17 DIAGNOSIS — I4891 Unspecified atrial fibrillation: Secondary | ICD-10-CM | POA: Diagnosis not present

## 2023-05-17 DIAGNOSIS — G8929 Other chronic pain: Secondary | ICD-10-CM | POA: Diagnosis present

## 2023-05-17 DIAGNOSIS — N19 Unspecified kidney failure: Secondary | ICD-10-CM | POA: Diagnosis present

## 2023-05-17 DIAGNOSIS — Z7984 Long term (current) use of oral hypoglycemic drugs: Secondary | ICD-10-CM

## 2023-05-17 DIAGNOSIS — I4892 Unspecified atrial flutter: Principal | ICD-10-CM | POA: Diagnosis present

## 2023-05-17 DIAGNOSIS — Z87891 Personal history of nicotine dependence: Secondary | ICD-10-CM | POA: Diagnosis not present

## 2023-05-17 DIAGNOSIS — Z79899 Other long term (current) drug therapy: Secondary | ICD-10-CM

## 2023-05-17 DIAGNOSIS — Z951 Presence of aortocoronary bypass graft: Secondary | ICD-10-CM

## 2023-05-17 DIAGNOSIS — E119 Type 2 diabetes mellitus without complications: Secondary | ICD-10-CM | POA: Diagnosis present

## 2023-05-17 DIAGNOSIS — G4733 Obstructive sleep apnea (adult) (pediatric): Secondary | ICD-10-CM | POA: Diagnosis present

## 2023-05-17 DIAGNOSIS — I252 Old myocardial infarction: Secondary | ICD-10-CM | POA: Diagnosis not present

## 2023-05-17 DIAGNOSIS — K219 Gastro-esophageal reflux disease without esophagitis: Secondary | ICD-10-CM | POA: Diagnosis present

## 2023-05-17 DIAGNOSIS — I110 Hypertensive heart disease with heart failure: Secondary | ICD-10-CM | POA: Diagnosis present

## 2023-05-17 DIAGNOSIS — I10 Essential (primary) hypertension: Secondary | ICD-10-CM | POA: Diagnosis not present

## 2023-05-17 DIAGNOSIS — I480 Paroxysmal atrial fibrillation: Secondary | ICD-10-CM | POA: Diagnosis present

## 2023-05-17 DIAGNOSIS — E871 Hypo-osmolality and hyponatremia: Secondary | ICD-10-CM | POA: Diagnosis present

## 2023-05-17 DIAGNOSIS — I878 Other specified disorders of veins: Secondary | ICD-10-CM | POA: Diagnosis present

## 2023-05-17 DIAGNOSIS — I484 Atypical atrial flutter: Secondary | ICD-10-CM | POA: Diagnosis not present

## 2023-05-17 DIAGNOSIS — I5031 Acute diastolic (congestive) heart failure: Secondary | ICD-10-CM | POA: Diagnosis present

## 2023-05-17 DIAGNOSIS — E782 Mixed hyperlipidemia: Secondary | ICD-10-CM | POA: Diagnosis not present

## 2023-05-17 DIAGNOSIS — J309 Allergic rhinitis, unspecified: Secondary | ICD-10-CM | POA: Diagnosis present

## 2023-05-17 DIAGNOSIS — I872 Venous insufficiency (chronic) (peripheral): Secondary | ICD-10-CM | POA: Diagnosis present

## 2023-05-17 DIAGNOSIS — J301 Allergic rhinitis due to pollen: Secondary | ICD-10-CM | POA: Diagnosis not present

## 2023-05-17 DIAGNOSIS — Z8249 Family history of ischemic heart disease and other diseases of the circulatory system: Secondary | ICD-10-CM

## 2023-05-17 LAB — BASIC METABOLIC PANEL WITH GFR
Anion gap: 9 (ref 5–15)
BUN: 56 mg/dL — ABNORMAL HIGH (ref 8–23)
CO2: 26 mmol/L (ref 22–32)
Calcium: 9.1 mg/dL (ref 8.9–10.3)
Chloride: 97 mmol/L — ABNORMAL LOW (ref 98–111)
Creatinine, Ser: 1.11 mg/dL (ref 0.61–1.24)
GFR, Estimated: >60 mL/min
Glucose, Bld: 141 mg/dL — ABNORMAL HIGH (ref 70–99)
Potassium: 4.8 mmol/L (ref 3.5–5.1)
Sodium: 132 mmol/L — ABNORMAL LOW (ref 135–145)

## 2023-05-17 LAB — CBC
HCT: 42.3 % (ref 39.0–52.0)
Hemoglobin: 14.6 g/dL (ref 13.0–17.0)
MCH: 31.1 pg (ref 26.0–34.0)
MCHC: 34.5 g/dL (ref 30.0–36.0)
MCV: 90.2 fL (ref 80.0–100.0)
Platelets: 185 10*3/uL (ref 150–400)
RBC: 4.69 MIL/uL (ref 4.22–5.81)
RDW: 13.9 % (ref 11.5–15.5)
WBC: 12.7 10*3/uL — ABNORMAL HIGH (ref 4.0–10.5)
nRBC: 0 % (ref 0.0–0.2)

## 2023-05-17 LAB — TROPONIN I (HIGH SENSITIVITY)
Troponin I (High Sensitivity): 17 ng/L (ref <18)
Troponin I (High Sensitivity): 18 ng/L — ABNORMAL HIGH (ref <18)

## 2023-05-17 LAB — BRAIN NATRIURETIC PEPTIDE: B Natriuretic Peptide: 500.6 pg/mL — ABNORMAL HIGH (ref 0.0–100.0)

## 2023-05-17 LAB — MAGNESIUM: Magnesium: 2.2 mg/dL (ref 1.7–2.4)

## 2023-05-17 MED ORDER — HEPARIN BOLUS VIA INFUSION
4000.0000 [IU] | Freq: Once | INTRAVENOUS | Status: AC
  Administered 2023-05-17: 4000 [IU] via INTRAVENOUS

## 2023-05-17 MED ORDER — HEPARIN (PORCINE) 25000 UT/250ML-% IV SOLN
1800.0000 [IU]/h | INTRAVENOUS | Status: AC
  Administered 2023-05-17: 1600 [IU]/h via INTRAVENOUS
  Administered 2023-05-18: 1800 [IU]/h via INTRAVENOUS
  Filled 2023-05-17 (×2): qty 250

## 2023-05-17 MED ORDER — SODIUM CHLORIDE 0.9 % IV BOLUS
500.0000 mL | Freq: Once | INTRAVENOUS | Status: AC
  Administered 2023-05-17: 500 mL via INTRAVENOUS

## 2023-05-17 MED ORDER — DILTIAZEM HCL-DEXTROSE 125-5 MG/125ML-% IV SOLN (PREMIX)
INTRAVENOUS | Status: AC
  Filled 2023-05-17: qty 125

## 2023-05-17 MED ORDER — DILTIAZEM LOAD VIA INFUSION
10.0000 mg | Freq: Once | INTRAVENOUS | Status: AC
  Administered 2023-05-17: 10 mg via INTRAVENOUS
  Filled 2023-05-17: qty 10

## 2023-05-17 MED ORDER — DILTIAZEM HCL-DEXTROSE 125-5 MG/125ML-% IV SOLN (PREMIX)
5.0000 mg/h | INTRAVENOUS | Status: DC
  Administered 2023-05-17: 5 mg/h via INTRAVENOUS
  Administered 2023-05-18: 15 mg/h via INTRAVENOUS
  Administered 2023-05-18: 12.5 mg/h via INTRAVENOUS
  Filled 2023-05-17 (×2): qty 125

## 2023-05-17 MED ORDER — ALPRAZOLAM 0.5 MG PO TABS
0.2500 mg | ORAL_TABLET | Freq: Once | ORAL | Status: AC
  Administered 2023-05-17: 0.25 mg via ORAL
  Filled 2023-05-17: qty 1

## 2023-05-17 NOTE — Progress Notes (Signed)
ANTICOAGULATION CONSULT NOTE - Initial Consult  Pharmacy Consult for heparin Indication: atrial fibrillation  Allergies  Allergen Reactions   Zolpidem     Other Reaction(s): Unknown    Patient Measurements: Height: 6' (182.9 cm) Weight: 136.1 kg (300 lb) IBW/kg (Calculated) : 77.6 Heparin Dosing Weight: 109 kg  Vital Signs: Temp: 98 F (36.7 C) (08/26 1633) BP: 126/104 (08/26 1900) Pulse Rate: 74 (08/26 1633)  Labs: Recent Labs    05/17/23 1731 05/17/23 1834  HGB 14.6  --   HCT 42.3  --   PLT 185  --   CREATININE 1.11  --   TROPONINIHS  --  17    Estimated Creatinine Clearance: 83.4 mL/min (by C-G formula based on SCr of 1.11 mg/dL).   Medical History: Past Medical History:  Diagnosis Date   ADD (attention deficit disorder)    Anxiety    Bilateral swelling of feet    Constipation    Coronary artery disease    a. s/p CABG in 03/2017 with LIMA-LAD, reverse SVG-intermediate, sequential reverse SVG-OM1-dLCx, and sequential reverse SVG-PDA-PLB   Diabetes mellitus without complication (HCC)    type 2   Dietary indiscretion    Dyspnea    GERD (gastroesophageal reflux disease)    History of heart attack    Hypertension    Obesity    Obstructive sleep apnea    PAF (paroxysmal atrial fibrillation) (HCC)    a. s/p DCCV in 04/2017   Right bundle branch block    Sleep apnea    SOB (shortness of breath)     Assessment: 72 yo M with afib and possible flutter at [facility]. Med rec pending but no history of current anticoagulation in chart or fill history prior to admission. Patient was on rivaroxaban ~2018. Pharmacy consulted for heparin.    Planning transfer to [facility] or [facility].   Goal of Therapy:  Heparin level 0.3-0.7 units/ml Monitor platelets by anticoagulation protocol: Yes   Plan:  Heparin 4000 units x1 then 1600 units/hr Monitor daily heparin level, CBC, signs/symptoms of bleeding   Alphia Moh, PharmD, BCPS, BCCP Clinical Pharmacist  Please check AMION for all  [facility] Pharmacy phone numbers After 10:00 PM, call Main Pharmacy [phone]

## 2023-05-17 NOTE — ED Triage Notes (Signed)
Patient presents to ED via POV from home. Reports his watch has been alerting him that he is in a fib. Currently on metoprolol. Denies symptoms.

## 2023-05-17 NOTE — ED Provider Notes (Signed)
[place] [facility] Provider Note   CSN: [id number] Arrival date & time: 05/17/23  1624     History  Chief Complaint  Patient presents with   MSE    David Frost is a 72 y.o. male.  HPI Patient presents with possible A-fib.  States his watch alerted him.  States he does not actually feel it but is felt short of breath for the last 2 to 3 days.  Feels a little off.  Not on blood thinners.  Does have a history of A-fib.  Is on metoprolol reportedly.  No chest pain.   Past Medical History:  Diagnosis Date   ADD (attention deficit disorder)    Anxiety    Bilateral swelling of feet    Constipation    Coronary artery disease    a. s/p CABG in 03/2017 with LIMA-LAD, reverse SVG-intermediate, sequential reverse SVG-OM1-dLCx, and sequential reverse SVG-PDA-PLB   Diabetes mellitus without complication (HCC)    type 2   Dietary indiscretion    Dyspnea    GERD (gastroesophageal reflux disease)    History of heart attack    Hypertension    Obesity    Obstructive sleep apnea    PAF (paroxysmal atrial fibrillation) (HCC)    a. s/p DCCV in 04/2017   Right bundle branch block    Sleep apnea    SOB (shortness of breath)     Home Medications Prior to Admission medications   Medication Sig Start Date End Date Taking? Authorizing Provider  acetaminophen (TYLENOL) 325 MG tablet Take 2 tablets (650 mg total) by mouth every 6 (six) hours as needed for mild pain. 03/27/17   Barrett, Erin R, PA-C  ALPRAZolam (XANAX) 0.25 MG tablet Take 1 tablet (0.25 mg total) by mouth 2 (two) times daily as needed for anxiety. 03/27/17   Barrett, Rae Roam, PA-C  aspirin EC 81 MG tablet Take 1 tablet (81 mg total) by mouth daily. 09/23/17   Barrett, Joline Salt, PA-C  atorvastatin (LIPITOR) 40 MG tablet TAKE 1 TABLET(40 MG) BY MOUTH DAILY 11/23/22   Runell Gess, MD  bimatoprost (LUMIGAN) 0.01 % SOLN Lumigan 0.01 % eye drops  INSTILL 1 DROP INTO LEFT EYE EVERY NIGHT AT  BEDTIME    [provider]  icosapent Ethyl (VASCEPA) 1 g capsule TAKE 2 CAPSULES(2 GRAMS) BY MOUTH TWICE DAILY 11/25/22   Runell Gess, MD  loratadine (CLARITIN) 10 MG tablet Take 10 mg by mouth daily.    [provider]  metoprolol succinate (TOPROL-XL) 50 MG 24 hr tablet Take 1 tablet (50 mg total) by mouth daily. 09/06/19   Runell Gess, MD  Multiple Vitamins-Minerals (PRESERVISION AREDS 2 PO) Take 1 capsule by mouth 2 (two) times daily.    [provider]  nitroGLYCERIN (NITROSTAT) 0.4 MG SL tablet PLACE 1 TABLET UNDER THE TONGUE EVERY 5 MINUTES AS NEEDED FOR CHEST PAIN 02/26/20   Runell Gess, MD  olmesartan-hydrochlorothiazide (BENICAR HCT) 40-12.5 MG tablet Take 1 tablet by mouth every morning. 02/15/20   [provider]  PROAIR RESPICLICK 108 (90 Base) MCG/ACT AEPB Take 2 puffs by mouth as directed. 02/24/17   [provider]  Semaglutide,0.25 or 0.5MG /DOS, (OZEMPIC, 0.25 OR 0.5 MG/DOSE,) 2 MG/3ML SOPN INJECT 0.5 MG UNDER THE SKIN ONCE A WEEK 11/23/22   Rayburn, [name], PA-C  Vitamin D, Ergocalciferol, (DRISDOL) 1.25 MG (50000 UNIT) CAPS capsule Take 1 capsule (50,000 Units total) by mouth every 7 (seven)  days. 09/10/22   Langston Reusing, MD      Allergies    Zolpidem    Review of Systems   Review of Systems  Physical Exam Updated Vital Signs BP (!) 126/104   Pulse 74   Temp 98 F (36.7 C)   Resp 20   Ht 6' (1.829 m)   Wt 136.1 kg   SpO2 95%   BMI 40.69 kg/m  Physical Exam Vitals and nursing note reviewed.  Eyes:     Pupils: Pupils are equal, round, and reactive to light.  Cardiovascular:     Rate and Rhythm: Regular rhythm. Tachycardia present.  Abdominal:     Tenderness: There is no abdominal tenderness.  Musculoskeletal:        General: No tenderness.  Skin:    General: Skin is warm.     Capillary Refill: Capillary refill takes less than 2 seconds.  Neurological:     Mental Status: He is alert  and oriented to person, place, and time.     ED Results / Procedures / Treatments   Labs (all labs ordered are listed, but only abnormal results are displayed) Labs Reviewed  BASIC METABOLIC PANEL - Abnormal; Notable for the following components:      Result Value   Sodium 132 (*)    Chloride 97 (*)    Glucose, Bld 141 (*)    BUN 56 (*)    All other components within normal limits  BRAIN NATRIURETIC PEPTIDE - Abnormal; Notable for the following components:   B Natriuretic Peptide 500.6 (*)    All other components within normal limits  CBC - Abnormal; Notable for the following components:   WBC 12.7 (*)    All other components within normal limits  MAGNESIUM  TSH  TROPONIN I (HIGH SENSITIVITY)    EKG EKG Interpretation Date/Time:  Monday May 17 2023 16:36:36 EDT Ventricular Rate:  128 PR Interval:    QRS Duration:  159 QT Interval:  339 QTC Calculation: 495 R Axis:   256  Text Interpretation: Atrial flutter with predominant 2:1 AV block Right bundle branch block Inferior infarct, age indeterminate Probable anterior infarct, age indeterminate Confirmed by Benjiman Core [phone]) on 05/17/2023 4:46:19 PM  Radiology DG Chest Portable 1 View  Result Date: 05/17/2023 CLINICAL DATA:  Atrial fibrillation. EXAM: PORTABLE CHEST 1 VIEW COMPARISON:  May 27, 2022 FINDINGS: Multiple sternal wires and vascular clips are noted. The cardiac silhouette is mildly enlarged and unchanged in size. Both lungs are clear. The visualized skeletal structures are unremarkable. IMPRESSION: 1. Evidence of prior median sternotomy/CABG. 2. No active disease. Electronically Signed   By: Aram Candela M.D.   On: 05/17/2023 18:04    Procedures Procedures    Medications Ordered in ED Medications  diltiazem (CARDIZEM) 1 mg/mL load via infusion 10 mg (10 mg Intravenous Bolus from Bag 05/17/23 1733)    And  diltiazem (CARDIZEM) 125 mg in dextrose 5% 125 mL (1 mg/mL) infusion (10 mg/hr  Intravenous Rate/Dose Change 05/17/23 1818)  dilTIAZem HCl-Dextrose 125-5 MG/125ML-% infusion (  Not Given 05/17/23 1742)  sodium chloride 0.9 % bolus 500 mL (500 mLs Intravenous New Bag/Given 05/17/23 1938)    ED Course/ Medical Decision Making/ A&P                                 Medical Decision Making Amount and/or Complexity of Data Reviewed Labs: ordered. Radiology: ordered.  Risk  Prescription drug management.   Patient presents with shortness of breath and tachycardia.  Has had for likely at least 48 hours.  History of A-fib and appears to be in a flutter now.  EKG should does show a very brief pause that does show some likely flutter waves.  Patient is not really aware that he is in it and do not think we can say it is started within the last 48 hours.  Also not on blood thinners.  Not a candidate for ED cardioversion.  Will check basic blood work.  Will get x-ray.  Will start Cardizem.  I reviewed most recent cardiology note.  Chest x-ray read during.  Electrolytes overall reassuring.  However BUN is elevated.  This could go along with some dehydration.  Patient now states that he has been outside recently and was feeling hot.  Now admits that he was having some tightness in the chest.  Will now add some troponin.  BNP is elevated but x-ray does not show volume overload.  With the BUN elevated at rebound work is been doing outside we will add a small fluid bolus.  Still tachycardic on a Cardizem drip at this time.  Lab work overall reassuring.  Troponin is not elevated. Discussed with Dr Rudene Anda.  Can go to [facility] or [facility] but [facility].  Will start heparin drip.  CRITICAL CARE Performed by: Benjiman Core Total critical care time: 30 minutes Critical care time was exclusive of separately billable procedures and treating other patients. Critical care was necessary to treat or prevent imminent or life-threatening deterioration. Critical care  was time spent personally by me on the following activities: development of treatment plan with patient and/or surrogate as well as nursing, discussions with consultants, evaluation of patient's response to treatment, examination of patient, obtaining history from patient or surrogate, ordering and performing treatments and interventions, ordering and review of laboratory studies, ordering and review of radiographic studies, pulse oximetry and re-evaluation of patient's condition.   Chadsvasc 4          Final Clinical Impression(s) / ED Diagnoses Final diagnoses:  Atrial flutter with rapid ventricular response [facility])    Rx / DC Orders ED Discharge Orders     None         Benjiman Core, MD 05/17/23 1943

## 2023-05-17 NOTE — ED Notes (Signed)
[facility] called for transport @ 20:38  No ETA currently.Marland KitchenMarland KitchenMarland Kitchen

## 2023-05-17 NOTE — Progress Notes (Signed)
Plan of Care Note for accepted transfer   Patient: David Frost MRN: [id number]   DOA: 05/17/2023  Facility requesting transfer: [facility]   Requesting Provider: Dr. Rubin Payor   Reason for transfer: Rapid atrial flutter   Facility course: 72 year old gentleman with HTN, HLD, DM, CAD, OSA, and paroxysmal atrial fibrillation not anticoagulated who presents with 2 to 3 days of shortness of breath and is found to be in rapid atrial flutter.  EKG demonstrates atrial flutter with rate 128.  Chest x-ray is negative for acute findings.  BNP was elevated 501 and troponin was normal.  Cardiology was consulted with ED physician and the patient was treated with Xanax, 500 mL of saline, 10 mg IV diltiazem, and was started on IV diltiazem and IV heparin infusions.  Plan of care: The patient is accepted for admission to Progressive unit, at [facility].   Author: Briscoe Deutscher, MD 05/17/2023  Check [email or web address] for on-call coverage.  Nursing staff, Please call [facility] Admits & Consults System-Wide number on Amion as soon as patient's arrival, so appropriate admitting provider can evaluate the pt.

## 2023-05-18 ENCOUNTER — Observation Stay (HOSPITAL_COMMUNITY): Payer: Medicare Other

## 2023-05-18 ENCOUNTER — Encounter (HOSPITAL_COMMUNITY): Payer: Self-pay | Admitting: Family Medicine

## 2023-05-18 ENCOUNTER — Telehealth: Payer: Self-pay | Admitting: Cardiovascular Disease

## 2023-05-18 ENCOUNTER — Other Ambulatory Visit (HOSPITAL_COMMUNITY): Payer: Self-pay

## 2023-05-18 DIAGNOSIS — E785 Hyperlipidemia, unspecified: Secondary | ICD-10-CM | POA: Diagnosis present

## 2023-05-18 DIAGNOSIS — E782 Mixed hyperlipidemia: Secondary | ICD-10-CM | POA: Diagnosis not present

## 2023-05-18 DIAGNOSIS — Z87891 Personal history of nicotine dependence: Secondary | ICD-10-CM | POA: Diagnosis not present

## 2023-05-18 DIAGNOSIS — E119 Type 2 diabetes mellitus without complications: Secondary | ICD-10-CM

## 2023-05-18 DIAGNOSIS — Z6841 Body Mass Index (BMI) 40.0 and over, adult: Secondary | ICD-10-CM | POA: Diagnosis not present

## 2023-05-18 DIAGNOSIS — J309 Allergic rhinitis, unspecified: Secondary | ICD-10-CM | POA: Diagnosis present

## 2023-05-18 DIAGNOSIS — I50813 Acute on chronic right heart failure: Secondary | ICD-10-CM | POA: Diagnosis not present

## 2023-05-18 DIAGNOSIS — J301 Allergic rhinitis due to pollen: Secondary | ICD-10-CM | POA: Diagnosis not present

## 2023-05-18 DIAGNOSIS — Z7982 Long term (current) use of aspirin: Secondary | ICD-10-CM | POA: Diagnosis not present

## 2023-05-18 DIAGNOSIS — N19 Unspecified kidney failure: Secondary | ICD-10-CM | POA: Diagnosis not present

## 2023-05-18 DIAGNOSIS — I2510 Atherosclerotic heart disease of native coronary artery without angina pectoris: Secondary | ICD-10-CM | POA: Diagnosis present

## 2023-05-18 DIAGNOSIS — I5031 Acute diastolic (congestive) heart failure: Secondary | ICD-10-CM | POA: Diagnosis present

## 2023-05-18 DIAGNOSIS — I110 Hypertensive heart disease with heart failure: Secondary | ICD-10-CM | POA: Diagnosis present

## 2023-05-18 DIAGNOSIS — I484 Atypical atrial flutter: Secondary | ICD-10-CM | POA: Diagnosis not present

## 2023-05-18 DIAGNOSIS — F988 Other specified behavioral and emotional disorders with onset usually occurring in childhood and adolescence: Secondary | ICD-10-CM | POA: Diagnosis present

## 2023-05-18 DIAGNOSIS — Z79899 Other long term (current) drug therapy: Secondary | ICD-10-CM | POA: Diagnosis not present

## 2023-05-18 DIAGNOSIS — I4892 Unspecified atrial flutter: Secondary | ICD-10-CM | POA: Diagnosis present

## 2023-05-18 DIAGNOSIS — I10 Essential (primary) hypertension: Secondary | ICD-10-CM

## 2023-05-18 DIAGNOSIS — R6510 Systemic inflammatory response syndrome (SIRS) of non-infectious origin without acute organ dysfunction: Secondary | ICD-10-CM | POA: Diagnosis present

## 2023-05-18 DIAGNOSIS — Z7985 Long-term (current) use of injectable non-insulin antidiabetic drugs: Secondary | ICD-10-CM | POA: Diagnosis not present

## 2023-05-18 DIAGNOSIS — E871 Hypo-osmolality and hyponatremia: Secondary | ICD-10-CM | POA: Diagnosis present

## 2023-05-18 DIAGNOSIS — I480 Paroxysmal atrial fibrillation: Secondary | ICD-10-CM | POA: Diagnosis present

## 2023-05-18 DIAGNOSIS — G4733 Obstructive sleep apnea (adult) (pediatric): Secondary | ICD-10-CM | POA: Diagnosis present

## 2023-05-18 DIAGNOSIS — Z951 Presence of aortocoronary bypass graft: Secondary | ICD-10-CM | POA: Diagnosis not present

## 2023-05-18 DIAGNOSIS — I252 Old myocardial infarction: Secondary | ICD-10-CM | POA: Diagnosis not present

## 2023-05-18 DIAGNOSIS — F411 Generalized anxiety disorder: Secondary | ICD-10-CM | POA: Diagnosis present

## 2023-05-18 DIAGNOSIS — Z7951 Long term (current) use of inhaled steroids: Secondary | ICD-10-CM | POA: Diagnosis not present

## 2023-05-18 DIAGNOSIS — I4891 Unspecified atrial fibrillation: Secondary | ICD-10-CM | POA: Diagnosis not present

## 2023-05-18 DIAGNOSIS — G8929 Other chronic pain: Secondary | ICD-10-CM | POA: Diagnosis present

## 2023-05-18 DIAGNOSIS — K219 Gastro-esophageal reflux disease without esophagitis: Secondary | ICD-10-CM | POA: Diagnosis present

## 2023-05-18 DIAGNOSIS — Z7984 Long term (current) use of oral hypoglycemic drugs: Secondary | ICD-10-CM | POA: Diagnosis not present

## 2023-05-18 LAB — ECHOCARDIOGRAM COMPLETE
AR max vel: 2.77 cm2
AV Area VTI: 2.81 cm2
AV Area mean vel: 2.64 cm2
AV Mean grad: 11 mmHg
AV Peak grad: 16.4 mmHg
Ao pk vel: 2.03 m/s
Height: 72 in
Weight: 5216 oz

## 2023-05-18 LAB — CBC
HCT: 43 % (ref 39.0–52.0)
Hemoglobin: 14.5 g/dL (ref 13.0–17.0)
MCH: 30.7 pg (ref 26.0–34.0)
MCHC: 33.7 g/dL (ref 30.0–36.0)
MCV: 90.9 fL (ref 80.0–100.0)
Platelets: 186 10*3/uL (ref 150–400)
RBC: 4.73 MIL/uL (ref 4.22–5.81)
RDW: 14 % (ref 11.5–15.5)
WBC: 14 10*3/uL — ABNORMAL HIGH (ref 4.0–10.5)
nRBC: 0 % (ref 0.0–0.2)

## 2023-05-18 LAB — URINALYSIS, COMPLETE (UACMP) WITH MICROSCOPIC
Bacteria, UA: NONE SEEN
Bilirubin Urine: NEGATIVE
Glucose, UA: >=500 mg/dL — AB
Ketones, ur: NEGATIVE mg/dL
Leukocytes,Ua: NEGATIVE
Nitrite: NEGATIVE
Protein, ur: NEGATIVE mg/dL
Specific Gravity, Urine: 1.016 (ref 1.005–1.030)
pH: 5 (ref 5.0–8.0)

## 2023-05-18 LAB — COMPREHENSIVE METABOLIC PANEL
ALT: 48 U/L — ABNORMAL HIGH (ref 0–44)
AST: 18 U/L (ref 15–41)
Albumin: 3.3 g/dL — ABNORMAL LOW (ref 3.5–5.0)
Alkaline Phosphatase: 56 U/L (ref 38–126)
Anion gap: 10 (ref 5–15)
BUN: 43 mg/dL — ABNORMAL HIGH (ref 8–23)
CO2: 24 mmol/L (ref 22–32)
Calcium: 8.9 mg/dL (ref 8.9–10.3)
Chloride: 100 mmol/L (ref 98–111)
Creatinine, Ser: 1.21 mg/dL (ref 0.61–1.24)
GFR, Estimated: >60 mL/min (ref >60)
Glucose, Bld: 151 mg/dL — ABNORMAL HIGH (ref 70–99)
Potassium: 3.9 mmol/L (ref 3.5–5.1)
Sodium: 134 mmol/L — ABNORMAL LOW (ref 135–145)
Total Bilirubin: 0.7 mg/dL (ref 0.3–1.2)
Total Protein: 6.3 g/dL — ABNORMAL LOW (ref 6.5–8.1)

## 2023-05-18 LAB — GLUCOSE, CAPILLARY
Glucose-Capillary: 168 mg/dL — ABNORMAL HIGH (ref 70–99)
Glucose-Capillary: 177 mg/dL — ABNORMAL HIGH (ref 70–99)
Glucose-Capillary: 167 mg/dL — ABNORMAL HIGH (ref 70–99)
Glucose-Capillary: 169 mg/dL — ABNORMAL HIGH (ref 70–99)
Glucose-Capillary: 183 mg/dL — ABNORMAL HIGH (ref 70–99)

## 2023-05-18 LAB — RAPID URINE DRUG SCREEN, HOSP PERFORMED
Amphetamines: NOT DETECTED
Barbiturates: NOT DETECTED
Benzodiazepines: NOT DETECTED
Cocaine: NOT DETECTED
Opiates: NOT DETECTED
Tetrahydrocannabinol: NOT DETECTED

## 2023-05-18 LAB — URIC ACID: Uric Acid, Serum: 6 mg/dL (ref 3.7–8.6)

## 2023-05-18 LAB — ETHANOL: Alcohol, Ethyl (B): <10 mg/dL (ref <10)

## 2023-05-18 LAB — PROCALCITONIN: Procalcitonin: <0.1 ng/mL

## 2023-05-18 LAB — OSMOLALITY: Osmolality: 306 mOsm/kg — ABNORMAL HIGH (ref 275–295)

## 2023-05-18 LAB — TSH: TSH: 2.212 u[IU]/mL (ref 0.350–4.500)

## 2023-05-18 LAB — OSMOLALITY, URINE: Osmolality, Ur: 622 mOsm/kg (ref 300–900)

## 2023-05-18 LAB — CREATININE, URINE, RANDOM: Creatinine, Urine: 48 mg/dL

## 2023-05-18 LAB — MAGNESIUM: Magnesium: 2.1 mg/dL (ref 1.7–2.4)

## 2023-05-18 LAB — HEPARIN LEVEL (UNFRACTIONATED): Heparin Unfractionated: 0.26 [IU]/mL — ABNORMAL LOW (ref 0.30–0.70)

## 2023-05-18 LAB — SODIUM, URINE, RANDOM: Sodium, Ur: 62 mmol/L

## 2023-05-18 MED ORDER — ALPRAZOLAM 0.25 MG PO TABS
0.2500 mg | ORAL_TABLET | Freq: Two times a day (BID) | ORAL | Status: DC | PRN
  Administered 2023-05-18 – 2023-05-19 (×3): 0.25 mg via ORAL
  Filled 2023-05-18 (×3): qty 1

## 2023-05-18 MED ORDER — DILTIAZEM HCL ER COATED BEADS 180 MG PO CP24
360.0000 mg | ORAL_CAPSULE | Freq: Every day | ORAL | Status: DC
  Administered 2023-05-18 – 2023-05-20 (×3): 360 mg via ORAL
  Filled 2023-05-18 (×3): qty 2

## 2023-05-18 MED ORDER — ACETAMINOPHEN 325 MG PO TABS
650.0000 mg | ORAL_TABLET | Freq: Four times a day (QID) | ORAL | Status: DC | PRN
  Administered 2023-05-20: 650 mg via ORAL
  Filled 2023-05-18 (×2): qty 2

## 2023-05-18 MED ORDER — MELATONIN 3 MG PO TABS
3.0000 mg | ORAL_TABLET | Freq: Every evening | ORAL | Status: DC | PRN
  Administered 2023-05-18: 3 mg via ORAL
  Filled 2023-05-18: qty 1

## 2023-05-18 MED ORDER — ALPRAZOLAM 0.5 MG PO TABS
0.5000 mg | ORAL_TABLET | Freq: Once | ORAL | Status: AC
  Administered 2023-05-18: 0.5 mg via ORAL
  Filled 2023-05-18: qty 1

## 2023-05-18 MED ORDER — INSULIN ASPART 100 UNIT/ML IJ SOLN
0.0000 [IU] | Freq: Three times a day (TID) | INTRAMUSCULAR | Status: DC
  Administered 2023-05-18 – 2023-05-19 (×4): 2 [IU] via SUBCUTANEOUS
  Administered 2023-05-19: 3 [IU] via SUBCUTANEOUS
  Administered 2023-05-20: 2 [IU] via SUBCUTANEOUS
  Administered 2023-05-20: 1 [IU] via SUBCUTANEOUS

## 2023-05-18 MED ORDER — POLYETHYLENE GLYCOL 3350 17 G PO PACK
17.0000 g | PACK | Freq: Every day | ORAL | Status: DC
  Administered 2023-05-18 – 2023-05-20 (×3): 17 g via ORAL
  Filled 2023-05-18 (×3): qty 1

## 2023-05-18 MED ORDER — ATORVASTATIN CALCIUM 40 MG PO TABS
40.0000 mg | ORAL_TABLET | Freq: Every day | ORAL | Status: DC
  Administered 2023-05-18 – 2023-05-19 (×2): 40 mg via ORAL
  Filled 2023-05-18 (×3): qty 1

## 2023-05-18 MED ORDER — ONDANSETRON HCL 4 MG/2ML IJ SOLN: 4.0000 mg | Freq: Four times a day (QID) | INTRAMUSCULAR | Status: DC | PRN

## 2023-05-18 MED ORDER — HEPARIN BOLUS VIA INFUSION
1500.0000 [IU] | Freq: Once | INTRAVENOUS | Status: AC
  Administered 2023-05-18: 1500 [IU] via INTRAVENOUS
  Filled 2023-05-18: qty 1500

## 2023-05-18 MED ORDER — IRBESARTAN 150 MG PO TABS
150.0000 mg | ORAL_TABLET | Freq: Every day | ORAL | Status: DC
  Administered 2023-05-19 – 2023-05-20 (×2): 150 mg via ORAL
  Filled 2023-05-18 (×2): qty 1

## 2023-05-18 MED ORDER — IRBESARTAN 300 MG PO TABS
300.0000 mg | ORAL_TABLET | Freq: Every day | ORAL | Status: DC
  Administered 2023-05-18: 300 mg via ORAL
  Filled 2023-05-18: qty 1

## 2023-05-18 MED ORDER — ACETAMINOPHEN 650 MG RE SUPP: 650.0000 mg | Freq: Four times a day (QID) | RECTAL | Status: DC | PRN

## 2023-05-18 MED ORDER — APIXABAN 5 MG PO TABS
5.0000 mg | ORAL_TABLET | Freq: Two times a day (BID) | ORAL | Status: DC
  Administered 2023-05-18 – 2023-05-20 (×5): 5 mg via ORAL
  Filled 2023-05-18 (×5): qty 1

## 2023-05-18 MED ORDER — SENNA 8.6 MG PO TABS
1.0000 | ORAL_TABLET | Freq: Two times a day (BID) | ORAL | Status: DC
  Administered 2023-05-18 – 2023-05-20 (×5): 8.6 mg via ORAL
  Filled 2023-05-18 (×5): qty 1

## 2023-05-18 NOTE — Progress Notes (Addendum)
PROGRESS NOTE  David Frost  ZOX:[id number] DOB: [birth year] DOA: 05/17/2023 PCP: Garlan Fillers, MD   Brief Narrative: Patient is a 20 male with history of diabetes type 2, hypertension, hyperlipidemia, coronary artery disease status post CABG, paroxysmal A-fib not on anticoagulation status post cardioversion in July 2018, sleep apnea on CPAP, GAD who initially presented with shortness of breath.  Found to be in rapid A flutter on presentation.  TSH normal.  EKG showed atrial flutter with 2 is to 1 AV block, right bundle branch block.  Patient was admitted for the management of A-fib with RVR.  Cardiology consulted.  Currently in Cardizem drip, heparin drip.  Assessment & Plan:  Principal Problem:   Atrial flutter with rapid ventricular response (HCC) Active Problems:   Essential hypertension   DM2 (diabetes mellitus, type 2) (HCC)   Hyperlipidemia   Acute hyponatremia   Acute prerenal azotemia   SIRS (systemic inflammatory response syndrome) (HCC)   GAD (generalized anxiety disorder)   Allergic rhinitis   A-fib with RVR: History of paroxysmal A-fib status post electrocardioversion in July 2018.  Presented with rapid A flutter.  TSH is normal.  Cardiology consulted.  On heparin drip, Cardizem drip.  Monitor on telemetry.  Echo ordered.  Remains on A-fib with RVR this morning.  Hyponatremia: Sodium of 132 on presentation.Home HCTZ on hold.  Patient looks volume overloaded, has bilateral lower extremity edema.  Monitor BMP.  Might need diuresis but will leave decision up to cardiology  Leukocytosis: WBC of 12.7 on presentation.  Likely reactive.  Continue to monitor  Diabetes type 2: Recent A1c of 6.6.  Takes Ozempic.  Continue sliding scale.  Monitor blood sugars  Hypertension: Takes olmesartan, HCTZ, metoprolol at home.  Currently on diltiazem drip.  HCTZ on hold, ARB on hold  Hyperlipidemia: Lipitor, Vascepa  GAD: Takes Xanax at home.  Allergic rhinitis:Takes Claritin at  home  Sleep apnea: On CPAP  Morbid obesity: BMI 44.1  Bilateral lower extremity edema/superficial ulcers: Ulcers are most likely precipitated due to persistent leg edema.  He might benefit with compression stockings/Ace wraps  Deconditioning/debility :We will request a PT evaluation        DVT prophylaxis:heparin iv     Code Status: Full Code  Family Communication: Discussed with wife at bedside at 8/27  Patient status:Obs  Patient is from :Home  Anticipated discharge WJ:XBJY  Estimated DC date:2-3 days   Consultants: cardiology  Procedures:None  Antimicrobials:  Anti-infectives (From admission, onward)    None       Subjective: Patient seen and examined at bedside today.  Hemodynamically stable.  Comfortable.  Sitting on the chair.  EKG monitor showed A-fib with RVR with heart rate ranging from 110-130.  Denies any chest pain or shortness of breath.  Objective: Vitals:   05/17/23 2352 05/18/23 0100 05/18/23 0400 05/18/23 0553  BP: (!) 139/108  114/82   Pulse: (!) 109  (!) 52   Resp: (!) 22 (!) 25 (!) 22   Temp:      TempSrc:      SpO2: 95%  94%   Weight:    (!) 147.9 kg  Height:        Intake/Output Summary (Last 24 hours) at 05/18/2023 0744 Last data filed at 05/18/2023 0558 Gross per 24 hour  Intake 1444.89 ml  Output 600 ml  Net 844.89 ml   Filed Weights   05/17/23 1637 05/18/23 0553  Weight: 136.1 kg (!) 147.9 kg    Examination:  General exam: Overall comfortable, not in distress, morbidly obese HEENT: PERRL Respiratory system:  no wheezes or crackles  Cardiovascular system: Irregular regular rhythm.  Gastrointestinal system: Abdomen is distended, soft and nontender. Central nervous system: Alert and oriented Extremities: Bilateral lower extremity pitting  edema, no clubbing ,no cyanosis Skin: Superficial ulcers on  bilateral lower extremities   Data Reviewed: I have personally reviewed following labs and imaging  studies  CBC: Recent Labs  Lab 05/17/23 1731  WBC 12.7*  HGB 14.6  HCT 42.3  MCV 90.2  PLT 185   Basic Metabolic Panel: Recent Labs  Lab 05/17/23 1731  NA 132*  K 4.8  CL 97*  CO2 26  GLUCOSE 141*  BUN 56*  CREATININE 1.11  CALCIUM 9.1  MG 2.2     No results found for this or any previous visit (from the past 240 hour(s)).   Radiology Studies: DG Chest Portable 1 View  Result Date: 05/17/2023 CLINICAL DATA:  Atrial fibrillation. EXAM: PORTABLE CHEST 1 VIEW COMPARISON:  May 27, 2022 FINDINGS: Multiple sternal wires and vascular clips are noted. The cardiac silhouette is mildly enlarged and unchanged in size. Both lungs are clear. The visualized skeletal structures are unremarkable. IMPRESSION: 1. Evidence of prior median sternotomy/CABG. 2. No active disease. Electronically Signed   By: Aram Candela M.D.   On: 05/17/2023 18:04    Scheduled Meds:  atorvastatin  40 mg Oral Daily   insulin aspart  0-9 Units Subcutaneous TID WC   irbesartan  300 mg Oral Daily   Continuous Infusions:  diltiazem (CARDIZEM) infusion 12.5 mg/hr (05/18/23 0400)   heparin 1,600 Units/hr (05/17/23 2031)     LOS: 0 days   Burnadette Pop, MD [facility] P8/27/2024, 7:44 AM

## 2023-05-18 NOTE — Telephone Encounter (Signed)
  Darel Hong called, she said, the doctor asked to call Dr. Allyson Sabal to ask him if he can see pt in the hospital today.

## 2023-05-18 NOTE — Progress Notes (Signed)
Mobility Specialist Progress Note:   05/18/23 1449  Mobility  Activity Ambulated with assistance in hallway  Level of Assistance Contact guard assist, steadying assist  Assistive Device Cane  Distance Ambulated (ft) 470 ft  Activity Response Tolerated well  Mobility Referral Yes  $Mobility charge 1 Mobility  Mobility Specialist Start Time (ACUTE ONLY) 1436  Mobility Specialist Stop Time (ACUTE ONLY) 1447  Mobility Specialist Time Calculation (min) (ACUTE ONLY) 11 min    Pre Mobility: 95 HR During Mobility: 115 HR Post Mobility:  107 HR  Pt received in chair, agreeable to mobility. C/o bilateral knee pain, requiring x1 seated rest break. Otherwise asymptomatic and VSS throughout. Pt left in chair with call bell, chair alarm on, and family present.  D'Vante Earlene Plater Mobility Specialist Please contact via Special educational needs teacher or Rehab office at [phone]

## 2023-05-18 NOTE — Consult Note (Addendum)
Cardiology Consultation   Patient ID: [name] MRN: [id number]; DOB: 1951-02-21  Admit date: 05/17/2023 Date of Consult: 05/18/2023  PCP:  Garlan Fillers, MD   [facility] Providers Cardiologist:  Nanetta Batty, MD   Patient Profile:   David Frost is a 72 y.o. 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 Renford Dills.  History of Present Illness:   David Frost suffered an NSTEMI in [place] 03/15/2017 and was transferred to [facility] where he underwent cardiac catheterization revealing severe multivessel disease.  He was transferred to [facility] and underwent CABG x 6 with Dr. Tyrone Sage 03/22/2017.  Soon after discharge he was readmitted with A-fib with RVR that was symptomatic.  He was placed on amiodarone and oral anticoagulation.  He underwent outpatient cardioversion 05/04/2017 with successful conversion to sinus rhythm.  Per Dr. Hazle Coca last note, he was seen for an additional episode of A-fib with RVR complicated by hypotension.  He converted without electrical cardioversion. He was last seen by Dr. Allyson Sabal 10/21/22 and was doing well.     He presented to [facility] stating his watch notified him he was in Afib. He was asymptotic. Initial EKG showed 2:1 Atrial flutter with VR 128 with RBBB. He late admitted to chest tightness and three days of SOB.    WBC 14 Hb 14.5 HS troponin 17 --> 18 Mg 2.2 K 3.9 sCr 1.21 (1.11) TSH 2.212 BNP 501  He was started on heparin gtt and cardizem gtt. Not a DCCV candidate as he was not on OAC PTA and with unknown start time. He was transferred and admitted to [facility] for further workup. Cardiology asked to evaluate.   Wife is bedside and  helps with history. He is actively trying to lose weight for knee replacement surgery. He can walk a  city block when wearing the correct shoes to help with his knee pain. On Saturday, he developed chest tightness prompting him to take baby ASA, this resolved. This may have been related to anxiety. On Sunday, his watch notified him he was in Afib with rapid rate causing additional anxiety. He denies dyspnea, orthopnea, and palpitations. He is unaware of his flutter and RVR. He does state that he was given an albuterol inhaler and an additional inhaler. Instead of using one once per day, he was using both several times per day. This started 2 weeks ago. He denies recent illness and states this was for congestion/phlegm.   He does have chronic lower extremity edema with intermittent weeping that he treats at home with compression stockings and TED hose.   Past Medical History:  Diagnosis Date   ADD (attention deficit disorder)    Anxiety    Bilateral swelling of feet    Constipation    Coronary artery disease    a. s/p CABG in 03/2017 with LIMA-LAD, reverse SVG-intermediate, sequential reverse SVG-OM1-dLCx, and sequential reverse SVG-PDA-PLB   Diabetes mellitus without complication (HCC)    type 2   Dietary indiscretion    Dyspnea    GERD (gastroesophageal reflux disease)    History of heart attack    Hypertension    Obesity    Obstructive sleep apnea    PAF (paroxysmal atrial fibrillation) (HCC)    a. s/p DCCV in 04/2017  Right bundle branch block    Sleep apnea    SOB (shortness of breath)     Past Surgical History:  Procedure Laterality Date   CARDIAC CATHETERIZATION     CARDIOVERSION N/A 05/04/2017   Procedure: CARDIOVERSION;  Surgeon: Chilton Si, MD;  Location: [facility] ENDOSCOPY;  Service: Cardiovascular;  Laterality: N/A;   CATARACT EXTRACTION  12/05/2019   CORONARY ARTERY BYPASS GRAFT N/A 03/22/2017   Procedure: CORONARY ARTERY BYPASS GRAFTING (CABG) x6, using the left internal mammary artery and the greater saphenous vein harvested endoscopically from the right thigh and  calf and the left thigh. -LIMA to LAD -SVG to RAMUS INTERMEDIATE -SEQ SVG to OM1 and DISTAL LEFT CIRCUMFLEX -SEQ SVG to PDA and PLVB;  Surgeon: Delight Ovens, MD;  Location: [facility] OR;  Service: Open Heart Surgery;  Laterality   DOPPLER ECHOCARDIOGRAPHY  2010   NM MYOVIEW LTD  2007   TEE WITHOUT CARDIOVERSION N/A 03/22/2017   Procedure: TRANSESOPHAGEAL ECHOCARDIOGRAM (TEE);  Surgeon: Delight Ovens, MD;  Location: [facility] OR;  Service: Open Heart Surgery;  Laterality: N/A;   TONSILLECTOMY       Home Medications:  Prior to Admission medications   Medication Sig Start Date End Date Taking? Authorizing Provider  acetaminophen (TYLENOL) 325 MG tablet Take 2 tablets (650 mg total) by mouth every 6 (six) hours as needed for mild pain. 03/27/17  Yes Barrett, Erin R, PA-C  albuterol (VENTOLIN HFA) 108 (90 Base) MCG/ACT inhaler Inhale 2 puffs into the lungs every 6 (six) hours as needed for wheezing or shortness of breath.   Yes [provider]  ALPRAZolam (XANAX) 0.25 MG tablet Take 1 tablet (0.25 mg total) by mouth 2 (two) times daily as needed for anxiety. 03/27/17  Yes Barrett, Rae Roam, PA-C  aspirin EC 81 MG tablet Take 1 tablet (81 mg total) by mouth daily. 09/23/17  Yes Barrett, Joline Salt, PA-C  atorvastatin (LIPITOR) 40 MG tablet TAKE 1 TABLET(40 MG) BY MOUTH DAILY 11/23/22  Yes Runell Gess, MD  bimatoprost (LUMIGAN) 0.01 % SOLN Lumigan 0.01 % eye drops  INSTILL 1 DROP INTO LEFT EYE EVERY NIGHT AT BEDTIME   Yes [provider]  Cholecalciferol 1.25 MG (50000 UT) capsule Take 50,000 Units by mouth once a week. 03/03/23  Yes [provider]  fluticasone (FLONASE) 50 MCG/ACT nasal spray Place 1 spray into both nostrils daily. 04/20/23  Yes [provider]  icosapent Ethyl (VASCEPA) 1 g capsule TAKE 2 CAPSULES(2 GRAMS) BY MOUTH TWICE DAILY 11/25/22  Yes Runell Gess, MD  JARDIANCE 25 MG TABS tablet Take 25 mg by mouth daily.   Yes [provider]  loratadine  (CLARITIN) 10 MG tablet Take 10 mg by mouth daily.   Yes [provider]  metFORMIN (GLUCOPHAGE-XR) 500 MG 24 hr tablet Take 500 mg by mouth every evening. With meal.   Yes [provider]  metoprolol succinate (TOPROL-XL) 100 MG 24 hr tablet Take 100 mg by mouth daily. 10/08/22  Yes [provider]  Multiple Vitamins-Minerals (PRESERVISION AREDS 2 PO) Take 1 capsule by mouth 2 (two) times daily.   Yes [provider]  mupirocin ointment (BACTROBAN) 2 % Apply 1 Application topically 3 (three) times daily. For 7 days.   Yes [provider]  nitroGLYCERIN (NITROSTAT) 0.4 MG SL tablet PLACE 1 TABLET UNDER THE TONGUE EVERY 5 MINUTES AS NEEDED FOR CHEST PAIN 02/26/20  Yes Runell Gess, MD  olmesartan-hydrochlorothiazide (BENICAR HCT) 40-12.5 MG tablet Take  1 tablet by mouth every morning. 02/15/20  Yes [provider]  SYMBICORT 160-4.5 MCG/ACT inhaler Inhale 2 puffs into the lungs in the morning and at bedtime. Not to be taken more frequently than twice daily. 01/25/23  Yes [provider]  tirzepatide Greggory Keen) 2.5 MG/0.5ML Pen Inject 2.5 mg into the skin once a week.   Yes [provider]  triamcinolone cream (KENALOG) 0.1 % Apply 1 Application topically 2 (two) times daily as needed (Redness).   Yes [provider]  Semaglutide,0.25 or 0.5MG /DOS, (OZEMPIC, 0.25 OR 0.5 MG/DOSE,) 2 MG/3ML SOPN INJECT 0.5 MG UNDER THE SKIN ONCE A WEEK Patient not taking: Reported on 05/18/2023 11/23/22   Rayburn, [name], PA-C    Inpatient Medications: Scheduled Meds:  atorvastatin  40 mg Oral Daily   insulin aspart  0-9 Units Subcutaneous TID WC   irbesartan  300 mg Oral Daily   polyethylene glycol  17 g Oral Daily   senna  1 tablet Oral BID   Continuous Infusions:  diltiazem (CARDIZEM) infusion 15 mg/hr (05/18/23 1245)   heparin 1,800 Units/hr (05/18/23 1100)   PRN Meds: acetaminophen **OR** acetaminophen, ALPRAZolam,  melatonin, ondansetron (ZOFRAN) IV  Allergies:    Allergies  Allergen Reactions   Zolpidem     Other Reaction(s): Unknown  Other Reaction(s): Dysphoria    Social History:   Social History   Socioeconomic History   Marital status: Divorced    Spouse name: Not on file   Number of children: Not on file   Years of education: Not on file   Highest education level: Not on file  Occupational History   Occupation: Retired Psychologist, sport and exercise, still work part time  Tobacco Use   Smoking status: Former    Types: Cigars    Quit date: 03/08/2017    Years since quitting: 6.1   Smokeless tobacco: Never  Vaping Use   Vaping status: Never Used  Substance and Sexual Activity   Alcohol use: No    Alcohol/week: 70.0 standard drinks of alcohol    Types: 70 Cans of beer per week    Comment: pt states not drinking since CABG   Drug use: No   Sexual activity: Not on file  Other Topics Concern   Not on file  Social History Narrative   Not on file   Social Determinants of Health   Financial Resource Strain: Not on file  Food Insecurity: No Food Insecurity (05/17/2023)   Hunger Vital Sign    Worried About Running Out of Food in the Last Year: Never true    Ran Out of Food in the Last Year: Never true  Transportation Needs: No Transportation Needs (05/17/2023)   PRAPARE - Administrator, Civil Service (Medical): No    Lack of Transportation (Non-Medical): No  Physical Activity: Not on file  Stress: Not on file  Social Connections: Not on file  Intimate Partner Violence: Not At Risk (05/17/2023)   Humiliation, Afraid, Rape, and Kick questionnaire    Fear of Current or Ex-Partner: No    Emotionally Abused: No    Physically Abused: No    Sexually Abused: No    Family History:    Family History  Problem Relation Age of Onset   Heart disease Father    Heart attack Father      ROS:  Please see the history of present illness.   All other ROS reviewed and negative.      Physical Exam/Data:   Vitals:  05/18/23 0400 05/18/23 0553 05/18/23 0751 05/18/23 1240  BP: 114/82  101/86 (!) 128/102  Pulse: (!) 52  (!) 131   Resp: (!) 22  14 20   Temp:   (!) 97.4 F (36.3 C) 97.6 F (36.4 C)  TempSrc:   Oral Oral  SpO2: 94%  94%   Weight:  (!) 147.9 kg    Height:        Intake/Output Summary (Last 24 hours) at 05/18/2023 1410 Last data filed at 05/18/2023 0800 Gross per 24 hour  Intake 1814.82 ml  Output 600 ml  Net 1214.82 ml      05/18/2023    5:53 AM 05/17/2023    4:37 PM 11/10/2022   11:00 AM  Last 3 Weights  Weight (lbs) 326 lb 300 lb 309 lb  Weight (kg) 147.873 kg 136.079 kg 140.161 kg     Body mass index is 44.21 kg/m.  General:  obese male in NAD HEENT: normal Neck: no JVD - exam difficult Cardiac:  irregular rhythm, regular rate Lungs:  clear to auscultation bilaterally, no wheezing, rhonchi or rales  Abd: soft, nontender, no hepatomegaly  Ext: no edema Musculoskeletal:  No deformities, BUE and BLE strength normal and equal Skin: warm and dry  Neuro:  CNs 2-12 intact, no focal abnormalities noted Psych:  Normal affect   EKG:  The EKG was personally reviewed and demonstrates:  atrial flutter with 2:1 conduction with RBBB HR 127 Telemetry:  Telemetry was personally reviewed and demonstrates:  atrial flutter now rate controlled in the 70-90s  Relevant CV Studies:  Echo 05/18/23: 1. Left ventricular ejection fraction, by estimation, is 65 to 70%. The  left ventricle has normal function. Left ventricular endocardial border  not optimally defined to evaluate regional wall motion. Left ventricular  diastolic function could not be  evaluated.   2. Right ventricular systolic function was not well visualized. The right  ventricular size is not well visualized.   3. Left atrial size was mildly dilated.   4. Right atrial size was mildly dilated.   5. The mitral valve is abnormal. No evidence of mitral valve  regurgitation. No evidence of  mitral stenosis.   6. The aortic valve was not well visualized. Aortic valve regurgitation  is not visualized.   7. Aortic dilatation noted. There is mild dilatation of the ascending  aorta, measuring 44 mm.   8. The inferior vena cava is dilated in size with >50% respiratory  variability, suggesting right atrial pressure of 8 mmHg.   Laboratory Data:  High Sensitivity Troponin:   Recent Labs  Lab 05/17/23 1834 05/17/23 2023  TROPONINIHS 17 18*     Chemistry Recent Labs  Lab 05/17/23 1731 05/18/23 0653  NA 132* 134*  K 4.8 3.9  CL 97* 100  CO2 26 24  GLUCOSE 141* 151*  BUN 56* 43*  CREATININE 1.11 1.21  CALCIUM 9.1 8.9  MG 2.2 2.1  GFRNONAA >60 >60  ANIONGAP 9 10    Recent Labs  Lab 05/18/23 0653  PROT 6.3*  ALBUMIN 3.3*  AST 18  ALT 48*  ALKPHOS 56  BILITOT 0.7   Lipids No results for input(s): "CHOL", "TRIG", "HDL", "LABVLDL", "LDLCALC", "CHOLHDL" in the last 168 hours.  Hematology Recent Labs  Lab 05/17/23 1731 05/18/23 0653  WBC 12.7* 14.0*  RBC 4.69 4.73  HGB 14.6 14.5  HCT 42.3 43.0  MCV 90.2 90.9  MCH 31.1 30.7  MCHC 34.5 33.7  RDW 13.9 14.0  PLT 185 186  Thyroid  Recent Labs  Lab 05/17/23 1731  TSH 2.212    BNP Recent Labs  Lab 05/17/23 1731  BNP 500.6*    DDimer No results for input(s): "DDIMER" in the last 168 hours.   Radiology/Studies:  ECHOCARDIOGRAM COMPLETE  Result Date: 05/18/2023    ECHOCARDIOGRAM REPORT   Patient Name:   David Frost Date of Exam: 05/18/2023 Medical Rec #:  [id number]       Height:       72.0 in Accession #:    [id number]      Weight:       326.0 lb Date of Birth:  1951-05-14       BSA:          2.622 m Patient Age:    72 years        BP:           101/86 mmHg Patient Gender: M               HR:           95 bpm. Exam Location:  Inpatient Procedure: 2D Echo, Cardiac Doppler and Color Doppler Indications:    Atrial Flutter I48.92  History:        Patient has prior history of Echocardiogram examinations,  most                 recent 03/22/2017. Previous Myocardial Infarction, Prior CABG,                 Arrythmias:Atrial Flutter, Atrial Fibrillation and RBBB; Risk                 Factors:Dyslipidemia, Hypertension and Former Smoker.  Sonographer:    Dondra Prader RVT RCS Referring Phys: [id number] Angie Fava  Sonographer Comments: Technically difficult study due to poor echo windows, suboptimal subcostal window, suboptimal apical window, suboptimal parasternal window and patient is obese. Image acquisition challenging due to patient body habitus and Unable to  lie still for exam. Unable to get accurate measurements due to elevated HR and A-fib. 134 BPM IMPRESSIONS  1. Left ventricular ejection fraction, by estimation, is 65 to 70%. The left ventricle has normal function. Left ventricular endocardial border not optimally defined to evaluate regional wall motion. Left ventricular diastolic function could not be evaluated.  2. Right ventricular systolic function was not well visualized. The right ventricular size is not well visualized.  3. Left atrial size was mildly dilated.  4. Right atrial size was mildly dilated.  5. The mitral valve is abnormal. No evidence of mitral valve regurgitation. No evidence of mitral stenosis.  6. The aortic valve was not well visualized. Aortic valve regurgitation is not visualized.  7. Aortic dilatation noted. 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  8. The inferior vena cava is dilated in size with >50% respiratory variability, suggesting right atrial pressure of 8 mmHg. Comparison(s): Similar to 2018 study. FINDINGS  Left Ventricle: Left ventricular ejection fraction, by estimation, is 65 to 70%. The left ventricle has normal function. Left ventricular endocardial border not optimally defined to evaluate regional wall motion. The left ventricular internal cavity size was small. Suboptimal image quality limits for assessment of left ventricular hypertrophy.  Left ventricular diastolic function could not be evaluated due to atrial fibrillation. Left ventricular diastolic function could not be evaluated. Right Ventricle: The right ventricular size is not well visualized. No increase in right ventricular wall thickness. Right ventricular systolic function was not  well visualized. Left Atrium: Left atrial size was mildly dilated. Right Atrium: Right atrial size was mildly dilated. Pericardium: Trivial pericardial effusion is present. The pericardial effusion is surrounding the apex. Mitral Valve: The mitral valve is abnormal. No evidence of mitral valve regurgitation. No evidence of mitral valve stenosis. Tricuspid Valve: The tricuspid valve is not well visualized. Tricuspid valve regurgitation is not demonstrated. No evidence of tricuspid stenosis. Aortic Valve: The aortic valve was not well visualized. Aortic valve regurgitation is not visualized. Aortic valve mean gradient measures 11.0 mmHg. Aortic valve peak gradient measures 16.4 mmHg. Aortic valve area, by VTI measures 2.81 cm. Pulmonic Valve: The pulmonic valve was not well visualized. Pulmonic valve regurgitation is not visualized. No evidence of pulmonic stenosis. Aorta: Aortic dilatation noted. 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 Venous: The inferior vena cava is dilated in size with greater than 50% respiratory variability, suggesting right atrial pressure of 8 mmHg. IAS/Shunts: No atrial level shunt detected by color flow Doppler.  LEFT VENTRICLE PLAX 2D LVOT diam:     2.30 cm LV SV:         73 LV SV Index:   28 LVOT Area:     4.15 cm  IVC IVC diam: 2.40 cm AORTIC VALVE                     PULMONIC VALVE AV Area (Vmax):    2.77 cm      PV Vmax:       1.15 m/s AV Area (Vmean):   2.64 cm      PV Peak grad:  5.3 mmHg AV Area (VTI):     2.81 cm AV Vmax:           202.67 cm/s AV Vmean:          132.667 cm/s AV VTI:            0.258 m AV Peak Grad:      16.4 mmHg AV Mean Grad:       11.0 mmHg LVOT Vmax:         135.00 cm/s LVOT Vmean:        84.300 cm/s LVOT VTI:          0.175 m LVOT/AV VTI ratio: 0.68  AORTA Ao Root diam: 3.60 cm Ao Asc diam:  4.40 cm  SHUNTS Systemic VTI:  0.18 m Systemic Diam: 2.30 cm Riley Lam MD Electronically signed by Riley Lam MD Signature Date/Time: 05/18/2023/1:24:31 PM    Final    DG Chest Portable 1 View  Result Date: 05/17/2023 CLINICAL DATA:  Atrial fibrillation. EXAM: PORTABLE CHEST 1 VIEW COMPARISON:  May 27, 2022 FINDINGS: Multiple sternal wires and vascular clips are noted. The cardiac silhouette is mildly enlarged and unchanged in size. Both lungs are clear. The visualized skeletal structures are unremarkable. IMPRESSION: 1. Evidence of prior median sternotomy/CABG. 2. No active disease. Electronically Signed   By: Aram Candela M.D.   On: 05/17/2023 18:04     Assessment and Plan:   Atrial flutter with RVR - 2:1 flutter with RVR - now rate controlled on cardizem - may have been precipitated by overuse of albuterol - cadizem gtt running at 15 mg/hr - discussed options with him including TEE-DCCV this admission vs PO cardizem and eliquis and OP DCCV  - he prefers to avoid DCCV - on recent ambulation, HR remains in the 90s   Need for chronic anticoagulation This patients CHA2DS2-VASc Score  and unadjusted Ischemic Stroke Rate (% per year) is equal to 4.8 % stroke rate/year from a score of 4 (DM, HTN, CAD, age) - currently on heparin - will convert to eliquis 5 mg BID - if DCCV, will need uninterrupted OAC for 3 weeks before and 4 weeks after cardioversion - he understands this may push out his knee surgery   PAF Afib RVR Known RBBB - first diagnosed following CABG in 2018  - underwent successful OP DCCV 2018 - no longer on home OAC - start OAC as above    Chest tightness - he denies angina with walking and using the stationary bike - question if this coincided with onset of  flutter   Hypertension - olmesartan-hydrochlorothiazide - may need to hold these for rate controlling medications   CAD  NSTEMI 2018 CABG x 6 2018 Hyperlipidemia with LDL goal < 70 - no chest pain - continue ASA, BB, vascepa, lipitor, jardiance 10/15/2022: Cholesterol, Total 168; HDL 49; LDL Chol Calc (NIH) 75; Triglycerides 275 Could argue for a lower LDL given DM   DM Obesity  OSA - on CPAP - now on ozempic, metformin, jardiance    Risk Assessment/Risk Scores:   For questions or updates, please contact  [facility] Please consult [email or web address] for contact info under    Signed, Marcelino Duster, PA  05/18/2023 2:10 PM  PT seen and examined   I agree with findings as noted above by A DUke Pt is a 72 yo who has a hx of CAD (CABG in 2018), PAF (2018 s/p DCCV), HTN, morbid obesity, OSA (on CPAP at home), Type II DM     The pt presented to ER with Apple watch showing fast HR , atrial fibrillation FOund to be in atrial flutter with 2:1 conduction.    PT did  note some SOB and chest tightness with this  He has had some sinus drainage, having a hard time clearing Drainage down the back of throat   Using inhalers mult times per day   PT currently comfortable in chair   Atrial flutter rates 80s   DId ok walking halls  On exam Pt is a morbidly obese, in NAD NEck  FUll Lungs CTA   Cardiac   Irreg rate , rhythm  No sigificant murmurs  ABd  Obese   Nontender Ext 1+ pulses   Atrial flutter   New    May have been exacerbated by pt's use of inhalers   Pt also drinks signficant wine    Currently rate controlled on IV diltiazem and heparin   Rates were OK when walking earlier   Pt comfortable Would recomm rate control and anticoagulation    (ELiquis)   Plan on cardioversion in 3.5 to 4 wks time ( full 3 wks on steady state Eliquis)  CHest tightness/ SOB   I do not think this represents angina    Most likely due to atrial flutter with rapid rates and also sinus issues     FOllow  Not having symptoms now  CAD  As above    CABG in 2018  OSA  Keep on CPAP  EtOH use   At most 5 oz wine per night.  WIll see in am and then arrange for outpt follow up after d/c     Dietrich Pates MD

## 2023-05-18 NOTE — TOC Benefit Eligibility Note (Signed)
Patient Product/process development scientist completed.    The patient is insured through [facility]. Patient has Medicare and is not eligible for a copay card, but may be able to apply for patient assistance, if available.    Ran test claim for Eliquis 5 mg and the current 30 day co-pay is $140.96 due to being in Coverage Gap (donut hole).  Ran test claim for Xarelto 20 mg and the current 30 day co-pay is $135.08 due to being in Coverage Gap (donut hole).  This test claim was processed through [facility]- copay amounts may vary at other pharmacies due to pharmacy/plan contracts, or as the patient moves through the different stages of their insurance plan.     Roland Earl, CPHT Pharmacy Technician III Certified Patient Advocate [facility] Pharm[facility] Patient Advocate Team Direct Number: [phone]  Fax: [phone]

## 2023-05-18 NOTE — Progress Notes (Signed)
ANTICOAGULATION CONSULT NOTE   Pharmacy Consult for heparin ->apixaban Indication: atrial fibrillation  Allergies  Allergen Reactions   Zolpidem     Other Reaction(s): Unknown  Other Reaction(s): Dysphoria    Patient Measurements: Height: 6' (182.9 cm) Weight: (!) 147.9 kg (326 lb) IBW/kg (Calculated) : 77.6 Heparin Dosing Weight: 109 kg  Vital Signs: Temp: 97.6 F (36.4 C) (08/27 1240) Temp Source: Oral (08/27 1240) BP: 128/102 (08/27 1240) Pulse Rate: 131 (08/27 0751)  Labs: Recent Labs    05/17/23 1731 05/17/23 1834 05/17/23 2023 05/18/23 0653  HGB 14.6  --   --  14.5  HCT 42.3  --   --  43.0  PLT 185  --   --  186  HEPARINUNFRC  --   --   --  0.26*  CREATININE 1.11  --   --  1.21  TROPONINIHS  --  17 18*  --     Estimated Creatinine Clearance: 80.1 mL/min (by C-G formula based on SCr of 1.21 mg/dL).   Medical History: Past Medical History:  Diagnosis Date   ADD (attention deficit disorder)    Anxiety    Bilateral swelling of feet    Constipation    Coronary artery disease    a. s/p CABG in 03/2017 with LIMA-LAD, reverse SVG-intermediate, sequential reverse SVG-OM1-dLCx, and sequential reverse SVG-PDA-PLB   Diabetes mellitus without complication (HCC)    type 2   Dietary indiscretion    Dyspnea    GERD (gastroesophageal reflux disease)    History of heart attack    Hypertension    Obesity    Obstructive sleep apnea    PAF (paroxysmal atrial fibrillation) (HCC)    a. s/p DCCV in 04/2017   Right bundle branch block    Sleep apnea    SOB (shortness of breath)     Assessment: 72 yo M with afib and possible flutter at [facility]. Med rec pending but no history of current anticoagulation in chart or fill history prior to admission. Patient was on rivaroxaban ~2018. Pharmacy consulted for heparin.    Now plan to transition to apixaban. Noted copay is $140.96 for 30 days.  Goal of Therapy:  Heparin level 0.3-0.7 units/ml Monitor platelets by  anticoagulation protocol: Yes   Plan:  Apixaban 5mg  po BID D/c heparin when first dose of apixaban is given.  Christoper Fabian, PharmD, BCPS Please see amion for complete clinical pharmacist phone list 05/18/2023 3:49 PM

## 2023-05-18 NOTE — Plan of Care (Signed)

## 2023-05-18 NOTE — Progress Notes (Signed)
ANTICOAGULATION CONSULT NOTE - Initial Consult  Pharmacy Consult for heparin Indication: atrial fibrillation  Allergies  Allergen Reactions   Zolpidem     Other Reaction(s): Unknown    Patient Measurements: Height: 6' (182.9 cm) Weight: (!) 147.9 kg (326 lb) IBW/kg (Calculated) : 77.6 Heparin Dosing Weight: 109 kg  Vital Signs: Temp: 97.4 F (36.3 C) (08/27 0751) Temp Source: Oral (08/27 0751) BP: 101/86 (08/27 0751) Pulse Rate: 131 (08/27 0751)  Labs: Recent Labs    05/17/23 1731 05/17/23 1834 05/17/23 2023 05/18/23 0653  HGB 14.6  --   --  14.5  HCT 42.3  --   --  43.0  PLT 185  --   --  186  HEPARINUNFRC  --   --   --  0.26*  CREATININE 1.11  --   --  1.21  TROPONINIHS  --  17 18*  --     Estimated Creatinine Clearance: 80.1 mL/min (by C-G formula based on SCr of 1.21 mg/dL).   Medical History: Past Medical History:  Diagnosis Date   ADD (attention deficit disorder)    Anxiety    Bilateral swelling of feet    Constipation    Coronary artery disease    a. s/p CABG in 03/2017 with LIMA-LAD, reverse SVG-intermediate, sequential reverse SVG-OM1-dLCx, and sequential reverse SVG-PDA-PLB   Diabetes mellitus without complication (HCC)    type 2   Dietary indiscretion    Dyspnea    GERD (gastroesophageal reflux disease)    History of heart attack    Hypertension    Obesity    Obstructive sleep apnea    PAF (paroxysmal atrial fibrillation) (HCC)    a. s/p DCCV in 04/2017   Right bundle branch block    Sleep apnea    SOB (shortness of breath)     Assessment: 72 yo M with afib and possible flutter at [facility]. Med rec pending but no history of current anticoagulation in chart or fill history prior to admission. Patient was on rivaroxaban ~2018. Pharmacy consulted for heparin.    8/27 AM: Heparin level subtherapeutic at 0.26 with heparin running at 1,600 units/hour. No signs of bleeding or issues with heparin infusion per RN.   Goal of Therapy:  Heparin level  0.3-0.7 units/ml Monitor platelets by anticoagulation protocol: Yes   Plan:  Heparin bolus via infusion 1,500 units x1 then heparin infusion increase to 1800 units/hr Re-check heparin level in 8 hours  Monitor daily heparin level, CBC, signs/symptoms of bleeding   Jani Gravel, PharmD Clinical Pharmacist  05/18/2023 8:16 AM

## 2023-05-18 NOTE — Telephone Encounter (Signed)
Patient in hospital.  Advised Dr Allyson Sabal will be made aware.  She also states that "your PA from your office is here now"  So she is pleased that they are there.

## 2023-05-18 NOTE — Plan of Care (Signed)
  Problem: Education: Goal: Knowledge of General Education information will improve Description: Including pain rating scale, medication(s)/side effects and non-pharmacologic comfort measures Outcome: Progressing   Problem: Health Behavior/Discharge Planning: Goal: Ability to manage health-related needs will improve Outcome: Progressing   Problem: Clinical Measurements: Goal: Ability to maintain clinical measurements within normal limits will improve Outcome: Progressing Goal: Cardiovascular complication will be avoided Outcome: Progressing   Problem: Activity: Goal: Risk for activity intolerance will decrease Outcome: Progressing   Problem: Coping: Goal: Level of anxiety will decrease Outcome: Progressing   Problem: Pain Managment: Goal: General experience of comfort will improve Outcome: Progressing   Problem: Coping: Goal: Ability to adjust to condition or change in health will improve Outcome: Progressing   Problem: Fluid Volume: Goal: Ability to maintain a balanced intake and output will improve Outcome: Progressing   Problem: Health Behavior/Discharge Planning: Goal: Ability to identify and utilize available resources and services will improve Outcome: Progressing Goal: Ability to manage health-related needs will improve Outcome: Progressing   Problem: Metabolic: Goal: Ability to maintain appropriate glucose levels will improve Outcome: Progressing   Problem: Nutritional: Goal: Maintenance of adequate nutrition will improve Outcome: Progressing Goal: Progress toward achieving an optimal weight will improve Outcome: Progressing

## 2023-05-18 NOTE — H&P (Signed)
History and Physical      David Frost:[id number] DOB: 1950-12-23 DOA: 05/17/2023; DOS: 05/18/2023  PCP: David Fillers, MD  Patient coming from: home   I have personally briefly reviewed patient's old medical records in [facility]  Chief Complaint: Shortness of breath  HPI: David Frost is a 72 y.o. male with medical history significant for type 2 diabetes mellitus, essential pretension, hyperlipidemia, coronary disease status post CABG in July 2018, paroxysmal atrial fibrillation not chronically anticoagulated, GAD, who is admitted to [facility] on 05/17/2023 by way of transfer from [facility] with rapid atrial flutter after presenting from home to the latter facility complaining of shortness of breath.  The patient reports to 3 days of shortness of breath in the absence of any associated orthopnea, PND, or new lower extremity edema.  He also denies any associated chest pain, nausea, vomiting, diaphoresis, dizziness, presyncope, or syncope.  No recent preceding trauma or new lower extremity erythema, and or any new calf tenderness.  He also denies any associated subjective fever, chills, rigors, or generalized myalgias.  His cardiac history includes a known history of coronary artery disease status post CABG in July 2018.  Subsequently he also had a history of paroxysmal atrial fibrillation for which he underwent electrocardioversion in July 2018.  He is not subsequently on formal anticoagulation.  Per chart review, the patient had TEE in July 2018 at the time of this electrocardioversion.  Prior to that he underwent 2D echo in April 2010.  These appear to represent his most recent prior echocardiograms.    [facility] ED Course:  Vital signs in the ED were notable for the following: Afebrile; noted to be in rapid atrial flutter, with additional heart rates in the 120s to 130s, with heart rate subsequently decreasing into the low 100s  following initiation of diltiazem drip; systolic blood pressures in the low 100s to 150s; respiratory rate 20-22, oxygen saturation 95 to 96% on room air.  Labs were notable for the following:  Sodium 132, potassium 4.8, bicarbonate 26, creatinine 1.11 compared to most recent prior serum creatinine data point of 0.93 on 10/15/2022, BUN to current ratio greater than 20, glucose 141.  BNP 500, without any prior BNP data point available for point comparison.  High sensitive troponin I initially 17, with repeat value trending up slightly to 18.  TSH 2.212.  CBC notable for will with cell count 12,700, hemoglobin 14.6.  Per my interpretation, EKG in ED demonstrated the following: Atrial flutter with predominant 2-1 AV block, right bundle branch block, heart rate 128, no evidence of T wave or ST changes, including no evidence of ST elevation.  Imaging in the ED, per corresponding formal radiology read, was notable for the following: 1 view chest x-ray showed no evidence of acute cardiopulmonary process, including no evidence of infiltrate, edema, effusion, or pneumothorax.  EDP at [facility] discussed with on-call [facility] cardiology, who recommended diltiazem drip, heparin drip, TRH admission to [facility], and conveyed that cardiology will formally consult.   While in the ED, the following were administered: Xanax 0.25 mg p.o. x 1 dose, diltiazem 10 mg IV x 1 dose followed by initiation of diltiazem drip; heparin bolus followed by initiation of heparin drip; 500 cc NS bolus.  Subsequently, the patient was admitted to [facility] for further evaluation management of his presenting rapid atrial flutter, with presenting labs notable for hyponatremia as well as prerenal azotemia.  Review of Systems: As per HPI otherwise 10 point review of systems negative.   Past Medical History:  Diagnosis Date   ADD (attention deficit disorder)    Anxiety    Bilateral swelling of feet    Constipation    Coronary  artery disease    a. s/p CABG in 03/2017 with LIMA-LAD, reverse SVG-intermediate, sequential reverse SVG-OM1-dLCx, and sequential reverse SVG-PDA-PLB   Diabetes mellitus without complication (HCC)    type 2   Dietary indiscretion    Dyspnea    GERD (gastroesophageal reflux disease)    History of heart attack    Hypertension    Obesity    Obstructive sleep apnea    PAF (paroxysmal atrial fibrillation) (HCC)    a. s/p DCCV in 04/2017   Right bundle branch block    Sleep apnea    SOB (shortness of breath)     Past Surgical History:  Procedure Laterality Date   CARDIAC CATHETERIZATION     CARDIOVERSION N/A 05/04/2017   Procedure: CARDIOVERSION;  Surgeon: Chilton Si, MD;  Location: [facility] ENDOSCOPY;  Service: Cardiovascular;  Laterality: N/A;   CATARACT EXTRACTION  12/05/2019   CORONARY ARTERY BYPASS GRAFT N/A 03/22/2017   Procedure: CORONARY ARTERY BYPASS GRAFTING (CABG) x6, using the left internal mammary artery and the greater saphenous vein harvested endoscopically from the right thigh and calf and the left thigh. -LIMA to LAD -SVG to RAMUS INTERMEDIATE -SEQ SVG to OM1 and DISTAL LEFT CIRCUMFLEX -SEQ SVG to PDA and PLVB;  Surgeon: Delight Ovens, MD;  Location: [facility] OR;  Service: Open Heart Surgery;  Laterality   DOPPLER ECHOCARDIOGRAPHY  2010   NM MYOVIEW LTD  2007   TEE WITHOUT CARDIOVERSION N/A 03/22/2017   Procedure: TRANSESOPHAGEAL ECHOCARDIOGRAM (TEE);  Surgeon: Delight Ovens, MD;  Location: [facility] OR;  Service: Open Heart Surgery;  Laterality: N/A;   TONSILLECTOMY      Social History:  reports that he quit smoking about 6 years ago. His smoking use included cigars. He has never used smokeless tobacco. He reports that he does not drink alcohol and does not use drugs.   Allergies  Allergen Reactions   Zolpidem     Other Reaction(s): Unknown    Family History  Problem Relation Age of Onset   Heart disease Father    Heart attack Father     Family history  reviewed and not pertinent    Prior to Admission medications   Medication Sig Start Date End Date Taking? Authorizing Provider  acetaminophen (TYLENOL) 325 MG tablet Take 2 tablets (650 mg total) by mouth every 6 (six) hours as needed for mild pain. 03/27/17   Barrett, Erin R, PA-C  ALPRAZolam (XANAX) 0.25 MG tablet Take 1 tablet (0.25 mg total) by mouth 2 (two) times daily as needed for anxiety. 03/27/17   Barrett, Rae Roam, PA-C  aspirin EC 81 MG tablet Take 1 tablet (81 mg total) by mouth daily. 09/23/17   Barrett, Joline Salt, PA-C  atorvastatin (LIPITOR) 40 MG tablet TAKE 1 TABLET(40 MG) BY MOUTH DAILY 11/23/22   Runell Gess, MD  bimatoprost (LUMIGAN) 0.01 % SOLN Lumigan 0.01 % eye drops  INSTILL 1 DROP INTO LEFT EYE EVERY NIGHT AT BEDTIME    [provider]  icosapent Ethyl (VASCEPA) 1 g capsule TAKE 2 CAPSULES(2 GRAMS) BY MOUTH TWICE DAILY 11/25/22   Runell Gess, MD  loratadine (CLARITIN) 10 MG tablet Take 10 mg by mouth daily.    [provider]  metoprolol succinate (TOPROL-XL) 50 MG 24 hr tablet Take 1 tablet (50 mg total) by mouth daily. 09/06/19   Runell Gess, MD  Multiple Vitamins-Minerals (PRESERVISION AREDS 2 PO) Take 1 capsule by mouth 2 (two) times daily.    [provider]  nitroGLYCERIN (NITROSTAT) 0.4 MG SL tablet PLACE 1 TABLET UNDER THE TONGUE EVERY 5 MINUTES AS NEEDED FOR CHEST PAIN 02/26/20   Runell Gess, MD  olmesartan-hydrochlorothiazide (BENICAR HCT) 40-12.5 MG tablet Take 1 tablet by mouth every morning. 02/15/20   [provider]  PROAIR RESPICLICK 108 (90 Base) MCG/ACT AEPB Take 2 puffs by mouth as directed. 02/24/17   [provider]  Semaglutide,0.25 or 0.5MG /DOS, (OZEMPIC, 0.25 OR 0.5 MG/DOSE,) 2 MG/3ML SOPN INJECT 0.5 MG UNDER THE SKIN ONCE A WEEK 11/23/22   Rayburn, [name], PA-C  Vitamin D, Ergocalciferol, (DRISDOL) 1.25 MG (50000 UNIT) CAPS capsule Take 1 capsule (50,000 Units total) by mouth every 7  (seven) days. 09/10/22   Langston Reusing, MD     Objective    Physical Exam: Vitals:   05/17/23 1637 05/17/23 1900 05/17/23 1952 05/17/23 2352  BP:  (!) 126/104 107/89 (!) 139/108  Pulse:   (!) 109 (!) 109  Resp:   13 (!) 22  Temp:   98 F (36.7 C)   TempSrc:   Oral   SpO2:   96% 95%  Weight: 136.1 kg     Height: 6' (1.829 m)       General: appears to be stated age; alert, oriented Skin: warm, dry, no rash Head:  AT/[place] Mouth:  Oral mucosa membranes appear dry, normal dentition Neck: supple; trachea midline Heart: Tachycardic; did not appreciate any M/R/G Lungs: CTAB, did not appreciate any wheezes, rales, or rhonchi Abdomen: + BS; soft, ND, NT Vascular: 2+ pedal pulses b/l; 2+ radial pulses b/l Extremities: no peripheral edema, no muscle wasting Neuro: strength and sensation intact in upper and lower extremities b/l    Labs on Admission: I have personally reviewed following labs and imaging studies  CBC: Recent Labs  Lab 05/17/23 1731  WBC 12.7*  HGB 14.6  HCT 42.3  MCV 90.2  PLT 185   Basic Metabolic Panel: Recent Labs  Lab 05/17/23 1731  NA 132*  K 4.8  CL 97*  CO2 26  GLUCOSE 141*  BUN 56*  CREATININE 1.11  CALCIUM 9.1  MG 2.2   GFR: Estimated Creatinine Clearance: 83.4 mL/min (by C-G formula based on SCr of 1.11 mg/dL). Liver Function Tests: No results for input(s): "AST", "ALT", "ALKPHOS", "BILITOT", "PROT", "ALBUMIN" in the last 168 hours. No results for input(s): "LIPASE", "AMYLASE" in the last 168 hours. No results for input(s): "AMMONIA" in the last 168 hours. Coagulation Profile: No results for input(s): "INR", "PROTIME" in the last 168 hours. Cardiac Enzymes: No results for input(s): "CKTOTAL", "CKMB", "CKMBINDEX", "TROPONINI" in the last 168 hours. BNP (last 3 results) No results for input(s): "PROBNP" in the last 8760 hours. HbA1C: No results for input(s): "HGBA1C" in the last 72 hours. CBG: No results for input(s):  "GLUCAP" in the last 168 hours. Lipid Profile: No results for input(s): "CHOL", "HDL", "LDLCALC", "TRIG", "CHOLHDL", "LDLDIRECT" in the last 72 hours. Thyroid Function Tests: No results for input(s): "TSH", "T4TOTAL", "FREET4", "T3FREE", "THYROIDAB" in the last 72 hours. Anemia Panel: No results for input(s): "VITAMINB12", "FOLATE", "FERRITIN", "TIBC", "IRON", "RETICCTPCT" in the last 72 hours. Urine analysis:    Component Value Date/Time   COLORURINE YELLOW 04/03/2017 0440  APPEARANCEUR CLEAR 04/03/2017 0440   LABSPEC 1.016 04/03/2017 0440   PHURINE 5.0 04/03/2017 0440   GLUCOSEU >=500 (A) 04/03/2017 0440   HGBUR NEGATIVE 04/03/2017 0440   BILIRUBINUR NEGATIVE 04/03/2017 0440   KETONESUR NEGATIVE 04/03/2017 0440   PROTEINUR 30 (A) 04/03/2017 0440   NITRITE NEGATIVE 04/03/2017 0440   LEUKOCYTESUR NEGATIVE 04/03/2017 0440    Radiological Exams on Admission: DG Chest Portable 1 View  Result Date: 05/17/2023 CLINICAL DATA:  Atrial fibrillation. EXAM: PORTABLE CHEST 1 VIEW COMPARISON:  May 27, 2022 FINDINGS: Multiple sternal wires and vascular clips are noted. The cardiac silhouette is mildly enlarged and unchanged in size. Both lungs are clear. The visualized skeletal structures are unremarkable. IMPRESSION: 1. Evidence of prior median sternotomy/CABG. 2. No active disease. Electronically Signed   By: Aram Candela M.D.   On: 05/17/2023 18:04      Assessment/Plan   Principal Problem:   Atrial flutter with rapid ventricular response (HCC) Active Problems:   Essential hypertension   DM2 (diabetes mellitus, type 2) (HCC)   Hyperlipidemia   Acute hyponatremia   Acute prerenal azotemia   SIRS (systemic inflammatory response syndrome) (HCC)   GAD (generalized anxiety disorder)   Allergic rhinitis     #) Atrial fibrillation with RVR: In the setting of a known h/o of paroxysmal atrial fibrillation status post electrocardioversion in July 2018, but no previously  documented history of atrial flutter, presents in rapid atrial flutter with initial heart rates in the 120s to 130s, with heart rate subsequent improving into the low 100s following initiation of diltiazem drip.  BP has tolerated both the RVR as well as ensuing initiation of diltiazem drip, without any overt hypotension.  Mildly symptomatic with presenting 2 to 3 days of shortness of breath.  Unclear etiology leading to this evening's rapid atrial flutter. ACS felt to be less likely in the absence of any recent CP, while presenting EKG shows no evidence of acute ischemic changes.  No clinical or radiographic evidence to suggest acutely decompensated heart failure at this time.  Additionally, no overt underlying provocative infectious process, including chest x-ray showed no evidence of infiltrate.  Will also check urinalysis to further evaluate, particularly given the presence of mild leukocytosis.  Of note, TSH was found to be within normal limits.  Aside from TEE performed in July 2018, most recent echocardiogram appears to have occurred in April 2010.  Will pursue updated echocardiogram in the morning.  In the setting of CHA2DS2-VASc score of 4, there is an indication for chronic anticoagulation for thromboembolic prophylaxis.  Patient currently on a daily baby aspirin at home, but otherwise on no blood thinners, including no formal anticoagulation.  His home AV nodal blocking regimen consist of metoprolol succinate.   EDP discussed patient's case with on-call cardiology, recommended aforementioned diltiazem drip, heparin drip, and conveyed the cardiology will formally consult the patient in the morning.  Plan: Monitor strict I's & O's and daily weights. Monitor on telemetry. Repeat CMP/CBC in the AM.  Continue diltiazem drip.  Hold home metoprolol succinate for now, with plan to resume prior to eventual discontinuation of dilt drip.  close monitoring of ensuing BP via routine VS. heparin drip.   Cardiology to formally consult, as above.  Check procalcitonin level, urinary drug screen, urinalysis.  Echocardiogram in the morning.  Add on serum ethanol level.                  #) Acute hypo-osmolar  hyponatremia: Presenting serum sodium level  noted to be 132 compared to 135 in January 2024. suspect an element of hypovolumeia, with suspected contribution from dehydration, including the presence of acute prerenal azotemia.  There may also be a pharmacologic contribution in the setting of outpatient use of HCTZ.  Differential also includes the possibility of a contribution from SIADH.  Of note, chest x-ray showed no evidence of acute cardiopulmonary process, including no evidence of infiltrate, edema.  TSH within normal limits this evening.  Presentation not associate with any acute focal neurologic deficits nor any report of recent trauma.  The patient received a 500 cc NS bolus and [facility] prior to transfer.  Will pursue additional diagnostic workup of his acute hyponatremia, as outlined below.  Plan: monitor strict I's and O's and daily weights.  check UA, random urine sodium, urine osmolality.  Check serum osmolality to confirm suspected hypoosmolar etiology.  Repeat BMP in the morning. Check serum uric acid level, as SIADH can be associated with hypouricemia due to hyperuricuria.  Add-on serum ethanol level.  Hold home HCTZ for now.                  #) SIRS criteria present: Presentation associated with mild leukocytosis, with blood cell count of 12,700, along with tachycardia in the setting of presenting in rapid atrial flutter, along with mild tachypnea.  However, in the absence of e/o underlying infection at this time, including, chest x-ray which shows no evidence of infiltrate, criteria for sepsis not currently met.  patient appears hemodynamically stable at this time.  Consequently, will refrain from initiation of IV antibiotics at this time.    Plan: Repeat CBC with diff in the AM.  Monitor strict I's and O's and daily weights.  Monitor on telemetry. Refraining from IV abx for now, as above.  Check urinalysis, procalcitonin.                     #) Type 2 Diabetes Mellitus: documented history of such. Home insulin regimen: None. Home oral hypoglycemic agents: None.  Rather, the patient is on Ozempic at home.  Presenting blood sugar: 141. Most recent A1c noted to be 6.6% when checked in January 2024.  Plan: accuchecks QAC and HS with low dose SSI.  Hold Ozempic during this hospitalization.                  #) Essential Hypertension: documented h/o such, with outpatient antihypertensive regimen including metoprolol succinate, olmesartan, and HCTZ.  SBP's in the ED today: Low 100s to 150s mmHg. in the setting of presenting acute hyponatremia, will hold home HCTZ for now.  As the patient is currently on diltiazem drip, will hold home metoprolol succinate while remaining on the drip.  Plan: Close monitoring of subsequent BP via routine VS. hold him beta-blocker and HCTZ for now, as above.  Resume home olmesartan.                   #) Hyperlipidemia: documented h/o such. On high intensity atorvastatin as well as Vascepa as outpatient.   Plan: continue home statin and Vascepa.                    #) Generalized anxiety disorder: documented h/o such. On prn Xanax as outpatient.  Of note, is not on any scheduled SSRI or SNRI as an outpatient.  TSH found to be within normal limits when checked earlier this evening.  Plan: Continue outpatient prn Xanax.                  #)  Allergic Rhinitis: documented h/o such, on scheduled Claritin as an outpatient, in the absence of any scheduled intranasal corticosteroid.  In the setting of presenting rapid atrial flutter, will hold him Claritin for now, to avoid any contributory anticholinergic consequences of this  medication  Plan: Hold home Claritin for now.    DVT prophylaxis: heparin drip  Code Status: Full code Family Communication: none Disposition Plan: Per Rounding Team Consults called: EDP at [facility] d/w on-call [facility] cardiology, who will formally consult, as further detailed above;  Admission status: inpatient     I SPENT GREATER THAN 75  MINUTES IN CLINICAL CARE TIME/MEDICAL DECISION-MAKING IN COMPLETING THIS ADMISSION.      Chaney Born Lalita Ebel DO [facility]  From 7PM - 7AM   05/18/2023, 12:16 AM

## 2023-05-18 NOTE — Progress Notes (Signed)
*  PRELIMINARY RESULTS* Echocardiogram 2D Echocardiogram has been performed.  David Frost 05/18/2023, 12:41 PM

## 2023-05-19 ENCOUNTER — Inpatient Hospital Stay (HOSPITAL_COMMUNITY): Payer: Medicare Other

## 2023-05-19 ENCOUNTER — Inpatient Hospital Stay (HOSPITAL_COMMUNITY): Payer: Medicare Other | Admitting: Registered Nurse

## 2023-05-19 ENCOUNTER — Encounter (HOSPITAL_COMMUNITY): Payer: Self-pay | Admitting: Internal Medicine

## 2023-05-19 ENCOUNTER — Other Ambulatory Visit: Payer: Self-pay

## 2023-05-19 ENCOUNTER — Encounter (HOSPITAL_COMMUNITY)
Admission: EM | Disposition: A | Discharge status: Home / Self Care | Payer: Self-pay | Source: Home / Self Care | Attending: Internal Medicine

## 2023-05-19 DIAGNOSIS — I484 Atypical atrial flutter: Secondary | ICD-10-CM | POA: Diagnosis not present

## 2023-05-19 DIAGNOSIS — I4892 Unspecified atrial flutter: Secondary | ICD-10-CM | POA: Diagnosis not present

## 2023-05-19 DIAGNOSIS — I10 Essential (primary) hypertension: Secondary | ICD-10-CM

## 2023-05-19 DIAGNOSIS — Z87891 Personal history of nicotine dependence: Secondary | ICD-10-CM

## 2023-05-19 DIAGNOSIS — I2510 Atherosclerotic heart disease of native coronary artery without angina pectoris: Secondary | ICD-10-CM

## 2023-05-19 HISTORY — PX: CARDIOVERSION: SHX1299

## 2023-05-19 HISTORY — PX: TEE WITHOUT CARDIOVERSION: SHX5443

## 2023-05-19 LAB — BASIC METABOLIC PANEL
Anion gap: 9 (ref 5–15)
BUN: 30 mg/dL — ABNORMAL HIGH (ref 8–23)
CO2: 24 mmol/L (ref 22–32)
Calcium: 8.8 mg/dL — ABNORMAL LOW (ref 8.9–10.3)
Chloride: 100 mmol/L (ref 98–111)
Creatinine, Ser: 1.01 mg/dL (ref 0.61–1.24)
GFR, Estimated: >60 mL/min (ref >60)
Glucose, Bld: 156 mg/dL — ABNORMAL HIGH (ref 70–99)
Potassium: 4.3 mmol/L (ref 3.5–5.1)
Sodium: 133 mmol/L — ABNORMAL LOW (ref 135–145)

## 2023-05-19 LAB — CBC
HCT: 41.9 % (ref 39.0–52.0)
Hemoglobin: 13.9 g/dL (ref 13.0–17.0)
MCH: 30.4 pg (ref 26.0–34.0)
MCHC: 33.2 g/dL (ref 30.0–36.0)
MCV: 91.7 fL (ref 80.0–100.0)
Platelets: 165 10*3/uL (ref 150–400)
RBC: 4.57 MIL/uL (ref 4.22–5.81)
RDW: 14.1 % (ref 11.5–15.5)
WBC: 13.2 10*3/uL — ABNORMAL HIGH (ref 4.0–10.5)
nRBC: 0 % (ref 0.0–0.2)

## 2023-05-19 LAB — GLUCOSE, CAPILLARY
Glucose-Capillary: 169 mg/dL — ABNORMAL HIGH (ref 70–99)
Glucose-Capillary: 183 mg/dL — ABNORMAL HIGH (ref 70–99)
Glucose-Capillary: 246 mg/dL — ABNORMAL HIGH (ref 70–99)
Glucose-Capillary: 165 mg/dL — ABNORMAL HIGH (ref 70–99)

## 2023-05-19 SURGERY — ECHOCARDIOGRAM, TRANSESOPHAGEAL
Anesthesia: Monitor Anesthesia Care

## 2023-05-19 MED ORDER — SODIUM CHLORIDE 0.9 % IV SOLN: INTRAVENOUS | Status: DC

## 2023-05-19 MED ORDER — LIDOCAINE 2% (20 MG/ML) 5 ML SYRINGE
INTRAMUSCULAR | Status: DC | PRN
  Administered 2023-05-19: 100 mg via INTRAVENOUS

## 2023-05-19 MED ORDER — DILTIAZEM HCL-DEXTROSE 125-5 MG/125ML-% IV SOLN (PREMIX)
5.0000 mg/h | INTRAVENOUS | Status: DC
  Administered 2023-05-19: 15 mg/h via INTRAVENOUS
  Administered 2023-05-19: 5 mg/h via INTRAVENOUS
  Filled 2023-05-19 (×2): qty 125

## 2023-05-19 MED ORDER — PROPOFOL 10 MG/ML IV BOLUS
INTRAVENOUS | Status: DC | PRN
Start: 2023-05-19 — End: 2023-05-19
  Administered 2023-05-19: 50 mg via INTRAVENOUS
  Administered 2023-05-19: 100 ug/kg/min via INTRAVENOUS

## 2023-05-19 MED ORDER — PHENYLEPHRINE HCL (PRESSORS) 10 MG/ML IV SOLN
INTRAVENOUS | Status: DC | PRN
  Administered 2023-05-19 (×2): 160 ug via INTRAVENOUS

## 2023-05-19 SURGICAL SUPPLY — 1 items: ELECT DEFIB PAD ADLT CADENCE (PAD) ×2 IMPLANT

## 2023-05-19 NOTE — Interval H&P Note (Signed)
History and Physical Interval Note:  05/19/2023 11:53 AM  David Frost  has presented today for surgery, with the diagnosis of afib.  The various methods of treatment have been discussed with the patient and family. After consideration of risks, benefits and other options for treatment, the patient has consented to  Procedure(s): TRANSESOPHAGEAL ECHOCARDIOGRAM (N/A) CARDIOVERSION (N/A) as a surgical intervention.  The patient's history has been reviewed, patient examined, no change in status, stable for surgery.  I have reviewed the patient's chart and labs.  Questions were answered to the patient's satisfaction.     [name] [name]

## 2023-05-19 NOTE — Anesthesia Preprocedure Evaluation (Signed)
Anesthesia Evaluation  Patient identified by MRN, date of birth, ID band Patient awake    Reviewed: Allergy & Precautions, NPO status , Patient's Chart, lab work & pertinent test results, reviewed documented beta blocker date and time   Airway Mallampati: III       Dental no notable dental hx.    Pulmonary sleep apnea , former smoker   Pulmonary exam normal        Cardiovascular hypertension, Pt. on medications and Pt. on home beta blockers + CAD, + Past MI and + CABG  + dysrhythmias Atrial Fibrillation  Rhythm:Irregular Rate:Normal     Neuro/Psych    GI/Hepatic ,GERD  ,,  Endo/Other  diabetes, Type 2, Oral Hypoglycemic Agents  Morbid obesity  Renal/GU      Musculoskeletal   Abdominal  (+) + obese  Peds  Hematology  (+) Blood dyscrasia, FACTOR VIII (HEMOPHILIA)   Anesthesia Other Findings   Reproductive/Obstetrics                             Anesthesia Physical Anesthesia Plan  ASA: 3  Anesthesia Plan: MAC and General   Post-op Pain Management:    Induction:   PONV Risk Score and Plan: 2  Airway Management Planned: Mask and Natural Airway  Additional Equipment: None  Intra-op Plan:   Post-operative Plan:   Informed Consent: I have reviewed the patients History and Physical, chart, labs and discussed the procedure including the risks, benefits and alternatives for the proposed anesthesia with the patient or authorized representative who has indicated his/her understanding and acceptance.     Dental advisory given  Plan Discussed with: CRNA  Anesthesia Plan Comments:        Anesthesia Quick Evaluation

## 2023-05-19 NOTE — Plan of Care (Signed)
  Problem: Elimination: Goal: Will not experience complications related to bowel motility Outcome: Progressing Goal: Will not experience complications related to urinary retention Outcome: Progressing   Problem: Pain Managment: Goal: General experience of comfort will improve Outcome: Progressing   

## 2023-05-19 NOTE — Progress Notes (Signed)
Brief Cardiology update note:  Notified of increase in HR trend to 120-130s.  Review of telemetry shows steady HR trend upward and now persistently in the 120-130 range from 70-80s in PM of 8/27.  More consistent 2:1 flutter vs variable block previously in the evening.  PO Dilt given at 1637 with gtt stopped at 1639 (8/27).  Gtt last recorded at 15 mg/hr.  Patient sleeping and BP stable.  Plan: restart Dilt gtt with titration per protocol while PO Dilt takes effect.  Marya Amsler, MD Cardiology Fellow

## 2023-05-19 NOTE — Evaluation (Signed)
Physical Therapy Evaluation & Discharge Patient Details Name: David Frost MRN: [id number] DOB: [birth year] Today's Date: 05/19/2023  History of Present Illness  Patient is a 72 y/o male admitted 05/17/23 with SOB, found to be in rapid a-flutter with RBBB.  Underwent cardioversion on 05/19/23.  PMH positive for HTN, DM2, HLD, GAD, NSTEMI 2018 and CABG x 6, OSA on CPAP and obesity.  Clinical Impression  Patient presents with mobility close to baseline.  Some mild SOB with ambulation on RA though VSS just post cardioversion.  Patient previously walking with cane and discussed pt's plans for weight loss to qualify for knee surgery.  Discussed options for exercise though pt with good regimine with stationary bike.  No further skilled PT needs at this time.  Will sign off.         If plan is discharge home, recommend the following:     Can travel by private vehicle        Equipment Recommendations None recommended by PT  Recommendations for Other Services       Functional Status Assessment Patient has not had a recent decline in their functional status     Precautions / Restrictions Precautions Precaution Comments: severe degenerative knee OA      Mobility  Bed Mobility               General bed mobility comments: up in chair    Transfers Overall transfer level: Modified independent                      Ambulation/Gait Ambulation/Gait assistance: Modified independent (Device/Increase time) Gait Distance (Feet): 270 Feet Assistive device: Straight cane Gait Pattern/deviations: Step-through pattern, Antalgic, Wide base of support       General Gait Details: donned shorts and shoes prior to ambulation; mildly antalgic with significant knee OA, using cane  Stairs            Wheelchair Mobility     Tilt Bed    Modified Rankin (Stroke Patients Only)       Balance Overall balance assessment: Mild deficits observed, not formally tested                                            Pertinent Vitals/Pain Pain Assessment Pain Assessment: 0-10 Pain Score: 6  Pain Location: knees with ambulation Pain Descriptors / Indicators: Aching Pain Intervention(s): Monitored during session    Home Living Family/patient expects to be discharged to:: Private residence Living Arrangements: Other (Comment) (ex-wife) Available Help at Discharge: Family Type of Home: House Home Access: Stairs to enter Entrance Stairs-Rails: Right Entrance Stairs-Number of Steps: 3   Home Layout: One level Home Equipment: Agricultural consultant (2 wheels);Cane - single point;Shower seat - built in Additional Comments: sleeps in a chair with feet down, wears compression socks and has a pump that he uses for legs foot to thigh every morning    Prior Function Prior Level of Function : Independent/Modified Independent             Mobility Comments: uses cane at home ADLs Comments: lives with ex-wife who does the cleaning, they eat out frequently     Extremity/Trunk Assessment   Upper Extremity Assessment Upper Extremity Assessment: Overall WFL for tasks assessed    Lower Extremity Assessment Lower Extremity Assessment: Overall WFL for tasks assessed (bilateral LE's with edema, L  LE with draining wound (covered with mepicel); some dark coloration as well)    Cervical / Trunk Assessment Cervical / Trunk Assessment: Other exceptions Cervical / Trunk Exceptions: increased body habitus  Communication      Cognition Arousal: Alert Behavior During Therapy: WFL for tasks assessed/performed Overall Cognitive Status: Within Functional Limits for tasks assessed                                          General Comments General comments (skin integrity, edema, etc.): SpO2 99%, HR 110 with ambulation, down to 80's with seated rest x 2 minutes, BP 138/87    Exercises     Assessment/Plan    PT Assessment Patient does not need any  further PT services  PT Problem List         PT Treatment Interventions      PT Goals (Current goals can be found in the Care Plan section)  Acute Rehab PT Goals PT Goal Formulation: All assessment and education complete, DC therapy    Frequency       Co-evaluation               AM-PAC PT "6 Clicks" Mobility  Outcome Measure Help needed turning from your back to your side while in a flat bed without using bedrails?: None (sleeps in chair) Help needed moving from lying on your back to sitting on the side of a flat bed without using bedrails?: None Help needed moving to and from a bed to a chair (including a wheelchair)?: None Help needed standing up from a chair using your arms (e.g., wheelchair or bedside chair)?: None Help needed to walk in hospital room?: None Help needed climbing 3-5 steps with a railing? : Total 6 Click Score: 21    End of Session   Activity Tolerance: Patient tolerated treatment well Patient left: in chair   PT Visit Diagnosis: Difficulty in walking, not elsewhere classified (R26.2)    Time: 1610-9604 PT Time Calculation (min) (ACUTE ONLY): 25 min   Charges:   PT Evaluation $PT Eval Low Complexity: 1 Low PT Treatments $Self Care/Home Management: 8-22 PT General Charges $$ ACUTE PT VISIT: 1 Visit         David Frost, PT Acute Rehabilitation Services Office:[phone] 05/19/2023   David Frost 05/19/2023, 3:36 PM

## 2023-05-19 NOTE — H&P (View-Only) (Signed)
Rounding Note    Patient Name: David Frost Date of Encounter: 05/19/2023  [facility] Cardiologist: Nanetta Batty, MD   Subjective   Anxious  Deneis CP  Breathing is ok   Inpatient Medications    Scheduled Meds:  apixaban  5 mg Oral BID   atorvastatin  40 mg Oral Daily   diltiazem  360 mg Oral Daily   insulin aspart  0-9 Units Subcutaneous TID WC   irbesartan  150 mg Oral Daily   polyethylene glycol  17 g Oral Daily   senna  1 tablet Oral BID   Continuous Infusions:  diltiazem (CARDIZEM) infusion 15 mg/hr (05/19/23 0700)   PRN Meds: acetaminophen **OR** acetaminophen, ALPRAZolam, melatonin, ondansetron (ZOFRAN) IV   Vital Signs    Vitals:   05/19/23 0551 05/19/23 0605 05/19/23 0700 05/19/23 0900  BP:  118/86 124/83   Pulse:      Resp:  18 19   Temp:    98.4 F (36.9 C)  TempSrc:    Oral  SpO2:      Weight: (!) 146.3 kg     Height:        Intake/Output Summary (Last 24 hours) at 05/19/2023 0959 Last data filed at 05/19/2023 0700 Gross per 24 hour  Intake 432.6 ml  Output --  Net 432.6 ml      05/19/2023    5:51 AM 05/18/2023    5:53 AM 05/17/2023    4:37 PM  Last 3 Weights  Weight (lbs) 322 lb 8 oz 326 lb 300 lb  Weight (kg) 146.285 kg 147.873 kg 136.079 kg      Telemetry    Atrial flutter with RVR  130s  - Personally Reviewed  ECG   No new  - Personally Reviewed  Physical Exam   GEN: Morbidly obese 72 yo.   Neck: Full Cardiac: Irreg irreg    Respiratory: Clear to auscultation  GI: Soft  Obese  MS: 1+ edema; Weeping ankles  Labs    High Sensitivity Troponin:   Recent Labs  Lab 05/17/23 1834 05/17/23 2023  TROPONINIHS 17 18*     Chemistry Recent Labs  Lab 05/17/23 1731 05/18/23 0653 05/19/23 0435  NA 132* 134* 133*  K 4.8 3.9 4.3  CL 97* 100 100  CO2 26 24 24   GLUCOSE 141* 151* 156*  BUN 56* 43* 30*  CREATININE 1.11 1.21 1.01  CALCIUM 9.1 8.9 8.8*  MG 2.2 2.1  --   PROT  --  6.3*  --   ALBUMIN  --  3.3*   --   AST  --  18  --   ALT  --  48*  --   ALKPHOS  --  56  --   BILITOT  --  0.7  --   GFRNONAA >60 >60 >60  ANIONGAP 9 10 9     Lipids No results for input(s): "CHOL", "TRIG", "HDL", "LABVLDL", "LDLCALC", "CHOLHDL" in the last 168 hours.  Hematology Recent Labs  Lab 05/17/23 1731 05/18/23 0653 05/19/23 0435  WBC 12.7* 14.0* 13.2*  RBC 4.69 4.73 4.57  HGB 14.6 14.5 13.9  HCT 42.3 43.0 41.9  MCV 90.2 90.9 91.7  MCH 31.1 30.7 30.4  MCHC 34.5 33.7 33.2  RDW 13.9 14.0 14.1  PLT 185 186 165   Thyroid  Recent Labs  Lab 05/17/23 1731  TSH 2.212    BNP Recent Labs  Lab 05/17/23 1731  BNP 500.6*    DDimer No results for input(s): "  DDIMER" in the last 168 hours.   Radiology    ECHOCARDIOGRAM COMPLETE  Result Date: 05/18/2023    ECHOCARDIOGRAM REPORT   Patient Name:   MAIKEL GREIMAN Date of Exam: 05/18/2023 Medical Rec #:  [id number]       Height:       72.0 in Accession #:    [id number]      Weight:       326.0 lb Date of Birth:  05-02-1951       BSA:          2.622 m Patient Age:    72 years        BP:           101/86 mmHg Patient Gender: M               HR:           95 bpm. Exam Location:  Inpatient Procedure: 2D Echo, Cardiac Doppler and Color Doppler Indications:    Atrial Flutter I48.92  History:        Patient has prior history of Echocardiogram examinations, most                 recent 03/22/2017. Previous Myocardial Infarction, Prior CABG,                 Arrythmias:Atrial Flutter, Atrial Fibrillation and RBBB; Risk                 Factors:Dyslipidemia, Hypertension and Former Smoker.  Sonographer:    Dondra Prader RVT RCS Referring Phys: [id number] Angie Fava  Sonographer Comments: Technically difficult study due to poor echo windows, suboptimal subcostal window, suboptimal apical window, suboptimal parasternal window and patient is obese. Image acquisition challenging due to patient body habitus and Unable to  lie still for exam. Unable to get accurate measurements due to  elevated HR and A-fib. 134 BPM IMPRESSIONS  1. Left ventricular ejection fraction, by estimation, is 65 to 70%. The left ventricle has normal function. Left ventricular endocardial border not optimally defined to evaluate regional wall motion. Left ventricular diastolic function could not be evaluated.  2. Right ventricular systolic function was not well visualized. The right ventricular size is not well visualized.  3. Left atrial size was mildly dilated.  4. Right atrial size was mildly dilated.  5. The mitral valve is abnormal. No evidence of mitral valve regurgitation. No evidence of mitral stenosis.  6. The aortic valve was not well visualized. Aortic valve regurgitation is not visualized.  7. Aortic dilatation noted. 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  8. The inferior vena cava is dilated in size with >50% respiratory variability, suggesting right atrial pressure of 8 mmHg. Comparison(s): Similar to 2018 study. FINDINGS  Left Ventricle: Left ventricular ejection fraction, by estimation, is 65 to 70%. The left ventricle has normal function. Left ventricular endocardial border not optimally defined to evaluate regional wall motion. The left ventricular internal cavity size was small. Suboptimal image quality limits for assessment of left ventricular hypertrophy. Left ventricular diastolic function could not be evaluated due to atrial fibrillation. Left ventricular diastolic function could not be evaluated. Right Ventricle: The right ventricular size is not well visualized. No increase in right ventricular wall thickness. Right ventricular systolic function was not well visualized. Left Atrium: Left atrial size was mildly dilated. Right Atrium: Right atrial size was mildly dilated. Pericardium: Trivial pericardial effusion is present. The pericardial effusion is surrounding the apex.  Mitral Valve: The mitral valve is abnormal. No evidence of mitral valve regurgitation. No evidence  of mitral valve stenosis. Tricuspid Valve: The tricuspid valve is not well visualized. Tricuspid valve regurgitation is not demonstrated. No evidence of tricuspid stenosis. Aortic Valve: The aortic valve was not well visualized. Aortic valve regurgitation is not visualized. Aortic valve mean gradient measures 11.0 mmHg. Aortic valve peak gradient measures 16.4 mmHg. Aortic valve area, by VTI measures 2.81 cm. Pulmonic Valve: The pulmonic valve was not well visualized. Pulmonic valve regurgitation is not visualized. No evidence of pulmonic stenosis. Aorta: Aortic dilatation noted. 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 Venous: The inferior vena cava is dilated in size with greater than 50% respiratory variability, suggesting right atrial pressure of 8 mmHg. IAS/Shunts: No atrial level shunt detected by color flow Doppler.  LEFT VENTRICLE PLAX 2D LVOT diam:     2.30 cm LV SV:         73 LV SV Index:   28 LVOT Area:     4.15 cm  IVC IVC diam: 2.40 cm AORTIC VALVE                     PULMONIC VALVE AV Area (Vmax):    2.77 cm      PV Vmax:       1.15 m/s AV Area (Vmean):   2.64 cm      PV Peak grad:  5.3 mmHg AV Area (VTI):     2.81 cm AV Vmax:           202.67 cm/s AV Vmean:          132.667 cm/s AV VTI:            0.258 m AV Peak Grad:      16.4 mmHg AV Mean Grad:      11.0 mmHg LVOT Vmax:         135.00 cm/s LVOT Vmean:        84.300 cm/s LVOT VTI:          0.175 m LVOT/AV VTI ratio: 0.68  AORTA Ao Root diam: 3.60 cm Ao Asc diam:  4.40 cm  SHUNTS Systemic VTI:  0.18 m Systemic Diam: 2.30 cm Riley Lam MD Electronically signed by Riley Lam MD Signature Date/Time: 05/18/2023/1:24:31 PM    Final    DG Chest Portable 1 View  Result Date: 05/17/2023 CLINICAL DATA:  Atrial fibrillation. EXAM: PORTABLE CHEST 1 VIEW COMPARISON:  May 27, 2022 FINDINGS: Multiple sternal wires and vascular clips are noted. The cardiac silhouette is mildly enlarged and unchanged in  size. Both lungs are clear. The visualized skeletal structures are unremarkable. IMPRESSION: 1. Evidence of prior median sternotomy/CABG. 2. No active disease. Electronically Signed   By: Aram Candela M.D.   On: 05/17/2023 18:04    Cardiac Studies   Echo 05/18/23: 1. Left ventricular ejection fraction, by estimation, is 65 to 70%. The  left ventricle has normal function. Left ventricular endocardial border  not optimally defined to evaluate regional wall motion. Left ventricular  diastolic function could not be  evaluated.   2. Right ventricular systolic function was not well visualized. The right  ventricular size is not well visualized.   3. Left atrial size was mildly dilated.   4. Right atrial size was mildly dilated.   5. The mitral valve is abnormal. No evidence of mitral valve  regurgitation. No evidence of mitral stenosis.   6. The aortic  valve was not well visualized. Aortic valve regurgitation  is not visualized.   7. Aortic dilatation noted. There is mild dilatation of the ascending  aorta, measuring 44 mm.   8. The inferior vena cava is dilated in size with >50% respiratory  variability, suggesting right atrial pressure of 8 mmHg.     Patient Profile     MALCOME SLAGHT is a 72 y.o. 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 Renford Dills.    Assessment & Plan   1  Atrial flutter   Pt's HR increased last night    Given this will plan for TEE and possible cardioversion today   Pt switched to Eliquis    On IV dilt now   2.  Hx CAD NSTEMI and then  CABG x 6 in 2018  Denies CP     3  HL   On lipitor  5  HFpEF  Pt with volume overload with edema   Cardiovert  Attempt diuresis after      Jardiance held for now    6  HTN  BP controlled   7  OSA  Has CPAP at home   8  Morbid obesity  REviewed diet   Was on mounjaro  (last dose last Wednesday)  Work on diet  Cut out wine  For questions or updates, please contact [facility] Please consult [email or web address] for contact info under        Signed, Dietrich Pates, MD  05/19/2023, 9:59 AM

## 2023-05-19 NOTE — Progress Notes (Signed)
PT Cancellation Note  Patient Details Name: David Frost MRN: [id number] DOB: 1950/11/11   Cancelled Treatment:    Reason Eval/Treat Not Completed: Patient at procedure or test/unavailable; heading down for cardioversion.  Will attempt later as able.    [name] 05/19/2023, 11:05 AM Sheran Lawless, PT Acute Rehabilitation Services [phone] 05/19/2023

## 2023-05-19 NOTE — Discharge Instructions (Signed)

## 2023-05-19 NOTE — Progress Notes (Signed)
@  1914 Dr. Geraldo Pitter, on-call for cardiology, text-paged regarding pt's sustained HR 120s-130s Afib vs. Aflutter w/RVR (asymptomatic). Page promptly returned and orders received to restart Cardizem gtt. Will implement and continue to monitor.

## 2023-05-19 NOTE — Progress Notes (Addendum)
PROGRESS NOTE  David Frost  WUJ:[id number] DOB: [birth year] DOA: 05/17/2023 PCP: David Fillers, MD   Brief Narrative: Patient is a 49 male with history of diabetes type 2, hypertension, hyperlipidemia, coronary artery disease status post CABG, paroxysmal A-fib not on anticoagulation status post cardioversion in July 2018, sleep apnea on CPAP, GAD who initially presented with shortness of breath.  Found to be in rapid A flutter on presentation.  TSH normal.  EKG showed atrial flutter with 2 is to 1 AV block, right bundle branch block.  Patient was admitted for the management of A-fib with RVR.  Cardiology consulted.  Currently in Cardizem drip, heparin drip.  Plan for cardioversion  Assessment & Plan:  Principal Problem:   Atrial flutter with rapid ventricular response (HCC) Active Problems:   Essential hypertension   DM2 (diabetes mellitus, type 2) (HCC)   Hyperlipidemia   Acute hyponatremia   Acute prerenal azotemia   SIRS (systemic inflammatory response syndrome) (HCC)   GAD (generalized anxiety disorder)   Allergic rhinitis   A-fib with RVR: History of paroxysmal A-fib status post electrocardioversion in July 2018.  Presented with rapid A flutter.  TSH is normal.  Cardiology consulted.  On heparin drip, Cardizem drip.  Remains on A-fib with RVR this morning.  Plan for cardioversion. TTE showed EF of 65 to 70%.  Diastolic function is currently due to mild  Hyponatremia: Sodium of 132 on presentation.Home HCTZ on hold.    Leukocytosis:   Likely reactive.  Continue to monitor  Diabetes type 2: Recent A1c of 6.6.  Takes Ozempic.  Continue sliding scale.  Monitor blood sugars  Hypertension: Takes olmesartan, HCTZ, metoprolol at home.  Currently on diltiazem drip. Hydrochlorothiazide on hold  Hyperlipidemia: On Lipitor, Vascepa  GAD: Takes Xanax at home.  Allergic rhinitis:Takes Claritin at home  Sleep apnea: On CPAP  Morbid obesity: BMI 44.1  Bilateral lower extremity  edema/superficial ulcers: Ulcers are most likely precipitated due to persistent leg edema/venous insufficiency.  He might benefit with compression stockings/Ace wraps.  We recommend  to follow-up with vein clinic as outpatient  Deconditioning/debility :We will request a PT evaluation        DVT prophylaxis:heparin iv apixaban (ELIQUIS) tablet 5 mg     Code Status: Full Code  Family Communication: Discussed with wife at bedside at 8/27  Patient status:Inpatient  Patient is from :Home  Anticipated discharge NF:AOZH  Estimated DC date:1-2 days   Consultants: cardiology  Procedures:None  Antimicrobials:  Anti-infectives (From admission, onward)    None       Subjective: Patient seen and examined at bedside today.  Hemodynamically stable.  Sitting in the chair.  Remains in A-fib with RVR this morning.  Denies chest pain or shortness of breath.  Plan for TEE cardioversion  Objective: Vitals:   05/19/23 1320 05/19/23 1325 05/19/23 1328 05/19/23 1330  BP: 113/68 116/70 103/69 (!) 124/56  Pulse: 72 71 73 68  Resp: 20 (!) 25 15 (!) 21  Temp:      TempSrc:      SpO2: 96% 97% (!) 88% 94%  Weight:      Height:        Intake/Output Summary (Last 24 hours) at 05/19/2023 1334 Last data filed at 05/19/2023 1301 Gross per 24 hour  Intake 807.21 ml  Output 0 ml  Net 807.21 ml   Filed Weights   05/18/23 0553 05/19/23 0551 05/19/23 1110  Weight: (!) 147.9 kg (!) 146.3 kg (!) 147.9 kg  Examination:   General exam: Overall comfortable, not in distress, morbidly obese HEENT: PERRL Respiratory system:  no wheezes or crackles  Cardiovascular system: Irregularly irregular rhythm.  Gastrointestinal system: Abdomen is distended, soft and nontender. Central nervous system: Alert and oriented Extremities: Bilateral lower extremity pitting edema, chronic venous stasis changes, shallow ulcerations  skin: Superficial  ulcers on bilateral lower extremities   Data Reviewed: I  have personally reviewed following labs and imaging studies  CBC: Recent Labs  Lab 05/17/23 1731 05/18/23 0653 05/19/23 0435  WBC 12.7* 14.0* 13.2*  HGB 14.6 14.5 13.9  HCT 42.3 43.0 41.9  MCV 90.2 90.9 91.7  PLT 185 186 165   Basic Metabolic Panel: Recent Labs  Lab 05/17/23 1731 05/18/23 0653 05/19/23 0435  NA 132* 134* 133*  K 4.8 3.9 4.3  CL 97* 100 100  CO2 26 24 24   GLUCOSE 141* 151* 156*  BUN 56* 43* 30*  CREATININE 1.11 1.21 1.01  CALCIUM 9.1 8.9 8.8*  MG 2.2 2.1  --      No results found for this or any previous visit (from the past 240 hour(s)).   Radiology Studies: EP STUDY  Result Date: 05/19/2023 See surgical note for result.  ECHOCARDIOGRAM COMPLETE  Result Date: 05/18/2023    ECHOCARDIOGRAM REPORT   Patient Name:   David Frost Date of Exam: 05/18/2023 Medical Rec #:  [id number]       Height:       72.0 in Accession #:    [id number]      Weight:       326.0 lb Date of Birth:  April 04, 1951       BSA:          2.622 m Patient Age:    72 years        BP:           101/86 mmHg Patient Gender: M               HR:           95 bpm. Exam Location:  Inpatient Procedure: 2D Echo, Cardiac Doppler and Color Doppler Indications:    Atrial Flutter I48.92  History:        Patient has prior history of Echocardiogram examinations, most                 recent 03/22/2017. Previous Myocardial Infarction, Prior CABG,                 Arrythmias:Atrial Flutter, Atrial Fibrillation and RBBB; Risk                 Factors:Dyslipidemia, Hypertension and Former Smoker.  Sonographer:    David Frost RVT RCS Referring Phys: [id number] David Frost  Sonographer Comments: Technically difficult study due to poor echo windows, suboptimal subcostal window, suboptimal apical window, suboptimal parasternal window and patient is obese. Image acquisition challenging due to patient body habitus and Unable to  lie still for exam. Unable to get accurate measurements due to elevated HR and A-fib. 134 BPM  IMPRESSIONS  1. Left ventricular ejection fraction, by estimation, is 65 to 70%. The left ventricle has normal function. Left ventricular endocardial border not optimally defined to evaluate regional wall motion. Left ventricular diastolic function could not be evaluated.  2. Right ventricular systolic function was not well visualized. The right ventricular size is not well visualized.  3. Left atrial size was mildly dilated.  4. Right atrial size was mildly dilated.  5. The mitral valve is abnormal. No evidence of mitral valve regurgitation. No evidence of mitral stenosis.  6. The aortic valve was not well visualized. Aortic valve regurgitation is not visualized.  7. Aortic dilatation noted. 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  8. The inferior vena cava is dilated in size with >50% respiratory variability, suggesting right atrial pressure of 8 mmHg. Comparison(s): Similar to 2018 study. FINDINGS  Left Ventricle: Left ventricular ejection fraction, by estimation, is 65 to 70%. The left ventricle has normal function. Left ventricular endocardial border not optimally defined to evaluate regional wall motion. The left ventricular internal cavity size was small. Suboptimal image quality limits for assessment of left ventricular hypertrophy. Left ventricular diastolic function could not be evaluated due to atrial fibrillation. Left ventricular diastolic function could not be evaluated. Right Ventricle: The right ventricular size is not well visualized. No increase in right ventricular wall thickness. Right ventricular systolic function was not well visualized. Left Atrium: Left atrial size was mildly dilated. Right Atrium: Right atrial size was mildly dilated. Pericardium: Trivial pericardial effusion is present. The pericardial effusion is surrounding the apex. Mitral Valve: The mitral valve is abnormal. No evidence of mitral valve regurgitation. No evidence of mitral valve stenosis.  Tricuspid Valve: The tricuspid valve is not well visualized. Tricuspid valve regurgitation is not demonstrated. No evidence of tricuspid stenosis. Aortic Valve: The aortic valve was not well visualized. Aortic valve regurgitation is not visualized. Aortic valve mean gradient measures 11.0 mmHg. Aortic valve peak gradient measures 16.4 mmHg. Aortic valve area, by VTI measures 2.81 cm. Pulmonic Valve: The pulmonic valve was not well visualized. Pulmonic valve regurgitation is not visualized. No evidence of pulmonic stenosis. Aorta: Aortic dilatation noted. 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 Venous: The inferior vena cava is dilated in size with greater than 50% respiratory variability, suggesting right atrial pressure of 8 mmHg. IAS/Shunts: No atrial level shunt detected by color flow Doppler.  LEFT VENTRICLE PLAX 2D LVOT diam:     2.30 cm LV SV:         73 LV SV Index:   28 LVOT Area:     4.15 cm  IVC IVC diam: 2.40 cm AORTIC VALVE                     PULMONIC VALVE AV Area (Vmax):    2.77 cm      PV Vmax:       1.15 m/s AV Area (Vmean):   2.64 cm      PV Peak grad:  5.3 mmHg AV Area (VTI):     2.81 cm AV Vmax:           202.67 cm/s AV Vmean:          132.667 cm/s AV VTI:            0.258 m AV Peak Grad:      16.4 mmHg AV Mean Grad:      11.0 mmHg LVOT Vmax:         135.00 cm/s LVOT Vmean:        84.300 cm/s LVOT VTI:          0.175 m LVOT/AV VTI ratio: 0.68  AORTA Ao Root diam: 3.60 cm Ao Asc diam:  4.40 cm  SHUNTS Systemic VTI:  0.18 m Systemic Diam: 2.30 cm Riley Lam MD Electronically signed by Riley Lam MD Signature Date/Time: 05/18/2023/1:24:31 PM  Final    DG Chest Portable 1 View  Result Date: 05/17/2023 CLINICAL DATA:  Atrial fibrillation. EXAM: PORTABLE CHEST 1 VIEW COMPARISON:  May 27, 2022 FINDINGS: Multiple sternal wires and vascular clips are noted. The cardiac silhouette is mildly enlarged and unchanged in size. Both lungs are clear.  The visualized skeletal structures are unremarkable. IMPRESSION: 1. Evidence of prior median sternotomy/CABG. 2. No active disease. Electronically Signed   By: Aram Candela M.D.   On: 05/17/2023 18:04    Scheduled Meds:  [MAR Hold] apixaban  5 mg Oral BID   [MAR Hold] atorvastatin  40 mg Oral Daily   [MAR Hold] diltiazem  360 mg Oral Daily   [MAR Hold] insulin aspart  0-9 Units Subcutaneous TID WC   [MAR Hold] irbesartan  150 mg Oral Daily   [MAR Hold] polyethylene glycol  17 g Oral Daily   [MAR Hold] senna  1 tablet Oral BID   Continuous Infusions:  sodium chloride 20 mL/hr at 05/19/23 1117   [MAR Hold] diltiazem (CARDIZEM) infusion Stopped (05/19/23 1259)     LOS: 1 day   Burnadette Pop, MD [facility] P8/28/2024, 1:34 PM

## 2023-05-19 NOTE — Progress Notes (Signed)
  Echocardiogram Echocardiogram Transesophageal has been performed.  Delcie Roch 05/19/2023, 1:14 PM

## 2023-05-19 NOTE — Anesthesia Postprocedure Evaluation (Signed)
Anesthesia Post Note  Patient: David Frost  Procedure(s) Performed: TRANSESOPHAGEAL ECHOCARDIOGRAM CARDIOVERSION     Patient location during evaluation: Cath Lab Anesthesia Type: MAC and General Level of consciousness: awake Pain management: pain level controlled Vital Signs Assessment: post-procedure vital signs reviewed and stable Respiratory status: spontaneous breathing Cardiovascular status: stable Postop Assessment: no apparent nausea or vomiting Anesthetic complications: no  There were no known notable events for this encounter.  Last Vitals:  Vitals:   05/19/23 1348 05/19/23 1546  BP: 128/83 (!) 133/99  Pulse:  79  Resp: 18 20  Temp:  36.7 C  SpO2: 94% 97%    Last Pain:  Vitals:   05/19/23 1546  TempSrc: Oral  PainSc:                  Caren Macadam

## 2023-05-19 NOTE — CV Procedure (Signed)
   TRANSESOPHAGEAL ECHOCARDIOGRAM GUIDED DIRECT CURRENT CARDIOVERSION  NAME:  David Frost   MRN: [id number] DOB:  [birth year]   ADMIT DATE: 05/17/2023  INDICATIONS: Symptomatic atrial flutter  PROCEDURE:   Informed consent was obtained prior to the procedure. The risks, benefits and alternatives for the procedure were discussed and the patient comprehended these risks.  Risks include, but are not limited to, cough, sore throat, vomiting, nausea, somnolence, esophageal and stomach trauma or perforation, bleeding, low blood pressure, aspiration, pneumonia, infection, trauma to the teeth and death.    After a procedural time-out, the patient was sedated by the anesthesia service. The transesophageal probe was inserted in the esophagus and stomach without difficulty and multiple views were obtained. Anesthesia was monitored by Dr. Arby Barrette. Patient received 100 mg IV lidocaine and 316 mg IV propofol.  COMPLICATIONS:    Complications: No complications Patient tolerated procedure well.  FINDINGS:  LEFT VENTRICLE: EF = 60-65%. No regional wall motion abnormalities.  RIGHT VENTRICLE: Normal size and function.   LEFT ATRIUM: No thrombus/mass.  LEFT ATRIAL APPENDAGE: No thrombus/mass.   RIGHT ATRIUM: No thrombus/mass.  AORTIC VALVE:  Trileaflet. Trivial regurgitation. No vegetation.  MITRAL VALVE:    Normal structure. Trivial regurgitation. No vegetation.  TRICUSPID VALVE: Normal structure. Trivial regurgitation. No vegetation.  PULMONIC VALVE: Grossly normal structure. Trivial regurgitation. No apparent vegetation.  INTERATRIAL SEPTUM: No PFO or ASD seen by color Doppler.  PERICARDIUM: No effusion noted.  DESCENDING AORTA: Moderate diffuse plaque seen  ASCENDING AORTA: mildly dilated   CARDIOVERSION:     Indications:  Symptomatic Atrial Flutter  Procedure Details:  Once the TEE was complete, the patient had the defibrillator pads placed in the anterior and posterior  position. Once an appropriate level of sedation was confirmed, the patient was cardioverted x 1 with 150J of biphasic synchronized energy.  The patient converted to NSR.  There were no apparent complications.  The patient had normal neuro status and respiratory status post procedure with vitals stable as recorded elsewhere.  Adequate airway was maintained throughout and vital signs monitored per protocol.  Jodelle Red, MD, PhD, [facility] [place]  [facility]  [place]  [facility] at [facility] [address] [place], [place] [address] [phone]   1:02 PM

## 2023-05-19 NOTE — Transfer of Care (Signed)
Immediate Anesthesia Transfer of Care Note  Patient: David Frost  Procedure(s) Performed: TRANSESOPHAGEAL ECHOCARDIOGRAM CARDIOVERSION  Patient Location: PACU  Anesthesia Type:MAC  Level of Consciousness: awake, alert , and oriented  Airway & Oxygen Therapy: Patient connected to nasal cannula oxygen  Post-op Assessment: Report given to RN and Post -op Vital signs reviewed and stable  Post vital signs: Reviewed and stable  Last Vitals:  Vitals Value Taken Time  BP    Temp 36.8 C 05/19/23 1311  Pulse 74 05/19/23 1313  Resp 24 05/19/23 1313  SpO2 95 % 05/19/23 1313  Vitals shown include unfiled device data.  Last Pain:  Vitals:   05/19/23 1311  TempSrc: Temporal  PainSc: 0-No pain         Complications: There were no known notable events for this encounter.

## 2023-05-19 NOTE — Progress Notes (Signed)
Rounding Note    Patient Name: David Frost Date of Encounter: 05/19/2023  [facility] Cardiologist: Nanetta Batty, MD   Subjective   Anxious  Deneis CP  Breathing is ok   Inpatient Medications    Scheduled Meds:  apixaban  5 mg Oral BID   atorvastatin  40 mg Oral Daily   diltiazem  360 mg Oral Daily   insulin aspart  0-9 Units Subcutaneous TID WC   irbesartan  150 mg Oral Daily   polyethylene glycol  17 g Oral Daily   senna  1 tablet Oral BID   Continuous Infusions:  diltiazem (CARDIZEM) infusion 15 mg/hr (05/19/23 0700)   PRN Meds: acetaminophen **OR** acetaminophen, ALPRAZolam, melatonin, ondansetron (ZOFRAN) IV   Vital Signs    Vitals:   05/19/23 0551 05/19/23 0605 05/19/23 0700 05/19/23 0900  BP:  118/86 124/83   Pulse:      Resp:  18 19   Temp:    98.4 F (36.9 C)  TempSrc:    Oral  SpO2:      Weight: (!) 146.3 kg     Height:        Intake/Output Summary (Last 24 hours) at 05/19/2023 0959 Last data filed at 05/19/2023 0700 Gross per 24 hour  Intake 432.6 ml  Output --  Net 432.6 ml      05/19/2023    5:51 AM 05/18/2023    5:53 AM 05/17/2023    4:37 PM  Last 3 Weights  Weight (lbs) 322 lb 8 oz 326 lb 300 lb  Weight (kg) 146.285 kg 147.873 kg 136.079 kg      Telemetry    Atrial flutter with RVR  130s  - Personally Reviewed  ECG   No new  - Personally Reviewed  Physical Exam   GEN: Morbidly obese 72 yo.   Neck: Full Cardiac: Irreg irreg    Respiratory: Clear to auscultation  GI: Soft  Obese  MS: 1+ edema; Weeping ankles  Labs    High Sensitivity Troponin:   Recent Labs  Lab 05/17/23 1834 05/17/23 2023  TROPONINIHS 17 18*     Chemistry Recent Labs  Lab 05/17/23 1731 05/18/23 0653 05/19/23 0435  NA 132* 134* 133*  K 4.8 3.9 4.3  CL 97* 100 100  CO2 26 24 24   GLUCOSE 141* 151* 156*  BUN 56* 43* 30*  CREATININE 1.11 1.21 1.01  CALCIUM 9.1 8.9 8.8*  MG 2.2 2.1  --   PROT  --  6.3*  --   ALBUMIN  --  3.3*   --   AST  --  18  --   ALT  --  48*  --   ALKPHOS  --  56  --   BILITOT  --  0.7  --   GFRNONAA >60 >60 >60  ANIONGAP 9 10 9     Lipids No results for input(s): "CHOL", "TRIG", "HDL", "LABVLDL", "LDLCALC", "CHOLHDL" in the last 168 hours.  Hematology Recent Labs  Lab 05/17/23 1731 05/18/23 0653 05/19/23 0435  WBC 12.7* 14.0* 13.2*  RBC 4.69 4.73 4.57  HGB 14.6 14.5 13.9  HCT 42.3 43.0 41.9  MCV 90.2 90.9 91.7  MCH 31.1 30.7 30.4  MCHC 34.5 33.7 33.2  RDW 13.9 14.0 14.1  PLT 185 186 165   Thyroid  Recent Labs  Lab 05/17/23 1731  TSH 2.212    BNP Recent Labs  Lab 05/17/23 1731  BNP 500.6*    DDimer No results for input(s): "  DDIMER" in the last 168 hours.   Radiology    ECHOCARDIOGRAM COMPLETE  Result Date: 05/18/2023    ECHOCARDIOGRAM REPORT   Patient Name:   David Frost Date of Exam: 05/18/2023 Medical Rec #:  [id number]       Height:       72.0 in Accession #:    [id number]      Weight:       326.0 lb Date of Birth:  05-02-1951       BSA:          2.622 m Patient Age:    72 years        BP:           101/86 mmHg Patient Gender: M               HR:           95 bpm. Exam Location:  Inpatient Procedure: 2D Echo, Cardiac Doppler and Color Doppler Indications:    Atrial Flutter I48.92  History:        Patient has prior history of Echocardiogram examinations, most                 recent 03/22/2017. Previous Myocardial Infarction, Prior CABG,                 Arrythmias:Atrial Flutter, Atrial Fibrillation and RBBB; Risk                 Factors:Dyslipidemia, Hypertension and Former Smoker.  Sonographer:    Dondra Prader RVT RCS Referring Phys: [id number] Angie Fava  Sonographer Comments: Technically difficult study due to poor echo windows, suboptimal subcostal window, suboptimal apical window, suboptimal parasternal window and patient is obese. Image acquisition challenging due to patient body habitus and Unable to  lie still for exam. Unable to get accurate measurements due to  elevated HR and A-fib. 134 BPM IMPRESSIONS  1. Left ventricular ejection fraction, by estimation, is 65 to 70%. The left ventricle has normal function. Left ventricular endocardial border not optimally defined to evaluate regional wall motion. Left ventricular diastolic function could not be evaluated.  2. Right ventricular systolic function was not well visualized. The right ventricular size is not well visualized.  3. Left atrial size was mildly dilated.  4. Right atrial size was mildly dilated.  5. The mitral valve is abnormal. No evidence of mitral valve regurgitation. No evidence of mitral stenosis.  6. The aortic valve was not well visualized. Aortic valve regurgitation is not visualized.  7. Aortic dilatation noted. 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  8. The inferior vena cava is dilated in size with >50% respiratory variability, suggesting right atrial pressure of 8 mmHg. Comparison(s): Similar to 2018 study. FINDINGS  Left Ventricle: Left ventricular ejection fraction, by estimation, is 65 to 70%. The left ventricle has normal function. Left ventricular endocardial border not optimally defined to evaluate regional wall motion. The left ventricular internal cavity size was small. Suboptimal image quality limits for assessment of left ventricular hypertrophy. Left ventricular diastolic function could not be evaluated due to atrial fibrillation. Left ventricular diastolic function could not be evaluated. Right Ventricle: The right ventricular size is not well visualized. No increase in right ventricular wall thickness. Right ventricular systolic function was not well visualized. Left Atrium: Left atrial size was mildly dilated. Right Atrium: Right atrial size was mildly dilated. Pericardium: Trivial pericardial effusion is present. The pericardial effusion is surrounding the apex.  Mitral Valve: The mitral valve is abnormal. No evidence of mitral valve regurgitation. No evidence  of mitral valve stenosis. Tricuspid Valve: The tricuspid valve is not well visualized. Tricuspid valve regurgitation is not demonstrated. No evidence of tricuspid stenosis. Aortic Valve: The aortic valve was not well visualized. Aortic valve regurgitation is not visualized. Aortic valve mean gradient measures 11.0 mmHg. Aortic valve peak gradient measures 16.4 mmHg. Aortic valve area, by VTI measures 2.81 cm. Pulmonic Valve: The pulmonic valve was not well visualized. Pulmonic valve regurgitation is not visualized. No evidence of pulmonic stenosis. Aorta: Aortic dilatation noted. 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 Venous: The inferior vena cava is dilated in size with greater than 50% respiratory variability, suggesting right atrial pressure of 8 mmHg. IAS/Shunts: No atrial level shunt detected by color flow Doppler.  LEFT VENTRICLE PLAX 2D LVOT diam:     2.30 cm LV SV:         73 LV SV Index:   28 LVOT Area:     4.15 cm  IVC IVC diam: 2.40 cm AORTIC VALVE                     PULMONIC VALVE AV Area (Vmax):    2.77 cm      PV Vmax:       1.15 m/s AV Area (Vmean):   2.64 cm      PV Peak grad:  5.3 mmHg AV Area (VTI):     2.81 cm AV Vmax:           202.67 cm/s AV Vmean:          132.667 cm/s AV VTI:            0.258 m AV Peak Grad:      16.4 mmHg AV Mean Grad:      11.0 mmHg LVOT Vmax:         135.00 cm/s LVOT Vmean:        84.300 cm/s LVOT VTI:          0.175 m LVOT/AV VTI ratio: 0.68  AORTA Ao Root diam: 3.60 cm Ao Asc diam:  4.40 cm  SHUNTS Systemic VTI:  0.18 m Systemic Diam: 2.30 cm Riley Lam MD Electronically signed by Riley Lam MD Signature Date/Time: 05/18/2023/1:24:31 PM    Final    DG Chest Portable 1 View  Result Date: 05/17/2023 CLINICAL DATA:  Atrial fibrillation. EXAM: PORTABLE CHEST 1 VIEW COMPARISON:  May 27, 2022 FINDINGS: Multiple sternal wires and vascular clips are noted. The cardiac silhouette is mildly enlarged and unchanged in  size. Both lungs are clear. The visualized skeletal structures are unremarkable. IMPRESSION: 1. Evidence of prior median sternotomy/CABG. 2. No active disease. Electronically Signed   By: Aram Candela M.D.   On: 05/17/2023 18:04    Cardiac Studies   Echo 05/18/23: 1. Left ventricular ejection fraction, by estimation, is 65 to 70%. The  left ventricle has normal function. Left ventricular endocardial border  not optimally defined to evaluate regional wall motion. Left ventricular  diastolic function could not be  evaluated.   2. Right ventricular systolic function was not well visualized. The right  ventricular size is not well visualized.   3. Left atrial size was mildly dilated.   4. Right atrial size was mildly dilated.   5. The mitral valve is abnormal. No evidence of mitral valve  regurgitation. No evidence of mitral stenosis.   6. The aortic  valve was not well visualized. Aortic valve regurgitation  is not visualized.   7. Aortic dilatation noted. There is mild dilatation of the ascending  aorta, measuring 44 mm.   8. The inferior vena cava is dilated in size with >50% respiratory  variability, suggesting right atrial pressure of 8 mmHg.     Patient Profile     David Frost is a 72 y.o. 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 Renford Dills.    Assessment & Plan   1  Atrial flutter   Pt's HR increased last night    Given this will plan for TEE and possible cardioversion today   Pt switched to Eliquis    On IV dilt now   2.  Hx CAD NSTEMI and then  CABG x 6 in 2018  Denies CP     3  HL   On lipitor  5  HFpEF  Pt with volume overload with edema   Cardiovert  Attempt diuresis after      Jardiance held for now    6  HTN  BP controlled   7  OSA  Has CPAP at home   8  Morbid obesity  REviewed diet   Was on mounjaro  (last dose last Wednesday)  Work on diet  Cut out wine  For questions or updates, please contact [facility] Please consult [email or web address] for contact info under        Signed, Dietrich Pates, MD  05/19/2023, 9:59 AM

## 2023-05-20 ENCOUNTER — Encounter (HOSPITAL_COMMUNITY): Payer: Self-pay | Admitting: Cardiology

## 2023-05-20 DIAGNOSIS — I4892 Unspecified atrial flutter: Secondary | ICD-10-CM | POA: Diagnosis not present

## 2023-05-20 LAB — GLUCOSE, CAPILLARY
Glucose-Capillary: 189 mg/dL — ABNORMAL HIGH (ref 70–99)
Glucose-Capillary: 130 mg/dL — ABNORMAL HIGH (ref 70–99)
Glucose-Capillary: 156 mg/dL — ABNORMAL HIGH (ref 70–99)

## 2023-05-20 LAB — BASIC METABOLIC PANEL
Anion gap: 13 (ref 5–15)
BUN: 29 mg/dL — ABNORMAL HIGH (ref 8–23)
CO2: 21 mmol/L — ABNORMAL LOW (ref 22–32)
Calcium: 8.9 mg/dL (ref 8.9–10.3)
Chloride: 97 mmol/L — ABNORMAL LOW (ref 98–111)
Creatinine, Ser: 1 mg/dL (ref 0.61–1.24)
GFR, Estimated: >60 mL/min (ref >60)
Glucose, Bld: 127 mg/dL — ABNORMAL HIGH (ref 70–99)
Potassium: 4.8 mmol/L (ref 3.5–5.1)
Sodium: 131 mmol/L — ABNORMAL LOW (ref 135–145)

## 2023-05-20 LAB — ECHO TEE

## 2023-05-20 LAB — MAGNESIUM: Magnesium: 2 mg/dL (ref 1.7–2.4)

## 2023-05-20 MED ORDER — DILTIAZEM HCL ER COATED BEADS 360 MG PO CP24: 360.0000 mg | ORAL_CAPSULE | Freq: Every day | ORAL | 1 refills | Status: DC

## 2023-05-20 MED ORDER — APIXABAN 5 MG PO TABS: 5.0000 mg | ORAL_TABLET | Freq: Two times a day (BID) | ORAL | 1 refills | Status: DC

## 2023-05-20 MED ORDER — FUROSEMIDE 10 MG/ML IJ SOLN
80.0000 mg | Freq: Once | INTRAMUSCULAR | Status: AC
  Administered 2023-05-20: 80 mg via INTRAVENOUS
  Filled 2023-05-20: qty 8

## 2023-05-20 MED ORDER — EMPAGLIFLOZIN 25 MG PO TABS
25.0000 mg | ORAL_TABLET | Freq: Every day | ORAL | Status: DC
  Administered 2023-05-20: 25 mg via ORAL
  Filled 2023-05-20: qty 1

## 2023-05-20 MED ORDER — OLMESARTAN MEDOXOMIL 40 MG PO TABS: 40.0000 mg | ORAL_TABLET | Freq: Every day | ORAL | 1 refills | Status: AC

## 2023-05-20 MED ORDER — POTASSIUM CHLORIDE CRYS ER 10 MEQ PO TBCR
10.0000 meq | EXTENDED_RELEASE_TABLET | Freq: Once | ORAL | Status: AC
  Administered 2023-05-20: 10 meq via ORAL
  Filled 2023-05-20: qty 1

## 2023-05-20 MED ORDER — FUROSEMIDE 40 MG PO TABS: 40.0000 mg | ORAL_TABLET | Freq: Two times a day (BID) | ORAL | 0 refills | Status: DC

## 2023-05-20 MED ORDER — POTASSIUM CHLORIDE CRYS ER 20 MEQ PO TBCR: 20.0000 meq | EXTENDED_RELEASE_TABLET | Freq: Every day | ORAL | 0 refills | Status: DC

## 2023-05-20 NOTE — Discharge Summary (Signed)
Physician Discharge Summary  David Frost ZDG:[id number] DOB: Sep 29, 1950 DOA: 05/17/2023  PCP: Garlan Fillers, MD  Admit date: 05/17/2023 Discharge date: 05/21/2023  Admitted From: Home Disposition:  Home  Discharge Condition:Stable CODE STATUS:FULL Diet recommendation: Heart Healthy  Brief/Interim Summary:  Patient is a 72 male with history of diabetes type 2, hypertension, hyperlipidemia, coronary artery disease status post CABG, paroxysmal A-fib not on anticoagulation status post cardioversion in July 2018, sleep apnea on CPAP, GAD who initially presented with shortness of breath. Found to be in rapid A flutter on presentation. TSH normal. EKG showed atrial flutter with 2 is to 1 AV block, right bundle branch block. Patient was admitted for the management of A-fib with RVR. Cardiology consulted. Started  Cardizem drip, heparin drip.  Status post cardioversion on 8/29.  Currently cleared for discharge.   Following problems were addressed during the hospitalization:  A-fib with RVR: History of paroxysmal A-fib status post electrocardioversion in July 2018.  Presented with rapid A flutter.  TSH is normal.  Cardiology consulted.Started  heparin drip, Cardizem drip. TTE showed EF of 65 to 70%.  Done cardioversion on 8/29.  Started on Eliquis, Cardizem   Hyponatremia: Sodium of 132 on presentation.Home HCTZ on hold.     Leukocytosis:   Likely reactive.  Stable  Diabetes type 2: Recent A1c of 6.6.  Takes Ozempic.  Continue sliding scale.  Monitor blood sugars at home   Hypertension: Takes olmesartan, HCTZ, metoprolol at home.  Metoprolol changed to Cardizem.  Hydrochlorothiazide on hold   Hyperlipidemia: On Lipitor, Vascepa   GAD: Takes Xanax at home.   Allergic rhinitis:Takes Claritin at home   Sleep apnea: On CPAP   Morbid obesity: BMI 44.1   Bilateral lower extremity edema/superficial ulcers: Ulcers are most likely precipitated due to persistent leg edema/venous  insufficiency.  He is using Radio broadcast assistant at home.  Started on Lasix 40 mg twice daily.  Echo did not comment on diastolic function.  We recommend  to follow-up with vein clinic as outpatient   Discharge Diagnoses:  Principal Problem:   Atrial flutter with rapid ventricular response (HCC) Active Problems:   Essential hypertension   DM2 (diabetes mellitus, type 2) (HCC)   Hyperlipidemia   Acute hyponatremia   Acute prerenal azotemia   SIRS (systemic inflammatory response syndrome) (HCC)   GAD (generalized anxiety disorder)   Allergic rhinitis    Discharge Instructions  Discharge Instructions     Diet - low sodium heart healthy   Complete by: As directed    Discharge instructions   Complete by: As directed    1)Please take prescribed medications as instructed 2)Follow up with your PCP in a week. Do a BMP test during the follow up 3)You will be called by cardiology for outpatient follow up   Increase activity slowly   Complete by: As directed       Allergies as of 05/20/2023       Reactions   Zolpidem    Other Reaction(s): Unknown Other Reaction(s): Dysphoria        Medication List     STOP taking these medications    metoprolol succinate 100 MG 24 hr tablet Commonly known as: TOPROL-XL   olmesartan-hydrochlorothiazide 40-12.5 MG tablet Commonly known as: BENICAR HCT       TAKE these medications    acetaminophen 325 MG tablet Commonly known as: TYLENOL Take 2 tablets (650 mg total) by mouth every 6 (six) hours as needed for mild pain.   albuterol 108 (  90 Base) MCG/ACT inhaler Commonly known as: VENTOLIN HFA Inhale 2 puffs into the lungs every 6 (six) hours as needed for wheezing or shortness of breath.   ALPRAZolam 0.25 MG tablet Commonly known as: XANAX Take 1 tablet (0.25 mg total) by mouth 2 (two) times daily as needed for anxiety.   apixaban 5 MG Tabs tablet Commonly known as: ELIQUIS Take 1 tablet (5 mg total) by mouth 2 (two) times daily.    aspirin EC 81 MG tablet Take 1 tablet (81 mg total) by mouth daily.   atorvastatin 40 MG tablet Commonly known as: LIPITOR TAKE 1 TABLET(40 MG) BY MOUTH DAILY   Cholecalciferol 1.25 MG (50000 UT) capsule Take 50,000 Units by mouth once a week.   diltiazem 360 MG 24 hr capsule Commonly known as: CARDIZEM CD Take 1 capsule (360 mg total) by mouth daily.   fluticasone 50 MCG/ACT nasal spray Commonly known as: FLONASE Place 1 spray into both nostrils daily.   furosemide 40 MG tablet Commonly known as: Lasix Take 1 tablet (40 mg total) by mouth 2 (two) times daily.   icosapent Ethyl 1 g capsule Commonly known as: VASCEPA TAKE 2 CAPSULES(2 GRAMS) BY MOUTH TWICE DAILY   Jardiance 25 MG Tabs tablet Generic drug: empagliflozin Take 25 mg by mouth daily.   loratadine 10 MG tablet Commonly known as: CLARITIN Take 10 mg by mouth daily.   Lumigan 0.01 % Soln Generic drug: bimatoprost Lumigan 0.01 % eye drops  INSTILL 1 DROP INTO LEFT EYE EVERY NIGHT AT BEDTIME   metFORMIN 500 MG 24 hr tablet Commonly known as: GLUCOPHAGE-XR Take 500 mg by mouth every evening. With meal.   Mounjaro 2.5 MG/0.5ML Pen Generic drug: tirzepatide Inject 2.5 mg into the skin once a week.   mupirocin ointment 2 % Commonly known as: BACTROBAN Apply 1 Application topically 3 (three) times daily. For 7 days.   nitroGLYCERIN 0.4 MG SL tablet Commonly known as: NITROSTAT PLACE 1 TABLET UNDER THE TONGUE EVERY 5 MINUTES AS NEEDED FOR CHEST PAIN   olmesartan 40 MG tablet Commonly known as: BENICAR Take 1 tablet (40 mg total) by mouth daily.   Ozempic (0.25 or 0.5 MG/DOSE) 2 MG/3ML Sopn Generic drug: Semaglutide(0.25 or 0.5MG /DOS) INJECT 0.5 MG UNDER THE SKIN ONCE A WEEK   potassium chloride SA 20 MEQ tablet Commonly known as: KLOR-CON M Take 1 tablet (20 mEq total) by mouth daily.   PRESERVISION AREDS 2 PO Take 1 capsule by mouth 2 (two) times daily.   Symbicort 160-4.5 MCG/ACT  inhaler Generic drug: budesonide-formoterol Inhale 2 puffs into the lungs in the morning and at bedtime. Not to be taken more frequently than twice daily.   triamcinolone cream 0.1 % Commonly known as: KENALOG Apply 1 Application topically 2 (two) times daily as needed (Redness).        Allergies  Allergen Reactions   Zolpidem     Other Reaction(s): Unknown  Other Reaction(s): Dysphoria    Consultations: Cardiology   Procedures/Studies: ECHO TEE  Result Date: 05/20/2023    TRANSESOPHOGEAL ECHO REPORT   Patient Name:   David Frost Date of Exam: 05/19/2023 Medical Rec #:  [id number]       Height:       72.0 in Accession #:    [id number]      Weight:       322.5 lb Date of Birth:  [birth year]       BSA:  2.610 m Patient Age:    72 years        BP:           138/96 mmHg Patient Gender: M               HR:           108 bpm. Exam Location:  Inpatient Procedure: Transesophageal Echo, Color Doppler and Cardiac Doppler Indications:     Atrial flutter  History:         Patient has prior history of Echocardiogram examinations, most                  recent 05/18/2023. CAD, Prior CABG, Arrythmias:RBBB, Atrial                  Flutter and Atrial Fibrillation; Risk Factors:Hypertension,                  Dyslipidemia, Diabetes and Sleep Apnea.  Sonographer:     Delcie Roch RDCS Referring Phys:  [id number] BRIDGETTE CHRISTOPHER Diagnosing Phys: Jodelle Red MD PROCEDURE: After discussion of the risks and benefits of a TEE, an informed consent was obtained from the patient. The transesophogeal probe was passed without difficulty through the esophogus of the patient. Imaged were obtained with the patient in a left lateral decubitus position. Sedation performed by different physician. The patient was monitored while under deep sedation. Anesthestetic sedation was provided intravenously by Anesthesiology: 316mg  of Propofol, 100mg  of Lidocaine. The patient's vital signs; including heart  rate, blood pressure, and oxygen saturation; remained stable throughout the procedure. The patient developed no complications during the procedure. A successful direct current cardioversion was performed at 150 joules with 1 attempt.  IMPRESSIONS  1. Left ventricular ejection fraction, by estimation, is 60 to 65%. The left ventricle has normal function.  2. Right ventricular systolic function is normal. The right ventricular size is normal.  3. Left atrial size was mildly dilated. No left atrial/left atrial appendage thrombus was detected.  4. Right atrial size was mildly dilated.  5. The mitral valve is normal in structure. Trivial mitral valve regurgitation. No evidence of mitral stenosis.  6. The aortic valve is tricuspid. Aortic valve regurgitation is trivial. No aortic stenosis is present.  7. Aortic dilatation noted. There is mild dilatation of the ascending aorta, measuring 40 mm. There is Moderate (Grade III) plaque involving the descending aorta. Conclusion(s)/Recommendation(s): No LA/LAA thrombus identified. Successful cardioversion performed with restoration of normal sinus rhythm. FINDINGS  Left Ventricle: Left ventricular ejection fraction, by estimation, is 60 to 65%. The left ventricle has normal function. The left ventricular internal cavity size was normal in size. Right Ventricle: The right ventricular size is normal. No increase in right ventricular wall thickness. Right ventricular systolic function is normal. Left Atrium: Left atrial size was mildly dilated. No left atrial/left atrial appendage thrombus was detected. Right Atrium: Right atrial size was mildly dilated. Pericardium: There is no evidence of pericardial effusion. Mitral Valve: The mitral valve is normal in structure. Trivial mitral valve regurgitation. No evidence of mitral valve stenosis. There is no evidence of mitral valve vegetation. Tricuspid Valve: The tricuspid valve is normal in structure. Tricuspid valve regurgitation is  trivial. No evidence of tricuspid stenosis. There is no evidence of tricuspid valve vegetation. Aortic Valve: The aortic valve is tricuspid. Aortic valve regurgitation is trivial. No aortic stenosis is present. There is no evidence of aortic valve vegetation. Pulmonic Valve: The pulmonic valve was grossly normal. Pulmonic valve  regurgitation is trivial. No evidence of pulmonic stenosis. There is no evidence of pulmonic valve vegetation. Aorta: Aortic dilatation noted. There is mild dilatation of the ascending aorta, measuring 40 mm. There is moderate (Grade III) plaque involving the descending aorta. IAS/Shunts: The interatrial septum appears to be lipomatous. No atrial level shunt detected by color flow Doppler. Additional Comments: Spectral Doppler performed. Jodelle Red MD Electronically signed by Jodelle Red MD Signature Date/Time: 05/20/2023/1:30:02 PM    Final    EP STUDY  Result Date: 05/19/2023 See surgical note for result.  ECHOCARDIOGRAM COMPLETE  Result Date: 05/18/2023    ECHOCARDIOGRAM REPORT   Patient Name:   David Frost Date of Exam: 05/18/2023 Medical Rec #:  [id number]       Height:       72.0 in Accession #:    [id number]      Weight:       326.0 lb Date of Birth:  [birth year]       BSA:          2.622 m Patient Age:    72 years        BP:           101/86 mmHg Patient Gender: M               HR:           95 bpm. Exam Location:  Inpatient Procedure: 2D Echo, Cardiac Doppler and Color Doppler Indications:    Atrial Flutter I48.92  History:        Patient has prior history of Echocardiogram examinations, most                 recent 03/22/2017. Previous Myocardial Infarction, Prior CABG,                 Arrythmias:Atrial Flutter, Atrial Fibrillation and RBBB; Risk                 Factors:Dyslipidemia, Hypertension and Former Smoker.  Sonographer:    Dondra Prader RVT RCS Referring Phys: [id number] Angie Fava  Sonographer Comments: Technically difficult study due to poor  echo windows, suboptimal subcostal window, suboptimal apical window, suboptimal parasternal window and patient is obese. Image acquisition challenging due to patient body habitus and Unable to  lie still for exam. Unable to get accurate measurements due to elevated HR and A-fib. 134 BPM IMPRESSIONS  1. Left ventricular ejection fraction, by estimation, is 65 to 70%. The left ventricle has normal function. Left ventricular endocardial border not optimally defined to evaluate regional wall motion. Left ventricular diastolic function could not be evaluated.  2. Right ventricular systolic function was not well visualized. The right ventricular size is not well visualized.  3. Left atrial size was mildly dilated.  4. Right atrial size was mildly dilated.  5. The mitral valve is abnormal. No evidence of mitral valve regurgitation. No evidence of mitral stenosis.  6. The aortic valve was not well visualized. Aortic valve regurgitation is not visualized.  7. Aortic dilatation noted. 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  8. The inferior vena cava is dilated in size with >50% respiratory variability, suggesting right atrial pressure of 8 mmHg. Comparison(s): Similar to 2018 study. FINDINGS  Left Ventricle: Left ventricular ejection fraction, by estimation, is 65 to 70%. The left ventricle has normal function. Left ventricular endocardial border not optimally defined to evaluate regional wall motion. The left ventricular internal cavity size was small. Suboptimal  image quality limits for assessment of left ventricular hypertrophy. Left ventricular diastolic function could not be evaluated due to atrial fibrillation. Left ventricular diastolic function could not be evaluated. Right Ventricle: The right ventricular size is not well visualized. No increase in right ventricular wall thickness. Right ventricular systolic function was not well visualized. Left Atrium: Left atrial size was mildly dilated.  Right Atrium: Right atrial size was mildly dilated. Pericardium: Trivial pericardial effusion is present. The pericardial effusion is surrounding the apex. Mitral Valve: The mitral valve is abnormal. No evidence of mitral valve regurgitation. No evidence of mitral valve stenosis. Tricuspid Valve: The tricuspid valve is not well visualized. Tricuspid valve regurgitation is not demonstrated. No evidence of tricuspid stenosis. Aortic Valve: The aortic valve was not well visualized. Aortic valve regurgitation is not visualized. Aortic valve mean gradient measures 11.0 mmHg. Aortic valve peak gradient measures 16.4 mmHg. Aortic valve area, by VTI measures 2.81 cm. Pulmonic Valve: The pulmonic valve was not well visualized. Pulmonic valve regurgitation is not visualized. No evidence of pulmonic stenosis. Aorta: Aortic dilatation noted. 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 Venous: The inferior vena cava is dilated in size with greater than 50% respiratory variability, suggesting right atrial pressure of 8 mmHg. IAS/Shunts: No atrial level shunt detected by color flow Doppler.  LEFT VENTRICLE PLAX 2D LVOT diam:     2.30 cm LV SV:         73 LV SV Index:   28 LVOT Area:     4.15 cm  IVC IVC diam: 2.40 cm AORTIC VALVE                     PULMONIC VALVE AV Area (Vmax):    2.77 cm      PV Vmax:       1.15 m/s AV Area (Vmean):   2.64 cm      PV Peak grad:  5.3 mmHg AV Area (VTI):     2.81 cm AV Vmax:           202.67 cm/s AV Vmean:          132.667 cm/s AV VTI:            0.258 m AV Peak Grad:      16.4 mmHg AV Mean Grad:      11.0 mmHg LVOT Vmax:         135.00 cm/s LVOT Vmean:        84.300 cm/s LVOT VTI:          0.175 m LVOT/AV VTI ratio: 0.68  AORTA Ao Root diam: 3.60 cm Ao Asc diam:  4.40 cm  SHUNTS Systemic VTI:  0.18 m Systemic Diam: 2.30 cm Riley Lam MD Electronically signed by Riley Lam MD Signature Date/Time: 05/18/2023/1:24:31 PM    Final    DG Chest Portable 1  View  Result Date: 05/17/2023 CLINICAL DATA:  Atrial fibrillation. EXAM: PORTABLE CHEST 1 VIEW COMPARISON:  May 27, 2022 FINDINGS: Multiple sternal wires and vascular clips are noted. The cardiac silhouette is mildly enlarged and unchanged in size. Both lungs are clear. The visualized skeletal structures are unremarkable. IMPRESSION: 1. Evidence of prior median sternotomy/CABG. 2. No active disease. Electronically Signed   By: Aram Candela M.D.   On: 05/17/2023 18:04      Subjective:  Patient seen and examined at bedside this morning.  Comfortable.  Underwent cardioversion.  Heart rate currently well-controlled.  Very eager to go  home. Discharge Exam: Vitals:   05/20/23 1548 05/20/23 2024  BP: (!) 142/64 (!) 147/84  Pulse: 92 (!) 32  Resp: 20 (!) 23  Temp: 98 F (36.7 C) 98.1 F (36.7 C)  SpO2: 92% 97%   Vitals:   05/20/23 0900 05/20/23 1159 05/20/23 1548 05/20/23 2024  BP: 114/64 120/64 (!) 142/64 (!) 147/84  Pulse: 81 82 92 (!) 32  Resp: 20 20 20  (!) 23  Temp: 98.3 F (36.8 C) 97.6 F (36.4 C) 98 F (36.7 C) 98.1 F (36.7 C)  TempSrc: Oral Oral Oral Oral  SpO2: 94% 94% 92% 97%  Weight:      Height:        General: Pt is alert, awake, not in acute distress Cardiovascular: RRR, S1/S2 +, no rubs, no gallops Respiratory: CTA bilaterally, no wheezing, no rhonchi Abdominal: Soft, NT, ND, bowel sounds + Extremities: Bilateral lower extremity pitting edema, no cyanosis    The results of significant diagnostics from this hospitalization (including imaging, microbiology, ancillary and laboratory) are listed below for reference.     Microbiology: No results found for this or any previous visit (from the past 240 hour(s)).   Labs: BNP (last 3 results) Recent Labs    05/17/23 1731  BNP 500.6*   Basic Metabolic Panel: Recent Labs  Lab 05/17/23 1731 05/18/23 0653 05/19/23 0435 05/20/23 1236  NA 132* 134* 133* 131*  K 4.8 3.9 4.3 4.8  CL 97* 100 100 97*   CO2 26 24 24  21*  GLUCOSE 141* 151* 156* 127*  BUN 56* 43* 30* 29*  CREATININE 1.11 1.21 1.01 1.00  CALCIUM 9.1 8.9 8.8* 8.9  MG 2.2 2.1  --  2.0   Liver Function Tests: Recent Labs  Lab 05/18/23 0653  AST 18  ALT 48*  ALKPHOS 56  BILITOT 0.7  PROT 6.3*  ALBUMIN 3.3*   No results for input(s): "LIPASE", "AMYLASE" in the last 168 hours. No results for input(s): "AMMONIA" in the last 168 hours. CBC: Recent Labs  Lab 05/17/23 1731 05/18/23 0653 05/19/23 0435  WBC 12.7* 14.0* 13.2*  HGB 14.6 14.5 13.9  HCT 42.3 43.0 41.9  MCV 90.2 90.9 91.7  PLT 185 186 165   Cardiac Enzymes: No results for input(s): "CKTOTAL", "CKMB", "CKMBINDEX", "TROPONINI" in the last 168 hours. BNP: Invalid input(s): "POCBNP" CBG: Recent Labs  Lab 05/19/23 1541 05/19/23 2115 05/20/23 0607 05/20/23 1236 05/20/23 1546  GLUCAP 246* 165* 189* 130* 156*   D-Dimer No results for input(s): "DDIMER" in the last 72 hours. Hgb A1c No results for input(s): "HGBA1C" in the last 72 hours. Lipid Profile No results for input(s): "CHOL", "HDL", "LDLCALC", "TRIG", "CHOLHDL", "LDLDIRECT" in the last 72 hours. Thyroid function studies No results for input(s): "TSH", "T4TOTAL", "T3FREE", "THYROIDAB" in the last 72 hours.  Invalid input(s): "FREET3" Anemia work up No results for input(s): "VITAMINB12", "FOLATE", "FERRITIN", "TIBC", "IRON", "RETICCTPCT" in the last 72 hours. Urinalysis    Component Value Date/Time   COLORURINE YELLOW 05/18/2023 0054   APPEARANCEUR CLEAR 05/18/2023 0054   LABSPEC 1.016 05/18/2023 0054   PHURINE 5.0 05/18/2023 0054   GLUCOSEU >=500 (A) 05/18/2023 0054   HGBUR SMALL (A) 05/18/2023 0054   BILIRUBINUR NEGATIVE 05/18/2023 0054   KETONESUR NEGATIVE 05/18/2023 0054   PROTEINUR NEGATIVE 05/18/2023 0054   NITRITE NEGATIVE 05/18/2023 0054   LEUKOCYTESUR NEGATIVE 05/18/2023 0054   Sepsis Labs Recent Labs  Lab 05/17/23 1731 05/18/23 0653 05/19/23 0435  WBC 12.7* 14.0*  13.2*   Microbiology  No results found for this or any previous visit (from the past 240 hour(s)).  Please note: You were cared for by a hospitalist during your hospital stay. Once you are discharged, your primary care physician will handle any further medical issues. Please note that NO REFILLS for any discharge medications will be authorized once you are discharged, as it is imperative that you return to your primary care physician (or establish a relationship with a primary care physician if you do not have one) for your post hospital discharge needs so that they can reassess your need for medications and monitor your lab values.    Time coordinating discharge: 40 minutes  SIGNED:   Burnadette Pop, MD  [facility] 05/21/2023, 1:35 PM Pager [phone]  If 7PM-7AM, please contact night-coverage [email or web address] Password [email or web address]

## 2023-05-20 NOTE — Progress Notes (Signed)
Mobility Specialist Progress Note:   05/20/23 1200  Mobility  Activity Ambulated with assistance in hallway  Level of Assistance Standby assist, set-up cues, supervision of patient - no hands on  Assistive Device Cane  Distance Ambulated (ft) 200 ft  Activity Response Tolerated well  Mobility Referral Yes  $Mobility charge 1 Mobility  Mobility Specialist Start Time (ACUTE ONLY) 1200  Mobility Specialist Stop Time (ACUTE ONLY) 1207  Mobility Specialist Time Calculation (min) (ACUTE ONLY) 7 min    Pre Mobility: 86 HR,  120/64 (81) BP During Mobility: 110 HR Post Mobility:  104 HR  Pt received in chair, agreeable to mobility. C/o bilateral knee pain, requiring x1 seated rest break. Otherwise asymptomatic. Pt left in chair with call bell and all needs met.  David Frost Mobility Specialist Please contact via Special educational needs teacher or Rehab office at [phone]

## 2023-05-20 NOTE — Progress Notes (Addendum)
Rounding Note    Patient Name: David Frost Date of Encounter: 05/20/2023  [facility] Cardiologist: Nanetta Batty, MD   Subjective   No acute overnight events. Maintaining sinus rhythm after cardioversion yesterday but having a lot of PVCs. He denies any palpitations but does have significant dyspnea on exertion. He also has lower extremity edema with weeping of his legs. No chest pain.  Inpatient Medications    Scheduled Meds:  apixaban  5 mg Oral BID   atorvastatin  40 mg Oral Daily   diltiazem  360 mg Oral Daily   insulin aspart  0-9 Units Subcutaneous TID WC   irbesartan  150 mg Oral Daily   polyethylene glycol  17 g Oral Daily   senna  1 tablet Oral BID   Continuous Infusions:  PRN Meds: acetaminophen **OR** acetaminophen, ALPRAZolam, melatonin, ondansetron (ZOFRAN) IV   Vital Signs    Vitals:   05/19/23 2327 05/20/23 0448 05/20/23 0452 05/20/23 0900  BP: 128/68 137/64  114/64  Pulse: 89 93  81  Resp: 16 19  20   Temp: 98 F (36.7 C) 98 F (36.7 C) 100 F (37.8 C) 98.3 F (36.8 C)  TempSrc: Oral Oral Oral Oral  SpO2: 96% 93%  94%  Weight:   (!) 146.6 kg   Height:        Intake/Output Summary (Last 24 hours) at 05/20/2023 0916 Last data filed at 05/20/2023 0500 Gross per 24 hour  Intake 874.61 ml  Output 1 ml  Net 873.61 ml      05/20/2023    4:52 AM 05/19/2023   11:10 AM 05/19/2023    5:51 AM  Last 3 Weights  Weight (lbs) 323 lb 3.2 oz 326 lb 322 lb 8 oz  Weight (kg) 146.603 kg 147.873 kg 146.285 kg      Telemetry    Normal sinus rhythm with frequent PVCs (sometimes bigeminy/ trigeminy) pattern. Rates in the 80s to 90s. - Personally Reviewed  ECG    No new ECG tracing today. - Personally Reviewed  Physical Exam   GEN: Morbidly obese male resting comfortably in no acute distress.   Neck: JVD difficult to assess due to body habitus. Cardiac: Irregular rhythm with normal rate. No murmurs, rubs, or gallops.  Respiratory: Mild  increased work of breathing and mild tachypnea. No wheezes, rhonchi, or rales. GI: Soft, obese, and non-tender. MS: 1+ pitting edema of bilateral lower extremities with weeping.  Skin: Hyperpigmentation of bilateral lower extremities consistent with chronic venous stasis/ insufficiency. Neuro:  No focal deficits. Psych: Normal affect. Responds appropriately.  Labs    High Sensitivity Troponin:   Recent Labs  Lab 05/17/23 1834 05/17/23 2023  TROPONINIHS 17 18*     Chemistry Recent Labs  Lab 05/17/23 1731 05/18/23 0653 05/19/23 0435  NA 132* 134* 133*  K 4.8 3.9 4.3  CL 97* 100 100  CO2 26 24 24   GLUCOSE 141* 151* 156*  BUN 56* 43* 30*  CREATININE 1.11 1.21 1.01  CALCIUM 9.1 8.9 8.8*  MG 2.2 2.1  --   PROT  --  6.3*  --   ALBUMIN  --  3.3*  --   AST  --  18  --   ALT  --  48*  --   ALKPHOS  --  56  --   BILITOT  --  0.7  --   GFRNONAA >60 >60 >60  ANIONGAP 9 10 9     Lipids No results for input(s): "CHOL", "  TRIG", "HDL", "LABVLDL", "LDLCALC", "CHOLHDL" in the last 168 hours.  Hematology Recent Labs  Lab 05/17/23 1731 05/18/23 0653 05/19/23 0435  WBC 12.7* 14.0* 13.2*  RBC 4.69 4.73 4.57  HGB 14.6 14.5 13.9  HCT 42.3 43.0 41.9  MCV 90.2 90.9 91.7  MCH 31.1 30.7 30.4  MCHC 34.5 33.7 33.2  RDW 13.9 14.0 14.1  PLT 185 186 165   Thyroid  Recent Labs  Lab 05/17/23 1731  TSH 2.212    BNP Recent Labs  Lab 05/17/23 1731  BNP 500.6*    DDimer No results for input(s): "DDIMER" in the last 168 hours.   Radiology    EP STUDY  Result Date: 05/19/2023 See surgical note for result.  ECHOCARDIOGRAM COMPLETE  Result Date: 05/18/2023    ECHOCARDIOGRAM REPORT   Patient Name:   BRANNDON DIVIN Date of Exam: 05/18/2023 Medical Rec #:  [id number]       Height:       72.0 in Accession #:    [id number]      Weight:       326.0 lb Date of Birth:  12-06-1950       BSA:          2.622 m Patient Age:    72 years        BP:           101/86 mmHg Patient Gender: M                HR:           95 bpm. Exam Location:  Inpatient Procedure: 2D Echo, Cardiac Doppler and Color Doppler Indications:    Atrial Flutter I48.92  History:        Patient has prior history of Echocardiogram examinations, most                 recent 03/22/2017. Previous Myocardial Infarction, Prior CABG,                 Arrythmias:Atrial Flutter, Atrial Fibrillation and RBBB; Risk                 Factors:Dyslipidemia, Hypertension and Former Smoker.  Sonographer:    Dondra Prader RVT RCS Referring Phys: [id number] Angie Fava  Sonographer Comments: Technically difficult study due to poor echo windows, suboptimal subcostal window, suboptimal apical window, suboptimal parasternal window and patient is obese. Image acquisition challenging due to patient body habitus and Unable to  lie still for exam. Unable to get accurate measurements due to elevated HR and A-fib. 134 BPM IMPRESSIONS  1. Left ventricular ejection fraction, by estimation, is 65 to 70%. The left ventricle has normal function. Left ventricular endocardial border not optimally defined to evaluate regional wall motion. Left ventricular diastolic function could not be evaluated.  2. Right ventricular systolic function was not well visualized. The right ventricular size is not well visualized.  3. Left atrial size was mildly dilated.  4. Right atrial size was mildly dilated.  5. The mitral valve is abnormal. No evidence of mitral valve regurgitation. No evidence of mitral stenosis.  6. The aortic valve was not well visualized. Aortic valve regurgitation is not visualized.  7. Aortic dilatation noted. 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  8. The inferior vena cava is dilated in size with >50% respiratory variability, suggesting right atrial pressure of 8 mmHg. Comparison(s): Similar to 2018 study. FINDINGS  Left Ventricle: Left ventricular ejection fraction, by estimation, is  65 to 70%. The left ventricle has normal function. Left  ventricular endocardial border not optimally defined to evaluate regional wall motion. The left ventricular internal cavity size was small. Suboptimal image quality limits for assessment of left ventricular hypertrophy. Left ventricular diastolic function could not be evaluated due to atrial fibrillation. Left ventricular diastolic function could not be evaluated. Right Ventricle: The right ventricular size is not well visualized. No increase in right ventricular wall thickness. Right ventricular systolic function was not well visualized. Left Atrium: Left atrial size was mildly dilated. Right Atrium: Right atrial size was mildly dilated. Pericardium: Trivial pericardial effusion is present. The pericardial effusion is surrounding the apex. Mitral Valve: The mitral valve is abnormal. No evidence of mitral valve regurgitation. No evidence of mitral valve stenosis. Tricuspid Valve: The tricuspid valve is not well visualized. Tricuspid valve regurgitation is not demonstrated. No evidence of tricuspid stenosis. Aortic Valve: The aortic valve was not well visualized. Aortic valve regurgitation is not visualized. Aortic valve mean gradient measures 11.0 mmHg. Aortic valve peak gradient measures 16.4 mmHg. Aortic valve area, by VTI measures 2.81 cm. Pulmonic Valve: The pulmonic valve was not well visualized. Pulmonic valve regurgitation is not visualized. No evidence of pulmonic stenosis. Aorta: Aortic dilatation noted. 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 Venous: The inferior vena cava is dilated in size with greater than 50% respiratory variability, suggesting right atrial pressure of 8 mmHg. IAS/Shunts: No atrial level shunt detected by color flow Doppler.  LEFT VENTRICLE PLAX 2D LVOT diam:     2.30 cm LV SV:         73 LV SV Index:   28 LVOT Area:     4.15 cm  IVC IVC diam: 2.40 cm AORTIC VALVE                     PULMONIC VALVE AV Area (Vmax):    2.77 cm      PV Vmax:       1.15 m/s AV  Area (Vmean):   2.64 cm      PV Peak grad:  5.3 mmHg AV Area (VTI):     2.81 cm AV Vmax:           202.67 cm/s AV Vmean:          132.667 cm/s AV VTI:            0.258 m AV Peak Grad:      16.4 mmHg AV Mean Grad:      11.0 mmHg LVOT Vmax:         135.00 cm/s LVOT Vmean:        84.300 cm/s LVOT VTI:          0.175 m LVOT/AV VTI ratio: 0.68  AORTA Ao Root diam: 3.60 cm Ao Asc diam:  4.40 cm  SHUNTS Systemic VTI:  0.18 m Systemic Diam: 2.30 cm Riley Lam MD Electronically signed by Riley Lam MD Signature Date/Time: 05/18/2023/1:24:31 PM    Final     Cardiac Studies   TTE 05/18/2023: Impressions: 1. Left ventricular ejection fraction, by estimation, is 65 to 70%. The  left ventricle has normal function. Left ventricular endocardial border  not optimally defined to evaluate regional wall motion. Left ventricular  diastolic function could not be  evaluated.   2. Right ventricular systolic function was not well visualized. The right  ventricular size is not well visualized.   3. Left atrial size was mildly dilated.  4. Right atrial size was mildly dilated.   5. The mitral valve is abnormal. No evidence of mitral valve  regurgitation. No evidence of mitral stenosis.   6. The aortic valve was not well visualized. Aortic valve regurgitation  is not visualized.   7. Aortic dilatation noted. There is mild dilatation of the ascending  aorta, measuring 44 mm.   8. The inferior vena cava is dilated in size with >50% respiratory  variability, suggesting right atrial pressure of 8 mmHg.  _______________  TEE/ DCCV 05/19/2023: Findings: LEFT VENTRICLE: EF = 60-65%. No regional wall motion abnormalities. RIGHT VENTRICLE: Normal size and function.  LEFT ATRIUM: No thrombus/mass. LEFT ATRIAL APPENDAGE: No thrombus/mass.  RIGHT ATRIUM: No thrombus/mass. AORTIC VALVE:  Trileaflet. Trivial regurgitation. No vegetation. MITRAL VALVE:    Normal structure. Trivial regurgitation. No  vegetation TRICUSPID VALVE: Normal structure. Trivial regurgitation. No vegetation. PULMONIC VALVE: Grossly normal structure. Trivial regurgitation. No apparent vegetation. INTERATRIAL SEPTUM: No PFO or ASD seen by color Doppler. PERICARDIUM: No effusion noted. DESCENDING AORTA: Moderate diffuse plaque seen ASCENDING AORTA: mildly dilated   Cardioversion:  Indications:  Symptomatic Atrial Flutter   Procedure Details: Once the TEE was complete, the patient had the defibrillator pads placed in the anterior and posterior position. Once an appropriate level of sedation was confirmed, the patient was cardioverted x 1 with 150J of biphasic synchronized energy.  The patient converted to NSR.  There were no apparent complications.  The patient had normal neuro status and respiratory status post procedure with vitals stable as recorded elsewhere.  Adequate airway was maintained throughout and vital signs monitored per protocol.   Patient Profile     72 y.o. male with a history of CAD s/p CABG x6 in 03/2017, paroxysmal atrial fibrillation on Eliquis, chronic HFpEF, hypertension, hyperlipidemia, type 2 diabetes mellitus, obstructive sleep apnea on CPAP, anxiety, and obesity who was admitted on atrial flutter with RVR after presenting with shortness of breath.  Assessment & Plan    Atrial Flutter with RVR Paroxysmal Atrial Fibrillation Patient has a history of paroxysmal atrial fibrillation but was admitted with rapid atrial flutter. Echo showed normal LV function. He was initially started on IV Cardizem and was then transitioned to PO Cardizem. He underwent successful TEE/ DCCV on 8/28 with restoration of sinus rhythm. - Maintaining sinus rhythm but having frequent PVCs.  - Potassium 4.3 yesterday and Magnesium 2.1 on 05/18/2023. Will recheck these. - Continue Cardizem CD 360mg  daily. - Looks like he was on Toprol-XL 100mg  daily at home but is not on any here. Can consider re-adding Toprol-XL at 25mg   daily. Will review with MD. - Continue chronic anticoagulation with Eliquis 5mg  twice daily.  CAD s/p CABG History of CABG x6 in 2018.  - No chest pain. - No aspirin given need for full anticoagulation. - Continue statin.  Acute on Chronic HFpEF BNP elevated at 500. Chest x-ray showed no edema. Echo showed LVEF of 65-70%. - He reports dyspnea with minimal activity such as walking ot the bathroom and attributes this to his chronic knee pain and weight. However, he looks volume overloaded. Lungs are clear on exam but he did have some mild increased work of breathing and tachypnea at times. He also has lower extremity edema with weeping of legs which has been going on for a while. - He is very eager to go home but I think he would benefit from IV Lasix. At a minimum, would recommend one dose of IV Lasix 40mg  before going home  and then can prescribe PO Lasix at discharge. Will review with MD. - Will restart Jardiance 25mg  daily. - Primary team has ordered compression stockings. - Discussed importance of fluid and sodium restrictions.  Hypertension BP well controlled.  - Continue Cardizem CD 360mg  daily. - On Olmesartan-HCTZ 40-12.5mg  daily at home. Placed on Irbesartan 150mg  daily here (Olmesartan not on formulary). Will likely go home on Lasix rather than HCTZ.  Hyperlipidemia - Continue Lipitor 40mg  daily.  Type 2 Diabetes Mellitus - Management per primary team.  Obstructive Sleep Apnea - Continue CPAP.    For questions or updates, please contact [facility] Please consult [email or web address] for contact info under        Signed, Corrin Parker, PA-C  05/20/2023, 9:16 AM    Patient seen and examined   I agree with findings asnoted above by c Irene Limbo PT back in SR   Tele shows SR with PVCs On exam;  Pt sitting in chair Neck  Full Lungs CTA with mild wheeze Cardiac RRR  Occasional skip  NO S3   Abd  OBese Ext 1-2+ edema   Erythema legs    Atrial flutter   Converted to SR yesterday   Keep on Eliquis   HFpEF  Volume is up   I would give 1 dose of IV lasix 80 mg   WOuld be good to decrease filling pressures some   Will help decrease risk of reversion to atrial flutter     Probably send home on 40 lasix daily with 20 KCL     ETOH  Admits to drinking 2 bottles of wine per night   Advised to quit     Pt is extremely eager to go home today   Will assess in pm  May be ablve to with close outpt follow up  Dietrich Pates MD

## 2023-05-20 NOTE — Care Management (Signed)
Transition of Care [facility]) - Inpatient Brief Assessment   Patient Details  Name: David Frost MRN: [id number] Date of Birth: 10-01-1950  Transition of Care [facility]) CM/SW Contact:    Lockie Pares, RN Phone Number: 05/20/2023, 1:38 PM   Clinical Narrative: Patient presented with afib. Walks with cane, no F?U PT identified. If a needs is identified, please place a TOC consult   Transition of Care Asessment: Insurance and Status: Insurance coverage has been reviewed Patient has primary care physician: Yes Home environment has been reviewed: yes Prior level of function:: walks with cane Prior/Current Home Services: No current home services Social Determinants of Health Reivew: SDOH reviewed no interventions necessary Readmission risk has been reviewed: Yes

## 2023-06-02 DIAGNOSIS — E11622 Type 2 diabetes mellitus with other skin ulcer: Secondary | ICD-10-CM | POA: Diagnosis not present

## 2023-06-02 DIAGNOSIS — Z79899 Other long term (current) drug therapy: Secondary | ICD-10-CM | POA: Diagnosis not present

## 2023-06-02 DIAGNOSIS — L97812 Non-pressure chronic ulcer of other part of right lower leg with fat layer exposed: Secondary | ICD-10-CM | POA: Diagnosis not present

## 2023-06-02 DIAGNOSIS — I4891 Unspecified atrial fibrillation: Secondary | ICD-10-CM | POA: Diagnosis not present

## 2023-06-02 DIAGNOSIS — I890 Lymphedema, not elsewhere classified: Secondary | ICD-10-CM | POA: Diagnosis not present

## 2023-06-02 DIAGNOSIS — L97909 Non-pressure chronic ulcer of unspecified part of unspecified lower leg with unspecified severity: Secondary | ICD-10-CM | POA: Diagnosis not present

## 2023-06-02 DIAGNOSIS — L97822 Non-pressure chronic ulcer of other part of left lower leg with fat layer exposed: Secondary | ICD-10-CM | POA: Diagnosis not present

## 2023-06-02 DIAGNOSIS — F17210 Nicotine dependence, cigarettes, uncomplicated: Secondary | ICD-10-CM | POA: Diagnosis not present

## 2023-06-02 DIAGNOSIS — L97212 Non-pressure chronic ulcer of right calf with fat layer exposed: Secondary | ICD-10-CM | POA: Diagnosis not present

## 2023-06-02 DIAGNOSIS — I83009 Varicose veins of unspecified lower extremity with ulcer of unspecified site: Secondary | ICD-10-CM | POA: Diagnosis not present

## 2023-06-02 DIAGNOSIS — I872 Venous insufficiency (chronic) (peripheral): Secondary | ICD-10-CM | POA: Diagnosis not present

## 2023-06-07 DIAGNOSIS — L97909 Non-pressure chronic ulcer of unspecified part of unspecified lower leg with unspecified severity: Secondary | ICD-10-CM | POA: Diagnosis not present

## 2023-06-07 DIAGNOSIS — I70203 Unspecified atherosclerosis of native arteries of extremities, bilateral legs: Secondary | ICD-10-CM | POA: Diagnosis not present

## 2023-06-07 DIAGNOSIS — I83009 Varicose veins of unspecified lower extremity with ulcer of unspecified site: Secondary | ICD-10-CM | POA: Diagnosis not present

## 2023-06-07 DIAGNOSIS — R0989 Other specified symptoms and signs involving the circulatory and respiratory systems: Secondary | ICD-10-CM | POA: Diagnosis not present

## 2023-06-09 DIAGNOSIS — I83009 Varicose veins of unspecified lower extremity with ulcer of unspecified site: Secondary | ICD-10-CM | POA: Diagnosis not present

## 2023-06-09 DIAGNOSIS — L97909 Non-pressure chronic ulcer of unspecified part of unspecified lower leg with unspecified severity: Secondary | ICD-10-CM | POA: Diagnosis not present

## 2023-06-09 DIAGNOSIS — I4891 Unspecified atrial fibrillation: Secondary | ICD-10-CM | POA: Diagnosis not present

## 2023-06-09 DIAGNOSIS — L97822 Non-pressure chronic ulcer of other part of left lower leg with fat layer exposed: Secondary | ICD-10-CM | POA: Diagnosis not present

## 2023-06-09 DIAGNOSIS — Z23 Encounter for immunization: Secondary | ICD-10-CM | POA: Diagnosis not present

## 2023-06-09 DIAGNOSIS — I890 Lymphedema, not elsewhere classified: Secondary | ICD-10-CM | POA: Diagnosis not present

## 2023-06-09 DIAGNOSIS — E11622 Type 2 diabetes mellitus with other skin ulcer: Secondary | ICD-10-CM | POA: Diagnosis not present

## 2023-06-09 DIAGNOSIS — L97812 Non-pressure chronic ulcer of other part of right lower leg with fat layer exposed: Secondary | ICD-10-CM | POA: Diagnosis not present

## 2023-06-09 DIAGNOSIS — L97212 Non-pressure chronic ulcer of right calf with fat layer exposed: Secondary | ICD-10-CM | POA: Diagnosis not present

## 2023-06-10 ENCOUNTER — Ambulatory Visit: Payer: BLUE CROSS/BLUE SHIELD | Admitting: Adult Health

## 2023-06-16 DIAGNOSIS — L97909 Non-pressure chronic ulcer of unspecified part of unspecified lower leg with unspecified severity: Secondary | ICD-10-CM | POA: Diagnosis not present

## 2023-06-16 DIAGNOSIS — I890 Lymphedema, not elsewhere classified: Secondary | ICD-10-CM | POA: Diagnosis not present

## 2023-06-16 DIAGNOSIS — I4891 Unspecified atrial fibrillation: Secondary | ICD-10-CM | POA: Diagnosis not present

## 2023-06-16 DIAGNOSIS — L97812 Non-pressure chronic ulcer of other part of right lower leg with fat layer exposed: Secondary | ICD-10-CM | POA: Diagnosis not present

## 2023-06-16 DIAGNOSIS — L97212 Non-pressure chronic ulcer of right calf with fat layer exposed: Secondary | ICD-10-CM | POA: Diagnosis not present

## 2023-06-16 DIAGNOSIS — L97822 Non-pressure chronic ulcer of other part of left lower leg with fat layer exposed: Secondary | ICD-10-CM | POA: Diagnosis not present

## 2023-06-16 DIAGNOSIS — I83009 Varicose veins of unspecified lower extremity with ulcer of unspecified site: Secondary | ICD-10-CM | POA: Diagnosis not present

## 2023-06-16 DIAGNOSIS — E11622 Type 2 diabetes mellitus with other skin ulcer: Secondary | ICD-10-CM | POA: Diagnosis not present

## 2023-06-30 DIAGNOSIS — L97809 Non-pressure chronic ulcer of other part of unspecified lower leg with unspecified severity: Secondary | ICD-10-CM | POA: Diagnosis not present

## 2023-06-30 DIAGNOSIS — I87303 Chronic venous hypertension (idiopathic) without complications of bilateral lower extremity: Secondary | ICD-10-CM | POA: Diagnosis not present

## 2023-06-30 DIAGNOSIS — L97822 Non-pressure chronic ulcer of other part of left lower leg with fat layer exposed: Secondary | ICD-10-CM | POA: Diagnosis not present

## 2023-06-30 DIAGNOSIS — R609 Edema, unspecified: Secondary | ICD-10-CM | POA: Diagnosis not present

## 2023-06-30 DIAGNOSIS — I480 Paroxysmal atrial fibrillation: Secondary | ICD-10-CM | POA: Diagnosis not present

## 2023-06-30 DIAGNOSIS — E11622 Type 2 diabetes mellitus with other skin ulcer: Secondary | ICD-10-CM | POA: Diagnosis not present

## 2023-06-30 DIAGNOSIS — L97322 Non-pressure chronic ulcer of left ankle with fat layer exposed: Secondary | ICD-10-CM | POA: Diagnosis not present

## 2023-06-30 DIAGNOSIS — I872 Venous insufficiency (chronic) (peripheral): Secondary | ICD-10-CM | POA: Diagnosis not present

## 2023-06-30 DIAGNOSIS — I10 Essential (primary) hypertension: Secondary | ICD-10-CM | POA: Diagnosis not present

## 2023-06-30 DIAGNOSIS — L97812 Non-pressure chronic ulcer of other part of right lower leg with fat layer exposed: Secondary | ICD-10-CM | POA: Diagnosis not present

## 2023-06-30 DIAGNOSIS — I4891 Unspecified atrial fibrillation: Secondary | ICD-10-CM | POA: Diagnosis not present

## 2023-07-07 DIAGNOSIS — L97822 Non-pressure chronic ulcer of other part of left lower leg with fat layer exposed: Secondary | ICD-10-CM | POA: Diagnosis not present

## 2023-07-07 DIAGNOSIS — I4891 Unspecified atrial fibrillation: Secondary | ICD-10-CM | POA: Diagnosis not present

## 2023-07-07 DIAGNOSIS — I890 Lymphedema, not elsewhere classified: Secondary | ICD-10-CM | POA: Diagnosis not present

## 2023-07-07 DIAGNOSIS — E11622 Type 2 diabetes mellitus with other skin ulcer: Secondary | ICD-10-CM | POA: Diagnosis not present

## 2023-07-07 DIAGNOSIS — L97812 Non-pressure chronic ulcer of other part of right lower leg with fat layer exposed: Secondary | ICD-10-CM | POA: Diagnosis not present

## 2023-07-07 DIAGNOSIS — I872 Venous insufficiency (chronic) (peripheral): Secondary | ICD-10-CM | POA: Diagnosis not present

## 2023-07-07 DIAGNOSIS — L97322 Non-pressure chronic ulcer of left ankle with fat layer exposed: Secondary | ICD-10-CM | POA: Diagnosis not present

## 2023-07-08 DIAGNOSIS — L97322 Non-pressure chronic ulcer of left ankle with fat layer exposed: Secondary | ICD-10-CM | POA: Diagnosis not present

## 2023-07-08 DIAGNOSIS — I4891 Unspecified atrial fibrillation: Secondary | ICD-10-CM | POA: Diagnosis not present

## 2023-07-08 DIAGNOSIS — L97822 Non-pressure chronic ulcer of other part of left lower leg with fat layer exposed: Secondary | ICD-10-CM | POA: Diagnosis not present

## 2023-07-08 DIAGNOSIS — I872 Venous insufficiency (chronic) (peripheral): Secondary | ICD-10-CM | POA: Diagnosis not present

## 2023-07-08 DIAGNOSIS — L97812 Non-pressure chronic ulcer of other part of right lower leg with fat layer exposed: Secondary | ICD-10-CM | POA: Diagnosis not present

## 2023-07-08 DIAGNOSIS — E11622 Type 2 diabetes mellitus with other skin ulcer: Secondary | ICD-10-CM | POA: Diagnosis not present

## 2023-07-14 DIAGNOSIS — L97322 Non-pressure chronic ulcer of left ankle with fat layer exposed: Secondary | ICD-10-CM | POA: Diagnosis not present

## 2023-07-14 DIAGNOSIS — I83028 Varicose veins of left lower extremity with ulcer other part of lower leg: Secondary | ICD-10-CM | POA: Diagnosis not present

## 2023-07-14 DIAGNOSIS — L97822 Non-pressure chronic ulcer of other part of left lower leg with fat layer exposed: Secondary | ICD-10-CM | POA: Diagnosis not present

## 2023-07-14 DIAGNOSIS — I83018 Varicose veins of right lower extremity with ulcer other part of lower leg: Secondary | ICD-10-CM | POA: Diagnosis not present

## 2023-07-14 DIAGNOSIS — L97812 Non-pressure chronic ulcer of other part of right lower leg with fat layer exposed: Secondary | ICD-10-CM | POA: Diagnosis not present

## 2023-07-14 DIAGNOSIS — I83023 Varicose veins of left lower extremity with ulcer of ankle: Secondary | ICD-10-CM | POA: Diagnosis not present

## 2023-07-22 DIAGNOSIS — M170 Bilateral primary osteoarthritis of knee: Secondary | ICD-10-CM | POA: Diagnosis not present

## 2023-07-28 DIAGNOSIS — I83009 Varicose veins of unspecified lower extremity with ulcer of unspecified site: Secondary | ICD-10-CM | POA: Diagnosis not present

## 2023-07-28 DIAGNOSIS — F17290 Nicotine dependence, other tobacco product, uncomplicated: Secondary | ICD-10-CM | POA: Diagnosis not present

## 2023-07-28 DIAGNOSIS — L97812 Non-pressure chronic ulcer of other part of right lower leg with fat layer exposed: Secondary | ICD-10-CM | POA: Diagnosis not present

## 2023-07-28 DIAGNOSIS — R600 Localized edema: Secondary | ICD-10-CM | POA: Diagnosis not present

## 2023-07-28 DIAGNOSIS — L97322 Non-pressure chronic ulcer of left ankle with fat layer exposed: Secondary | ICD-10-CM | POA: Diagnosis not present

## 2023-07-28 DIAGNOSIS — I872 Venous insufficiency (chronic) (peripheral): Secondary | ICD-10-CM | POA: Diagnosis not present

## 2023-07-28 DIAGNOSIS — L97909 Non-pressure chronic ulcer of unspecified part of unspecified lower leg with unspecified severity: Secondary | ICD-10-CM | POA: Diagnosis not present

## 2023-07-29 DIAGNOSIS — E66813 Obesity, class 3: Secondary | ICD-10-CM | POA: Diagnosis not present

## 2023-07-29 DIAGNOSIS — M25561 Pain in right knee: Secondary | ICD-10-CM | POA: Diagnosis not present

## 2023-07-29 DIAGNOSIS — Z6841 Body Mass Index (BMI) 40.0 and over, adult: Secondary | ICD-10-CM | POA: Diagnosis not present

## 2023-07-29 DIAGNOSIS — F17210 Nicotine dependence, cigarettes, uncomplicated: Secondary | ICD-10-CM | POA: Diagnosis not present

## 2023-07-29 DIAGNOSIS — G8929 Other chronic pain: Secondary | ICD-10-CM | POA: Diagnosis not present

## 2023-07-29 DIAGNOSIS — M170 Bilateral primary osteoarthritis of knee: Secondary | ICD-10-CM | POA: Diagnosis not present

## 2023-07-29 DIAGNOSIS — M25562 Pain in left knee: Secondary | ICD-10-CM | POA: Diagnosis not present

## 2023-08-04 DIAGNOSIS — R600 Localized edema: Secondary | ICD-10-CM | POA: Diagnosis not present

## 2023-08-04 DIAGNOSIS — L97812 Non-pressure chronic ulcer of other part of right lower leg with fat layer exposed: Secondary | ICD-10-CM | POA: Diagnosis not present

## 2023-08-04 DIAGNOSIS — L97909 Non-pressure chronic ulcer of unspecified part of unspecified lower leg with unspecified severity: Secondary | ICD-10-CM | POA: Diagnosis not present

## 2023-08-04 DIAGNOSIS — L97322 Non-pressure chronic ulcer of left ankle with fat layer exposed: Secondary | ICD-10-CM | POA: Diagnosis not present

## 2023-08-04 DIAGNOSIS — F17290 Nicotine dependence, other tobacco product, uncomplicated: Secondary | ICD-10-CM | POA: Diagnosis not present

## 2023-08-04 DIAGNOSIS — I872 Venous insufficiency (chronic) (peripheral): Secondary | ICD-10-CM | POA: Diagnosis not present

## 2023-08-04 DIAGNOSIS — I83009 Varicose veins of unspecified lower extremity with ulcer of unspecified site: Secondary | ICD-10-CM | POA: Diagnosis not present

## 2023-08-18 DIAGNOSIS — I83009 Varicose veins of unspecified lower extremity with ulcer of unspecified site: Secondary | ICD-10-CM | POA: Diagnosis not present

## 2023-08-18 DIAGNOSIS — F17290 Nicotine dependence, other tobacco product, uncomplicated: Secondary | ICD-10-CM | POA: Diagnosis not present

## 2023-08-18 DIAGNOSIS — L97812 Non-pressure chronic ulcer of other part of right lower leg with fat layer exposed: Secondary | ICD-10-CM | POA: Diagnosis not present

## 2023-08-18 DIAGNOSIS — L97909 Non-pressure chronic ulcer of unspecified part of unspecified lower leg with unspecified severity: Secondary | ICD-10-CM | POA: Diagnosis not present

## 2023-08-18 DIAGNOSIS — L97322 Non-pressure chronic ulcer of left ankle with fat layer exposed: Secondary | ICD-10-CM | POA: Diagnosis not present

## 2023-08-18 DIAGNOSIS — I872 Venous insufficiency (chronic) (peripheral): Secondary | ICD-10-CM | POA: Diagnosis not present

## 2023-08-18 DIAGNOSIS — R600 Localized edema: Secondary | ICD-10-CM | POA: Diagnosis not present

## 2023-08-30 ENCOUNTER — Other Ambulatory Visit: Payer: Self-pay | Admitting: Cardiovascular Disease

## 2023-08-31 ENCOUNTER — Emergency Department (HOSPITAL_BASED_OUTPATIENT_CLINIC_OR_DEPARTMENT_OTHER)
Admission: EM | Admit: 2023-08-31 | Discharge: 2023-08-31 | Disposition: A | Discharge status: Home / Self Care | Payer: Medicare Other | Attending: Emergency Medicine | Admitting: Emergency Medicine

## 2023-08-31 ENCOUNTER — Other Ambulatory Visit: Payer: Self-pay

## 2023-08-31 ENCOUNTER — Encounter (HOSPITAL_BASED_OUTPATIENT_CLINIC_OR_DEPARTMENT_OTHER): Payer: Self-pay | Admitting: Emergency Medicine

## 2023-08-31 ENCOUNTER — Emergency Department (HOSPITAL_BASED_OUTPATIENT_CLINIC_OR_DEPARTMENT_OTHER): Payer: Medicare Other

## 2023-08-31 DIAGNOSIS — I483 Typical atrial flutter: Secondary | ICD-10-CM | POA: Insufficient documentation

## 2023-08-31 DIAGNOSIS — Z79899 Other long term (current) drug therapy: Secondary | ICD-10-CM | POA: Diagnosis not present

## 2023-08-31 DIAGNOSIS — R000 Tachycardia, unspecified: Secondary | ICD-10-CM | POA: Diagnosis not present

## 2023-08-31 DIAGNOSIS — Z7984 Long term (current) use of oral hypoglycemic drugs: Secondary | ICD-10-CM | POA: Diagnosis not present

## 2023-08-31 DIAGNOSIS — R0602 Shortness of breath: Secondary | ICD-10-CM | POA: Insufficient documentation

## 2023-08-31 DIAGNOSIS — Z7901 Long term (current) use of anticoagulants: Secondary | ICD-10-CM | POA: Insufficient documentation

## 2023-08-31 DIAGNOSIS — Z951 Presence of aortocoronary bypass graft: Secondary | ICD-10-CM | POA: Diagnosis not present

## 2023-08-31 DIAGNOSIS — I4892 Unspecified atrial flutter: Secondary | ICD-10-CM | POA: Diagnosis not present

## 2023-08-31 DIAGNOSIS — Z7982 Long term (current) use of aspirin: Secondary | ICD-10-CM | POA: Insufficient documentation

## 2023-08-31 DIAGNOSIS — I517 Cardiomegaly: Secondary | ICD-10-CM | POA: Diagnosis not present

## 2023-08-31 HISTORY — DX: Unspecified atrial fibrillation: I48.91

## 2023-08-31 LAB — COMPREHENSIVE METABOLIC PANEL
ALT: 18 U/L (ref 0–44)
AST: 11 U/L — ABNORMAL LOW (ref 15–41)
Albumin: 3.8 g/dL (ref 3.5–5.0)
Alkaline Phosphatase: 68 U/L (ref 38–126)
Anion gap: 9 (ref 5–15)
BUN: 35 mg/dL — ABNORMAL HIGH (ref 8–23)
CO2: 26 mmol/L (ref 22–32)
Calcium: 9.3 mg/dL (ref 8.9–10.3)
Chloride: 99 mmol/L (ref 98–111)
Creatinine, Ser: 1.04 mg/dL (ref 0.61–1.24)
GFR, Estimated: >60 mL/min (ref >60)
Glucose, Bld: 162 mg/dL — ABNORMAL HIGH (ref 70–99)
Potassium: 4.2 mmol/L (ref 3.5–5.1)
Sodium: 134 mmol/L — ABNORMAL LOW (ref 135–145)
Total Bilirubin: 0.4 mg/dL (ref <1.2)
Total Protein: 6.9 g/dL (ref 6.5–8.1)

## 2023-08-31 LAB — CBC WITH DIFFERENTIAL/PLATELET
Abs Immature Granulocytes: 0.11 10*3/uL — ABNORMAL HIGH (ref 0.00–0.07)
Basophils Absolute: 0 10*3/uL (ref 0.0–0.1)
Basophils Relative: 0 %
Eosinophils Absolute: 0.1 10*3/uL (ref 0.0–0.5)
Eosinophils Relative: 1 %
HCT: 38.8 % — ABNORMAL LOW (ref 39.0–52.0)
Hemoglobin: 13.1 g/dL (ref 13.0–17.0)
Immature Granulocytes: 1 %
Lymphocytes Relative: 22 %
Lymphs Abs: 2.3 10*3/uL (ref 0.7–4.0)
MCH: 30.6 pg (ref 26.0–34.0)
MCHC: 33.8 g/dL (ref 30.0–36.0)
MCV: 90.7 fL (ref 80.0–100.0)
Monocytes Absolute: 1 10*3/uL (ref 0.1–1.0)
Monocytes Relative: 10 %
Neutro Abs: 6.5 10*3/uL (ref 1.7–7.7)
Neutrophils Relative %: 66 %
Platelets: 228 10*3/uL (ref 150–400)
RBC: 4.28 MIL/uL (ref 4.22–5.81)
RDW: 14.6 % (ref 11.5–15.5)
WBC: 10.1 10*3/uL (ref 4.0–10.5)
nRBC: 0 % (ref 0.0–0.2)

## 2023-08-31 LAB — MAGNESIUM: Magnesium: 2 mg/dL (ref 1.7–2.4)

## 2023-08-31 LAB — TROPONIN I (HIGH SENSITIVITY): Troponin I (High Sensitivity): 10 ng/L (ref <18)

## 2023-08-31 LAB — TSH: TSH: 1.576 u[IU]/mL (ref 0.350–4.500)

## 2023-08-31 LAB — BRAIN NATRIURETIC PEPTIDE: B Natriuretic Peptide: 219 pg/mL — ABNORMAL HIGH (ref 0.0–100.0)

## 2023-08-31 MED ORDER — ETOMIDATE 2 MG/ML IV SOLN
10.0000 mg | Freq: Once | INTRAVENOUS | Status: DC
  Filled 2023-08-31: qty 10

## 2023-08-31 MED ORDER — METOPROLOL TARTRATE 5 MG/5ML IV SOLN
5.0000 mg | Freq: Once | INTRAVENOUS | Status: AC
  Administered 2023-08-31: 5 mg via INTRAVENOUS
  Filled 2023-08-31: qty 5

## 2023-08-31 MED ORDER — SODIUM CHLORIDE 0.9 % IV BOLUS
250.0000 mL | Freq: Once | INTRAVENOUS | Status: AC
  Administered 2023-08-31: 250 mL via INTRAVENOUS

## 2023-08-31 MED ORDER — ETOMIDATE 2 MG/ML IV SOLN
INTRAVENOUS | Status: AC | PRN
  Administered 2023-08-31: 10 mg via INTRAVENOUS

## 2023-08-31 NOTE — ED Notes (Addendum)
Shock administered at 120j

## 2023-08-31 NOTE — ED Provider Notes (Signed)
David Frost Provider Note   CSN: [id number] Arrival date & time: 08/31/23  1025     History  Chief Complaint  Patient presents with   Tachycardia    David Frost is a 72 y.o. male.  72 yo M with a sensation that his heart is racing.  This has been going on for a day or 2.  He has been checking out on his Apple Watch and is noticed the issue.  He has been having difficulty breathing.  It sounds like this has been an ongoing problem for him.  Worse when he tries to go to sleep with his BiPAP machine.  Says he wakes up at least every hour and does not tend to sleep well.  He is struggled with lower extremity edema other thinks it is better than it has been recently.  He has been having chronic knee pain that he also thinks is unchanged.  He denies cough congestion or fever.  Denies chest pain or pressure.  Denies abdominal pain nausea vomiting or diarrhea.        Home Medications Prior to Admission medications   Medication Sig Start Date End Date Taking? Authorizing Provider  diltiazem (CARDIZEM LA) 360 MG 24 hr tablet Take 360 mg by mouth daily. 07/01/23  Yes [provider]  acetaminophen (TYLENOL) 325 MG tablet Take 2 tablets (650 mg total) by mouth every 6 (six) hours as needed for mild pain. 03/27/17   Barrett, Erin R, PA-C  albuterol (VENTOLIN HFA) 108 (90 Base) MCG/ACT inhaler Inhale 2 puffs into the lungs every 6 (six) hours as needed for wheezing or shortness of breath.    [provider]  ALPRAZolam Prudy Feeler) 0.25 MG tablet Take 1 tablet (0.25 mg total) by mouth 2 (two) times daily as needed for anxiety. 03/27/17   Barrett, Erin R, PA-C  apixaban (ELIQUIS) 5 MG TABS tablet Take 1 tablet (5 mg total) by mouth 2 (two) times daily. 05/20/23   Burnadette Pop, MD  aspirin EC 81 MG tablet Take 1 tablet (81 mg total) by mouth daily. 09/23/17   Barrett, Joline Salt, PA-C  atorvastatin (LIPITOR) 40 MG tablet TAKE 1 TABLET(40 MG) BY  MOUTH DAILY 11/23/22   Runell Gess, MD  bimatoprost (LUMIGAN) 0.01 % SOLN Lumigan 0.01 % eye drops  INSTILL 1 DROP INTO LEFT EYE EVERY NIGHT AT BEDTIME    [provider]  Cholecalciferol 1.25 MG (50000 UT) capsule Take 50,000 Units by mouth once a week. 03/03/23   [provider]  diltiazem (CARDIZEM CD) 360 MG 24 hr capsule Take 1 capsule (360 mg total) by mouth daily. 05/21/23   Burnadette Pop, MD  fluticasone (FLONASE) 50 MCG/ACT nasal spray Place 1 spray into both nostrils daily. 04/20/23   [provider]  furosemide (LASIX) 40 MG tablet Take 1 tablet (40 mg total) by mouth 2 (two) times daily. 05/20/23 05/19/24  Burnadette Pop, MD  icosapent Ethyl (VASCEPA) 1 g capsule TAKE 2 CAPSULES(2 GRAMS) BY MOUTH TWICE DAILY 11/25/22   Runell Gess, MD  JARDIANCE 25 MG TABS tablet Take 25 mg by mouth daily.    [provider]  loratadine (CLARITIN) 10 MG tablet Take 10 mg by mouth daily.    [provider]  metFORMIN (GLUCOPHAGE-XR) 500 MG 24 hr tablet Take 500 mg by mouth every evening. With meal.    [provider]  Multiple Vitamins-Minerals (PRESERVISION AREDS 2 PO) Take 1 capsule by mouth  2 (two) times daily.    [provider]  mupirocin ointment (BACTROBAN) 2 % Apply 1 Application topically 3 (three) times daily. For 7 days.    [provider]  nitroGLYCERIN (NITROSTAT) 0.4 MG SL tablet PLACE 1 TABLET UNDER THE TONGUE EVERY 5 MINUTES AS NEEDED FOR CHEST PAIN 02/26/20   Runell Gess, MD  olmesartan (BENICAR) 40 MG tablet Take 1 tablet (40 mg total) by mouth daily. 05/20/23 05/19/24  Burnadette Pop, MD  potassium chloride SA (KLOR-CON M) 20 MEQ tablet Take 1 tablet (20 mEq total) by mouth daily. 05/20/23   Burnadette Pop, MD  Semaglutide,0.25 or 0.5MG /DOS, (OZEMPIC, 0.25 OR 0.5 MG/DOSE,) 2 MG/3ML SOPN INJECT 0.5 MG UNDER THE SKIN ONCE A WEEK Patient not taking: Reported on 05/18/2023 11/23/22   Rayburn, [name],  PA-C  SYMBICORT 160-4.5 MCG/ACT inhaler Inhale 2 puffs into the lungs in the morning and at bedtime. Not to be taken more frequently than twice daily. 01/25/23   [provider]  tirzepatide Greggory Keen) 2.5 MG/0.5ML Pen Inject 2.5 mg into the skin once a week.    [provider]  triamcinolone cream (KENALOG) 0.1 % Apply 1 Application topically 2 (two) times daily as needed (Redness).    [provider]      Allergies    Zolpidem    Review of Systems   Review of Systems  Physical Exam Updated Vital Signs BP (!) 139/95   Pulse 88   Temp 98.6 F (37 C) (Oral)   Resp (!) 25   Wt 136.1 kg   SpO2 96%   BMI 40.69 kg/m  Physical Exam Vitals and nursing note reviewed.  Constitutional:      Appearance: He is well-developed.  HENT:     Head: Normocephalic and atraumatic.  Eyes:     Pupils: Pupils are equal, round, and reactive to light.  Neck:     Vascular: No JVD.  Cardiovascular:     Rate and Rhythm: Normal rate and regular rhythm.     Heart sounds: No murmur heard.    No friction rub. No gallop.  Pulmonary:     Effort: No respiratory distress.     Breath sounds: No wheezing.  Abdominal:     General: There is no distension.     Tenderness: There is no abdominal tenderness. There is no guarding or rebound.  Musculoskeletal:        General: Normal range of motion.     Cervical back: Normal range of motion and neck supple.  Skin:    Coloration: Skin is not pale.     Findings: No rash.  Neurological:     Mental Status: He is alert and oriented to person, place, and time.  Psychiatric:        Behavior: Behavior normal.     ED Results / Procedures / Treatments   Labs (all labs ordered are listed, but only abnormal results are displayed) Labs Reviewed  CBC WITH DIFFERENTIAL/PLATELET - Abnormal; Notable for the following components:      Result Value   HCT 38.8 (*)    Abs Immature Granulocytes 0.11 (*)    All other components within normal  limits  COMPREHENSIVE METABOLIC PANEL - Abnormal; Notable for the following components:   Sodium 134 (*)    Glucose, Bld 162 (*)    BUN 35 (*)    AST 11 (*)    All other components within normal limits  BRAIN NATRIURETIC PEPTIDE - Abnormal; Notable for  the following components:   B Natriuretic Peptide 219.0 (*)    All other components within normal limits  MAGNESIUM  TSH  TROPONIN I (HIGH SENSITIVITY)    EKG EKG Interpretation Date/Time:  Tuesday August 31 2023 12:48:38 EST Ventricular Rate:  80 PR Interval:  258 QRS Duration:  174 QT Interval:  412 QTC Calculation: 476 R Axis:   -86  Text Interpretation: Sinus rhythm Ventricular premature complex 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  Tachycardia. EXAM: PORTABLE CHEST 1 VIEW COMPARISON:  Chest x-ray dated May 17, 2023. FINDINGS: Stable cardiomegaly status post CABG. Normal pulmonary vascularity. No focal consolidation, pleural effusion, or pneumothorax. No acute osseous abnormality. IMPRESSION: 1. No active disease. Electronically Signed   By: Obie Dredge M.D.   On: 08/31/2023 12:50    Procedures .Sedation  Date/Time: 08/31/2023 12:45 PM  Performed by: Melene Plan, DO Authorized by: Melene Plan, DO   Consent:    Consent obtained:  Verbal and written   Consent given by:  Patient   Risks discussed:  Allergic reaction, dysrhythmia, inadequate sedation, nausea, vomiting, respiratory compromise necessitating ventilatory assistance and intubation and prolonged hypoxia resulting in organ damage   Alternatives discussed:  Analgesia without sedation Universal protocol:    Procedure explained and questions answered to patient or proxy's satisfaction: yes     Immediately prior to procedure, a time out was  called: yes     Patient identity confirmed:  Anonymous protocol, patient vented/unresponsive and arm band Indications:    Procedure performed:  Cardioversion   Procedure necessitating sedation performed by:  Physician performing sedation Pre-sedation assessment:    Time since last food or drink:  6   ASA classification: class 2 - patient with mild systemic disease     Mouth opening:  2 finger widths   Thyromental distance:  3 finger widths   Mallampati score:  II - soft palate, uvula, fauces visible   Neck mobility: normal     Pre-sedation assessments completed and reviewed: airway patency, cardiovascular function, hydration status, mental status, nausea/vomiting, pain level, respiratory function and temperature   A pre-sedation assessment was completed prior to the start of the procedure Immediate pre-procedure details:    Reassessment: Patient reassessed immediately prior to procedure     Reviewed: vital signs     Verified: bag valve mask available, emergency equipment available, intubation equipment available, IV patency confirmed, oxygen available and suction available   Procedure details (see MAR for exact dosages):    Preoxygenation:  Nasal cannula   Sedation:  Etomidate   Intended level of sedation: deep and moderate (conscious sedation)   Analgesia:  None   Intra-procedure monitoring:  Cardiac monitor, blood pressure monitoring, continuous capnometry, continuous pulse oximetry, frequent LOC assessments and frequent vital sign checks   Intra-procedure events: none     Total Provider sedation time (minutes):  35 Post-procedure details:   A post-sedation assessment was completed following the completion of the procedure.   Attendance: Constant attendance by certified staff until patient recovered     Recovery: Patient returned to pre-procedure baseline     Post-sedation assessments completed and reviewed: airway patency, cardiovascular function, hydration status, mental status,  nausea/vomiting, pain level, respiratory function and temperature     Patient is stable for discharge or admission: yes  Procedure completion:  Tolerated well, no immediate complications .Cardioversion  Date/Time: 08/31/2023 12:46 PM  Performed by: Melene Plan, DO Authorized by: Melene Plan, DO   Consent:    Consent obtained:  Verbal and written   Consent given by:  Patient   Risks discussed:  Cutaneous burn, death, induced arrhythmia and pain   Alternatives discussed:  No treatment Pre-procedure details:    Cardioversion basis:  Elective   Rhythm:  Atrial flutter   Electrode placement:  Anterior-lateral Patient sedated: Yes. Refer to sedation procedure documentation for details of sedation.  Attempt one:    Cardioversion mode:  Synchronous   Waveform:  Biphasic   Shock (Joules):  120   Shock outcome:  Conversion to other rhythm (likely flutter with more pronounced block) Post-procedure details:    Patient status:  Awake   Patient tolerance of procedure:  Tolerated well, no immediate complications .Critical Care  Performed by: Melene Plan, DO Authorized by: Melene Plan, DO   Critical care provider statement:    Critical care time (minutes):  35   Critical care time was exclusive of:  Separately billable procedures and treating other patients   Critical care was time spent personally by me on the following activities:  Development of treatment plan with patient or surrogate, discussions with consultants, evaluation of patient's response to treatment, examination of patient, ordering and review of laboratory studies, ordering and review of radiographic studies, ordering and performing treatments and interventions, pulse oximetry, re-evaluation of patient's condition and review of old charts   Care discussed with: admitting provider       Medications Ordered in ED Medications  etomidate (AMIDATE) injection 10 mg (has no administration in time range)  sodium chloride 0.9 % bolus  250 mL (0 mLs Intravenous Stopped 08/31/23 1141)  metoprolol tartrate (LOPRESSOR) injection 5 mg (5 mg Intravenous Given 08/31/23 1110)    ED Course/ Medical Decision Making/ A&P                                 Medical Decision Making Amount and/or Complexity of Data Reviewed Labs: ordered. Radiology: ordered. ECG/medicine tests: ordered.  Risk Prescription drug management.   72 yo M with a chief complaints of feeling his heart is racing.  Initial EKG looks most consistent with sinus tachycardia though the patient has been in atrial flutter with variable block send perhaps this is a presentation of this.  I will give a bolus dose of metoprolol.  Lab work chest x-ray.  Reassess.  Chest x-ray independently interpreted by me without focal infiltrate or pneumothorax.  No acute anemia no significant electrolyte abnormalities.  BNP is slightly below last check.  Troponin negative.  Thyroid studies normal.  Mag level normal.   Patient was given his bolus dose of metoprolol without any change in his heart rate.  On review of his old EKGs it does seem very similar to when he had had atrial flutter with 2 1 block.  I discussed this with the patient.  Recommended cardioversion.  He states been compliant with his Eliquis without any missed doses.  Cardioversion in the the bedside.  Patient is now in a slower rhythm.  Heart rates in the 80s.  Had some myoclonus from etomidate.  Hard to differentiate P waves or not.  Continue to observe in the ED.  Rhythm much more consistent with normal sinus now.  Patient feeling better.  Would like to go home.  Still having some mild myoclonic jerks from etomidate.  1:17 PM:  I have discussed the diagnosis/risks/treatment options with the patient and family.  Evaluation and diagnostic testing in the emergency department does not suggest an emergent condition requiring admission or immediate intervention beyond what has been performed at this time.  They will  follow up with Cards, afib clinic. We also discussed returning to the ED immediately if new or worsening sx occur. We discussed the sx which are most concerning (e.g., sudden worsening pain, fever, inability to tolerate by mouth, recurrent event) that necessitate immediate return. Medications administered to the patient during their visit and any new prescriptions provided to the patient are listed below.  Medications given during this visit Medications  etomidate (AMIDATE) injection 10 mg (has no administration in time range)  sodium chloride 0.9 % bolus 250 mL (0 mLs Intravenous Stopped 08/31/23 1141)  metoprolol tartrate (LOPRESSOR) injection 5 mg (5 mg Intravenous Given 08/31/23 1110)     The patient appears reasonably screen and/or stabilized for discharge and I doubt any other medical condition or other [facility] requiring further screening, evaluation, or treatment in the ED at this time prior to discharge.          Final Clinical Impression(s) / ED Diagnoses Final diagnoses:  Typical atrial flutter (HCC)    Rx / DC Orders ED Discharge Orders          Ordered    Ambulatory referral to Cardiology       Comments: If you have not heard from the Cardiology office within the next 72 hours please call [phone].   08/31/23 1316              Melene Plan, DO 08/31/23 1317

## 2023-08-31 NOTE — Discharge Instructions (Signed)
Follow-up with your cardiologist in the office.  Please return for recurrent palpitations.

## 2023-08-31 NOTE — ED Notes (Addendum)
Pt discharged to home using teachback Method. Discharge instructions have been discussed with patient and/or family members. Pt verbally acknowledges understanding d/c instructions, has been given opportunity for questions to be answered, and endorses comprehension to checkout at registration before leaving. Pt a/o at the time of discharge. RR even and unlabored. No acute distress noted. Wife is taking patient home. Aldrete score 10

## 2023-08-31 NOTE — ED Notes (Signed)
RT Note: RT at head of bed for Cardioversion at 1233. Ambu bag, suction, and CO 2 monitor set up for procedure with patient on 2lpm [place].  Patient HR at start 139, RR 16, SPO2 97% RA.   Patient post procedure,HR 83, RR 18,SPO2 99% on 2lpm  CO 2 nasal cannula . Patient tolerated the procedure well

## 2023-08-31 NOTE — ED Notes (Signed)
ED Provider at bedside. 

## 2023-08-31 NOTE — ED Triage Notes (Signed)
Pt states " I feel like Im in afib, my watch alerted me. PT reports tachycardia and shob x 2 days. Pt denies CP

## 2023-09-01 DIAGNOSIS — I83009 Varicose veins of unspecified lower extremity with ulcer of unspecified site: Secondary | ICD-10-CM | POA: Diagnosis not present

## 2023-09-01 DIAGNOSIS — I872 Venous insufficiency (chronic) (peripheral): Secondary | ICD-10-CM | POA: Diagnosis not present

## 2023-09-01 DIAGNOSIS — F17290 Nicotine dependence, other tobacco product, uncomplicated: Secondary | ICD-10-CM | POA: Diagnosis not present

## 2023-09-01 DIAGNOSIS — Z79899 Other long term (current) drug therapy: Secondary | ICD-10-CM | POA: Diagnosis not present

## 2023-09-01 DIAGNOSIS — L97322 Non-pressure chronic ulcer of left ankle with fat layer exposed: Secondary | ICD-10-CM | POA: Diagnosis not present

## 2023-09-01 DIAGNOSIS — L97909 Non-pressure chronic ulcer of unspecified part of unspecified lower leg with unspecified severity: Secondary | ICD-10-CM | POA: Diagnosis not present

## 2023-09-01 DIAGNOSIS — L97812 Non-pressure chronic ulcer of other part of right lower leg with fat layer exposed: Secondary | ICD-10-CM | POA: Diagnosis not present

## 2023-09-02 ENCOUNTER — Other Ambulatory Visit: Payer: Self-pay | Admitting: Cardiovascular Disease

## 2023-09-05 ENCOUNTER — Other Ambulatory Visit: Payer: Self-pay | Admitting: Cardiovascular Disease

## 2023-09-06 DIAGNOSIS — M1712 Unilateral primary osteoarthritis, left knee: Secondary | ICD-10-CM | POA: Diagnosis not present

## 2023-09-06 DIAGNOSIS — G8929 Other chronic pain: Secondary | ICD-10-CM | POA: Diagnosis not present

## 2023-09-08 DIAGNOSIS — L97909 Non-pressure chronic ulcer of unspecified part of unspecified lower leg with unspecified severity: Secondary | ICD-10-CM | POA: Diagnosis not present

## 2023-09-08 DIAGNOSIS — F17290 Nicotine dependence, other tobacco product, uncomplicated: Secondary | ICD-10-CM | POA: Diagnosis not present

## 2023-09-08 DIAGNOSIS — Z79899 Other long term (current) drug therapy: Secondary | ICD-10-CM | POA: Diagnosis not present

## 2023-09-08 DIAGNOSIS — L97812 Non-pressure chronic ulcer of other part of right lower leg with fat layer exposed: Secondary | ICD-10-CM | POA: Diagnosis not present

## 2023-09-08 DIAGNOSIS — I83009 Varicose veins of unspecified lower extremity with ulcer of unspecified site: Secondary | ICD-10-CM | POA: Diagnosis not present

## 2023-09-08 DIAGNOSIS — L97322 Non-pressure chronic ulcer of left ankle with fat layer exposed: Secondary | ICD-10-CM | POA: Diagnosis not present

## 2023-09-08 DIAGNOSIS — I872 Venous insufficiency (chronic) (peripheral): Secondary | ICD-10-CM | POA: Diagnosis not present

## 2023-09-08 NOTE — ED Notes (Signed)
Charting update

## 2023-09-08 NOTE — ED Notes (Signed)
Late entry: Aldrete score 7 immed.  post procedure

## 2023-09-08 NOTE — ED Notes (Signed)
Late entry: This RN witnessed pt signature for consent. EDP Adela Lank was not able to sign d/t sig pad error

## 2023-09-09 DIAGNOSIS — G4733 Obstructive sleep apnea (adult) (pediatric): Secondary | ICD-10-CM | POA: Diagnosis not present

## 2023-09-09 DIAGNOSIS — I739 Peripheral vascular disease, unspecified: Secondary | ICD-10-CM | POA: Diagnosis not present

## 2023-09-09 DIAGNOSIS — M1712 Unilateral primary osteoarthritis, left knee: Secondary | ICD-10-CM | POA: Diagnosis not present

## 2023-09-09 DIAGNOSIS — F419 Anxiety disorder, unspecified: Secondary | ICD-10-CM | POA: Diagnosis not present

## 2023-09-09 DIAGNOSIS — I25810 Atherosclerosis of coronary artery bypass graft(s) without angina pectoris: Secondary | ICD-10-CM | POA: Diagnosis not present

## 2023-09-09 DIAGNOSIS — I480 Paroxysmal atrial fibrillation: Secondary | ICD-10-CM | POA: Diagnosis not present

## 2023-09-09 DIAGNOSIS — E1151 Type 2 diabetes mellitus with diabetic peripheral angiopathy without gangrene: Secondary | ICD-10-CM | POA: Diagnosis not present

## 2023-09-09 DIAGNOSIS — I10 Essential (primary) hypertension: Secondary | ICD-10-CM | POA: Diagnosis not present

## 2023-09-23 DIAGNOSIS — M170 Bilateral primary osteoarthritis of knee: Secondary | ICD-10-CM | POA: Diagnosis not present

## 2023-09-29 DIAGNOSIS — L97322 Non-pressure chronic ulcer of left ankle with fat layer exposed: Secondary | ICD-10-CM | POA: Diagnosis not present

## 2023-09-29 DIAGNOSIS — R600 Localized edema: Secondary | ICD-10-CM | POA: Diagnosis not present

## 2023-09-29 DIAGNOSIS — Z79899 Other long term (current) drug therapy: Secondary | ICD-10-CM | POA: Diagnosis not present

## 2023-09-29 DIAGNOSIS — I83009 Varicose veins of unspecified lower extremity with ulcer of unspecified site: Secondary | ICD-10-CM | POA: Diagnosis not present

## 2023-09-29 DIAGNOSIS — Z87891 Personal history of nicotine dependence: Secondary | ICD-10-CM | POA: Diagnosis not present

## 2023-09-29 DIAGNOSIS — I872 Venous insufficiency (chronic) (peripheral): Secondary | ICD-10-CM | POA: Diagnosis not present

## 2023-09-29 DIAGNOSIS — L97909 Non-pressure chronic ulcer of unspecified part of unspecified lower leg with unspecified severity: Secondary | ICD-10-CM | POA: Diagnosis not present

## 2023-09-29 DIAGNOSIS — L97812 Non-pressure chronic ulcer of other part of right lower leg with fat layer exposed: Secondary | ICD-10-CM | POA: Diagnosis not present

## 2023-09-29 NOTE — Progress Notes (Deleted)
   Cardiology Office Note    Date:  09/29/2023  ID:  ROMIE TAY, DOB Jun 15, 1951, MRN [id number] PCP:  Yolande Toribio [name], MD  Cardiologist:  Dorn Lesches, MD  Electrophysiologist:  None   Chief Complaint: ***  History of Present Illness: .    UDELL MAZZOCCO is a 73 y.o. male with visit-pertinent history of ***  Labwork independently reviewed:   ROS: .    Please see the history of present illness. Otherwise, review of systems is positive for ***.  All other systems are reviewed and otherwise negative.  Studies Reviewed: SABRA    EKG:  EKG is ordered today, personally reviewed, demonstrating ***  CV Studies: Cardiac studies reviewed are outlined and summarized above. Otherwise please see EMR for full report.   Current Reported Medications:.    No outpatient medications have been marked as taking for the 10/01/23 encounter (Appointment) with Jodi Kappes D, NP.    Physical Exam:    VS:  There were no vitals taken for this visit.   Wt Readings from Last 3 Encounters:  08/31/23 300 lb (136.1 kg)  05/20/23 (!) 323 lb 3.2 oz (146.6 kg)  11/10/22 (!) 309 lb (140.2 kg)    GEN: Well nourished, well developed in no acute distress NECK: No JVD; No carotid bruits CARDIAC: ***RRR, no murmurs, rubs, gallops RESPIRATORY:  Clear to auscultation without rales, wheezing or rhonchi  ABDOMEN: Soft, non-tender, non-distended EXTREMITIES:  No edema; No acute deformity   Asessement and Plan:.     ***     Disposition: F/u with ***  Signed, Yerania Chamorro D Evany Schecter, NP

## 2023-10-01 ENCOUNTER — Ambulatory Visit: Payer: BLUE CROSS/BLUE SHIELD | Admitting: Cardiology

## 2023-10-02 NOTE — Progress Notes (Deleted)
   Cardiology Office Note    Date:  10/02/2023  ID:  David Frost, DOB 10/30/1950, MRN [id number] PCP:  David Toribio [name], MD  Cardiologist:  David Lesches, MD  Electrophysiologist:  None   Chief Complaint: ***  History of Present Illness: .    David Frost is a 73 y.o. male with visit-pertinent history of ***  Labwork independently reviewed:   ROS: .    Please see the history of present illness. Otherwise, review of systems is positive for ***.  All other systems are reviewed and otherwise negative.  Studies Reviewed: SABRA    EKG:  EKG is ordered today, personally reviewed, demonstrating ***  CV Studies: Cardiac studies reviewed are outlined and summarized above. Otherwise please see EMR for full report.   Current Reported Medications:.    No outpatient medications have been marked as taking for the 10/05/23 encounter (Appointment) with David Wanat D, NP.    Physical Exam:    VS:  There were no vitals taken for this visit.   Wt Readings from Last 3 Encounters:  08/31/23 300 lb (136.1 kg)  05/20/23 (!) 323 lb 3.2 oz (146.6 kg)  11/10/22 (!) 309 lb (140.2 kg)    GEN: Well nourished, well developed in no acute distress NECK: No JVD; No carotid bruits CARDIAC: ***RRR, no murmurs, rubs, gallops RESPIRATORY:  Clear to auscultation without rales, wheezing or rhonchi  ABDOMEN: Soft, non-tender, non-distended EXTREMITIES:  No edema; No acute deformity   Asessement and Plan:.     ***     Disposition: F/u with ***  Signed, David Ganus Frost Eliseo Withers, NP

## 2023-10-05 ENCOUNTER — Ambulatory Visit: Payer: BLUE CROSS/BLUE SHIELD | Admitting: Cardiology

## 2023-10-06 DIAGNOSIS — R600 Localized edema: Secondary | ICD-10-CM | POA: Diagnosis not present

## 2023-10-06 DIAGNOSIS — I83028 Varicose veins of left lower extremity with ulcer other part of lower leg: Secondary | ICD-10-CM | POA: Diagnosis not present

## 2023-10-06 DIAGNOSIS — L97322 Non-pressure chronic ulcer of left ankle with fat layer exposed: Secondary | ICD-10-CM | POA: Diagnosis not present

## 2023-10-06 DIAGNOSIS — I872 Venous insufficiency (chronic) (peripheral): Secondary | ICD-10-CM | POA: Diagnosis not present

## 2023-10-06 DIAGNOSIS — L97812 Non-pressure chronic ulcer of other part of right lower leg with fat layer exposed: Secondary | ICD-10-CM | POA: Diagnosis not present

## 2023-10-06 DIAGNOSIS — Z79899 Other long term (current) drug therapy: Secondary | ICD-10-CM | POA: Diagnosis not present

## 2023-10-06 DIAGNOSIS — L97822 Non-pressure chronic ulcer of other part of left lower leg with fat layer exposed: Secondary | ICD-10-CM | POA: Diagnosis not present

## 2023-10-06 DIAGNOSIS — I83018 Varicose veins of right lower extremity with ulcer other part of lower leg: Secondary | ICD-10-CM | POA: Diagnosis not present

## 2023-10-06 DIAGNOSIS — Z87891 Personal history of nicotine dependence: Secondary | ICD-10-CM | POA: Diagnosis not present

## 2023-10-09 NOTE — Progress Notes (Unsigned)
Cardiology Office Note    Date:  10/11/2023  ID:  David Frost 11-02-1950, MRN [id number] PCP:  Garlan Fillers, MD  Cardiologist:  Nanetta Batty, MD  Electrophysiologist:  None   Chief Complaint: Atrial flutter  History of Present Illness: .    David Frost is a 73 y.o. male with visit-pertinent history of PAF s/p DCCV, CAD s/p CABG in 03/2017, hypertension, hyperlipidemia, DM type II, GAD and OSA on CPAP, obesity.  In 02/2017 patient had an NSTEMI in [place], [place] and was transferred to [place], [place] where he underwent cardiac catheterization severe multivessel disease.  He was transferred to [place] and underwent CABG x 6 with Dr. Tyrone Sage on 03/22/2017.  Following discharge he was readmitted with A-fib with RVR that was symptomatic.  He was placed on amiodarone and oral anticoagulation.  He underwent outpatient cardioversion on 05/04/2017 with successful conversion to sinus rhythm.  Per Dr. Hazle Coca notes he was seen for an additional episode of A-fib with RVR complicated by hypotension, he converted without electrical cardioversion.  [facility] was last seen in clinic on 10/21/2022 by Dr. Allyson Sabal and was doing well.  On 05/18/2023 he presented to med [facility] as his watch notified him that he was in atrial fibrillation.  He was asymptomatic, initial EKG showed 2:1 atrial flutter with VR at 128 bpm with RBBB.  He was admitted due to chest tightness and 3 days of shortness of breath.  On heparin drip and Cardizem drip.  Not a DCCV candidate as he was not on oral anticoagulation outpatient and was unknown start time.  He was transferred admitted to [facility] for further workup.  Patient reported that he had been using his inhaler several times a day, prescribed to use once a day for congestion.  On 05/19/2023 he underwent successful TEE DCCV with restoration of sinus rhythm.  He was found to be in acute on chronic HFpEF, diuresed with IV Lasix.  He was discharged  on oral Lasix.  On chart review patient presented to the ED on 08/31/2019 for with complaints his heart was racing.  His initial EKG was reported as sinus tachycardia was questioned it was if it was atrial flutter with variable block.  Patient underwent bedside cardioversion, felt to have converted to sinus rhythm.  It was recommended that he have close follow-up with cardiology and with the atrial fibrillation clinic.  Today he presents for follow up. He reports that he has been doing well. he reports that his Apple Watch has not notified him of any further atrial fibrillation or flutter.  He recently joined [place] [facility] and has been exercising there 3 days a week as well as using his stationary bike at home, he tolerates this very well, denies any chest pain or shortness of breath.  He reports ongoing bilateral lower extremity edema, his primary care has started him on a Lasix 40 mg twice daily with a potassium supplement.  Patient reports improvement in his lower extremity edema, he reports he also wears compression stockings nearly all day with improvement.  Patient notes that he was supposed to be referred to A-fib clinic after his second cardioversion.  Labwork independently reviewed: 09/09/2023: Creatinine 1.2, sodium 137, potassium 4.4, AST 13, ALT 23  ROS: .   Today he denies chest pain, shortness of breath, fatigue, palpitations, melena, hematuria, hemoptysis, diaphoresis, weakness, presyncope, syncope, orthopnea, and PND.  All other systems are reviewed and otherwise negative. Studies Reviewed: .  EKG:  EKG is ordered today, personally reviewed, demonstrating  EKG Interpretation Date/Time:  Monday October 11 2023 10:55:24 EST Ventricular Rate:  99 PR Interval:  248 QRS Duration:  154 QT Interval:  370 QTC Calculation: 474 R Axis:   -79  Text Interpretation: Sinus rhythm with 1st degree A-V block with Premature atrial complexes with Abberant conduction and premature ventricular  contraction Left axis deviation Right bundle branch block Confirmed by Reather Littler [phone]) on 10/11/2023 12:51:57 PM   CV Studies:  Cardiac Studies & Procedures     STRESS TESTS  MYOCARDIAL PERFUSION IMAGING 08/09/2006  ECHOCARDIOGRAM  ECHOCARDIOGRAM COMPLETE 05/18/2023  Narrative ECHOCARDIOGRAM REPORT    Patient Name:   David Frost Date of Exam: 05/18/2023 Medical Rec #:  [id number]       Height:       72.0 in Accession #:    [id number]      Weight:       326.0 lb Date of Birth:  08-04-1951       BSA:          2.622 m Patient Age:    72 years        BP:           101/86 mmHg Patient Gender: M               HR:           95 bpm. Exam Location:  Inpatient  Procedure: 2D Echo, Cardiac Doppler and Color Doppler  Indications:    Atrial Flutter I48.92  History:        Patient has prior history of Echocardiogram examinations, most recent 03/22/2017. Previous Myocardial Infarction, Prior CABG, Arrythmias:Atrial Flutter, Atrial Fibrillation and RBBB; Risk Factors:Dyslipidemia, Hypertension and Former Smoker.  Sonographer:    Dondra Prader RVT RCS Referring Phys: [id number] Angie Fava   Sonographer Comments: Technically difficult study due to poor echo windows, suboptimal subcostal window, suboptimal apical window, suboptimal parasternal window and patient is obese. Image acquisition challenging due to patient body habitus and Unable to lie still for exam. Unable to get accurate measurements due to elevated HR and A-fib. 134 BPM IMPRESSIONS   1. Left ventricular ejection fraction, by estimation, is 65 to 70%. The left ventricle has normal function. Left ventricular endocardial border not optimally defined to evaluate regional wall motion. Left ventricular diastolic function could not be evaluated. 2. Right ventricular systolic function was not well visualized. The right ventricular size is not well visualized. 3. Left atrial size was mildly dilated. 4. Right atrial size was  mildly dilated. 5. The mitral valve is abnormal. No evidence of mitral valve regurgitation. No evidence of mitral stenosis. 6. The aortic valve was not well visualized. Aortic valve regurgitation is not visualized. 7. Aortic dilatation noted. 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 8. The inferior vena cava is dilated in size with >50% respiratory variability, suggesting right atrial pressure of 8 mmHg.  Comparison(s): Similar to 2018 study.  FINDINGS Left Ventricle: Left ventricular ejection fraction, by estimation, is 65 to 70%. The left ventricle has normal function. Left ventricular endocardial border not optimally defined to evaluate regional wall motion. The left ventricular internal cavity size was small. Suboptimal image quality limits for assessment of left ventricular hypertrophy. Left ventricular diastolic function could not be evaluated due to atrial fibrillation. Left ventricular diastolic function could not be evaluated.  Right Ventricle: The right ventricular size is not well visualized. No increase in right  ventricular wall thickness. Right ventricular systolic function was not well visualized.  Left Atrium: Left atrial size was mildly dilated.  Right Atrium: Right atrial size was mildly dilated.  Pericardium: Trivial pericardial effusion is present. The pericardial effusion is surrounding the apex.  Mitral Valve: The mitral valve is abnormal. No evidence of mitral valve regurgitation. No evidence of mitral valve stenosis.  Tricuspid Valve: The tricuspid valve is not well visualized. Tricuspid valve regurgitation is not demonstrated. No evidence of tricuspid stenosis.  Aortic Valve: The aortic valve was not well visualized. Aortic valve regurgitation is not visualized. Aortic valve mean gradient measures 11.0 mmHg. Aortic valve peak gradient measures 16.4 mmHg. Aortic valve area, by VTI measures 2.81 cm.  Pulmonic Valve: The pulmonic valve was not  well visualized. Pulmonic valve regurgitation is not visualized. No evidence of pulmonic stenosis.  Aorta: Aortic dilatation noted. 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  Venous: The inferior vena cava is dilated in size with greater than 50% respiratory variability, suggesting right atrial pressure of 8 mmHg.  IAS/Shunts: No atrial level shunt detected by color flow Doppler.   LEFT VENTRICLE PLAX 2D LVOT diam:     2.30 cm LV SV:         73 LV SV Index:   28 LVOT Area:     4.15 cm   IVC IVC diam: 2.40 cm  AORTIC VALVE                     PULMONIC VALVE AV Area (Vmax):    2.77 cm      PV Vmax:       1.15 m/s AV Area (Vmean):   2.64 cm      PV Peak grad:  5.3 mmHg AV Area (VTI):     2.81 cm AV Vmax:           202.67 cm/s AV Vmean:          132.667 cm/s AV VTI:            0.258 m AV Peak Grad:      16.4 mmHg AV Mean Grad:      11.0 mmHg LVOT Vmax:         135.00 cm/s LVOT Vmean:        84.300 cm/s LVOT VTI:          0.175 m LVOT/AV VTI ratio: 0.68  AORTA Ao Root diam: 3.60 cm Ao Asc diam:  4.40 cm   SHUNTS Systemic VTI:  0.18 m Systemic Diam: 2.30 cm  Riley Lam MD Electronically signed by Riley Lam MD Signature Date/Time: 05/18/2023/1:24:31 PM    Final  TEE  ECHO TEE 05/19/2023  Narrative TRANSESOPHOGEAL ECHO REPORT    Patient Name:   David Frost Date of Exam: 05/19/2023 Medical Rec #:  [id number]       Height:       72.0 in Accession #:    [id number]      Weight:       322.5 lb Date of Birth:  1950-10-07       BSA:          2.610 m Patient Age:    72 years        BP:           138/96 mmHg Patient Gender: M               HR:  108 bpm. Exam Location:  Inpatient  Procedure: Transesophageal Echo, Color Doppler and Cardiac Doppler  Indications:     Atrial flutter  History:         Patient has prior history of Echocardiogram examinations, most recent 05/18/2023. CAD, Prior CABG,  Arrythmias:RBBB, Atrial Flutter and Atrial Fibrillation; Risk Factors:Hypertension, Dyslipidemia, Diabetes and Sleep Apnea.  Sonographer:     Delcie Roch RDCS Referring Phys:  [id number] BRIDGETTE CHRISTOPHER Diagnosing Phys: Jodelle Red MD  PROCEDURE: After discussion of the risks and benefits of a TEE, an informed consent was obtained from the patient. The transesophogeal probe was passed without difficulty through the esophogus of the patient. Imaged were obtained with the patient in a left lateral decubitus position. Sedation performed by different physician. The patient was monitored while under deep sedation. Anesthestetic sedation was provided intravenously by Anesthesiology: 316mg  of Propofol, 100mg  of Lidocaine. The patient's vital signs; including heart rate, blood pressure, and oxygen saturation; remained stable throughout the procedure. The patient developed no complications during the procedure. A successful direct current cardioversion was performed at 150 joules with 1 attempt.  IMPRESSIONS   1. Left ventricular ejection fraction, by estimation, is 60 to 65%. The left ventricle has normal function. 2. Right ventricular systolic function is normal. The right ventricular size is normal. 3. Left atrial size was mildly dilated. No left atrial/left atrial appendage thrombus was detected. 4. Right atrial size was mildly dilated. 5. The mitral valve is normal in structure. Trivial mitral valve regurgitation. No evidence of mitral stenosis. 6. The aortic valve is tricuspid. Aortic valve regurgitation is trivial. No aortic stenosis is present. 7. Aortic dilatation noted. There is mild dilatation of the ascending aorta, measuring 40 mm. There is Moderate (Grade III) plaque involving the descending aorta.  Conclusion(s)/Recommendation(s): No LA/LAA thrombus identified. Successful cardioversion performed with restoration of normal sinus rhythm.  FINDINGS Left Ventricle:  Left ventricular ejection fraction, by estimation, is 60 to 65%. The left ventricle has normal function. The left ventricular internal cavity size was normal in size.  Right Ventricle: The right ventricular size is normal. No increase in right ventricular wall thickness. Right ventricular systolic function is normal.  Left Atrium: Left atrial size was mildly dilated. No left atrial/left atrial appendage thrombus was detected.  Right Atrium: Right atrial size was mildly dilated.  Pericardium: There is no evidence of pericardial effusion.  Mitral Valve: The mitral valve is normal in structure. Trivial mitral valve regurgitation. No evidence of mitral valve stenosis. There is no evidence of mitral valve vegetation.  Tricuspid Valve: The tricuspid valve is normal in structure. Tricuspid valve regurgitation is trivial. No evidence of tricuspid stenosis. There is no evidence of tricuspid valve vegetation.  Aortic Valve: The aortic valve is tricuspid. Aortic valve regurgitation is trivial. No aortic stenosis is present. There is no evidence of aortic valve vegetation.  Pulmonic Valve: The pulmonic valve was grossly normal. Pulmonic valve regurgitation is trivial. No evidence of pulmonic stenosis. There is no evidence of pulmonic valve vegetation.  Aorta: Aortic dilatation noted. There is mild dilatation of the ascending aorta, measuring 40 mm. There is moderate (Grade III) plaque involving the descending aorta.  IAS/Shunts: The interatrial septum appears to be lipomatous. No atrial level shunt detected by color flow Doppler.  Additional Comments: Spectral Doppler performed.  Jodelle Red MD Electronically signed by Jodelle Red MD Signature Date/Time: 05/20/2023/1:30:02 PM    Final            Current Reported Medications:.  Current Meds  Medication Sig   acetaminophen (TYLENOL) 325 MG tablet Take 2 tablets (650 mg total) by mouth every 6 (six) hours as needed for mild  pain.   albuterol (VENTOLIN HFA) 108 (90 Base) MCG/ACT inhaler Inhale 2 puffs into the lungs every 6 (six) hours as needed for wheezing or shortness of breath.   ALPRAZolam (XANAX) 0.25 MG tablet Take 1 tablet (0.25 mg total) by mouth 2 (two) times daily as needed for anxiety.   apixaban (ELIQUIS) 5 MG TABS tablet Take 1 tablet (5 mg total) by mouth 2 (two) times daily.   atorvastatin (LIPITOR) 40 MG tablet TAKE 1 TABLET(40 MG) BY MOUTH DAILY   bimatoprost (LUMIGAN) 0.01 % SOLN Lumigan 0.01 % eye drops  INSTILL 1 DROP INTO LEFT EYE EVERY NIGHT AT BEDTIME   Cholecalciferol 1.25 MG (50000 UT) capsule Take 50,000 Units by mouth once a week.   diltiazem (CARDIZEM CD) 360 MG 24 hr capsule Take 1 capsule (360 mg total) by mouth daily.   diltiazem (CARDIZEM LA) 360 MG 24 hr tablet Take 360 mg by mouth daily.   fluticasone (FLONASE) 50 MCG/ACT nasal spray Place 1 spray into both nostrils daily.   furosemide (LASIX) 40 MG tablet Take 40 mg by mouth.   icosapent Ethyl (VASCEPA) 1 g capsule TAKE 2 CAPSULES(2 GRAMS) BY MOUTH TWICE DAILY   JARDIANCE 25 MG TABS tablet Take 25 mg by mouth daily.   loratadine (CLARITIN) 10 MG tablet Take 10 mg by mouth daily.   metFORMIN (GLUCOPHAGE-XR) 500 MG 24 hr tablet Take 500 mg by mouth every evening. With meal.   Multiple Vitamins-Minerals (PRESERVISION AREDS 2 PO) Take 1 capsule by mouth 2 (two) times daily.   mupirocin ointment (BACTROBAN) 2 % Apply 1 Application topically 3 (three) times daily. For 7 days.   nitroGLYCERIN (NITROSTAT) 0.4 MG SL tablet PLACE 1 TABLET UNDER THE TONGUE EVERY 5 MINUTES AS NEEDED FOR CHEST PAIN   olmesartan (BENICAR) 40 MG tablet Take 1 tablet (40 mg total) by mouth daily.   potassium chloride (KLOR-CON M) 10 MEQ tablet Take 10 mEq by mouth daily.   SYMBICORT 160-4.5 MCG/ACT inhaler Inhale 2 puffs into the lungs in the morning and at bedtime. Not to be taken more frequently than twice daily.   tirzepatide [facility]) 2.5 MG/0.5ML Pen  Inject 2.5 mg into the skin once a week.   triamcinolone cream (KENALOG) 0.1 % Apply 1 Application topically 2 (two) times daily as needed (Redness).   [DISCONTINUED] aspirin EC 81 MG tablet Take 1 tablet (81 mg total) by mouth daily.   [DISCONTINUED] furosemide (LASIX) 40 MG tablet Take 1 tablet (40 mg total) by mouth 2 (two) times daily.   Physical Exam:    VS:  BP 130/60 (BP Location: Left Arm, Patient Position: Sitting, Cuff Size: Large)   Pulse 86   Ht 6' (1.829 m)   Wt (!) 316 lb (143.3 kg)   BMI 42.86 kg/m    Wt Readings from Last 3 Encounters:  10/11/23 (!) 316 lb (143.3 kg)  08/31/23 300 lb (136.1 kg)  05/20/23 (!) 323 lb 3.2 oz (146.6 kg)    GEN: Well nourished, well developed in no acute distress NECK: No JVD; No carotid bruits CARDIAC: RRR, no murmurs, rubs, gallops RESPIRATORY:  Clear to auscultation without rales, wheezing or rhonchi  ABDOMEN: Soft, non-tender, non-distended EXTREMITIES:  nonpitting bilateral lower extremity edema; No acute deformity   Asessement and Plan:.    PAF/Atrial flutter with RVR: Patient  with known history of paroxysmal atrial fibrillation, admitted in 04/2023 with rapid atrial flutter.  Echo showed normal LV function.  He underwent successful TEE/DCCV on 05/19/2023 with restoration of sinus rhythm. EKG today indicates sinus rhythm with first-degree AV block, PACs and PVCs with right bundle branch block.  Reviewed with Dr. Royann Shivers. Today he denies any palpitations or feelings of atrial fibrillation.  He denies any bleeding problems on Eliquis, he has noted some increased bruising.  Patient notes that he was to be referred to A-fib clinic, will send referral as he would like to establish with them. Discussed wearing a cardiac monitor to determine any burden, patient deferred, favoring monitoring on his watch. Reviewed ED precautions. Continue Eliquis 5 mg twice daily and Cardizem 360 mg daily. CHA2DS2-VASc Score = 4 [CHF History: 0, HTN History: 1,  Diabetes History: 1, Stroke History: 0, Vascular Disease History: 1, Age Score: 1, Gender Score: 0].  Therefore, the patient's annual risk of stroke is 4.8 %.      HFpEF: Echo during admission indicated LVEF of 65 to 70%.  BNP was elevated at 500, chest x-ray showed no edema.  Patient has history of lower extremity edema with chronic weeping of legs.  He has been continued on the Lasix 40 mg twice daily with a potassium supplement by his PCP.  Today he reports that his lower extremity edema is improving, he still continues to wear compression stocks daily.  He denies shortness of breath, orthopnea or PND.  Continue Lasix 40 mg twice daily and Jardiance 25 mg daily.  CAD: s/p CABG x 6 in 2018. Stable with no anginal symptoms. No indication for ischemic evaluation. Heart healthy diet and regular cardiovascular exercise encouraged. Reviewed ED precautions. No longer on ASA given need for Eliquis.  Continue atorvastatin 40 mg daily, Cardizem, Jardiance, Vascepa, Benicar.  HTN: Blood pressure today 130/60.  Continue current antihypertensive regimen.   HLD: Continue Lipitor 40 mg daily. Refill of Vascepa provided.   DM: Last hemoglobin A1c 5.9 on 09/09/2023.  Monitor managed per PCP.  OSA: Patient reports nightly CPAP adherence aside from the last week, notes he is recovering from recent illness and has had increased congestion.   ETOH abuse: Currently drinking a bottle of wine a night, encouraged reduction.     Disposition: F/u with Dr. Allyson Sabal in three months.   Signed, Rip Harbour, NP

## 2023-10-10 ENCOUNTER — Other Ambulatory Visit: Payer: Self-pay | Admitting: Cardiovascular Disease

## 2023-10-11 ENCOUNTER — Ambulatory Visit: Payer: Medicare Other | Attending: Cardiology | Admitting: Cardiology

## 2023-10-11 ENCOUNTER — Encounter: Payer: Self-pay | Admitting: Cardiology

## 2023-10-11 VITALS — BP 130/60 | HR 86 | Ht 72.0 in | Wt 316.0 lb

## 2023-10-11 DIAGNOSIS — I4892 Unspecified atrial flutter: Secondary | ICD-10-CM | POA: Diagnosis not present

## 2023-10-11 DIAGNOSIS — I10 Essential (primary) hypertension: Secondary | ICD-10-CM | POA: Diagnosis not present

## 2023-10-11 DIAGNOSIS — I480 Paroxysmal atrial fibrillation: Secondary | ICD-10-CM | POA: Diagnosis not present

## 2023-10-11 DIAGNOSIS — G4733 Obstructive sleep apnea (adult) (pediatric): Secondary | ICD-10-CM | POA: Diagnosis not present

## 2023-10-11 DIAGNOSIS — E782 Mixed hyperlipidemia: Secondary | ICD-10-CM | POA: Diagnosis not present

## 2023-10-11 DIAGNOSIS — I4510 Unspecified right bundle-branch block: Secondary | ICD-10-CM | POA: Diagnosis not present

## 2023-10-11 DIAGNOSIS — I2510 Atherosclerotic heart disease of native coronary artery without angina pectoris: Secondary | ICD-10-CM | POA: Diagnosis not present

## 2023-10-11 NOTE — Patient Instructions (Signed)
Medication Instructions:  No changes *If you need a refill on your cardiac medications before your next appointment, please call your pharmacy*  Lab Work: No labs  Testing/Procedures: No testing  Follow-Up: At [facility], you and your health needs are our priority.  As part of our continuing mission to provide you with exceptional heart care, we have created designated Provider Care Teams.  These Care Teams include your primary Cardiologist (physician) and Advanced Practice Providers (APPs -  Physician Assistants and Nurse Practitioners) who all work together to provide you with the care you need, when you need it.  We recommend signing up for the patient portal called "MyChart".  Sign up information is provided on this After Visit Summary.  MyChart is used to connect with patients for Virtual Visits (Telemedicine).  Patients are able to view lab/test results, encounter notes, upcoming appointments, etc.  Non-urgent messages can be sent to your provider as well.   To learn more about what you can do with MyChart, go to [email or web address].    Your next appointment:   3 month(s)  Provider:   Nanetta Batty, MD   Other Instructions We are going to refer you to A fib clinic

## 2023-10-13 DIAGNOSIS — I872 Venous insufficiency (chronic) (peripheral): Secondary | ICD-10-CM | POA: Diagnosis not present

## 2023-10-13 DIAGNOSIS — L97322 Non-pressure chronic ulcer of left ankle with fat layer exposed: Secondary | ICD-10-CM | POA: Diagnosis not present

## 2023-10-13 DIAGNOSIS — Z87891 Personal history of nicotine dependence: Secondary | ICD-10-CM | POA: Diagnosis not present

## 2023-10-13 DIAGNOSIS — L97909 Non-pressure chronic ulcer of unspecified part of unspecified lower leg with unspecified severity: Secondary | ICD-10-CM | POA: Diagnosis not present

## 2023-10-13 DIAGNOSIS — I83009 Varicose veins of unspecified lower extremity with ulcer of unspecified site: Secondary | ICD-10-CM | POA: Diagnosis not present

## 2023-10-13 DIAGNOSIS — R600 Localized edema: Secondary | ICD-10-CM | POA: Diagnosis not present

## 2023-10-13 DIAGNOSIS — L97812 Non-pressure chronic ulcer of other part of right lower leg with fat layer exposed: Secondary | ICD-10-CM | POA: Diagnosis not present

## 2023-10-13 DIAGNOSIS — Z79899 Other long term (current) drug therapy: Secondary | ICD-10-CM | POA: Diagnosis not present

## 2023-10-18 ENCOUNTER — Ambulatory Visit (HOSPITAL_COMMUNITY)
Admission: RE | Admit: 2023-10-18 | Discharge: 2023-10-18 | Disposition: A | Discharge status: Home / Self Care | Payer: Medicare Other | Source: Ambulatory Visit | Attending: Internal Medicine | Admitting: Internal Medicine

## 2023-10-18 ENCOUNTER — Encounter (HOSPITAL_COMMUNITY): Payer: Self-pay | Admitting: Internal Medicine

## 2023-10-18 VITALS — BP 128/78 | HR 110 | Ht 72.0 in | Wt 315.6 lb

## 2023-10-18 DIAGNOSIS — G4733 Obstructive sleep apnea (adult) (pediatric): Secondary | ICD-10-CM | POA: Insufficient documentation

## 2023-10-18 DIAGNOSIS — Z79899 Other long term (current) drug therapy: Secondary | ICD-10-CM | POA: Diagnosis not present

## 2023-10-18 DIAGNOSIS — I440 Atrioventricular block, first degree: Secondary | ICD-10-CM | POA: Diagnosis not present

## 2023-10-18 DIAGNOSIS — I480 Paroxysmal atrial fibrillation: Secondary | ICD-10-CM | POA: Insufficient documentation

## 2023-10-18 DIAGNOSIS — I110 Hypertensive heart disease with heart failure: Secondary | ICD-10-CM | POA: Insufficient documentation

## 2023-10-18 DIAGNOSIS — I4510 Unspecified right bundle-branch block: Secondary | ICD-10-CM | POA: Insufficient documentation

## 2023-10-18 DIAGNOSIS — Z951 Presence of aortocoronary bypass graft: Secondary | ICD-10-CM | POA: Diagnosis not present

## 2023-10-18 DIAGNOSIS — E785 Hyperlipidemia, unspecified: Secondary | ICD-10-CM | POA: Diagnosis not present

## 2023-10-18 DIAGNOSIS — I5030 Unspecified diastolic (congestive) heart failure: Secondary | ICD-10-CM | POA: Insufficient documentation

## 2023-10-18 DIAGNOSIS — I2510 Atherosclerotic heart disease of native coronary artery without angina pectoris: Secondary | ICD-10-CM | POA: Diagnosis not present

## 2023-10-18 DIAGNOSIS — E119 Type 2 diabetes mellitus without complications: Secondary | ICD-10-CM | POA: Diagnosis not present

## 2023-10-18 DIAGNOSIS — Z7984 Long term (current) use of oral hypoglycemic drugs: Secondary | ICD-10-CM | POA: Diagnosis not present

## 2023-10-18 DIAGNOSIS — I4892 Unspecified atrial flutter: Secondary | ICD-10-CM | POA: Diagnosis not present

## 2023-10-18 DIAGNOSIS — I4891 Unspecified atrial fibrillation: Secondary | ICD-10-CM

## 2023-10-18 DIAGNOSIS — D6869 Other thrombophilia: Secondary | ICD-10-CM | POA: Diagnosis not present

## 2023-10-18 DIAGNOSIS — Z7985 Long-term (current) use of injectable non-insulin antidiabetic drugs: Secondary | ICD-10-CM | POA: Diagnosis not present

## 2023-10-18 DIAGNOSIS — Z7901 Long term (current) use of anticoagulants: Secondary | ICD-10-CM | POA: Diagnosis not present

## 2023-10-18 MED ORDER — AMIODARONE HCL 200 MG PO TABS: ORAL_TABLET | ORAL | 3 refills | Status: DC

## 2023-10-18 NOTE — Progress Notes (Addendum)
Primary Care Physician: Garlan Fillers, MD Primary Cardiologist: Nanetta Batty, MD Electrophysiologist: None     Referring Physician: ED     David Frost is a 73 y.o. male with a history of CAD s/p CABG in 03/2017, HFpEF, HTN, HLD, T2DM, OSA on CPAP, obesity, and paroxysmal atrial fibrillation who presents for consultation in the [facility]. Recent ED visit for atrial flutter on 08/31/23 s/p successful DCCV. Patient is on Eliquis for a CHADS2VASC score of 5.  On evaluation today, he is currently in what appears to be atrial flutter with RVR. He does not have cardiac awareness. No missed doses of Eliquis. He is on diltiazem 360 mg daily. He notes he takes Mounjaro every Wednesday.   Today, he denies symptoms of palpitations, chest pain, shortness of breath, orthopnea, PND, lower extremity edema, dizziness, presyncope, syncope, snoring, daytime somnolence, bleeding, or neurologic sequela. The patient is tolerating medications without difficulties and is otherwise without complaint today.    Atrial Fibrillation Risk Factors:  he does have symptoms or diagnosis of sleep apnea. he is compliant with CPAP therapy.  he has a BMI of Body mass index is 42.8 kg/m.Marland Kitchen Filed Weights   10/18/23 0950  Weight: (!) 143.2 kg    Current Outpatient Medications  Medication Sig Dispense Refill   acetaminophen (TYLENOL) 325 MG tablet Take 2 tablets (650 mg total) by mouth every 6 (six) hours as needed for mild pain.     ALPRAZolam (XANAX) 0.25 MG tablet Take 1 tablet (0.25 mg total) by mouth 2 (two) times daily as needed for anxiety. 14 tablet 0   apixaban (ELIQUIS) 5 MG TABS tablet Take 1 tablet (5 mg total) by mouth 2 (two) times daily. 60 tablet 1   atorvastatin (LIPITOR) 40 MG tablet TAKE 1 TABLET(40 MG) BY MOUTH DAILY 90 tablet 0   bimatoprost (LUMIGAN) 0.01 % SOLN Lumigan 0.01 % eye drops  INSTILL 1 DROP INTO LEFT EYE EVERY NIGHT AT BEDTIME      Cholecalciferol 1.25 MG (50000 UT) capsule Take 50,000 Units by mouth once a week.     diltiazem (CARDIZEM CD) 360 MG 24 hr capsule Take 1 capsule (360 mg total) by mouth daily. 30 capsule 1   fluticasone (FLONASE) 50 MCG/ACT nasal spray Place 1 spray into both nostrils daily.     furosemide (LASIX) 40 MG tablet Take 40 mg by mouth.     icosapent Ethyl (VASCEPA) 1 g capsule TAKE 2 CAPSULES(2 GRAMS) BY MOUTH TWICE DAILY 360 capsule 3   JARDIANCE 25 MG TABS tablet Take 25 mg by mouth daily.     loratadine (CLARITIN) 10 MG tablet Take 10 mg by mouth daily.     metFORMIN (GLUCOPHAGE-XR) 500 MG 24 hr tablet Take 500 mg by mouth every evening. With meal.     MOUNJARO 7.5 MG/0.5ML Pen Inject 7.5 mg into the skin once a week.     Multiple Vitamins-Minerals (PRESERVISION AREDS 2 PO) Take 1 capsule by mouth 2 (two) times daily.     mupirocin ointment (BACTROBAN) 2 % Apply 1 Application topically 3 (three) times daily. For 7 days.     nitroGLYCERIN (NITROSTAT) 0.4 MG SL tablet PLACE 1 TABLET UNDER THE TONGUE EVERY 5 MINUTES AS NEEDED FOR CHEST PAIN 25 tablet 0   olmesartan (BENICAR) 40 MG tablet Take 1 tablet (40 mg total) by mouth daily. 30 tablet 1   potassium chloride (KLOR-CON M) 10 MEQ tablet Take 10 mEq by mouth daily.  SYMBICORT 160-4.5 MCG/ACT inhaler Inhale 2 puffs into the lungs in the morning and at bedtime. Not to be taken more frequently than twice daily.     triamcinolone cream (KENALOG) 0.1 % Apply 1 Application topically 2 (two) times daily as needed (Redness).     No current facility-administered medications for this encounter.    Atrial Fibrillation Management history:  Previous antiarrhythmic drugs: none Previous cardioversions: 04/2023, 08/2023 Previous ablations: none Anticoagulation history: Eliquis   ROS- All systems are reviewed and negative except as per the HPI above.  Physical Exam: BP 128/78   Pulse (!) 110   Ht 6' (1.829 m)   Wt (!) 143.2 kg   BMI 42.80 kg/m    GEN: Well nourished, well developed in no acute distress NECK: No JVD; No carotid bruits CARDIAC: Irregularly irregular rate and rhythm, no murmurs, rubs, gallops RESPIRATORY:  Clear to auscultation without rales, wheezing or rhonchi  ABDOMEN: Soft, non-tender, non-distended EXTREMITIES:  No edema; No deformity   EKG today demonstrates  Vent. rate 110 BPM PR interval * ms QRS duration 160 ms QT/QTcB 388/525 ms P-R-T axes * -87 56 Wide QRS rhythm Left axis deviation Right bundle branch block Abnormal ECG When compared with ECG of 11-Oct-2023 10:55, PREVIOUS ECG IS PRESENT  Echo 05/18/23 demonstrated   1. Left ventricular ejection fraction, by estimation, is 65 to 70%. The  left ventricle has normal function. Left ventricular endocardial border  not optimally defined to evaluate regional wall motion. Left ventricular  diastolic function could not be  evaluated.   2. Right ventricular systolic function was not well visualized. The right  ventricular size is not well visualized.   3. Left atrial size was mildly dilated.   4. Right atrial size was mildly dilated.   5. The mitral valve is abnormal. No evidence of mitral valve  regurgitation. No evidence of mitral stenosis.   6. The aortic valve was not well visualized. Aortic valve regurgitation  is not visualized.   7. Aortic dilatation noted. There is mild dilatation of the ascending  aorta, measuring 44 mm.   8. The inferior vena cava is dilated in size with >50% respiratory  variability, suggesting right atrial pressure of 8 mmHg.   ASSESSMENT & PLAN CHA2DS2-VASc Score = 4  The patient's score is based upon: CHF History: 0 HTN History: 1 Diabetes History: 1 Stroke History: 0 Vascular Disease History: 1 Age Score: 1 Gender Score: 0       ASSESSMENT AND PLAN: Paroxysmal Atrial Fibrillation (ICD10:  I48.0) The patient's CHA2DS2-VASc score is 4, indicating a 4.8% annual risk of stroke.   S/p DCCV 04/2023. S/p DCCV  08/2023.  He is currently in what appears to be atrial flutter with RVR. We had a long discussion about medication treatments and ablation in detail due to ERAF. He is borderline in terms of BMI but patient notes he recently switched from Ozempic to [facility] for improved weight loss. We discussed potential options such as Tikosyn, and amiodarone. We talked about the monitoring required for these medications, hospital admission for Tikosyn, and potential adverse effects. We also discussed pulse field ablation as an option in the form of intervention. After discussion, patient is interested in speaking with EP regarding potential for Afib ablation and also would like to begin amiodarone. We will begin amiodarone 200 mg BID x 1 month then transition to 200 mg once daily. We did discuss the potential necessity for repeat cardioversion.   Secondary Hypercoagulable State (ICD10:  D68.69)  The patient is at significant risk for stroke/thromboembolism based upon his CHA2DS2-VASc Score of 4.  Continue Apixaban (Eliquis).  No missed doses.    Follow up 3 weeks. Will help refer to EP to consider ablation.    Lake Bells, PA-C  Afib Clinic [facility] [address] [place], [place] [address] [phone]

## 2023-10-20 DIAGNOSIS — L97909 Non-pressure chronic ulcer of unspecified part of unspecified lower leg with unspecified severity: Secondary | ICD-10-CM | POA: Diagnosis not present

## 2023-10-20 DIAGNOSIS — I872 Venous insufficiency (chronic) (peripheral): Secondary | ICD-10-CM | POA: Diagnosis not present

## 2023-10-20 DIAGNOSIS — R600 Localized edema: Secondary | ICD-10-CM | POA: Diagnosis not present

## 2023-10-20 DIAGNOSIS — L97812 Non-pressure chronic ulcer of other part of right lower leg with fat layer exposed: Secondary | ICD-10-CM | POA: Diagnosis not present

## 2023-10-20 DIAGNOSIS — Z87891 Personal history of nicotine dependence: Secondary | ICD-10-CM | POA: Diagnosis not present

## 2023-10-20 DIAGNOSIS — I83009 Varicose veins of unspecified lower extremity with ulcer of unspecified site: Secondary | ICD-10-CM | POA: Diagnosis not present

## 2023-10-20 DIAGNOSIS — Z79899 Other long term (current) drug therapy: Secondary | ICD-10-CM | POA: Diagnosis not present

## 2023-10-20 DIAGNOSIS — L97322 Non-pressure chronic ulcer of left ankle with fat layer exposed: Secondary | ICD-10-CM | POA: Diagnosis not present

## 2023-10-27 DIAGNOSIS — I4891 Unspecified atrial fibrillation: Secondary | ICD-10-CM | POA: Diagnosis not present

## 2023-10-27 DIAGNOSIS — F17290 Nicotine dependence, other tobacco product, uncomplicated: Secondary | ICD-10-CM | POA: Diagnosis not present

## 2023-10-27 DIAGNOSIS — R600 Localized edema: Secondary | ICD-10-CM | POA: Diagnosis not present

## 2023-10-27 DIAGNOSIS — L97909 Non-pressure chronic ulcer of unspecified part of unspecified lower leg with unspecified severity: Secondary | ICD-10-CM | POA: Diagnosis not present

## 2023-10-27 DIAGNOSIS — I83009 Varicose veins of unspecified lower extremity with ulcer of unspecified site: Secondary | ICD-10-CM | POA: Diagnosis not present

## 2023-10-27 DIAGNOSIS — L97322 Non-pressure chronic ulcer of left ankle with fat layer exposed: Secondary | ICD-10-CM | POA: Diagnosis not present

## 2023-10-27 DIAGNOSIS — I890 Lymphedema, not elsewhere classified: Secondary | ICD-10-CM | POA: Diagnosis not present

## 2023-10-27 DIAGNOSIS — L97812 Non-pressure chronic ulcer of other part of right lower leg with fat layer exposed: Secondary | ICD-10-CM | POA: Diagnosis not present

## 2023-10-27 DIAGNOSIS — L97822 Non-pressure chronic ulcer of other part of left lower leg with fat layer exposed: Secondary | ICD-10-CM | POA: Diagnosis not present

## 2023-10-27 DIAGNOSIS — E11622 Type 2 diabetes mellitus with other skin ulcer: Secondary | ICD-10-CM | POA: Diagnosis not present

## 2023-11-09 ENCOUNTER — Encounter (HOSPITAL_COMMUNITY): Payer: Self-pay | Admitting: Internal Medicine

## 2023-11-09 ENCOUNTER — Ambulatory Visit (HOSPITAL_COMMUNITY)
Admission: RE | Admit: 2023-11-09 | Discharge: 2023-11-09 | Disposition: A | Discharge status: Home / Self Care | Payer: Medicare Other | Source: Ambulatory Visit | Attending: Internal Medicine | Admitting: Internal Medicine

## 2023-11-09 VITALS — BP 142/72 | HR 90 | Ht 72.0 in | Wt 316.6 lb

## 2023-11-09 DIAGNOSIS — Z7901 Long term (current) use of anticoagulants: Secondary | ICD-10-CM | POA: Insufficient documentation

## 2023-11-09 DIAGNOSIS — Z7985 Long-term (current) use of injectable non-insulin antidiabetic drugs: Secondary | ICD-10-CM | POA: Diagnosis not present

## 2023-11-09 DIAGNOSIS — Z5181 Encounter for therapeutic drug level monitoring: Secondary | ICD-10-CM | POA: Insufficient documentation

## 2023-11-09 DIAGNOSIS — Z7984 Long term (current) use of oral hypoglycemic drugs: Secondary | ICD-10-CM | POA: Insufficient documentation

## 2023-11-09 DIAGNOSIS — I440 Atrioventricular block, first degree: Secondary | ICD-10-CM | POA: Diagnosis not present

## 2023-11-09 DIAGNOSIS — I480 Paroxysmal atrial fibrillation: Secondary | ICD-10-CM | POA: Insufficient documentation

## 2023-11-09 DIAGNOSIS — I4510 Unspecified right bundle-branch block: Secondary | ICD-10-CM | POA: Diagnosis not present

## 2023-11-09 DIAGNOSIS — I110 Hypertensive heart disease with heart failure: Secondary | ICD-10-CM | POA: Diagnosis not present

## 2023-11-09 DIAGNOSIS — Z79899 Other long term (current) drug therapy: Secondary | ICD-10-CM | POA: Insufficient documentation

## 2023-11-09 DIAGNOSIS — E785 Hyperlipidemia, unspecified: Secondary | ICD-10-CM | POA: Insufficient documentation

## 2023-11-09 DIAGNOSIS — E119 Type 2 diabetes mellitus without complications: Secondary | ICD-10-CM | POA: Diagnosis not present

## 2023-11-09 DIAGNOSIS — Z6841 Body Mass Index (BMI) 40.0 and over, adult: Secondary | ICD-10-CM | POA: Diagnosis not present

## 2023-11-09 DIAGNOSIS — E669 Obesity, unspecified: Secondary | ICD-10-CM | POA: Diagnosis not present

## 2023-11-09 DIAGNOSIS — I5030 Unspecified diastolic (congestive) heart failure: Secondary | ICD-10-CM | POA: Insufficient documentation

## 2023-11-09 DIAGNOSIS — D6869 Other thrombophilia: Secondary | ICD-10-CM | POA: Diagnosis not present

## 2023-11-09 NOTE — Progress Notes (Signed)
Primary Care Physician: Garlan Fillers, MD Primary Cardiologist: Nanetta Batty, MD Electrophysiologist: None     Referring Physician: ED     AMAAR Frost is a 73 y.o. male with a history of CAD s/p CABG in 03/2017, HFpEF, HTN, HLD, T2DM, OSA on CPAP, obesity, and paroxysmal atrial fibrillation who presents for consultation in the [facility]. Recent David Frost visit for atrial flutter on 08/31/23 s/p successful DCCV. Patient is on Eliquis for a CHADS2VASC score of 5.  On evaluation today, he is currently in what appears to be atrial flutter with RVR. He does not have cardiac awareness. No missed doses of Eliquis. He is on diltiazem 360 mg daily. He notes he takes Mounjaro every Wednesday.   On follow up 11/09/23, he is currently in NSR. He began amiodarone 200 mg BID as a load after last visit. He is scheduled to see Dr. Elberta Fortis to discuss ablation. No missed doses of Eliquis 5 mg BID. He admits to drinking 1-2 bottles of wine daily.   Today, he denies symptoms of palpitations, chest pain, shortness of breath, orthopnea, PND, lower extremity edema, dizziness, presyncope, syncope, snoring, daytime somnolence, bleeding, or neurologic sequela. The patient is tolerating medications without difficulties and is otherwise without complaint today.    Atrial Fibrillation Risk Factors:  he does have symptoms or diagnosis of sleep apnea. he is compliant with CPAP therapy.  he has a BMI of Body mass index is 42.94 kg/m.Marland Kitchen Filed Weights   11/09/23 0845  Weight: (!) 143.6 kg     Current Outpatient Medications  Medication Sig Dispense Refill   acetaminophen (TYLENOL) 325 MG tablet Take 2 tablets (650 mg total) by mouth every 6 (six) hours as needed for mild pain.     ALPRAZolam (XANAX) 0.25 MG tablet Take 1 tablet (0.25 mg total) by mouth 2 (two) times daily as needed for anxiety. 14 tablet 0   amiodarone (PACERONE) 200 MG tablet Take 1 tablet by mouth twice daily  for 1 month and then decrease to 1 tablet daily 90 tablet 3   amoxicillin-clavulanate (AUGMENTIN) 875-125 MG tablet Take 1 tablet by mouth 2 (two) times daily.     apixaban (ELIQUIS) 5 MG TABS tablet Take 1 tablet (5 mg total) by mouth 2 (two) times daily. 60 tablet 1   atorvastatin (LIPITOR) 40 MG tablet TAKE 1 TABLET(40 MG) BY MOUTH DAILY 90 tablet 0   bimatoprost (LUMIGAN) 0.01 % SOLN Lumigan 0.01 % eye drops  INSTILL 1 DROP INTO LEFT EYE EVERY NIGHT AT BEDTIME     Cholecalciferol 1.25 MG (50000 UT) capsule Take 50,000 Units by mouth once a week.     diltiazem (CARDIZEM CD) 360 MG 24 hr capsule Take 1 capsule (360 mg total) by mouth daily. 30 capsule 1   fluticasone (FLONASE) 50 MCG/ACT nasal spray Place 1 spray into both nostrils daily.     furosemide (LASIX) 40 MG tablet Take 40 mg by mouth.     icosapent Ethyl (VASCEPA) 1 g capsule TAKE 2 CAPSULES(2 GRAMS) BY MOUTH TWICE DAILY 360 capsule 3   JARDIANCE 25 MG TABS tablet Take 25 mg by mouth daily.     loratadine (CLARITIN) 10 MG tablet Take 10 mg by mouth daily.     metFORMIN (GLUCOPHAGE-XR) 500 MG 24 hr tablet Take 500 mg by mouth every evening. With meal.     MOUNJARO 7.5 MG/0.5ML Pen Inject 7.5 mg into the skin once a week.  Multiple Vitamins-Minerals (PRESERVISION AREDS 2 PO) Take 1 capsule by mouth 2 (two) times daily.     mupirocin ointment (BACTROBAN) 2 % Apply 1 Application topically 3 (three) times daily. For 7 days.     nitroGLYCERIN (NITROSTAT) 0.4 MG SL tablet PLACE 1 TABLET UNDER THE TONGUE EVERY 5 MINUTES AS NEEDED FOR CHEST PAIN 25 tablet 0   olmesartan (BENICAR) 40 MG tablet Take 1 tablet (40 mg total) by mouth daily. 30 tablet 1   potassium chloride (KLOR-CON M) 10 MEQ tablet Take 10 mEq by mouth daily.     SYMBICORT 160-4.5 MCG/ACT inhaler Inhale 2 puffs into the lungs in the morning and at bedtime. Not to be taken more frequently than twice daily.     triamcinolone cream (KENALOG) 0.1 % Apply 1 Application topically 2  (two) times daily as needed (Redness).     No current facility-administered medications for this encounter.    Atrial Fibrillation Management history:  Previous antiarrhythmic drugs: amiodarone Previous cardioversions: 04/2023, 08/2023 Previous ablations: none Anticoagulation history: Eliquis   ROS- All systems are reviewed and negative except as per the HPI above.  Physical Exam: BP (!) 142/72   Pulse 90   Ht 6' (1.829 m)   Wt (!) 143.6 kg   BMI 42.94 kg/m   GEN- The patient is well appearing, alert and oriented x 3 today.   Neck - no JVD or carotid bruit noted Lungs- Clear to ausculation bilaterally, normal work of breathing Heart- Regular rate and rhythm, no murmurs, rubs or gallops, PMI not laterally displaced Extremities- no clubbing, cyanosis, or edema Skin - no rash or ecchymosis noted   EKG today demonstrates  Vent. rate 90 BPM PR interval 320 ms QRS duration 172 ms QT/QTcB 414/506 ms P-R-T axes -87 263 57 Unusual P axis, possible ectopic atrial rhythm - appears to be NSR Right bundle branch block Abnormal ECG When compared with ECG of 18-Oct-2023 09:53, PREVIOUS ECG IS PRESENT  Echo 05/18/23 demonstrated   1. Left ventricular ejection fraction, by estimation, is 65 to 70%. The  left ventricle has normal function. Left ventricular endocardial border  not optimally defined to evaluate regional wall motion. Left ventricular  diastolic function could not be  evaluated.   2. Right ventricular systolic function was not well visualized. The right  ventricular size is not well visualized.   3. Left atrial size was mildly dilated.   4. Right atrial size was mildly dilated.   5. The mitral valve is abnormal. No evidence of mitral valve  regurgitation. No evidence of mitral stenosis.   6. The aortic valve was not well visualized. Aortic valve regurgitation  is not visualized.   7. Aortic dilatation noted. There is mild dilatation of the ascending  aorta,  measuring 44 mm.   8. The inferior vena cava is dilated in size with >50% respiratory  variability, suggesting right atrial pressure of 8 mmHg.   ASSESSMENT & PLAN CHA2DS2-VASc Score = 4  The patient's score is based upon: CHF History: 0 HTN History: 1 Diabetes History: 1 Stroke History: 0 Vascular Disease History: 1 Age Score: 1 Gender Score: 0       ASSESSMENT AND PLAN: Paroxysmal Atrial Fibrillation (ICD10:  I48.0) The patient's CHA2DS2-VASc score is 4, indicating a 4.8% annual risk of stroke.   S/p DCCV 04/2023. S/p DCCV 08/2023.  He is currently in NSR. We had a frank discussion about potential triggers for Afib and I emphasized he should reduce his alcohol consumption. I  mentioned to him guideline recommendations for weekly alcohol consumption. I mentioned that having 7-14 bottles of wine weekly is in general not good for his health, and can specifically trigger his Afib. I went over ablation again and also Tikosyn as options. He has an upcoming visit with Dr. Elberta Fortis to discuss possible ablation.   High risk medication monitoring (ICD10: R7229428) Patient requires ongoing monitoring for anti-arrhythmic medication which has the potential to cause life threatening arrhythmias or AV block. Qtc stable. Continue amiodarone 200 mg BID. He will transition to amiodarone 200 mg once daily in a week.   Secondary Hypercoagulable State (ICD10:  D68.69) The patient is at significant risk for stroke/thromboembolism based upon his CHA2DS2-VASc Score of 4.  Continue Apixaban (Eliquis).  No missed doses.    Follow up as scheduled with Dr. Elberta Fortis.    Lake Bells, PA-C  Afib Clinic [facility] [address] [place], [place] [address] [phone]

## 2023-11-10 DIAGNOSIS — L97322 Non-pressure chronic ulcer of left ankle with fat layer exposed: Secondary | ICD-10-CM | POA: Diagnosis not present

## 2023-11-10 DIAGNOSIS — Z720 Tobacco use: Secondary | ICD-10-CM | POA: Diagnosis not present

## 2023-11-10 DIAGNOSIS — E11622 Type 2 diabetes mellitus with other skin ulcer: Secondary | ICD-10-CM | POA: Diagnosis not present

## 2023-11-10 DIAGNOSIS — E11621 Type 2 diabetes mellitus with foot ulcer: Secondary | ICD-10-CM | POA: Diagnosis not present

## 2023-11-10 DIAGNOSIS — I83018 Varicose veins of right lower extremity with ulcer other part of lower leg: Secondary | ICD-10-CM | POA: Diagnosis not present

## 2023-11-10 DIAGNOSIS — L97812 Non-pressure chronic ulcer of other part of right lower leg with fat layer exposed: Secondary | ICD-10-CM | POA: Diagnosis not present

## 2023-11-10 DIAGNOSIS — I83028 Varicose veins of left lower extremity with ulcer other part of lower leg: Secondary | ICD-10-CM | POA: Diagnosis not present

## 2023-11-10 DIAGNOSIS — L97822 Non-pressure chronic ulcer of other part of left lower leg with fat layer exposed: Secondary | ICD-10-CM | POA: Diagnosis not present

## 2023-11-10 DIAGNOSIS — I83893 Varicose veins of bilateral lower extremities with other complications: Secondary | ICD-10-CM | POA: Diagnosis not present

## 2023-11-10 DIAGNOSIS — L97412 Non-pressure chronic ulcer of right heel and midfoot with fat layer exposed: Secondary | ICD-10-CM | POA: Diagnosis not present

## 2023-11-10 DIAGNOSIS — I890 Lymphedema, not elsewhere classified: Secondary | ICD-10-CM | POA: Diagnosis not present

## 2023-11-10 DIAGNOSIS — I4891 Unspecified atrial fibrillation: Secondary | ICD-10-CM | POA: Diagnosis not present

## 2023-11-23 ENCOUNTER — Other Ambulatory Visit: Payer: Self-pay | Admitting: Cardiovascular Disease

## 2023-11-24 DIAGNOSIS — I890 Lymphedema, not elsewhere classified: Secondary | ICD-10-CM | POA: Diagnosis not present

## 2023-11-24 DIAGNOSIS — L97822 Non-pressure chronic ulcer of other part of left lower leg with fat layer exposed: Secondary | ICD-10-CM | POA: Diagnosis not present

## 2023-11-24 DIAGNOSIS — I4891 Unspecified atrial fibrillation: Secondary | ICD-10-CM | POA: Diagnosis not present

## 2023-11-24 DIAGNOSIS — F17290 Nicotine dependence, other tobacco product, uncomplicated: Secondary | ICD-10-CM | POA: Diagnosis not present

## 2023-11-24 DIAGNOSIS — E11622 Type 2 diabetes mellitus with other skin ulcer: Secondary | ICD-10-CM | POA: Diagnosis not present

## 2023-11-24 DIAGNOSIS — I83218 Varicose veins of right lower extremity with both ulcer of other part of lower extremity and inflammation: Secondary | ICD-10-CM | POA: Diagnosis not present

## 2023-11-24 DIAGNOSIS — L97812 Non-pressure chronic ulcer of other part of right lower leg with fat layer exposed: Secondary | ICD-10-CM | POA: Diagnosis not present

## 2023-11-24 DIAGNOSIS — L97412 Non-pressure chronic ulcer of right heel and midfoot with fat layer exposed: Secondary | ICD-10-CM | POA: Diagnosis not present

## 2023-11-24 DIAGNOSIS — R600 Localized edema: Secondary | ICD-10-CM | POA: Diagnosis not present

## 2023-11-24 DIAGNOSIS — E11621 Type 2 diabetes mellitus with foot ulcer: Secondary | ICD-10-CM | POA: Diagnosis not present

## 2023-12-07 DIAGNOSIS — E1151 Type 2 diabetes mellitus with diabetic peripheral angiopathy without gangrene: Secondary | ICD-10-CM | POA: Diagnosis not present

## 2023-12-07 DIAGNOSIS — L03116 Cellulitis of left lower limb: Secondary | ICD-10-CM | POA: Diagnosis not present

## 2023-12-07 DIAGNOSIS — R600 Localized edema: Secondary | ICD-10-CM | POA: Diagnosis not present

## 2023-12-07 DIAGNOSIS — I739 Peripheral vascular disease, unspecified: Secondary | ICD-10-CM | POA: Diagnosis not present

## 2023-12-07 DIAGNOSIS — I10 Essential (primary) hypertension: Secondary | ICD-10-CM | POA: Diagnosis not present

## 2023-12-07 DIAGNOSIS — I480 Paroxysmal atrial fibrillation: Secondary | ICD-10-CM | POA: Diagnosis not present

## 2023-12-07 DIAGNOSIS — M1712 Unilateral primary osteoarthritis, left knee: Secondary | ICD-10-CM | POA: Diagnosis not present

## 2023-12-08 DIAGNOSIS — E11622 Type 2 diabetes mellitus with other skin ulcer: Secondary | ICD-10-CM | POA: Diagnosis not present

## 2023-12-08 DIAGNOSIS — E11621 Type 2 diabetes mellitus with foot ulcer: Secondary | ICD-10-CM | POA: Diagnosis not present

## 2023-12-08 DIAGNOSIS — L97909 Non-pressure chronic ulcer of unspecified part of unspecified lower leg with unspecified severity: Secondary | ICD-10-CM | POA: Diagnosis not present

## 2023-12-08 DIAGNOSIS — I83009 Varicose veins of unspecified lower extremity with ulcer of unspecified site: Secondary | ICD-10-CM | POA: Diagnosis not present

## 2023-12-08 DIAGNOSIS — L97412 Non-pressure chronic ulcer of right heel and midfoot with fat layer exposed: Secondary | ICD-10-CM | POA: Diagnosis not present

## 2023-12-08 DIAGNOSIS — I890 Lymphedema, not elsewhere classified: Secondary | ICD-10-CM | POA: Diagnosis not present

## 2023-12-08 DIAGNOSIS — I4891 Unspecified atrial fibrillation: Secondary | ICD-10-CM | POA: Diagnosis not present

## 2023-12-08 DIAGNOSIS — L97812 Non-pressure chronic ulcer of other part of right lower leg with fat layer exposed: Secondary | ICD-10-CM | POA: Diagnosis not present

## 2023-12-10 DIAGNOSIS — I83009 Varicose veins of unspecified lower extremity with ulcer of unspecified site: Secondary | ICD-10-CM | POA: Diagnosis not present

## 2023-12-10 DIAGNOSIS — L97909 Non-pressure chronic ulcer of unspecified part of unspecified lower leg with unspecified severity: Secondary | ICD-10-CM | POA: Diagnosis not present

## 2023-12-10 DIAGNOSIS — R600 Localized edema: Secondary | ICD-10-CM | POA: Diagnosis not present

## 2023-12-15 DIAGNOSIS — L97909 Non-pressure chronic ulcer of unspecified part of unspecified lower leg with unspecified severity: Secondary | ICD-10-CM | POA: Diagnosis not present

## 2023-12-15 DIAGNOSIS — E11621 Type 2 diabetes mellitus with foot ulcer: Secondary | ICD-10-CM | POA: Diagnosis not present

## 2023-12-15 DIAGNOSIS — E11622 Type 2 diabetes mellitus with other skin ulcer: Secondary | ICD-10-CM | POA: Diagnosis not present

## 2023-12-15 DIAGNOSIS — I83009 Varicose veins of unspecified lower extremity with ulcer of unspecified site: Secondary | ICD-10-CM | POA: Diagnosis not present

## 2023-12-15 DIAGNOSIS — I4891 Unspecified atrial fibrillation: Secondary | ICD-10-CM | POA: Diagnosis not present

## 2023-12-15 DIAGNOSIS — L97812 Non-pressure chronic ulcer of other part of right lower leg with fat layer exposed: Secondary | ICD-10-CM | POA: Diagnosis not present

## 2023-12-15 DIAGNOSIS — I890 Lymphedema, not elsewhere classified: Secondary | ICD-10-CM | POA: Diagnosis not present

## 2023-12-15 DIAGNOSIS — L97412 Non-pressure chronic ulcer of right heel and midfoot with fat layer exposed: Secondary | ICD-10-CM | POA: Diagnosis not present

## 2023-12-16 DIAGNOSIS — K429 Umbilical hernia without obstruction or gangrene: Secondary | ICD-10-CM | POA: Diagnosis not present

## 2023-12-16 DIAGNOSIS — I739 Peripheral vascular disease, unspecified: Secondary | ICD-10-CM | POA: Diagnosis not present

## 2023-12-16 DIAGNOSIS — K5909 Other constipation: Secondary | ICD-10-CM | POA: Diagnosis not present

## 2023-12-16 DIAGNOSIS — I10 Essential (primary) hypertension: Secondary | ICD-10-CM | POA: Diagnosis not present

## 2023-12-20 ENCOUNTER — Ambulatory Visit: Payer: BLUE CROSS/BLUE SHIELD | Attending: Cardiology | Admitting: Cardiology

## 2023-12-20 ENCOUNTER — Encounter: Payer: Self-pay | Admitting: Cardiology

## 2023-12-20 VITALS — BP 170/82 | HR 87 | Ht 72.0 in | Wt 311.0 lb

## 2023-12-20 DIAGNOSIS — I480 Paroxysmal atrial fibrillation: Secondary | ICD-10-CM | POA: Diagnosis not present

## 2023-12-20 DIAGNOSIS — D6869 Other thrombophilia: Secondary | ICD-10-CM | POA: Insufficient documentation

## 2023-12-20 DIAGNOSIS — I2510 Atherosclerotic heart disease of native coronary artery without angina pectoris: Secondary | ICD-10-CM | POA: Insufficient documentation

## 2023-12-20 DIAGNOSIS — I4510 Unspecified right bundle-branch block: Secondary | ICD-10-CM | POA: Insufficient documentation

## 2023-12-20 DIAGNOSIS — Z79899 Other long term (current) drug therapy: Secondary | ICD-10-CM | POA: Diagnosis not present

## 2023-12-20 DIAGNOSIS — I4892 Unspecified atrial flutter: Secondary | ICD-10-CM | POA: Insufficient documentation

## 2023-12-20 NOTE — Progress Notes (Signed)
 Electrophysiology Office Note:   Date:  12/20/2023  ID:  [id number] EPPS, DOB [birth year], MRN [id number]  Primary Cardiologist: Nanetta Batty, MD Primary Heart Failure: None Electrophysiologist: Ilija Maxim Jorja Loa, MD      History of Present Illness:   WIRT HEMMERICH is a 73 y.o. male with h/o coronary artery disease post CABG in 2018, chronic diastolic heart failure, hypertension, hyperlipidemia, diabetes, sleep apnea on CPAP, obesity, atrial fibrillation/flutter seen today for  for Electrophysiology evaluation of atrial fibrillation at the request of Lake Bells.  He was seen in the emergency room 08/31/2023 with cardioversion from atrial flutter.  He is continue to have episodes of atrial flutter.  He has no cardiac awareness when he is in atrial flutter.  He is currently on amiodarone.  Today, denies symptoms of palpitations, chest pain, shortness of breath, orthopnea, PND, lower extremity edema, claudication, dizziness, presyncope, syncope, bleeding, or neurologic sequela. The patient is tolerating medications without difficulties.  Today he is feeling well.  He is unaware of episodes of atrial fibrillation or flutter.  He has no chest pain or shortness of breath.  He feels that the most recent time that he went into atrial fibrillation he was quite stressed.  He is trying to lose weight.  He is on Mounjaro.  He is also going to cut back on his drinking.  He drinks 1-2 bottles of wine at night.  Review of systems complete and found to be negative unless listed in HPI.   EP Information / Studies Reviewed:    EKG is ordered today. Personal review as below.        Risk Assessment/Calculations:    CHA2DS2-VASc Score = 4   This indicates a 4.8% annual risk of stroke. The patient's score is based upon: CHF History: 0 HTN History: 1 Diabetes History: 1 Stroke History: 0 Vascular Disease History: 1 Age Score: 1 Gender Score: 0            Physical Exam:   VS:  BP (!)  170/82 (BP Location: Left Arm, Patient Position: Sitting, Cuff Size: Large)   Pulse 87   Ht 6' (1.829 m)   Wt (!) 311 lb (141.1 kg)   SpO2 97%   BMI 42.18 kg/m    Wt Readings from Last 3 Encounters:  12/20/23 (!) 311 lb (141.1 kg)  11/09/23 (!) 316 lb 9.6 oz (143.6 kg)  10/18/23 (!) 315 lb 9.6 oz (143.2 kg)     GEN: Well nourished, well developed in no acute distress NECK: No JVD; No carotid bruits CARDIAC: Regular rate and rhythm, no murmurs, rubs, gallops RESPIRATORY:  Clear to auscultation without rales, wheezing or rhonchi  ABDOMEN: Soft, non-tender, non-distended EXTREMITIES:  No edema; No deformity   ASSESSMENT AND PLAN:    1.  Paroxysmal atrial fibrillation/flutter: Patient is required cardioversion for atrial flutter.  He had atrial fibrillation in the postoperative setting, but I am unsure if he has had any further episodes.  Despite this, his atrial flutter based on his right bundle branch block is a possibly atypical flutter.  I think he would benefit from ablation for both atrial fibrillation and atrial flutter.  He would prefer a rhythm control strategy.  He is currently on amiodarone.  He preferred to lose weight prior to ablation.  Wilba Mutz see him back once he gets to his ideal weight, he would benefit from 30 pounds of weight loss potentially.  2.  High risk medication monitoring: Currently on amiodarone 200 mg daily.  Recent TSH and LFTs within normal limits.  3.  Secondary hypercoagulable state: Currently on Eliquis 5 mg twice daily for atrial fibrillation  4.  Coronary artery disease: Post CABG.  No current chest pain.  Continue plan per primary cardiology.  5.  Chronic diastolic heart failure: No obvious volume overload  6.  Obstructive sleep apnea: CPAP compliance encouraged  7.  Alcohol abuse: Patient Taylor Levick cut back on his drinking.  He drinks 1-2 bottles of wine per night.  Follow up with Afib Clinic in 6 months  Signed, Kairi Tufo Jorja Loa, MD

## 2023-12-20 NOTE — Patient Instructions (Signed)
 Medication Instructions:  Your physician recommends that you continue on your current medications as directed. Please refer to the Current Medication list given to you today.  *If you need a refill on your cardiac medications before your next appointment, please call your pharmacy*   Lab Work: None ordered   Testing/Procedures: None ordered   Follow-Up: At [facility], you and your health needs are our priority.  As part of our continuing mission to provide you with exceptional heart care, we have created designated Provider Care Teams.  These Care Teams include your primary Cardiologist (physician) and Advanced Practice Providers (APPs -  Physician Assistants and Nurse Practitioners) who all work together to provide you with the care you need, when you need it.  Your next appointment:   6 month(s)  The format for your next appointment:   In Person  Provider:   You will follow up in the Atrial Fibrillation Clinic located at [facility]. Your provider will be: Clint R. Fenton, PA-C or Lake Bells, PA-C    Thank you for choosing [facility]!!   Dory Horn, RN [phone]

## 2023-12-21 DIAGNOSIS — L97909 Non-pressure chronic ulcer of unspecified part of unspecified lower leg with unspecified severity: Secondary | ICD-10-CM | POA: Diagnosis not present

## 2023-12-21 DIAGNOSIS — R600 Localized edema: Secondary | ICD-10-CM | POA: Diagnosis not present

## 2023-12-21 DIAGNOSIS — I83009 Varicose veins of unspecified lower extremity with ulcer of unspecified site: Secondary | ICD-10-CM | POA: Diagnosis not present

## 2024-01-10 ENCOUNTER — Ambulatory Visit: Payer: BLUE CROSS/BLUE SHIELD | Attending: Cardiovascular Disease | Admitting: Cardiovascular Disease

## 2024-01-10 ENCOUNTER — Encounter: Payer: Self-pay | Admitting: Cardiovascular Disease

## 2024-01-10 ENCOUNTER — Telehealth (HOSPITAL_BASED_OUTPATIENT_CLINIC_OR_DEPARTMENT_OTHER): Payer: Self-pay | Admitting: *Deleted

## 2024-01-10 VITALS — BP 138/72 | HR 88 | Ht 74.0 in | Wt 314.0 lb

## 2024-01-10 DIAGNOSIS — F1010 Alcohol abuse, uncomplicated: Secondary | ICD-10-CM

## 2024-01-10 DIAGNOSIS — I2510 Atherosclerotic heart disease of native coronary artery without angina pectoris: Secondary | ICD-10-CM | POA: Diagnosis present

## 2024-01-10 DIAGNOSIS — I10 Essential (primary) hypertension: Secondary | ICD-10-CM | POA: Diagnosis present

## 2024-01-10 DIAGNOSIS — G4733 Obstructive sleep apnea (adult) (pediatric): Secondary | ICD-10-CM | POA: Diagnosis present

## 2024-01-10 DIAGNOSIS — E782 Mixed hyperlipidemia: Secondary | ICD-10-CM | POA: Diagnosis present

## 2024-01-10 DIAGNOSIS — I4891 Unspecified atrial fibrillation: Secondary | ICD-10-CM | POA: Diagnosis present

## 2024-01-10 DIAGNOSIS — E66813 Obesity, class 3: Secondary | ICD-10-CM | POA: Insufficient documentation

## 2024-01-10 DIAGNOSIS — Z6841 Body Mass Index (BMI) 40.0 and over, adult: Secondary | ICD-10-CM

## 2024-01-10 DIAGNOSIS — K429 Umbilical hernia without obstruction or gangrene: Secondary | ICD-10-CM | POA: Diagnosis not present

## 2024-01-10 DIAGNOSIS — I50813 Acute on chronic right heart failure: Secondary | ICD-10-CM | POA: Diagnosis present

## 2024-01-10 NOTE — Assessment & Plan Note (Signed)
 History of morbid obesity with a BMI of 40.  I did send him to [employer] and wellness which he was dissatisfied with and did not have benefit.  His weight is unchanged since I saw him.  He is on a GLP-1 agonist (Mounjaro).Aaron Aas

## 2024-01-10 NOTE — Assessment & Plan Note (Addendum)
 Echo performed 05/19/2023 showed normal LV systolic function currently on furosemide  for lower extremity edema.

## 2024-01-10 NOTE — Telephone Encounter (Signed)
   Pre-operative Risk Assessment    Patient Name: David Frost  DOB: Jun 14, 1951 MRN: [id number]   Date of last office visit: 12/20/2023 Date of next office visit: 01/10/24/TODAY, will send to Dr. Katheryne Pane.   Request for Surgical Clearance    Procedure:  Hernia Repair  Date of Surgery:  Clearance TBD                                 Surgeon:  Dr. Shela Derby Surgeon's Group or Practice Name:  [facility] Phone number:  [phone] Fax number:  [phone]   Type of Clearance Requested:   - Medical  - Pharmacy:  Hold Apixaban  (Eliquis ) Not Indicated   Type of Anesthesia:  General    Additional requests/questions:    Signed, Lauris Port   01/10/2024, 1:04 PM

## 2024-01-10 NOTE — Assessment & Plan Note (Signed)
 History of CAD status post non-STEMI in the past ultimately undergoing coronary bypass grafting x 6 by Dr. Nicanor Barge 03/22/2017.  He has remained stable from a coronary point of view since.  He denies chest pain or shortness of breath.

## 2024-01-10 NOTE — Assessment & Plan Note (Signed)
 History of PAF status post multiple cardioversions in the past most recently 08/31/2023.  He currently is on amiodarone  and Eliquis .  He saw Dr. Lawana Pray in the office 12/20/2023 to discuss treatment options.  The decision was to pursue a rhythm strategy (A-fib ablation) after he is lost some weight, was compliant with his CPAP and reduce his alcohol  intake.

## 2024-01-10 NOTE — Assessment & Plan Note (Signed)
 History of obstructive sleep apnea on CPAP which he is compliant with.

## 2024-01-10 NOTE — Assessment & Plan Note (Signed)
 History of hyperlipidemia on Vascepa  and atorvastatin  with lipid profile performed 05/07/2023 revealing a total cholesterol of 173, LDL of 89 HDL 49.

## 2024-01-10 NOTE — Patient Instructions (Addendum)
 Medication Instructions:  Your physician recommends that you continue on your current medications as directed. Please refer to the Current Medication list given to you today.  *If you need a refill on your cardiac medications before your next appointment, please call your pharmacy*   Follow-Up: At [facility], you and your health needs are our priority.  As part of our continuing mission to provide you with exceptional heart care, our providers are all part of one team.  This team includes your primary Cardiologist (physician) and Advanced Practice Providers or APPs (Physician Assistants and Nurse Practitioners) who all work together to provide you with the care you need, when you need it.  Your next appointment:   6 month(s)  Provider:   Katlyn West, NP       Then, Lauro Portal, MD will plan to see you again in 12 month(s).     We recommend signing up for the patient portal called "MyChart".  Sign up information is provided on this After Visit Summary.  MyChart is used to connect with patients for Virtual Visits (Telemedicine).  Patients are able to view lab/test results, encounter notes, upcoming appointments, etc.  Non-urgent messages can be sent to your provider as well.   To learn more about what you can do with MyChart, go to [email or web address].   Other Instructions       1st Floor: - Lobby - Registration  - Pharmacy  - Lab - Cafe  2nd Floor: - PV Lab - Diagnostic Testing (echo, CT, nuclear med)  3rd Floor: - Vacant  4th Floor: - TCTS (cardiothoracic surgery) - AFib Clinic - Structural Heart Clinic - Vascular Surgery  - Vascular Ultrasound  5th Floor: - HeartCare Cardiology (general and EP) - Clinical Pharmacy for coumadin, hypertension, lipid, weight-loss medications, and med management appointments    Valet parking services will be available as well.

## 2024-01-10 NOTE — Assessment & Plan Note (Signed)
 History of alcohol  abuse having reduced his intake from 2 bottles at night to 2 glasses a night.

## 2024-01-10 NOTE — Assessment & Plan Note (Signed)
 History of essential hypertension her blood pressure measured today at 138/72.  He is on olmesartan .
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
## 2024-01-11 NOTE — Telephone Encounter (Signed)
 Patient with diagnosis of afib on Eliquis  for anticoagulation.    Procedure: Hernia Repair  Date of procedure: TBD   CHA2DS2-VASc Score = 4   This indicates a 4.8% annual risk of stroke. The patient's score is based upon: CHF History: 0 HTN History: 1 Diabetes History: 1 Stroke History: 0 Vascular Disease History: 1 Age Score: 1 Gender Score: 0      CrCl 74 ml/min Platelet count 228  Per office protocol, patient can hold Eliquis  for 2 days prior to procedure.    **This guidance is not considered finalized until pre-operative APP has relayed final recommendations.**

## 2024-01-11 NOTE — Telephone Encounter (Signed)
   Patient Name: David Frost  DOB: 11/28/1950 MRN: [id number]  Primary Cardiologist: Lauro Portal, MD  Chart reviewed as part of pre-operative protocol coverage.  Patient was last seen in the office on 01/10/2024 by Dr. Katheryne Pane.  Per Dr. Katheryne Pane, "Okay to hold oral anticoagulant for hernia repair."  Therefore, given past medical history and time since last visit, based on ACC/AHA guidelines, GERRETT LOMAN is at acceptable risk for the planned procedure without further cardiovascular testing.   Per office protocol, patient can hold Eliquis  for 2 days prior to procedure.  Please resume Eliquis  as soon as possible postprocedure, at the discretion of the surgeon.   I will route this recommendation to the requesting party via Epic fax function and remove from pre-op pool.  Please call with questions.  Jude Norton, NP 01/11/2024, 11:14 AM

## 2024-01-12 DIAGNOSIS — S80212A Abrasion, left knee, initial encounter: Secondary | ICD-10-CM | POA: Diagnosis not present

## 2024-01-12 DIAGNOSIS — I890 Lymphedema, not elsewhere classified: Secondary | ICD-10-CM | POA: Diagnosis not present

## 2024-01-12 DIAGNOSIS — I4891 Unspecified atrial fibrillation: Secondary | ICD-10-CM | POA: Diagnosis not present

## 2024-01-12 DIAGNOSIS — S91102A Unspecified open wound of left great toe without damage to nail, initial encounter: Secondary | ICD-10-CM | POA: Diagnosis not present

## 2024-01-12 DIAGNOSIS — I872 Venous insufficiency (chronic) (peripheral): Secondary | ICD-10-CM | POA: Diagnosis not present

## 2024-01-12 DIAGNOSIS — F17210 Nicotine dependence, cigarettes, uncomplicated: Secondary | ICD-10-CM | POA: Diagnosis not present

## 2024-01-12 DIAGNOSIS — I83018 Varicose veins of right lower extremity with ulcer other part of lower leg: Secondary | ICD-10-CM | POA: Diagnosis not present

## 2024-01-12 DIAGNOSIS — E11622 Type 2 diabetes mellitus with other skin ulcer: Secondary | ICD-10-CM | POA: Diagnosis not present

## 2024-01-12 DIAGNOSIS — R600 Localized edema: Secondary | ICD-10-CM | POA: Diagnosis not present

## 2024-01-12 DIAGNOSIS — L97812 Non-pressure chronic ulcer of other part of right lower leg with fat layer exposed: Secondary | ICD-10-CM | POA: Diagnosis not present

## 2024-01-26 DIAGNOSIS — L97909 Non-pressure chronic ulcer of unspecified part of unspecified lower leg with unspecified severity: Secondary | ICD-10-CM | POA: Diagnosis not present

## 2024-01-26 DIAGNOSIS — E119 Type 2 diabetes mellitus without complications: Secondary | ICD-10-CM | POA: Diagnosis not present

## 2024-01-26 DIAGNOSIS — I890 Lymphedema, not elsewhere classified: Secondary | ICD-10-CM | POA: Diagnosis not present

## 2024-01-26 DIAGNOSIS — I83009 Varicose veins of unspecified lower extremity with ulcer of unspecified site: Secondary | ICD-10-CM | POA: Diagnosis not present

## 2024-01-26 DIAGNOSIS — L97919 Non-pressure chronic ulcer of unspecified part of right lower leg with unspecified severity: Secondary | ICD-10-CM | POA: Diagnosis not present

## 2024-01-26 DIAGNOSIS — I83019 Varicose veins of right lower extremity with ulcer of unspecified site: Secondary | ICD-10-CM | POA: Diagnosis not present

## 2024-01-26 DIAGNOSIS — F17200 Nicotine dependence, unspecified, uncomplicated: Secondary | ICD-10-CM | POA: Diagnosis not present

## 2024-01-27 DIAGNOSIS — I10 Essential (primary) hypertension: Secondary | ICD-10-CM | POA: Diagnosis not present

## 2024-01-27 DIAGNOSIS — I480 Paroxysmal atrial fibrillation: Secondary | ICD-10-CM | POA: Diagnosis not present

## 2024-01-27 DIAGNOSIS — G4733 Obstructive sleep apnea (adult) (pediatric): Secondary | ICD-10-CM | POA: Diagnosis not present

## 2024-01-27 DIAGNOSIS — M1712 Unilateral primary osteoarthritis, left knee: Secondary | ICD-10-CM | POA: Diagnosis not present

## 2024-01-27 DIAGNOSIS — D6869 Other thrombophilia: Secondary | ICD-10-CM | POA: Diagnosis not present

## 2024-01-27 DIAGNOSIS — I739 Peripheral vascular disease, unspecified: Secondary | ICD-10-CM | POA: Diagnosis not present

## 2024-01-27 DIAGNOSIS — E1151 Type 2 diabetes mellitus with diabetic peripheral angiopathy without gangrene: Secondary | ICD-10-CM | POA: Diagnosis not present

## 2024-02-09 DIAGNOSIS — L97909 Non-pressure chronic ulcer of unspecified part of unspecified lower leg with unspecified severity: Secondary | ICD-10-CM | POA: Diagnosis not present

## 2024-02-09 DIAGNOSIS — L97919 Non-pressure chronic ulcer of unspecified part of right lower leg with unspecified severity: Secondary | ICD-10-CM | POA: Diagnosis not present

## 2024-02-09 DIAGNOSIS — I83009 Varicose veins of unspecified lower extremity with ulcer of unspecified site: Secondary | ICD-10-CM | POA: Diagnosis not present

## 2024-02-09 DIAGNOSIS — F17200 Nicotine dependence, unspecified, uncomplicated: Secondary | ICD-10-CM | POA: Diagnosis not present

## 2024-02-09 DIAGNOSIS — I890 Lymphedema, not elsewhere classified: Secondary | ICD-10-CM | POA: Diagnosis not present

## 2024-02-09 DIAGNOSIS — E119 Type 2 diabetes mellitus without complications: Secondary | ICD-10-CM | POA: Diagnosis not present

## 2024-02-09 DIAGNOSIS — I83019 Varicose veins of right lower extremity with ulcer of unspecified site: Secondary | ICD-10-CM | POA: Diagnosis not present

## 2024-03-20 DIAGNOSIS — K429 Umbilical hernia without obstruction or gangrene: Secondary | ICD-10-CM | POA: Diagnosis not present

## 2024-04-21 ENCOUNTER — Ambulatory Visit: Payer: Self-pay | Admitting: General Surgery

## 2024-04-21 NOTE — Progress Notes (Signed)
 Surgical Instructions   Your procedure is scheduled on Monday, August 11th, 2025. Report to [facility] Main Entrance A at 8:45 A.M., then check in with the Admitting office. Any questions or running late day of surgery: call [phone]  Questions prior to your surgery date: call [phone], Monday-Friday, 8am-4pm. If you experience any cold or flu symptoms such as cough, fever, chills, shortness of breath, etc. between now and your scheduled surgery, please notify us  at the above number.     Remember:  Do not eat after midnight the night before your surgery   You may drink clear liquids until 7:45 the morning of your surgery.   Clear liquids allowed are: Water, Non-Citrus Juices (without pulp), Carbonated Beverages, Clear Tea (no milk, honey, etc.), Black Coffee Only (NO MILK, CREAM OR POWDERED CREAMER of any kind), and Gatorade.    Take these medicines the morning of surgery with A SIP OF WATER: Amiodarone  (Pacerone ) Atorvastatin  (Lipitor ) Diltiazem  (Cardizem ) Loratadine (Claritin) Symbicort Inhaler   May take these medicines IF NEEDED: Acetaminophen  (Tylenol ) Alprazolam  (Xanax ) Nitroglycerin  (if you need to take this medication, please notify a nurse at [phone] after taking) Oxymetazoline (Afrin) nasal spray   PER YOUR SURGEON'S INSTRUCTIONS APIXABAN  (ELIQUIS ) SHOULD BE HELD FOR 2 DAYS PRIOR TO SURGERY.  YOUR LAST DOSE SHOULD BE ON FRIDAY, AUGUST 8TH.   One week prior to surgery, STOP taking any Aspirin  (unless otherwise instructed by your surgeon) Aleve, Naproxen, Ibuprofen, Motrin, Advil, Goody's, BC's, all herbal medications, fish oil, and non-prescription vitamins. This includes your loratadine-pseudoephedrine (Claritin-D).    WHAT DO I DO ABOUT MY DIABETES MEDICATION?   Tirzepatide (Mounjaro) should be held for 7 days prior to your surgery.  Your last dose should be on or before Sunday, August 3rd.  Jardiance  should be held for 72 hours prior to your  surgery.  Your last dose should be on Thursday, August 7th.     Do not take Metformin (Glucophage-XR) on the morning of surgery.      HOW TO MANAGE YOUR DIABETES BEFORE AND AFTER SURGERY  Why is it important to control my blood sugar before and after surgery? Improving blood sugar levels before and after surgery helps healing and can limit problems. A way of improving blood sugar control is eating a healthy diet by:  Eating less sugar and carbohydrates  Increasing activity/exercise  Talking with your doctor about reaching your blood sugar goals High blood sugars (greater than 180 mg/dL) can raise your risk of infections and slow your recovery, so you will need to focus on controlling your diabetes during the weeks before surgery. Make sure that the doctor who takes care of your diabetes knows about your planned surgery including the date and location.  How do I manage my blood sugar before surgery? Check your blood sugar at least 4 times a day, starting 2 days before surgery, to make sure that the level is not too high or low.  Check your blood sugar the morning of your surgery when you wake up and every 2 hours until you get to the Short Stay unit.  If your blood sugar is less than 70 mg/dL, you will need to treat for low blood sugar: Do not take insulin . Treat a low blood sugar (less than 70 mg/dL) with  cup of clear juice (cranberry or apple), 4 glucose tablets, OR glucose gel. Recheck blood sugar in 15 minutes after treatment (to make sure it is greater than 70 mg/dL). If your blood sugar is not  greater than 70 mg/dL on recheck, call [phone] for further instructions. Report your blood sugar to the short stay nurse when you get to Short Stay.  If you are admitted to the hospital after surgery: Your blood sugar will be checked by the staff and you will probably be given insulin  after surgery (instead of oral diabetes medicines) to make sure you have good blood sugar levels. The  goal for blood sugar control after surgery is 80-180 mg/dL.                      Do NOT Smoke (Tobacco/Vaping) for 24 hours prior to your procedure.  If you use a CPAP at night, you may bring your mask/headgear for your overnight stay.   You will be asked to remove any contacts, glasses, piercing's, hearing aid's, dentures/partials prior to surgery. Please bring cases for these items if needed.    Patients discharged the day of surgery will not be allowed to drive home, and someone needs to stay with them for 24 hours.  SURGICAL WAITING ROOM VISITATION Patients may have no more than 2 support people in the waiting area - these visitors may rotate.   Pre-op nurse will coordinate an appropriate time for 1 ADULT support person, who may not rotate, to accompany patient in pre-op.  Children under the age of 61 must have an adult with them who is not the patient and must remain in the main waiting area with an adult.  If the patient needs to stay at the hospital during part of their recovery, the visitor guidelines for inpatient rooms apply.  Please refer to the [facility] website for the visitor guidelines for any additional information.   If you received a COVID test during your pre-op visit  it is requested that you wear a mask when out in public, stay away from anyone that may not be feeling well and notify your surgeon if you develop symptoms. If you have been in contact with anyone that has tested positive in the last 10 days please notify you surgeon.      Pre-operative CHG Bathing Instructions   You can play a key role in reducing the risk of infection after surgery. Your skin needs to be as free of germs as possible. You can reduce the number of germs on your skin by washing with CHG (chlorhexidine  gluconate) soap before surgery. CHG is an antiseptic soap that kills germs and continues to kill germs even after washing.   DO NOT use if you have an allergy to chlorhexidine /CHG or  antibacterial soaps. If your skin becomes reddened or irritated, stop using the CHG and notify one of our RNs at [phone].              TAKE A SHOWER THE NIGHT BEFORE SURGERY AND THE DAY OF SURGERY    Please keep in mind the following:  DO NOT shave, including legs and underarms, 48 hours prior to surgery.   You may shave your face before/day of surgery.  Place clean sheets on your bed the night before surgery Use a clean washcloth (not used since being washed) for each shower. DO NOT sleep with pet's night before surgery.  CHG Shower Instructions:  Wash your face and private area with normal soap. If you choose to wash your hair, wash first with your normal shampoo.  After you use shampoo/soap, rinse your hair and body thoroughly to remove shampoo/soap residue.  Turn the water OFF and  apply half the bottle of CHG soap to a CLEAN washcloth.  Apply CHG soap ONLY FROM YOUR NECK DOWN TO YOUR TOES (washing for 3-5 minutes)  DO NOT use CHG soap on face, private areas, open wounds, or sores.  Pay special attention to the area where your surgery is being performed.  If you are having back surgery, having someone wash your back for you may be helpful. Wait 2 minutes after CHG soap is applied, then you may rinse off the CHG soap.  Pat dry with a clean towel  Put on clean pajamas    Additional instructions for the day of surgery: DO NOT APPLY any lotions, deodorants, cologne, or perfumes.   Do not wear jewelry or makeup Do not wear nail polish, gel polish, artificial nails, or any other type of covering on natural nails (fingers and toes) Do not bring valuables to the hospital. [facility] is not responsible for valuables/personal belongings. Put on clean/comfortable clothes.  Please brush your teeth.  Ask your nurse before applying any prescription medications to the skin.

## 2024-04-24 ENCOUNTER — Other Ambulatory Visit: Payer: Self-pay

## 2024-04-24 ENCOUNTER — Encounter (HOSPITAL_COMMUNITY)
Admission: RE | Admit: 2024-04-24 | Discharge: 2024-04-24 | Disposition: A | Discharge status: Home / Self Care | Source: Ambulatory Visit | Attending: General Surgery

## 2024-04-24 ENCOUNTER — Encounter (HOSPITAL_COMMUNITY): Payer: Self-pay

## 2024-04-24 VITALS — BP 156/79 | HR 82 | Temp 98.0°F | Resp 18 | Ht 73.0 in | Wt 307.0 lb

## 2024-04-24 DIAGNOSIS — I252 Old myocardial infarction: Secondary | ICD-10-CM | POA: Insufficient documentation

## 2024-04-24 DIAGNOSIS — I10 Essential (primary) hypertension: Secondary | ICD-10-CM | POA: Diagnosis not present

## 2024-04-24 DIAGNOSIS — I480 Paroxysmal atrial fibrillation: Secondary | ICD-10-CM | POA: Insufficient documentation

## 2024-04-24 DIAGNOSIS — Z01812 Encounter for preprocedural laboratory examination: Secondary | ICD-10-CM | POA: Insufficient documentation

## 2024-04-24 DIAGNOSIS — J449 Chronic obstructive pulmonary disease, unspecified: Secondary | ICD-10-CM | POA: Diagnosis not present

## 2024-04-24 DIAGNOSIS — I2510 Atherosclerotic heart disease of native coronary artery without angina pectoris: Secondary | ICD-10-CM | POA: Insufficient documentation

## 2024-04-24 DIAGNOSIS — G4733 Obstructive sleep apnea (adult) (pediatric): Secondary | ICD-10-CM | POA: Insufficient documentation

## 2024-04-24 DIAGNOSIS — E119 Type 2 diabetes mellitus without complications: Secondary | ICD-10-CM | POA: Insufficient documentation

## 2024-04-24 DIAGNOSIS — Z01818 Encounter for other preprocedural examination: Secondary | ICD-10-CM

## 2024-04-24 HISTORY — DX: Acute myocardial infarction, unspecified: I21.9

## 2024-04-24 HISTORY — DX: Unspecified osteoarthritis, unspecified site: M19.90

## 2024-04-24 LAB — CBC
HCT: 45 % (ref 39.0–52.0)
Hemoglobin: 15.3 g/dL (ref 13.0–17.0)
MCH: 30.8 pg (ref 26.0–34.0)
MCHC: 34 g/dL (ref 30.0–36.0)
MCV: 90.7 fL (ref 80.0–100.0)
Platelets: 207 K/uL (ref 150–400)
RBC: 4.96 MIL/uL (ref 4.22–5.81)
RDW: 14.7 % (ref 11.5–15.5)
WBC: 7.8 K/uL (ref 4.0–10.5)
nRBC: 0 % (ref 0.0–0.2)

## 2024-04-24 LAB — SURGICAL PCR SCREEN
MRSA, PCR: POSITIVE — AB
Staphylococcus aureus: POSITIVE — AB

## 2024-04-24 LAB — BASIC METABOLIC PANEL WITH GFR
Anion gap: 10 (ref 5–15)
BUN: 23 mg/dL (ref 8–23)
CO2: 27 mmol/L (ref 22–32)
Calcium: 9.1 mg/dL (ref 8.9–10.3)
Chloride: 101 mmol/L (ref 98–111)
Creatinine, Ser: 1.13 mg/dL (ref 0.61–1.24)
GFR, Estimated: >60 mL/min (ref >60)
Glucose, Bld: 126 mg/dL — ABNORMAL HIGH (ref 70–99)
Potassium: 4.9 mmol/L (ref 3.5–5.1)
Sodium: 138 mmol/L (ref 135–145)

## 2024-04-24 LAB — GLUCOSE, CAPILLARY: Glucose-Capillary: 128 mg/dL — ABNORMAL HIGH (ref 70–99)

## 2024-04-24 NOTE — Progress Notes (Signed)
 PCP - Dr. Toribio Shan Cardiologist - Dr. Dorn Lesches, clearance 01/10/2024 EP Cardiologist: Dr. Soyla Norton  PPM/ICD - denies Device Orders - na Rep Notified - na  Chest x-ray - 08/31/2023 EKG - 12/20/2023 Stress Test - 08/09/2006 ECHO - 05/19/2023 Cardiac Cath -   Sleep Study -  OSA with nightly CPAP  Type II diabetic. Blood sugar 128 at PAT appointment.  A1C drawn today Fasting Blood Sugar : 97-98  Checks Blood Sugar every other day  Last dose of GLP1 agonist-  Mounjaro GLP1 instructions: Hold 7 days, last dose 7/30/20225  Blood Thinner Instructions: Eliquis , hold 2 days, last dose 04/28/2024 Aspirin  Instructions: denies  ERAS Protcol - Clears until 0745  Anesthesia review: Yes. CAD, HTN, DM, OSA, RBBB  Patient denies shortness of breath, fever, cough and chest pain at PAT appointment   All instructions explained to the patient, with a verbal understanding of the material. Patient agrees to go over the instructions while at home for a better understanding. Patient also instructed to self quarantine after being tested for COVID-19. The opportunity to ask questions was provided.

## 2024-04-25 LAB — HEMOGLOBIN A1C
Hgb A1c MFr Bld: 6.1 % — ABNORMAL HIGH (ref 4.8–5.6)
Mean Plasma Glucose: 128 mg/dL

## 2024-04-25 NOTE — Anesthesia Preprocedure Evaluation (Addendum)
 Anesthesia Evaluation  Patient identified by MRN, date of birth, ID band Patient awake    Reviewed: Allergy & Precautions, NPO status , Patient's Chart, lab work & pertinent test results  Airway Mallampati: III  TM Distance: >3 FB Neck ROM: Full    Dental  (+) Teeth Intact, Dental Advisory Given   Pulmonary sleep apnea and Continuous Positive Airway Pressure Ventilation , Current Smoker   breath sounds clear to auscultation       Cardiovascular hypertension, Pt. on medications + CAD, + Past MI and + CABG  + dysrhythmias  Rhythm:Regular Rate:Normal     Neuro/Psych  PSYCHIATRIC DISORDERS Anxiety     negative neurological ROS     GI/Hepatic Neg liver ROS,GERD  ,,  Endo/Other  diabetes, Type 2, Oral Hypoglycemic Agents    Renal/GU negative Renal ROS     Musculoskeletal  (+) Arthritis ,    Abdominal  (+) + obese  Peds  Hematology negative hematology ROS (+)   Anesthesia Other Findings   Reproductive/Obstetrics                              Anesthesia Physical Anesthesia Plan  ASA: 3  Anesthesia Plan: General   Post-op Pain Management: Tylenol  PO (pre-op)* and Toradol  IV (intra-op)*   Induction: Intravenous  PONV Risk Score and Plan: 2 and Ondansetron , Dexamethasone  and Midazolam   Airway Management Planned: Oral ETT  Additional Equipment: None  Intra-op Plan:   Post-operative Plan: Extubation in OR  Informed Consent: I have reviewed the patients History and Physical, chart, labs and discussed the procedure including the risks, benefits and alternatives for the proposed anesthesia with the patient or authorized representative who has indicated his/her understanding and acceptance.     Dental advisory given  Plan Discussed with: CRNA  Anesthesia Plan Comments: (PAT note by Lynwood Hope, PA-C: 73 year old male follows with cardiology for history of HTN, HLD, OSA on CPAP, PAF s/p  multiple cardioversions maintained on amiodarone  and Eliquis , CAD s/p prior NSTEMI ultimately underwent CABG x 6 03/2017.  TEE 04/2023 showed EF 60 to 65%, normal RV, no significant valvular abnormalities.  Cardiac clearance per telephone encounter 01/11/2024, hart reviewed as part of pre-operative protocol coverage.  Patient was last seen in the office on 01/10/2024 by Dr. Court.  Per Dr. Court, Okay to hold oral anticoagulant for hernia repair. Therefore, given past medical history and time since last visit, based on ACC/AHA guidelines, David Frost is at acceptable risk for the planned procedure without further cardiovascular testing.  Per office protocol, patient can hold Eliquis  for 2 days prior to procedure.  Please resume Eliquis  as soon as possible postprocedure, at the discretion of the surgeon.  Patient reports last dose Eliquis  04/28/2024.  Reports last dose Mounjaro 04/19/2024.  Other pertinent history includes current smoker with associated COPD (on Symbicort), non-insulin -dependent DM2, EtOH abuse, GERD.  Preop labs reviewed, unremarkable.  DM2 well-controlled with A1c 6.1.  EKG 12/20/2023: Wide QRS rhythm.  Rate 87. Left axis deviation. Right bundle branch block  TEE 05/19/2023: 1. Left ventricular ejection fraction, by estimation, is 60 to 65%. The  left ventricle has normal function.  2. Right ventricular systolic function is normal. The right ventricular  size is normal.  3. Left atrial size was mildly dilated. No left atrial/left atrial  appendage thrombus was detected.  4. Right atrial size was mildly dilated.  5. The mitral valve is normal in structure. Trivial mitral  valve  regurgitation. No evidence of mitral stenosis.  6. The aortic valve is tricuspid. Aortic valve regurgitation is trivial.  No aortic stenosis is present.  7. Aortic dilatation noted. There is mild dilatation of the ascending  aorta, measuring 40 mm. There is Moderate (Grade III) plaque involving the   descending aorta.    )         Anesthesia Quick Evaluation

## 2024-04-25 NOTE — Progress Notes (Signed)
 Spoke with Erminio at Dr. Gomez office and made her aware of + MRSA from PAT appointment

## 2024-04-25 NOTE — Progress Notes (Signed)
 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  TEE 04/2023 showed EF 60 to 65%, normal RV, no significant valvular abnormalities.  Cardiac clearance per telephone encounter 01/11/2024, hart reviewed as part of pre-operative protocol coverage.  Patient was last seen in the office on 01/10/2024 by Dr. Court.  Per Dr. Court, Okay to hold oral anticoagulant for hernia repair. Therefore, given past medical history and time since last visit, based on ACC/AHA guidelines, David Frost is at acceptable risk for the planned procedure without further cardiovascular testing.  Per office protocol, patient can hold Eliquis  for 2 days prior to procedure.  Please resume Eliquis  as soon as possible postprocedure, at the discretion of the surgeon.  Patient reports last dose Eliquis  04/28/2024.  Reports last dose Mounjaro 04/19/2024.  Other pertinent history includes current smoker with associated COPD (on Symbicort), non-insulin -dependent DM2, EtOH abuse, GERD.  Preop labs reviewed, unremarkable.  DM2 well-controlled with A1c 6.1.  EKG 12/20/2023: Wide QRS rhythm.  Rate 87. Left axis deviation. Right bundle branch block  TEE 05/19/2023: 1. Left ventricular ejection fraction, by estimation, is 60 to 65%. The  left ventricle has normal function.   2. Right ventricular systolic function is normal. The right ventricular  size is normal.   3. Left atrial size was mildly dilated. No left atrial/left atrial  appendage thrombus was detected.   4. Right atrial size was mildly dilated.   5. The mitral valve is normal in structure. Trivial mitral valve  regurgitation. No evidence of mitral stenosis.   6. The aortic valve is tricuspid. Aortic valve regurgitation is trivial.  No aortic stenosis is present.   7. Aortic  dilatation noted. There is mild dilatation of the ascending  aorta, measuring 40 mm. There is Moderate (Grade III) plaque involving the  descending aorta.     David Frost [facility]/Anesthesiology Phone [phone] 04/25/2024 2:51 PM

## 2024-04-28 NOTE — Progress Notes (Signed)
 Pt and wife made aware of surgery time change for 05/01/24 1140-1240, arrival 0940, stop drinking clear liquids by 0840, and to follow all other previous instructions given.

## 2024-05-01 ENCOUNTER — Encounter (HOSPITAL_COMMUNITY)
Admission: RE | Disposition: A | Discharge status: Home / Self Care | Payer: Self-pay | Source: Home / Self Care | Attending: General Surgery

## 2024-05-01 ENCOUNTER — Other Ambulatory Visit: Payer: Self-pay

## 2024-05-01 ENCOUNTER — Ambulatory Visit (HOSPITAL_COMMUNITY)
Admission: RE | Admit: 2024-05-01 | Discharge: 2024-05-01 | Disposition: A | Discharge status: Home / Self Care | Attending: General Surgery | Admitting: General Surgery

## 2024-05-01 ENCOUNTER — Ambulatory Visit (HOSPITAL_COMMUNITY): Payer: Self-pay | Admitting: Anesthesiology

## 2024-05-01 ENCOUNTER — Ambulatory Visit (HOSPITAL_COMMUNITY): Payer: Self-pay | Admitting: Physician Assistant

## 2024-05-01 ENCOUNTER — Encounter (HOSPITAL_COMMUNITY): Payer: Self-pay | Admitting: General Surgery

## 2024-05-01 DIAGNOSIS — F419 Anxiety disorder, unspecified: Secondary | ICD-10-CM | POA: Diagnosis not present

## 2024-05-01 DIAGNOSIS — K429 Umbilical hernia without obstruction or gangrene: Secondary | ICD-10-CM | POA: Diagnosis not present

## 2024-05-01 DIAGNOSIS — I2510 Atherosclerotic heart disease of native coronary artery without angina pectoris: Secondary | ICD-10-CM | POA: Insufficient documentation

## 2024-05-01 DIAGNOSIS — I10 Essential (primary) hypertension: Secondary | ICD-10-CM | POA: Insufficient documentation

## 2024-05-01 DIAGNOSIS — Z7901 Long term (current) use of anticoagulants: Secondary | ICD-10-CM | POA: Diagnosis not present

## 2024-05-01 DIAGNOSIS — G4730 Sleep apnea, unspecified: Secondary | ICD-10-CM | POA: Diagnosis not present

## 2024-05-01 DIAGNOSIS — E119 Type 2 diabetes mellitus without complications: Secondary | ICD-10-CM

## 2024-05-01 DIAGNOSIS — Z7985 Long-term (current) use of injectable non-insulin antidiabetic drugs: Secondary | ICD-10-CM | POA: Diagnosis not present

## 2024-05-01 DIAGNOSIS — F17200 Nicotine dependence, unspecified, uncomplicated: Secondary | ICD-10-CM

## 2024-05-01 DIAGNOSIS — Z7984 Long term (current) use of oral hypoglycemic drugs: Secondary | ICD-10-CM | POA: Diagnosis not present

## 2024-05-01 DIAGNOSIS — I252 Old myocardial infarction: Secondary | ICD-10-CM | POA: Insufficient documentation

## 2024-05-01 DIAGNOSIS — Z951 Presence of aortocoronary bypass graft: Secondary | ICD-10-CM | POA: Diagnosis not present

## 2024-05-01 LAB — GLUCOSE, CAPILLARY
Glucose-Capillary: 124 mg/dL — ABNORMAL HIGH (ref 70–99)
Glucose-Capillary: 135 mg/dL — ABNORMAL HIGH (ref 70–99)

## 2024-05-01 SURGERY — REPAIR, HERNIA, UMBILICAL, LAPAROSCOPIC
Anesthesia: General | Site: Abdomen

## 2024-05-01 MED ORDER — PROPOFOL 10 MG/ML IV BOLUS
INTRAVENOUS | Status: DC | PRN
  Administered 2024-05-01 (×2): 200 mg via INTRAVENOUS

## 2024-05-01 MED ORDER — ROCURONIUM BROMIDE 10 MG/ML (PF) SYRINGE
PREFILLED_SYRINGE | INTRAVENOUS | Status: DC | PRN
  Administered 2024-05-01: 50 mg via INTRAVENOUS
  Administered 2024-05-01 (×2): 10 mg via INTRAVENOUS
  Administered 2024-05-01: 50 mg via INTRAVENOUS

## 2024-05-01 MED ORDER — OXYCODONE HCL 5 MG PO TABS: 5.0000 mg | ORAL_TABLET | Freq: Three times a day (TID) | ORAL | 0 refills | Status: DC | PRN

## 2024-05-01 MED ORDER — FENTANYL CITRATE (PF) 100 MCG/2ML IJ SOLN
INTRAMUSCULAR | Status: AC
  Filled 2024-05-01: qty 2

## 2024-05-01 MED ORDER — BUPIVACAINE HCL (PF) 0.25 % IJ SOLN
INTRAMUSCULAR | Status: AC
  Filled 2024-05-01: qty 30

## 2024-05-01 MED ORDER — ALBUTEROL SULFATE (2.5 MG/3ML) 0.083% IN NEBU
INHALATION_SOLUTION | RESPIRATORY_TRACT | Status: AC
  Administered 2024-05-01 (×2): 2.5 mg via RESPIRATORY_TRACT
  Filled 2024-05-01: qty 3

## 2024-05-01 MED ORDER — FENTANYL CITRATE (PF) 250 MCG/5ML IJ SOLN
INTRAMUSCULAR | Status: AC
  Filled 2024-05-01: qty 5

## 2024-05-01 MED ORDER — INSULIN ASPART 100 UNIT/ML IJ SOLN: 0.0000 [IU] | INTRAMUSCULAR | Status: DC | PRN

## 2024-05-01 MED ORDER — DROPERIDOL 2.5 MG/ML IJ SOLN: 0.6250 mg | Freq: Once | INTRAMUSCULAR | Status: DC | PRN

## 2024-05-01 MED ORDER — LIDOCAINE 2% (20 MG/ML) 5 ML SYRINGE
INTRAMUSCULAR | Status: DC | PRN
  Administered 2024-05-01 (×2): 40 mg via INTRAVENOUS

## 2024-05-01 MED ORDER — CHLORHEXIDINE GLUCONATE CLOTH 2 % EX PADS: 6.0000 | MEDICATED_PAD | Freq: Once | CUTANEOUS | Status: DC

## 2024-05-01 MED ORDER — ACETAMINOPHEN 500 MG PO TABS
1000.0000 mg | ORAL_TABLET | ORAL | Status: AC
  Administered 2024-05-01 (×2): 1000 mg via ORAL
  Filled 2024-05-01: qty 2

## 2024-05-01 MED ORDER — DEXAMETHASONE SODIUM PHOSPHATE 10 MG/ML IJ SOLN
INTRAMUSCULAR | Status: DC | PRN
  Administered 2024-05-01 (×2): 10 mg via INTRAVENOUS

## 2024-05-01 MED ORDER — 0.9 % SODIUM CHLORIDE (POUR BTL) OPTIME
TOPICAL | Status: DC | PRN
  Administered 2024-05-01 (×2): 1000 mL

## 2024-05-01 MED ORDER — PHENYLEPHRINE 80 MCG/ML (10ML) SYRINGE FOR IV PUSH (FOR BLOOD PRESSURE SUPPORT)
PREFILLED_SYRINGE | INTRAVENOUS | Status: AC
  Filled 2024-05-01: qty 10

## 2024-05-01 MED ORDER — ROCURONIUM BROMIDE 10 MG/ML (PF) SYRINGE
PREFILLED_SYRINGE | INTRAVENOUS | Status: AC
  Filled 2024-05-01: qty 20

## 2024-05-01 MED ORDER — CEFAZOLIN SODIUM-DEXTROSE 3-4 GM/150ML-% IV SOLN: 3.0000 g | INTRAVENOUS | Status: DC

## 2024-05-01 MED ORDER — OXYCODONE HCL 5 MG PO TABS
5.0000 mg | ORAL_TABLET | Freq: Once | ORAL | Status: AC | PRN
  Administered 2024-05-01 (×2): 5 mg via ORAL

## 2024-05-01 MED ORDER — BUPIVACAINE HCL 0.25 % IJ SOLN
INTRAMUSCULAR | Status: DC | PRN
  Administered 2024-05-01 (×2): 14 mL

## 2024-05-01 MED ORDER — FENTANYL CITRATE (PF) 100 MCG/2ML IJ SOLN
25.0000 ug | INTRAMUSCULAR | Status: DC | PRN
  Administered 2024-05-01 (×4): 25 ug via INTRAVENOUS

## 2024-05-01 MED ORDER — ALBUTEROL SULFATE (2.5 MG/3ML) 0.083% IN NEBU: 2.5000 mg | INHALATION_SOLUTION | Freq: Once | RESPIRATORY_TRACT | Status: AC

## 2024-05-01 MED ORDER — VANCOMYCIN HCL 1500 MG/300ML IV SOLN
1500.0000 mg | Freq: Once | INTRAVENOUS | Status: AC
  Administered 2024-05-01 (×2): 1500 mg via INTRAVENOUS
  Filled 2024-05-01: qty 300

## 2024-05-01 MED ORDER — KETOROLAC TROMETHAMINE 30 MG/ML IJ SOLN
INTRAMUSCULAR | Status: DC | PRN
  Administered 2024-05-01 (×2): 30 mg via INTRAVENOUS

## 2024-05-01 MED ORDER — MIDAZOLAM HCL 2 MG/2ML IJ SOLN
INTRAMUSCULAR | Status: DC | PRN
  Administered 2024-05-01 (×2): 2 mg via INTRAVENOUS

## 2024-05-01 MED ORDER — DEXAMETHASONE SODIUM PHOSPHATE 10 MG/ML IJ SOLN
INTRAMUSCULAR | Status: AC
  Filled 2024-05-01: qty 1

## 2024-05-01 MED ORDER — OXYCODONE HCL 5 MG PO TABS
ORAL_TABLET | ORAL | Status: AC
  Filled 2024-05-01: qty 1

## 2024-05-01 MED ORDER — LACTATED RINGERS IV SOLN: INTRAVENOUS | Status: DC

## 2024-05-01 MED ORDER — SUGAMMADEX SODIUM 200 MG/2ML IV SOLN
INTRAVENOUS | Status: DC | PRN
  Administered 2024-05-01 (×2): 300 mg via INTRAVENOUS

## 2024-05-01 MED ORDER — ONDANSETRON HCL 4 MG/2ML IJ SOLN
INTRAMUSCULAR | Status: AC
  Filled 2024-05-01: qty 2

## 2024-05-01 MED ORDER — OXYCODONE HCL 5 MG/5ML PO SOLN: 5.0000 mg | Freq: Once | ORAL | Status: AC | PRN

## 2024-05-01 MED ORDER — CHLORHEXIDINE GLUCONATE 0.12 % MT SOLN
15.0000 mL | Freq: Once | OROMUCOSAL | Status: AC
  Administered 2024-05-01 (×2): 15 mL via OROMUCOSAL
  Filled 2024-05-01: qty 15

## 2024-05-01 MED ORDER — LIDOCAINE 2% (20 MG/ML) 5 ML SYRINGE
INTRAMUSCULAR | Status: AC
  Filled 2024-05-01: qty 10

## 2024-05-01 MED ORDER — ORAL CARE MOUTH RINSE: 15.0000 mL | Freq: Once | OROMUCOSAL | Status: AC

## 2024-05-01 MED ORDER — ACETAMINOPHEN 10 MG/ML IV SOLN: 1000.0000 mg | Freq: Once | INTRAVENOUS | Status: DC | PRN

## 2024-05-01 MED ORDER — FENTANYL CITRATE (PF) 250 MCG/5ML IJ SOLN
INTRAMUSCULAR | Status: DC | PRN
  Administered 2024-05-01: 100 ug via INTRAVENOUS
  Administered 2024-05-01: 50 ug via INTRAVENOUS
  Administered 2024-05-01: 100 ug via INTRAVENOUS
  Administered 2024-05-01: 50 ug via INTRAVENOUS

## 2024-05-01 MED ORDER — MIDAZOLAM HCL 2 MG/2ML IJ SOLN
INTRAMUSCULAR | Status: AC
  Filled 2024-05-01: qty 2

## 2024-05-01 MED ORDER — ONDANSETRON HCL 4 MG/2ML IJ SOLN
INTRAMUSCULAR | Status: DC | PRN
  Administered 2024-05-01 (×2): 4 mg via INTRAVENOUS

## 2024-05-01 SURGICAL SUPPLY — 35 items
BLADE CLIPPER SURG (BLADE) IMPLANT
CANISTER SUCTION 3000ML PPV (SUCTIONS) IMPLANT
CHLORAPREP W/TINT 26 (MISCELLANEOUS) ×3 IMPLANT
COVER SURGICAL LIGHT HANDLE (MISCELLANEOUS) ×3 IMPLANT
DERMABOND ADVANCED .7 DNX12 (GAUZE/BANDAGES/DRESSINGS) ×3 IMPLANT
DEVICE SECURE STRAP 25 ABSORB (INSTRUMENTS) ×3 IMPLANT
DEVICE TROCAR PUNCTURE CLOSURE (ENDOMECHANICALS) ×3 IMPLANT
ELECTRODE REM PT RTRN 9FT ADLT (ELECTROSURGICAL) ×3 IMPLANT
GLOVE BIO SURGEON STRL SZ7.5 (GLOVE) ×6 IMPLANT
GOWN STRL REUS W/ TWL LRG LVL3 (GOWN DISPOSABLE) ×6 IMPLANT
GOWN STRL REUS W/ TWL XL LVL3 (GOWN DISPOSABLE) ×3 IMPLANT
KIT BASIN OR (CUSTOM PROCEDURE TRAY) ×3 IMPLANT
KIT TURNOVER KIT B (KITS) ×3 IMPLANT
MARKER SKIN DUAL TIP RULER LAB (MISCELLANEOUS) ×3 IMPLANT
MESH VENTRALIGHT ST 8IN CRC (Mesh General) IMPLANT
NDL INSUFFLATION 14GA 120MM (NEEDLE) ×3 IMPLANT
NDL SPNL 22GX3.5 QUINCKE BK (NEEDLE) IMPLANT
NEEDLE INSUFFLATION 14GA 120MM (NEEDLE) ×2 IMPLANT
NEEDLE SPNL 22GX3.5 QUINCKE BK (NEEDLE) IMPLANT
NS IRRIG 1000ML POUR BTL (IV SOLUTION) ×3 IMPLANT
PAD ARMBOARD POSITIONER FOAM (MISCELLANEOUS) ×6 IMPLANT
SCISSORS LAP 5X35 DISP (ENDOMECHANICALS) ×3 IMPLANT
SET TUBE SMOKE EVAC HIGH FLOW (TUBING) ×3 IMPLANT
SLEEVE Z-THREAD 5X100MM (TROCAR) ×3 IMPLANT
SUT CHROMIC 2 0 SH (SUTURE) ×3 IMPLANT
SUT ETHIBOND 0 MO6 C/R (SUTURE) IMPLANT
SUT MNCRL AB 4-0 PS2 18 (SUTURE) ×3 IMPLANT
SUT VICRYL 0 UR6 27IN ABS (SUTURE) IMPLANT
TOWEL GREEN STERILE FF (TOWEL DISPOSABLE) ×3 IMPLANT
TRAY LAPAROSCOPIC MC (CUSTOM PROCEDURE TRAY) ×3 IMPLANT
TROCAR 11X100 Z THREAD (TROCAR) IMPLANT
TROCAR BALLN 12MMX100 BLUNT (TROCAR) IMPLANT
TROCAR Z-THREAD OPTICAL 5X100M (TROCAR) ×3 IMPLANT
WARMER LAPAROSCOPE (MISCELLANEOUS) ×3 IMPLANT
WATER STERILE IRR 1000ML POUR (IV SOLUTION) ×3 IMPLANT

## 2024-05-01 NOTE — H&P (Signed)
 Chief Complaint  Patient presents with   Follow-up      Umbilical hernia      Subjective   David Frost is a 73 y.o. male who presents for Follow-up (Umbilical hernia) HPI History of Present Illness David Frost is a 73 year old male who presents for pre-operative evaluation for hernia repair surgery.   He experiences anxiety regarding the upcoming hernia repair surgery. He inquires about the surgical procedure, which involves two incisions on the left side, the use of a camera and instruments for hernia repair, and reinforcement with a mesh patch. He is concerned about the post-operative appearance of his navel. He is currently on Eliquis . He engages in fitness activities to lose weight for knee replacement surgery and is concerned about the impact of these activities on his hernia.   Review of Systems  Constitutional:  Negative for chills and fever.  HENT:  Negative for ear pain and sore throat.   Eyes:  Negative for pain and visual disturbance.  Respiratory:  Negative for cough and shortness of breath.   Cardiovascular:  Negative for chest pain and palpitations.  Gastrointestinal:  Negative for abdominal pain and vomiting.  Genitourinary:  Negative for dysuria and hematuria.  Musculoskeletal:  Negative for arthralgias and back pain.  Skin:  Negative for color change and rash.  Neurological:  Negative for seizures and syncope.  All other systems reviewed and are negative.        Patient Active Problem List  Diagnosis   Hypertension   Glaucoma (increased eye pressure)            Outpatient Medications Prior to Visit  Medication Sig Dispense Refill   *omega-3 fatty acids oral 1 tab by mouth daily       ALPRAZolam  (XANAX ) 0.25 MG tablet Take 0.25 mg by mouth 2 (two) times daily       AMIOdarone  (PACERONE ) 200 MG tablet Take 1 tablet by mouth twice daily for 1 month and then decrease to 1 tablet daily       amLODIPine (NORVASC) 10 MG tablet 1 tab by mouth daily        amoxicillin-clavulanate (AUGMENTIN) 875-125 mg tablet Take 1 tablet by mouth 2 (two) times daily       apixaban  (ELIQUIS ) 5 mg tablet Take 5 mg by mouth 2 (two) times daily       aspirin  81 mg tablet 1 tab by mouth daily       atorvastatin  (LIPITOR ) 40 MG tablet Take 40 mg by mouth once daily       cholecalciferol (VITAMIN D3) 1,250 mcg (50,000 unit) capsule Take 50,000 Units by mouth every 7 (seven) days       COMBIGAN  0.2-0.5 % ophthalmic solution INSTILL 1 DROP IN BOTH EYES TWICE DAILY 5 mL 3   dilTIAZem  (CARDIZEM  CD) 360 MG CD capsule Take 360 mg by mouth       fluticasone propionate (FLONASE) 50 mcg/actuation nasal spray Place 1 spray into one nostril once daily       FUROsemide  (LASIX ) 20 MG tablet Take by mouth once daily       icosapent  ethyL (VASCEPA ) 1 gram capsule TAKE 2 CAPSULES(2 GRAMS) BY MOUTH TWICE DAILY       JARDIANCE  25 mg tablet Take 25 mg by mouth once daily       loratadine (CLARITIN) 10 mg tablet Take 10 mg by mouth once daily       losartan (COZAAR) 100 MG tablet Take 100 mg  by mouth once daily.       losartan-hydrochlorothiazide  (HYZAAR) 100-12.5 mg tablet Take 1 tablet by mouth once daily.       metFORMIN (GLUCOPHAGE-XR) 500 MG XR tablet Take 500 mg by mouth       MOUNJARO 2.5 mg/0.5 mL pen injector Inject 2.5 mg subcutaneously       nitroGLYcerin  (NITROSTAT ) 0.4 MG SL tablet Place 0.4 mg under the tongue at bedtime as needed       olmesartan  (BENICAR ) 40 MG tablet Take 40 mg by mouth once daily       potassium chloride  (KLOR-CON  M10) 10 mEq ER tablet Take 10 mEq by mouth once daily       triamcinolone  0.1 % cream Apply 1 Application topically       valsartan -hydrochlorothiazide  (DIOVAN -HCT) 320-25 mg tablet 1 tab by mouth daily        No facility-administered medications prior to visit.        Objective      Vitals:    03/20/24 0955  PainSc: 0-No pain    There is no height or weight on file to calculate BMI.   Home Vitals:      Physical Exam Physical  Exam     PE:   Constitutional: No acute distress, conversant, appears states age. Eyes: Anicteric sclerae, moist conjunctiva, no lid lag Lungs: Clear to auscultation bilaterally, normal respiratory effort CV: regular rate and rhythm, no murmurs, no peripheral edema, pedal pulses 2+ GI: Soft, no masses or hepatosplenomegaly, non-tender to palpation, 2cm UH Skin: No rashes, palpation reveals normal turgor Psychiatric: appropriate judgment and insight, oriented to person, place, and time         Assessment/Plan:    Assessment & Plan Umbilical hernia without obstruction or gangrene   Scheduled for laparoscopic repair using sutures and a mesh patch. The procedure will be outpatient, lasting 30-45 minutes with about an hour of recovery. He was reassured about the safety and efficacy of the procedure, considering his history of heart surgery. The cardiologist approved stopping Eliquis  two days prior. Stop Eliquis  two days before surgery. Perform the surgery at the hospital for cardiac monitoring availability. Schedulers will contact him to set a date and time for the surgery.   Myocardial infarction   He has a history of myocardial infarction and is on Eliquis . The cardiologist approved stopping Eliquis  two days before the hernia surgery to minimize bleeding risk. The surgery will be performed at the hospital to ensure access to cardiac care. Stop Eliquis  two days before surgery. Perform the surgery at the hospital for cardiac monitoring availability. Diagnoses and all orders for this visit:   Umbilical hernia without obstruction or gangrene   To OR for lap UHR with mesh OK to stop eliquis  per Dr. Court All risks and benefits were discussed with the patient, to generally include infection, bleeding, damage to surrounding structures, acute and chronic nerve pain, and recurrence. Alternatives were offered and described.  All questions were answered and the patient voiced understanding of the  procedure and wishes to proceed at this point.

## 2024-05-01 NOTE — Transfer of Care (Signed)
 Immediate Anesthesia Transfer of Care Note  Patient: David Frost  Procedure(s) Performed: REPAIR, HERNIA, UMBILICAL, LAPAROSCOPIC INSERTION OF MESH (Abdomen)  Patient Location: PACU  Anesthesia Type:General  Level of Consciousness: awake, alert , and oriented  Airway & Oxygen  Therapy: Patient Spontanous Breathing  Post-op Assessment: Report given to RN and Post -op Vital signs reviewed and stable  Post vital signs: Reviewed and stable  Last Vitals:  Vitals Value Taken Time  BP 157/82 05/01/24 11:30  Temp    Pulse 77 05/01/24 11:30  Resp 15 05/01/24 11:30  SpO2 93 % 05/01/24 11:30  Vitals shown include unfiled device data.  Last Pain:  Vitals:   05/01/24 1008  TempSrc:   PainSc: 0-No pain         Complications: No notable events documented.

## 2024-05-01 NOTE — Anesthesia Procedure Notes (Signed)
 Procedure Name: Intubation Date/Time: 05/01/2024 10:49 AM  Performed by: Claudene Florina Boga, CRNAPre-anesthesia Checklist: Patient identified, Emergency Drugs available, Suction available and Patient being monitored Patient Re-evaluated:Patient Re-evaluated prior to induction Oxygen  Delivery Method: Circle System Utilized Preoxygenation: Pre-oxygenation with 100% oxygen  Induction Type: IV induction Ventilation: Mask ventilation with difficulty and Oral airway inserted - appropriate to patient size Laryngoscope Size: Glidescope and 4 Grade View: Grade I Tube type: Oral Tube size: 7.5 mm Number of attempts: 2 Airway Equipment and Method: Rigid stylet, Video-laryngoscopy and Oral airway Placement Confirmation: ETT inserted through vocal cords under direct vision, positive ETCO2 and breath sounds checked- equal and bilateral Secured at: 22 cm Tube secured with: Tape Dental Injury: Teeth and Oropharynx as per pre-operative assessment  Comments: DL x 1 with Mac 4, unable to obtain adequate visualization d/t issue with laryngoscope blade. Easy intubation with Glide 4.

## 2024-05-01 NOTE — Discharge Instructions (Signed)

## 2024-05-01 NOTE — Op Note (Signed)
 05/01/2024  11:14 AM  PATIENT:  David Frost  73 y.o. male  PRE-OPERATIVE DIAGNOSIS:  UMBILICAL HERNIA  POST-OPERATIVE DIAGNOSIS:  UMBILICAL HERNIA, 2cm  PROCEDURE:  Procedure(s): REPAIR, HERNIA, UMBILICAL, LAPAROSCOPIC (N/A) INSERTION OF MESH (N/A)  SURGEON:  Surgeons and Role:    DEWAINE Rubin Calamity, MD - Primary  PHYSICIAN ASSISTANT:   ASSISTANTS: Dr. Eva Evens, PGY-4   ANESTHESIA:   local and general  EBL:  minimal   BLOOD ADMINISTERED:none  DRAINS: none   LOCAL MEDICATIONS USED:  BUPIVICAINE   SPECIMEN:  No Specimen  DISPOSITION OF SPECIMEN:  N/A  COUNTS:  YES  TOURNIQUET:  * No tourniquets in log *  DICTATION: .Dragon Dictation Findings: 2cm UH with 15cm circle Ventralight ST mesh  Details of the procedure:  After the patient was consented patient was taken back to the operating room patient was then placed in supine position bilateral SCDs in place.  The patient was prepped and draped in the usual sterile fashion. After antibiotics were confirmed a timeout was called and all facts were verified. The Veress needle technique was used to insuflate the abdomen at Palmer's point. The abdomen was insufflated to 14 mm mercury. Subsequently a 5 mm trocar was placed a camera inserted there was no injury to any intra-abdominal organs.    There was seen to be an non-incarcerated  2cm umbilical hernia.  A second camera port was in placed into the left lower quadrant.   At this the Falicform ligament was taken down with Bovie cautery maintaining hemostasis. A 5mm port was placed in the epigastrium .   I proceeded to reduce the hernia contents.  The hernia sac was dissected out of the hernia and disposed.  The fascia at the hernia was reapproximated using a #0 Ethibonds x 2.  Once the hernia was cleared away, a Bard Ventralight 15cm  mesh was inserted into the abdomen.  The mesh was secured circumferentially with am Securestrap tacker in a double crown fashion.       The omentum was brought over the area of the mesh. The fascia at the left lower quadrant port site was reapproximated with a 0 vicryl and an endoclose device.  The pneumoperitoneum was evacuated & all trocars  were removed. The skin was reapproximated with 4-0  Monocryl sutures in a subcuticular fashion. The skin was dressed with Dermabond.  The patient was taken to the recovery room in stable condition.  Type of repair -primary suture & mesh  Mesh overlap - 9cm  Placement of mesh -  beneath fascia and into peritoneal cavity  Size: 3cm, Primary Hernia, and Reducible Hernia    PLAN OF CARE: Discharge to home after PACU  PATIENT DISPOSITION:  PACU - hemodynamically stable.   Delay start of Pharmacological VTE agent (>24hrs) due to surgical blood loss or risk of bleeding: not applicable

## 2024-05-01 NOTE — Anesthesia Postprocedure Evaluation (Signed)
 Anesthesia Post Note  Patient: David Frost  Procedure(s) Performed: REPAIR, HERNIA, UMBILICAL, LAPAROSCOPIC INSERTION OF MESH (Abdomen)     Patient location during evaluation: PACU Anesthesia Type: General Level of consciousness: awake and alert Pain management: pain level controlled Vital Signs Assessment: post-procedure vital signs reviewed and stable Respiratory status: spontaneous breathing, nonlabored ventilation, respiratory function stable and patient connected to nasal cannula oxygen  Cardiovascular status: blood pressure returned to baseline and stable Postop Assessment: no apparent nausea or vomiting Anesthetic complications: no   No notable events documented.  Last Vitals:  Vitals:   05/01/24 1200 05/01/24 1215  BP: 126/70 127/71  Pulse: 62 62  Resp: 17 13  Temp:  36.6 C  SpO2: 97% 98%    Last Pain:  Vitals:   05/01/24 1215  TempSrc:   PainSc: 2                  [name]

## 2024-05-02 ENCOUNTER — Encounter (HOSPITAL_COMMUNITY): Payer: Self-pay | Admitting: General Surgery

## 2024-05-27 ENCOUNTER — Emergency Department (HOSPITAL_BASED_OUTPATIENT_CLINIC_OR_DEPARTMENT_OTHER)
Admission: EM | Admit: 2024-05-27 | Discharge: 2024-05-27 | Disposition: A | Discharge status: Home / Self Care | Attending: Emergency Medicine | Admitting: Emergency Medicine

## 2024-05-27 ENCOUNTER — Encounter (HOSPITAL_BASED_OUTPATIENT_CLINIC_OR_DEPARTMENT_OTHER): Payer: Self-pay | Admitting: Emergency Medicine

## 2024-05-27 ENCOUNTER — Emergency Department (HOSPITAL_BASED_OUTPATIENT_CLINIC_OR_DEPARTMENT_OTHER)

## 2024-05-27 DIAGNOSIS — I2510 Atherosclerotic heart disease of native coronary artery without angina pectoris: Secondary | ICD-10-CM | POA: Diagnosis not present

## 2024-05-27 DIAGNOSIS — R0602 Shortness of breath: Secondary | ICD-10-CM | POA: Insufficient documentation

## 2024-05-27 DIAGNOSIS — Z794 Long term (current) use of insulin: Secondary | ICD-10-CM | POA: Insufficient documentation

## 2024-05-27 DIAGNOSIS — I517 Cardiomegaly: Secondary | ICD-10-CM | POA: Diagnosis not present

## 2024-05-27 DIAGNOSIS — Z951 Presence of aortocoronary bypass graft: Secondary | ICD-10-CM | POA: Diagnosis not present

## 2024-05-27 DIAGNOSIS — Z79899 Other long term (current) drug therapy: Secondary | ICD-10-CM | POA: Diagnosis not present

## 2024-05-27 DIAGNOSIS — Z955 Presence of coronary angioplasty implant and graft: Secondary | ICD-10-CM | POA: Diagnosis not present

## 2024-05-27 DIAGNOSIS — Z7984 Long term (current) use of oral hypoglycemic drugs: Secondary | ICD-10-CM | POA: Insufficient documentation

## 2024-05-27 DIAGNOSIS — R0789 Other chest pain: Secondary | ICD-10-CM | POA: Diagnosis not present

## 2024-05-27 DIAGNOSIS — R079 Chest pain, unspecified: Secondary | ICD-10-CM | POA: Insufficient documentation

## 2024-05-27 DIAGNOSIS — E119 Type 2 diabetes mellitus without complications: Secondary | ICD-10-CM | POA: Diagnosis not present

## 2024-05-27 DIAGNOSIS — I480 Paroxysmal atrial fibrillation: Secondary | ICD-10-CM | POA: Diagnosis not present

## 2024-05-27 DIAGNOSIS — I10 Essential (primary) hypertension: Secondary | ICD-10-CM | POA: Diagnosis not present

## 2024-05-27 DIAGNOSIS — Z7901 Long term (current) use of anticoagulants: Secondary | ICD-10-CM | POA: Diagnosis not present

## 2024-05-27 LAB — BASIC METABOLIC PANEL WITH GFR
Anion gap: 14 (ref 5–15)
BUN: 31 mg/dL — ABNORMAL HIGH (ref 8–23)
CO2: 26 mmol/L (ref 22–32)
Calcium: 9.9 mg/dL (ref 8.9–10.3)
Chloride: 97 mmol/L — ABNORMAL LOW (ref 98–111)
Creatinine, Ser: 1.27 mg/dL — ABNORMAL HIGH (ref 0.61–1.24)
GFR, Estimated: 59 mL/min — ABNORMAL LOW (ref >60)
Glucose, Bld: 113 mg/dL — ABNORMAL HIGH (ref 70–99)
Potassium: 4.5 mmol/L (ref 3.5–5.1)
Sodium: 136 mmol/L (ref 135–145)

## 2024-05-27 LAB — CBC
HCT: 42.7 % (ref 39.0–52.0)
Hemoglobin: 14.9 g/dL (ref 13.0–17.0)
MCH: 31.4 pg (ref 26.0–34.0)
MCHC: 34.9 g/dL (ref 30.0–36.0)
MCV: 90.1 fL (ref 80.0–100.0)
Platelets: 228 K/uL (ref 150–400)
RBC: 4.74 MIL/uL (ref 4.22–5.81)
RDW: 14.1 % (ref 11.5–15.5)
WBC: 9.4 K/uL (ref 4.0–10.5)
nRBC: 0 % (ref 0.0–0.2)

## 2024-05-27 LAB — RESP PANEL BY RT-PCR (RSV, FLU A&B, COVID)  RVPGX2
Influenza A by PCR: NEGATIVE
Influenza B by PCR: NEGATIVE
Resp Syncytial Virus by PCR: NEGATIVE
SARS Coronavirus 2 by RT PCR: NEGATIVE

## 2024-05-27 LAB — TROPONIN T, HIGH SENSITIVITY
Troponin T High Sensitivity: 21 ng/L — ABNORMAL HIGH (ref 0–19)
Troponin T High Sensitivity: 18 ng/L (ref 0–19)

## 2024-05-27 LAB — PRO BRAIN NATRIURETIC PEPTIDE: Pro Brain Natriuretic Peptide: 140 pg/mL (ref <300.0)

## 2024-05-27 NOTE — Discharge Instructions (Addendum)
 Your cardiac enzyme initially was very mildly elevated at 21 and your repeat enzyme had completely normalized.  This points away from a heart attack.  Continue to take your home Eliquis , follow-up with your primary care provider, return for any severe worsening crushing chest pain.  Your EKG, chest x-ray and remaining labs were also reassuring.

## 2024-05-27 NOTE — ED Provider Notes (Signed)
 [facility] EMERGENCY DEPARTMENT AT [facility] Provider Note   CSN: [id number] Arrival date & time: 05/27/24  9145     Patient presents with: Chest Pain   David Frost is a 73 y.o. male.    Chest Pain Associated symptoms: shortness of breath   Associated symptoms: no cough      73 year old male with medical history significant for obesity, HTN, OSA on CPAP, CAD status post CABG in 2018, DM 2, paroxysmal atrial fibrillation status post multiple cardioversions, on Eliquis  who presents to the emergency department with chest pain.  The patient states that he has had a very stressful week related to his company and finances.  He states that 2 days ago he developed central chest pain that was described as a pressure-like sensation.  It has been intermittent.  No radiation.  He has chronic shortness of breath, denies any worsening, no worsening lower extremity edema.  He is currently chest pain-free.  He wanted to present to the emergency department for cardiac evaluation given his history.  Chest pain will come on at rest, no aggravating or alleviating factors.  No cough, fevers or chills.  Currently asymptomatic.  Prior to Admission medications   Medication Sig Start Date End Date Taking? Authorizing Provider  acetaminophen  (TYLENOL ) 500 MG tablet Take 500-1,000 mg by mouth every 6 (six) hours as needed (pain.).    [provider]  ALPRAZolam  (XANAX ) 0.25 MG tablet Take 1 tablet (0.25 mg total) by mouth 2 (two) times daily as needed for anxiety. 03/27/17   Barrett, Erin R, PA-C  amiodarone  (PACERONE ) 200 MG tablet Take 1 tablet by mouth twice daily for 1 month and then decrease to 1 tablet daily Patient taking differently: Take 200 mg by mouth daily. 10/18/23   Terra Fairy PARAS, PA-C  apixaban  (ELIQUIS ) 5 MG TABS tablet Take 1 tablet (5 mg total) by mouth 2 (two) times daily. 05/20/23   Jillian Buttery, MD  atorvastatin  (LIPITOR ) 40 MG tablet TAKE 1 TABLET(40 MG) BY MOUTH  DAILY Patient taking differently: daily in the afternoon. 11/23/23   Court Dorn PARAS, MD  bimatoprost (LUMIGAN) 0.01 % SOLN Place 1 drop into both eyes at bedtime.    [provider]  diltiazem  (CARDIZEM  LA) 360 MG 24 hr tablet Take 360 mg by mouth daily. 03/18/24   [provider]  furosemide  (LASIX ) 40 MG tablet Take 40 mg by mouth in the morning.    [provider]  icosapent  Ethyl (VASCEPA ) 1 g capsule TAKE 2 CAPSULES(2 GRAMS) BY MOUTH TWICE DAILY 10/12/23   Court Dorn PARAS, MD  JARDIANCE  25 MG TABS tablet Take 25 mg by mouth in the morning.    [provider]  loratadine (CLARITIN) 10 MG tablet Take 10 mg by mouth every other day.    [provider]  loratadine-pseudoephedrine (CLARITIN-D 24-HOUR) 10-240 MG 24 hr tablet Take 1 tablet by mouth every other day.    [provider]  metFORMIN (GLUCOPHAGE-XR) 500 MG 24 hr tablet Take 500 mg by mouth daily in the afternoon. With meal.    [provider]  Multiple Vitamins-Minerals (PRESERVISION AREDS 2 PO) Take 1 capsule by mouth 2 (two) times daily.    [provider]  naproxen sodium (ALEVE) 220 MG tablet Take 220-440 mg by mouth 2 (two) times daily as needed (pain.).    [provider]  nitroGLYCERIN  (NITROSTAT ) 0.4 MG SL tablet PLACE 1 TABLET UNDER THE TONGUE EVERY 5 MINUTES AS NEEDED FOR CHEST  PAIN 02/26/20   Court Dorn PARAS, MD  olmesartan  (BENICAR ) 40 MG tablet Take 1 tablet (40 mg total) by mouth daily. 05/20/23 05/19/24  Jillian Buttery, MD  oxyCODONE  (ROXICODONE ) 5 MG immediate release tablet Take 1 tablet (5 mg total) by mouth every 8 (eight) hours as needed. 05/01/24 05/01/25  Ramirez, Armando, MD  oxymetazoline (AFRIN) 0.05 % nasal spray Place 1-2 sprays into both nostrils 2 (two) times daily as needed for congestion.    [provider]  potassium chloride  (KLOR-CON  M) 10 MEQ tablet Take 10 mEq by mouth daily. 10/06/23   [provider]   SYMBICORT 160-4.5 MCG/ACT inhaler Inhale 2 puffs into the lungs in the morning and at bedtime. Not to be taken more frequently than twice daily. 01/25/23   [provider]  tirzepatide CLOYDE) 10 MG/0.5ML Pen Inject 10 mg into the skin every Wednesday.    [provider]  Vitamin D , Ergocalciferol , (DRISDOL ) 1.25 MG (50000 UNIT) CAPS capsule Take 50,000 Units by mouth every Wednesday.    [provider]    Allergies: Zolpidem    Review of Systems  Respiratory:  Positive for shortness of breath. Negative for cough.   Cardiovascular:  Positive for chest pain. Negative for leg swelling.  All other systems reviewed and are negative.   Updated Vital Signs BP 136/67   Pulse 73   Temp 98 F (36.7 C)   Resp 19   SpO2 96%   Physical Exam Vitals and nursing note reviewed.  Constitutional:      General: He is not in acute distress.    Appearance: He is well-developed. He is obese.  HENT:     Head: Normocephalic and atraumatic.  Eyes:     Conjunctiva/sclera: Conjunctivae normal.  Cardiovascular:     Rate and Rhythm: Normal rate and regular rhythm.     Heart sounds: No murmur heard. Pulmonary:     Effort: Pulmonary effort is normal. No respiratory distress.     Breath sounds: Normal breath sounds.  Abdominal:     Palpations: Abdomen is soft.     Tenderness: There is no abdominal tenderness.  Musculoskeletal:        General: No swelling.     Cervical back: Neck supple.  Skin:    General: Skin is warm and dry.     Capillary Refill: Capillary refill takes less than 2 seconds.  Neurological:     Mental Status: He is alert.  Psychiatric:        Mood and Affect: Mood normal.     (all labs ordered are listed, but only abnormal results are displayed) Labs Reviewed  BASIC METABOLIC PANEL WITH GFR - Abnormal; Notable for the following components:      Result Value   Chloride 97 (*)    Glucose, Bld 113 (*)    BUN 31 (*)    Creatinine, Ser 1.27 (*)     GFR, Estimated 59 (*)    All other components within normal limits  TROPONIN T, HIGH SENSITIVITY - Abnormal; Notable for the following components:   Troponin T High Sensitivity 21 (*)    All other components within normal limits  RESP PANEL BY RT-PCR (RSV, FLU A&B, COVID)  RVPGX2  CBC  PRO BRAIN NATRIURETIC PEPTIDE  TROPONIN T, HIGH SENSITIVITY    EKG: EKG Interpretation Date/Time:  Saturday May 27 2024 09:42:25 EDT Ventricular Rate:  85 PR Interval:    QRS Duration:  184 QT Interval:  468 QTC Calculation: 557  R Axis:   264  Text Interpretation: Atrial fibrillation RBBB and LAFB Confirmed by Jerrol Agent ([id number]) on 05/27/2024 9:47:21 AM  Radiology: [facility] Chest Port 1 View Result Date: 05/27/2024 CLINICAL DATA:  Chest pain. EXAM: PORTABLE CHEST 1 VIEW COMPARISON:  August 31, 2023. FINDINGS: Stable cardiomegaly. Status post coronary bypass graft. No acute pulmonary disease is noted. Bony thorax is unremarkable. IMPRESSION: No active disease. Electronically Signed   By: Agent Landy Raddle M.D.   On: 05/27/2024 10:06     Procedures   Medications Ordered in the ED - No data to display                                  Medical Decision Making Amount and/or Complexity of Data Reviewed Labs: ordered. Radiology: ordered.    73 year old male with medical history significant for obesity, HTN, OSA on CPAP, CAD status post CABG in 2018, DM 2, paroxysmal atrial fibrillation status post multiple cardioversions, on Eliquis  who presents to the emergency department with chest pain.  The patient states that he has had a very stressful week related to his company and finances.  He states that 2 days ago he developed central chest pain that was described as a pressure-like sensation.  It has been intermittent.  No radiation.  He has chronic shortness of breath, denies any worsening, no worsening lower extremity edema.  He is currently chest pain-free.  He wanted to present to the emergency  department for cardiac evaluation given his history.  Chest pain will come on at rest, no aggravating or alleviating factors.  No cough, fevers or chills.  Currently asymptomatic.  Medical Decision Making: DREZDEN SEITZINGER is a 73 y.o. male who presented to the ED today with chest pain, detailed above.  Based on patient's comorbidities, patient is higher risk. Patient placed on continuous vitals and telemetry monitoring while in ED which was reviewed periodically.  Complete initial physical exam performed, notably the patient was CTAB.   Reviewed and confirmed nursing documentation for past medical history, family history, social history.    Initial Assessment: With the patient's presentation of left-sided chest pain, most likely diagnosis is musculoskeletal chest pain versus GERD, although ACS remains on the differential. Other diagnoses were considered including (but not limited to) pulmonary embolism, community-acquired pneumonia, aortic dissection, pneumothorax, underlying bony abnormality, anemia. These are considered less likely due to history of present illness and physical exam findings.    In particular, concerning pulmonary embolism: Patient is currently on [facility] and has not missed any doses, low concern for PE, sx not consistent with PE. Aortic Dissection also considered but seems less likely based on the location, quality, onset, and severity of symptoms in this case.   Initial Plan: Evaluate for ACS with delta troponin and EKG evaluated as below  Evaluate for dissection, bony abnormality, or pneumonia with chest x-ray and screening laboratory evaluation including CBC, BMP  Further evaluation for pulmonary embolism not indicated at this time based on patient's PERC and Wells score.  Further evaluation for Thoracic Aortic Dissection not indicated at this time based on patient's clinical history and PE findings.   Initial Study Results: EKG was reviewed independently. Rate, rhythm, axis,  intervals all examined and without medically relevant abnormality. ST segments without concerns for elevations.    Laboratory  Delta  troponin initially elevated at 21, repeat normal at 18 BNP normal Covid, Flu RSV testing normal  CBC  and BMP without obvious metabolic or inflammatory abnormalities requiring further evaluation   Radiology  DG Chest Port 1 View Result Date: 05/27/2024 CLINICAL DATA:  Chest pain. EXAM: PORTABLE CHEST 1 VIEW COMPARISON:  August 31, 2023. FINDINGS: Stable cardiomegaly. Status post coronary bypass graft. No acute pulmonary disease is noted. Bony thorax is unremarkable. IMPRESSION: No active disease. Electronically Signed   By: Lynwood Landy Raddle M.D.   On: 05/27/2024 10:06    Final Assessment and Plan: Patient chest pain-free on repeat assessment.  Suspect component of anxiety as etiology of the patient's presentation.  Initial cardiac troponin was mildly elevated at 21, repeat was downtrending to normal at 18, very low suspicion for ACS at this time and patient is chest pain-free, low concern for unstable angina.  Low suspicion for PE and this patient on anticoagulation, chest x-ray without pneumothorax or pneumonia.  Patient symptoms not consistent with reflux, chest pressure in the setting of stressful and the work environment indicates more likely anxiety.  The patient has been appropriately medically screened and/or stabilized in the ED. I have low suspicion for any other emergent medical condition which would require further screening, evaluation or treatment in the ED or require inpatient management.  Considered admission for observation versus discharge and after consideration, and discussion with the patient patient would prefer discharge and close outpatient follow-up advised outpatient follow-up, overall stable for discharge, stable for close outpatient management.      Final diagnoses:  Chest pain, unspecified type    ED Discharge Orders     None           Jerrol Lynwood, MD 05/27/24 1340

## 2024-05-27 NOTE — ED Triage Notes (Signed)
 C/o central CP that started 2 days ago. States very stressful week. Long cardiac hx.

## 2024-06-06 DIAGNOSIS — I10 Essential (primary) hypertension: Secondary | ICD-10-CM | POA: Diagnosis not present

## 2024-06-06 DIAGNOSIS — E1151 Type 2 diabetes mellitus with diabetic peripheral angiopathy without gangrene: Secondary | ICD-10-CM | POA: Diagnosis not present

## 2024-06-06 DIAGNOSIS — Z125 Encounter for screening for malignant neoplasm of prostate: Secondary | ICD-10-CM | POA: Diagnosis not present

## 2024-06-06 DIAGNOSIS — Z0189 Encounter for other specified special examinations: Secondary | ICD-10-CM | POA: Diagnosis not present

## 2024-06-06 DIAGNOSIS — E782 Mixed hyperlipidemia: Secondary | ICD-10-CM | POA: Diagnosis not present

## 2024-06-06 DIAGNOSIS — Z1212 Encounter for screening for malignant neoplasm of rectum: Secondary | ICD-10-CM | POA: Diagnosis not present

## 2024-06-06 DIAGNOSIS — E559 Vitamin D deficiency, unspecified: Secondary | ICD-10-CM | POA: Diagnosis not present

## 2024-06-13 DIAGNOSIS — Z0000 Encounter for general adult medical examination without abnormal findings: Secondary | ICD-10-CM | POA: Diagnosis not present

## 2024-06-13 DIAGNOSIS — I480 Paroxysmal atrial fibrillation: Secondary | ICD-10-CM | POA: Diagnosis not present

## 2024-06-13 DIAGNOSIS — E782 Mixed hyperlipidemia: Secondary | ICD-10-CM | POA: Diagnosis not present

## 2024-06-13 DIAGNOSIS — D6869 Other thrombophilia: Secondary | ICD-10-CM | POA: Diagnosis not present

## 2024-06-13 DIAGNOSIS — I10 Essential (primary) hypertension: Secondary | ICD-10-CM | POA: Diagnosis not present

## 2024-06-13 DIAGNOSIS — R82998 Other abnormal findings in urine: Secondary | ICD-10-CM | POA: Diagnosis not present

## 2024-06-13 DIAGNOSIS — I252 Old myocardial infarction: Secondary | ICD-10-CM | POA: Diagnosis not present

## 2024-06-13 DIAGNOSIS — I87312 Chronic venous hypertension (idiopathic) with ulcer of left lower extremity: Secondary | ICD-10-CM | POA: Diagnosis not present

## 2024-06-13 DIAGNOSIS — Z23 Encounter for immunization: Secondary | ICD-10-CM | POA: Diagnosis not present

## 2024-06-13 DIAGNOSIS — M1712 Unilateral primary osteoarthritis, left knee: Secondary | ICD-10-CM | POA: Diagnosis not present

## 2024-06-13 DIAGNOSIS — I739 Peripheral vascular disease, unspecified: Secondary | ICD-10-CM | POA: Diagnosis not present

## 2024-06-13 DIAGNOSIS — E1151 Type 2 diabetes mellitus with diabetic peripheral angiopathy without gangrene: Secondary | ICD-10-CM | POA: Diagnosis not present

## 2024-06-14 DIAGNOSIS — M25522 Pain in left elbow: Secondary | ICD-10-CM | POA: Diagnosis not present

## 2024-06-20 ENCOUNTER — Telehealth: Payer: Self-pay

## 2024-06-20 ENCOUNTER — Encounter (HOSPITAL_COMMUNITY): Payer: Self-pay | Admitting: Internal Medicine

## 2024-06-20 ENCOUNTER — Other Ambulatory Visit (HOSPITAL_COMMUNITY): Payer: Self-pay

## 2024-06-20 ENCOUNTER — Ambulatory Visit (HOSPITAL_COMMUNITY)
Admission: RE | Admit: 2024-06-20 | Discharge: 2024-06-20 | Disposition: A | Discharge status: Home / Self Care | Source: Ambulatory Visit | Attending: Physician Assistant | Admitting: Physician Assistant

## 2024-06-20 VITALS — BP 136/64 | HR 73 | Ht 73.0 in | Wt 314.4 lb

## 2024-06-20 DIAGNOSIS — Z5181 Encounter for therapeutic drug level monitoring: Secondary | ICD-10-CM | POA: Insufficient documentation

## 2024-06-20 DIAGNOSIS — I480 Paroxysmal atrial fibrillation: Secondary | ICD-10-CM | POA: Diagnosis not present

## 2024-06-20 DIAGNOSIS — D6869 Other thrombophilia: Secondary | ICD-10-CM | POA: Diagnosis not present

## 2024-06-20 DIAGNOSIS — Z79899 Other long term (current) drug therapy: Secondary | ICD-10-CM | POA: Insufficient documentation

## 2024-06-20 DIAGNOSIS — I4891 Unspecified atrial fibrillation: Secondary | ICD-10-CM | POA: Insufficient documentation

## 2024-06-20 DIAGNOSIS — I4819 Other persistent atrial fibrillation: Secondary | ICD-10-CM | POA: Diagnosis present

## 2024-06-20 MED ORDER — APIXABAN 5 MG PO TABS: 5.0000 mg | ORAL_TABLET | Freq: Two times a day (BID) | ORAL | 6 refills | Status: AC

## 2024-06-20 MED ORDER — AMIODARONE HCL 200 MG PO TABS: 200.0000 mg | ORAL_TABLET | Freq: Every day | ORAL | 3 refills | Status: AC

## 2024-06-20 NOTE — Telephone Encounter (Signed)
-----   Message from Nurse Nancy DEL sent at 06/20/2024 10:17 AM EDT ----- Regarding: Eliquis  Hello are you able to check copay for Eliquis ? Thanks -Karlene RN [facility]

## 2024-06-20 NOTE — Telephone Encounter (Signed)
 Pharmacy Patient Advocate Encounter   Received notification from Physician's Office that prior authorization for ELIQUIS  is required/requested.   Insurance verification completed.   The patient is insured through [facility] .   Per test claim: Refill too soon. PA is not needed at this time. Medication was filled 06/20/24. Next eligible fill date is 08/04/24.  *Unable to see cost details due to claim already being paid on insurance.

## 2024-06-20 NOTE — Progress Notes (Signed)
 Primary Care Physician: Yolande Toribio [name], MD Primary Cardiologist: Dorn Lesches, MD Electrophysiologist: Will Gladis Norton, MD     Referring Physician: ED     David Frost is a 73 y.o. male with a history of CAD s/p CABG in 03/2017, HFpEF, HTN, HLD, T2DM, OSA on CPAP, obesity, and paroxysmal atrial fibrillation who presents for consultation in the [facility]. Recent ED visit for atrial flutter on 08/31/23 s/p successful DCCV. Patient is on Eliquis  for a CHADS2VASC score of 5.  On evaluation today, he is currently in what appears to be atrial flutter with RVR. He does not have cardiac awareness. No missed doses of Eliquis . He is on diltiazem  360 mg daily. He notes he takes Mounjaro every Wednesday.   On follow up 11/09/23, he is currently in NSR. He began amiodarone  200 mg BID as a load after last visit. He is scheduled to see Dr. Norton to discuss ablation. No missed doses of Eliquis  5 mg BID. He admits to drinking 1-2 bottles of wine daily.   On follow up 06/20/24, patient is currently in NSR. Seen in ED on 9/6 for chest pain with negative workup. He feels better since ED visit. No missed doses of Eliquis .   Today, he denies symptoms of palpitations, chest pain, shortness of breath, orthopnea, PND, lower extremity edema, dizziness, presyncope, syncope, snoring, daytime somnolence, bleeding, or neurologic sequela. The patient is tolerating medications without difficulties and is otherwise without complaint today.    Atrial Fibrillation Risk Factors:  he does have symptoms or diagnosis of sleep apnea. he is compliant with CPAP therapy.  he has a BMI of Body mass index is 41.48 kg/m.SABRA Filed Weights   06/20/24 0859  Weight: (!) 142.6 kg     Current Outpatient Medications  Medication Sig Dispense Refill   acetaminophen  (TYLENOL ) 500 MG tablet Take 500-1,000 mg by mouth every 6 (six) hours as needed (pain.).     ALPRAZolam  (XANAX ) 0.25 MG tablet  Take 1 tablet (0.25 mg total) by mouth 2 (two) times daily as needed for anxiety. 14 tablet 0   amiodarone  (PACERONE ) 200 MG tablet Take 1 tablet by mouth twice daily for 1 month and then decrease to 1 tablet daily 90 tablet 3   apixaban  (ELIQUIS ) 5 MG TABS tablet Take 1 tablet (5 mg total) by mouth 2 (two) times daily. 60 tablet 1   atorvastatin  (LIPITOR ) 40 MG tablet TAKE 1 TABLET(40 MG) BY MOUTH DAILY 90 tablet 3   bimatoprost (LUMIGAN) 0.01 % SOLN Place 1 drop into both eyes at bedtime.     diltiazem  (CARDIZEM  LA) 360 MG 24 hr tablet Take 360 mg by mouth daily.     furosemide  (LASIX ) 40 MG tablet Take 40 mg by mouth in the morning.     icosapent  Ethyl (VASCEPA ) 1 g capsule TAKE 2 CAPSULES(2 GRAMS) BY MOUTH TWICE DAILY 360 capsule 3   JARDIANCE  25 MG TABS tablet Take 25 mg by mouth in the morning.     loratadine (CLARITIN) 10 MG tablet Take 10 mg by mouth every other day.     loratadine-pseudoephedrine (CLARITIN-D 24-HOUR) 10-240 MG 24 hr tablet Take 1 tablet by mouth every other day.     metFORMIN (GLUCOPHAGE-XR) 500 MG 24 hr tablet Take 500 mg by mouth daily in the afternoon. With meal.     MOUNJARO 12.5 MG/0.5ML Pen Inject 12.5 mg into the skin once a week.     Multiple Vitamins-Minerals (PRESERVISION AREDS 2  PO) Take 1 capsule by mouth 2 (two) times daily.     naproxen sodium (ALEVE) 220 MG tablet Take 220-440 mg by mouth 2 (two) times daily as needed (pain.).     nitroGLYCERIN  (NITROSTAT ) 0.4 MG SL tablet PLACE 1 TABLET UNDER THE TONGUE EVERY 5 MINUTES AS NEEDED FOR CHEST PAIN 25 tablet 0   olmesartan  (BENICAR ) 40 MG tablet Take 1 tablet (40 mg total) by mouth daily. 30 tablet 1   oxymetazoline (AFRIN) 0.05 % nasal spray Place 1-2 sprays into both nostrils 2 (two) times daily as needed for congestion.     potassium chloride  (KLOR-CON  M) 10 MEQ tablet Take 10 mEq by mouth daily.     SYMBICORT 160-4.5 MCG/ACT inhaler Inhale 2 puffs into the lungs in the morning and at bedtime. Not to be  taken more frequently than twice daily.     Vitamin D , Ergocalciferol , (DRISDOL ) 1.25 MG (50000 UNIT) CAPS capsule Take 50,000 Units by mouth every Wednesday.     No current facility-administered medications for this encounter.    Atrial Fibrillation Management history:  Previous antiarrhythmic drugs: amiodarone  Previous cardioversions: 04/2023, 08/2023 Previous ablations: none Anticoagulation history: Eliquis    ROS- All systems are reviewed and negative except as per the HPI above.  Physical Exam: BP 136/64   Pulse 73   Ht 6' 1 (1.854 m)   Wt (!) 142.6 kg   BMI 41.48 kg/m   GEN- The patient is well appearing, alert and oriented x 3 today.   Neck - no JVD or carotid bruit noted Lungs- Clear to ausculation bilaterally, normal work of breathing Heart- Regular rate and rhythm, no murmurs, rubs or gallops, PMI not laterally displaced Extremities- no clubbing, cyanosis, or edema Skin - no rash or ecchymosis noted   EKG today demonstrates  Vent. rate 73 BPM PR interval 362 ms QRS duration 174 ms QT/QTcB 454/500 ms P-R-T axes * 269 25 Sinus rhythm with 1st degree A-V block Right bundle branch block Abnormal ECG When compared with ECG of 27-May-2024 09:42, PREVIOUS ECG IS PRESENT  Echo 05/18/23 demonstrated   1. Left ventricular ejection fraction, by estimation, is 65 to 70%. The  left ventricle has normal function. Left ventricular endocardial border  not optimally defined to evaluate regional wall motion. Left ventricular  diastolic function could not be  evaluated.   2. Right ventricular systolic function was not well visualized. The right  ventricular size is not well visualized.   3. Left atrial size was mildly dilated.   4. Right atrial size was mildly dilated.   5. The mitral valve is abnormal. No evidence of mitral valve  regurgitation. No evidence of mitral stenosis.   6. The aortic valve was not well visualized. Aortic valve regurgitation  is not visualized.    7. Aortic dilatation noted. There is mild dilatation of the ascending  aorta, measuring 44 mm.   8. The inferior vena cava is dilated in size with >50% respiratory  variability, suggesting right atrial pressure of 8 mmHg.   ASSESSMENT & PLAN CHA2DS2-VASc Score = 4  The patient's score is based upon: CHF History: 0 HTN History: 1 Diabetes History: 1 Stroke History: 0 Vascular Disease History: 1 Age Score: 1 Gender Score: 0       ASSESSMENT AND PLAN: Paroxysmal Atrial Fibrillation (ICD10:  I48.0) The patient's CHA2DS2-VASc score is 4, indicating a 4.8% annual risk of stroke.   S/p DCCV 04/2023. S/p DCCV 08/2023.  Patient is currently in NSR. He is on  Mounjaro which can hopefully help him lose weight and be a candidate for ablation. We discussed procedure in detail and what to expect. I went over the potential long term adverse effects of amiodarone  given patient's age.  High risk medication monitoring (ICD10: J342684) Patient requires ongoing monitoring for anti-arrhythmic medication which has the potential to cause life threatening arrhythmias or AV block. Qtc stable. Continue amiodarone  200 mg daily.  Secondary Hypercoagulable State (ICD10:  D68.69) The patient is at significant risk for stroke/thromboembolism based upon his CHA2DS2-VASc Score of 4.  Continue Apixaban  (Eliquis ).  No missed doses.     Follow up 6 months with Dr. Inocencio.   Terra Pac, PA-C  [facility] [address] [place], [place] [address] [phone]

## 2024-06-21 ENCOUNTER — Ambulatory Visit (HOSPITAL_COMMUNITY): Payer: Self-pay | Admitting: Internal Medicine

## 2024-06-21 DIAGNOSIS — S51002A Unspecified open wound of left elbow, initial encounter: Secondary | ICD-10-CM | POA: Diagnosis not present

## 2024-06-21 DIAGNOSIS — F17290 Nicotine dependence, other tobacco product, uncomplicated: Secondary | ICD-10-CM | POA: Diagnosis not present

## 2024-06-21 DIAGNOSIS — B9561 Methicillin susceptible Staphylococcus aureus infection as the cause of diseases classified elsewhere: Secondary | ICD-10-CM | POA: Diagnosis not present

## 2024-06-21 DIAGNOSIS — M7032 Other bursitis of elbow, left elbow: Secondary | ICD-10-CM | POA: Diagnosis not present

## 2024-06-21 DIAGNOSIS — L089 Local infection of the skin and subcutaneous tissue, unspecified: Secondary | ICD-10-CM | POA: Diagnosis not present

## 2024-06-21 DIAGNOSIS — L98A222 Non-pressure chronic ulcer of left forearm with fat layer exposed: Secondary | ICD-10-CM | POA: Diagnosis not present

## 2024-06-21 LAB — TSH: TSH: 2.3 u[IU]/mL (ref 0.450–4.500)

## 2024-06-21 LAB — COMPREHENSIVE METABOLIC PANEL WITH GFR
ALT: 19 IU/L (ref 0–44)
AST: 13 IU/L (ref 0–40)
Albumin: 4.3 g/dL (ref 3.8–4.8)
Alkaline Phosphatase: 108 IU/L (ref 47–123)
BUN/Creatinine Ratio: 25 — ABNORMAL HIGH (ref 10–24)
BUN: 30 mg/dL — ABNORMAL HIGH (ref 8–27)
Bilirubin Total: 0.4 mg/dL (ref 0.0–1.2)
CO2: 23 mmol/L (ref 20–29)
Calcium: 9.2 mg/dL (ref 8.6–10.2)
Chloride: 98 mmol/L (ref 96–106)
Creatinine, Ser: 1.22 mg/dL (ref 0.76–1.27)
Globulin, Total: 2.3 g/dL (ref 1.5–4.5)
Glucose: 115 mg/dL — ABNORMAL HIGH (ref 70–99)
Potassium: 4.5 mmol/L (ref 3.5–5.2)
Sodium: 138 mmol/L (ref 134–144)
Total Protein: 6.6 g/dL (ref 6.0–8.5)
eGFR: 62 mL/min/1.73 (ref >59)

## 2024-06-28 ENCOUNTER — Telehealth: Payer: Self-pay | Admitting: Cardiovascular Disease

## 2024-06-28 DIAGNOSIS — S51002D Unspecified open wound of left elbow, subsequent encounter: Secondary | ICD-10-CM | POA: Diagnosis not present

## 2024-06-28 DIAGNOSIS — L98A222 Non-pressure chronic ulcer of left forearm with fat layer exposed: Secondary | ICD-10-CM | POA: Diagnosis not present

## 2024-06-28 DIAGNOSIS — M7032 Other bursitis of elbow, left elbow: Secondary | ICD-10-CM | POA: Diagnosis not present

## 2024-06-28 DIAGNOSIS — F17290 Nicotine dependence, other tobacco product, uncomplicated: Secondary | ICD-10-CM | POA: Diagnosis not present

## 2024-06-28 MED ORDER — DILTIAZEM HCL ER COATED BEADS 300 MG PO CP24: 300.0000 mg | ORAL_CAPSULE | Freq: Every day | ORAL | 3 refills | Status: AC

## 2024-06-28 NOTE — Telephone Encounter (Signed)
 Pts wife calling in regarding her husbands low heart rate and high BP.    \STAT if HR is under 50 or over 120 (normal HR is 60-100 beats per minute)  What is your heart rate? 48  Do you have a log of your heart rate readings (document readings)? No   Do you have any other symptoms? Dizzy    Pt was seen by wound clinic for MRSA this morning and had a low HR. Wound specialist was very concerned and advised he see his cardiologist. Please advise.  Call transferred.

## 2024-06-28 NOTE — Addendum Note (Signed)
 Addended by: FRANCHOT GLADE RAMAN on: 06/28/2024 10:31 AM   Modules accepted: Orders

## 2024-06-28 NOTE — Telephone Encounter (Signed)
 Confirmed with patient he is taking amiodarone  200mg  daily and Cardizem  360mg  daily. Discussed with Thom Heinrich PA will decrease cardizem  to 300mg  daily. Call with update of response next week. Pt in agreement.

## 2024-06-28 NOTE — Telephone Encounter (Signed)
 Call transferred from operator for low HR. Call was from Dagoberto Barefoot, Patient's partner. Unfortunately, DPR was expired from 2019, and also patient was not present with Dagoberto. Dagoberto reports that he is currently at wound care and his wound care specialist told him to follow up with his cardiologist because a low HR was noticed. Dagoberto states patient asked her to call our office because patient did not want to sit on hold but is also still at his wound care appointment. Patient recently had visit with afib clinic and was started on amiodarone .   Called patient's cell phone (which is listed on DPR) and left detailed message asking patient to call back. Also provided direct # for call back. [facility] sent.

## 2024-07-05 DIAGNOSIS — M7032 Other bursitis of elbow, left elbow: Secondary | ICD-10-CM | POA: Diagnosis not present

## 2024-07-05 DIAGNOSIS — L98A222 Non-pressure chronic ulcer of left forearm with fat layer exposed: Secondary | ICD-10-CM | POA: Diagnosis not present

## 2024-07-05 DIAGNOSIS — F17290 Nicotine dependence, other tobacco product, uncomplicated: Secondary | ICD-10-CM | POA: Diagnosis not present

## 2024-07-05 DIAGNOSIS — S51002D Unspecified open wound of left elbow, subsequent encounter: Secondary | ICD-10-CM | POA: Diagnosis not present

## 2024-07-05 NOTE — Telephone Encounter (Signed)
 Patient called with update. HRs in the 68-75 range majority of the time. He did have a heart rate of 48 at the wound center yesterday but had no symptoms. He will continue to follow his heart rates and if sustained bradycardia or develop symptoms he will notify the office. Pt in agreement.

## 2024-07-12 DIAGNOSIS — M7032 Other bursitis of elbow, left elbow: Secondary | ICD-10-CM | POA: Diagnosis not present

## 2024-07-12 DIAGNOSIS — L98A192 Non-pressure chronic ulcer of unspecified upper arm with fat layer exposed: Secondary | ICD-10-CM | POA: Diagnosis not present

## 2024-07-12 DIAGNOSIS — F17290 Nicotine dependence, other tobacco product, uncomplicated: Secondary | ICD-10-CM | POA: Diagnosis not present

## 2024-07-12 DIAGNOSIS — L98A222 Non-pressure chronic ulcer of left forearm with fat layer exposed: Secondary | ICD-10-CM | POA: Diagnosis not present

## 2024-07-19 DIAGNOSIS — L98A192 Non-pressure chronic ulcer of unspecified upper arm with fat layer exposed: Secondary | ICD-10-CM | POA: Diagnosis not present

## 2024-07-19 DIAGNOSIS — F17290 Nicotine dependence, other tobacco product, uncomplicated: Secondary | ICD-10-CM | POA: Diagnosis not present

## 2024-07-19 DIAGNOSIS — M7032 Other bursitis of elbow, left elbow: Secondary | ICD-10-CM | POA: Diagnosis not present

## 2024-07-19 DIAGNOSIS — L98A222 Non-pressure chronic ulcer of left forearm with fat layer exposed: Secondary | ICD-10-CM | POA: Diagnosis not present

## 2024-08-03 DIAGNOSIS — M170 Bilateral primary osteoarthritis of knee: Secondary | ICD-10-CM | POA: Diagnosis not present

## 2024-08-09 DIAGNOSIS — I4891 Unspecified atrial fibrillation: Secondary | ICD-10-CM | POA: Diagnosis not present

## 2024-08-09 DIAGNOSIS — L97822 Non-pressure chronic ulcer of other part of left lower leg with fat layer exposed: Secondary | ICD-10-CM | POA: Diagnosis not present

## 2024-08-09 DIAGNOSIS — F17290 Nicotine dependence, other tobacco product, uncomplicated: Secondary | ICD-10-CM | POA: Diagnosis not present

## 2024-08-09 DIAGNOSIS — I872 Venous insufficiency (chronic) (peripheral): Secondary | ICD-10-CM | POA: Diagnosis not present

## 2024-08-09 DIAGNOSIS — R600 Localized edema: Secondary | ICD-10-CM | POA: Diagnosis not present

## 2024-08-09 DIAGNOSIS — L98492 Non-pressure chronic ulcer of skin of other sites with fat layer exposed: Secondary | ICD-10-CM | POA: Diagnosis not present

## 2024-08-09 DIAGNOSIS — E11622 Type 2 diabetes mellitus with other skin ulcer: Secondary | ICD-10-CM | POA: Diagnosis not present

## 2024-08-23 ENCOUNTER — Other Ambulatory Visit: Payer: Self-pay | Admitting: Cardiovascular Disease

## 2024-08-23 DIAGNOSIS — L97922 Non-pressure chronic ulcer of unspecified part of left lower leg with fat layer exposed: Secondary | ICD-10-CM | POA: Diagnosis not present

## 2024-10-09 ENCOUNTER — Other Ambulatory Visit: Payer: Self-pay | Admitting: Cardiovascular Disease

## 2024-10-10 ENCOUNTER — Telehealth: Payer: Self-pay | Admitting: Cardiovascular Disease

## 2024-10-10 NOTE — Telephone Encounter (Signed)
" °*  STAT* If patient is at the pharmacy, call can be transferred to refill team.   1. Which medications need to be refilled? (please list name of each medication and dose if known)  icosapent  Ethyl (VASCEPA ) 1 g capsule     2. Would you like to learn more about the convenience, safety, & potential cost savings by using the [facility]? No    3. Are you open to using the [facility] (Type [facility]. No    4. Which pharmacy/location (including street and city if local pharmacy) is medication to be sent to?  [id number] - [place], [place] - [address] AT [facility] OF [address]    5. Do they need a 30 day or 90 day supply? 90 day   "

## 2024-10-13 ENCOUNTER — Ambulatory Visit: Admitting: Cardiology

## 2024-10-16 NOTE — Telephone Encounter (Signed)
 Patient has appointment in February. Patient will need lipid panel ordered at this visit.

## 2024-10-17 ENCOUNTER — Other Ambulatory Visit: Payer: Self-pay | Admitting: Cardiovascular Disease

## 2024-10-17 NOTE — Telephone Encounter (Signed)
 Refill was sent 10/16/24.

## 2024-11-16 ENCOUNTER — Ambulatory Visit: Admitting: Cardiology

## 2024-12-14 ENCOUNTER — Ambulatory Visit: Admitting: Cardiology
